# Patient Record
Sex: Female | Born: 1940
Health system: Southern US, Community
[De-identification: ages and names within clinical notes are randomized; demographics above are authoritative.]

## PROBLEM LIST (undated history)

## (undated) ENCOUNTER — Emergency Department (HOSPITAL_COMMUNITY): Admission: EM | Payer: Commercial Managed Care - HMO | Source: Home / Self Care

## (undated) DIAGNOSIS — J449 Chronic obstructive pulmonary disease, unspecified: Secondary | ICD-10-CM

## (undated) DIAGNOSIS — N183 Chronic kidney disease, stage 3 unspecified: Secondary | ICD-10-CM

## (undated) DIAGNOSIS — I482 Chronic atrial fibrillation, unspecified: Secondary | ICD-10-CM

## (undated) DIAGNOSIS — T7840XA Allergy, unspecified, initial encounter: Secondary | ICD-10-CM

## (undated) DIAGNOSIS — T4145XA Adverse effect of unspecified anesthetic, initial encounter: Secondary | ICD-10-CM

## (undated) DIAGNOSIS — J45909 Unspecified asthma, uncomplicated: Secondary | ICD-10-CM

## (undated) DIAGNOSIS — M1990 Unspecified osteoarthritis, unspecified site: Secondary | ICD-10-CM

## (undated) DIAGNOSIS — I10 Essential (primary) hypertension: Secondary | ICD-10-CM

## (undated) DIAGNOSIS — Z8719 Personal history of other diseases of the digestive system: Secondary | ICD-10-CM

## (undated) DIAGNOSIS — E785 Hyperlipidemia, unspecified: Secondary | ICD-10-CM

## (undated) DIAGNOSIS — R011 Cardiac murmur, unspecified: Secondary | ICD-10-CM

## (undated) DIAGNOSIS — K219 Gastro-esophageal reflux disease without esophagitis: Secondary | ICD-10-CM

## (undated) DIAGNOSIS — I700 Atherosclerosis of aorta: Secondary | ICD-10-CM

## (undated) DIAGNOSIS — T8859XA Other complications of anesthesia, initial encounter: Secondary | ICD-10-CM

## (undated) DIAGNOSIS — E162 Hypoglycemia, unspecified: Secondary | ICD-10-CM

## (undated) HISTORY — DX: Allergy, unspecified, initial encounter: T78.40XA

## (undated) HISTORY — DX: Unspecified asthma, uncomplicated: J45.909

## (undated) HISTORY — DX: Chronic obstructive pulmonary disease, unspecified: J44.9

## (undated) HISTORY — DX: Hyperlipidemia, unspecified: E78.5

## (undated) HISTORY — DX: Chronic atrial fibrillation, unspecified: I48.20

## (undated) HISTORY — DX: Atherosclerosis of aorta: I70.0

## (undated) HISTORY — DX: Chronic kidney disease, stage 3 (moderate): N18.3

## (undated) HISTORY — DX: Chronic kidney disease, stage 3 unspecified: N18.30

## (undated) HISTORY — DX: Hypoglycemia, unspecified: E16.2

## (undated) HISTORY — DX: Essential (primary) hypertension: I10

## (undated) HISTORY — PX: TUBAL LIGATION: SHX77

## (undated) HISTORY — PX: CHOLECYSTECTOMY OPEN: SUR202

## (undated) HISTORY — PX: TONSILLECTOMY: SUR1361

---

## 2004-12-26 ENCOUNTER — Encounter: Payer: Self-pay | Admitting: Family Medicine

## 2004-12-26 LAB — CONVERTED CEMR LAB: BUN: 16 mg/dL

## 2004-12-30 ENCOUNTER — Encounter: Payer: Self-pay | Admitting: Family Medicine

## 2007-07-05 ENCOUNTER — Ambulatory Visit: Payer: Self-pay | Admitting: Family Medicine

## 2007-07-05 ENCOUNTER — Encounter: Admission: RE | Admit: 2007-07-05 | Discharge: 2007-07-05 | Payer: Self-pay | Admitting: Family Medicine

## 2007-07-05 DIAGNOSIS — I10 Essential (primary) hypertension: Secondary | ICD-10-CM | POA: Insufficient documentation

## 2007-07-05 DIAGNOSIS — E785 Hyperlipidemia, unspecified: Secondary | ICD-10-CM | POA: Insufficient documentation

## 2007-07-05 DIAGNOSIS — E039 Hypothyroidism, unspecified: Secondary | ICD-10-CM | POA: Insufficient documentation

## 2007-07-06 ENCOUNTER — Telehealth (INDEPENDENT_AMBULATORY_CARE_PROVIDER_SITE_OTHER): Payer: Self-pay | Admitting: *Deleted

## 2007-07-06 ENCOUNTER — Encounter: Payer: Self-pay | Admitting: Family Medicine

## 2007-07-06 LAB — CONVERTED CEMR LAB
ALT: 11 units/L (ref 0–35)
AST: 15 units/L (ref 0–37)
Alkaline Phosphatase: 73 units/L (ref 39–117)
CO2: 21 meq/L (ref 19–32)
Cholesterol: 209 mg/dL — ABNORMAL HIGH (ref 0–200)
Creatinine, Ser: 0.84 mg/dL (ref 0.40–1.20)
HCT: 42.4 % (ref 36.0–46.0)
LDL Cholesterol: 130 mg/dL — ABNORMAL HIGH (ref 0–99)
MCHC: 32.8 g/dL (ref 30.0–36.0)
MCV: 85.8 fL (ref 78.0–100.0)
Platelets: 249 10*3/uL (ref 150–400)
RDW: 13.5 % (ref 11.5–14.0)
Sodium: 143 meq/L (ref 135–145)
Total Bilirubin: 0.5 mg/dL (ref 0.3–1.2)
Total CHOL/HDL Ratio: 4
Total Protein: 6.9 g/dL (ref 6.0–8.3)
VLDL: 27 mg/dL (ref 0–40)

## 2007-07-07 ENCOUNTER — Encounter: Payer: Self-pay | Admitting: Family Medicine

## 2007-07-10 ENCOUNTER — Encounter: Payer: Self-pay | Admitting: Family Medicine

## 2007-07-14 ENCOUNTER — Ambulatory Visit: Payer: Self-pay

## 2007-07-19 ENCOUNTER — Encounter: Payer: Self-pay | Admitting: Family Medicine

## 2007-08-03 ENCOUNTER — Encounter: Payer: Self-pay | Admitting: Family Medicine

## 2007-08-03 DIAGNOSIS — J45909 Unspecified asthma, uncomplicated: Secondary | ICD-10-CM

## 2007-08-03 DIAGNOSIS — K589 Irritable bowel syndrome without diarrhea: Secondary | ICD-10-CM

## 2008-02-15 ENCOUNTER — Encounter: Payer: Self-pay | Admitting: Family Medicine

## 2008-02-15 ENCOUNTER — Ambulatory Visit: Payer: Self-pay | Admitting: Family Medicine

## 2008-02-15 ENCOUNTER — Other Ambulatory Visit: Admission: RE | Admit: 2008-02-15 | Discharge: 2008-02-15 | Payer: Self-pay | Admitting: Family Medicine

## 2008-02-15 DIAGNOSIS — I6529 Occlusion and stenosis of unspecified carotid artery: Secondary | ICD-10-CM

## 2008-02-18 ENCOUNTER — Ambulatory Visit: Payer: Self-pay | Admitting: Family Medicine

## 2008-02-18 DIAGNOSIS — L919 Hypertrophic disorder of the skin, unspecified: Secondary | ICD-10-CM

## 2008-02-18 DIAGNOSIS — L909 Atrophic disorder of skin, unspecified: Secondary | ICD-10-CM | POA: Insufficient documentation

## 2008-02-18 DIAGNOSIS — L821 Other seborrheic keratosis: Secondary | ICD-10-CM

## 2008-02-29 ENCOUNTER — Encounter: Payer: Self-pay | Admitting: Family Medicine

## 2008-02-29 ENCOUNTER — Encounter: Admission: RE | Admit: 2008-02-29 | Discharge: 2008-02-29 | Payer: Self-pay | Admitting: Family Medicine

## 2008-03-02 ENCOUNTER — Encounter: Payer: Self-pay | Admitting: Family Medicine

## 2008-03-08 ENCOUNTER — Telehealth (INDEPENDENT_AMBULATORY_CARE_PROVIDER_SITE_OTHER): Payer: Self-pay | Admitting: *Deleted

## 2008-03-15 ENCOUNTER — Ambulatory Visit: Payer: Self-pay | Admitting: Cardiology

## 2008-03-16 ENCOUNTER — Encounter: Payer: Self-pay | Admitting: Family Medicine

## 2008-03-22 ENCOUNTER — Encounter: Payer: Self-pay | Admitting: Family Medicine

## 2008-04-10 ENCOUNTER — Encounter: Payer: Self-pay | Admitting: Family Medicine

## 2008-04-10 DIAGNOSIS — R0989 Other specified symptoms and signs involving the circulatory and respiratory systems: Secondary | ICD-10-CM

## 2008-04-10 DIAGNOSIS — R0609 Other forms of dyspnea: Secondary | ICD-10-CM

## 2009-03-07 ENCOUNTER — Ambulatory Visit: Payer: Self-pay | Admitting: Family Medicine

## 2009-03-28 ENCOUNTER — Telehealth (INDEPENDENT_AMBULATORY_CARE_PROVIDER_SITE_OTHER): Payer: Self-pay | Admitting: *Deleted

## 2009-03-28 ENCOUNTER — Ambulatory Visit: Payer: Self-pay | Admitting: Family Medicine

## 2009-03-29 LAB — CONVERTED CEMR LAB
Albumin: 4.4 g/dL (ref 3.5–5.2)
Alkaline Phosphatase: 72 units/L (ref 39–117)
BUN: 19 mg/dL (ref 6–23)
Calcium: 9.9 mg/dL (ref 8.4–10.5)
Creatinine, Ser: 0.86 mg/dL (ref 0.40–1.20)
Glucose, Bld: 100 mg/dL — ABNORMAL HIGH (ref 70–99)
HDL: 51 mg/dL (ref >39)
LDL Cholesterol: 101 mg/dL — ABNORMAL HIGH (ref 0–99)
Potassium: 4.4 meq/L (ref 3.5–5.3)
Total CHOL/HDL Ratio: 3.7
Triglycerides: 182 mg/dL — ABNORMAL HIGH (ref <150)

## 2009-06-04 ENCOUNTER — Telehealth (INDEPENDENT_AMBULATORY_CARE_PROVIDER_SITE_OTHER): Payer: Self-pay | Admitting: *Deleted

## 2009-06-05 ENCOUNTER — Telehealth: Payer: Self-pay | Admitting: Family Medicine

## 2009-10-31 ENCOUNTER — Telehealth (INDEPENDENT_AMBULATORY_CARE_PROVIDER_SITE_OTHER): Payer: Self-pay | Admitting: *Deleted

## 2010-04-03 ENCOUNTER — Other Ambulatory Visit: Admission: RE | Admit: 2010-04-03 | Discharge: 2010-04-03 | Payer: Self-pay | Admitting: Family Medicine

## 2010-04-03 ENCOUNTER — Ambulatory Visit: Payer: Self-pay | Admitting: Family Medicine

## 2010-04-03 DIAGNOSIS — M8580 Other specified disorders of bone density and structure, unspecified site: Secondary | ICD-10-CM | POA: Insufficient documentation

## 2010-04-03 DIAGNOSIS — M949 Disorder of cartilage, unspecified: Secondary | ICD-10-CM

## 2010-04-03 DIAGNOSIS — M549 Dorsalgia, unspecified: Secondary | ICD-10-CM

## 2010-04-03 DIAGNOSIS — M899 Disorder of bone, unspecified: Secondary | ICD-10-CM | POA: Insufficient documentation

## 2010-04-04 LAB — CONVERTED CEMR LAB
BUN: 19 mg/dL (ref 6–23)
CO2: 25 meq/L (ref 19–32)
Calcium: 9.4 mg/dL (ref 8.4–10.5)
Chloride: 106 meq/L (ref 96–112)
Cholesterol: 184 mg/dL (ref 0–200)
Creatinine, Ser: 0.98 mg/dL (ref 0.40–1.20)
HDL: 51 mg/dL (ref >39)
Total CHOL/HDL Ratio: 3.6

## 2010-05-15 ENCOUNTER — Ambulatory Visit: Payer: Self-pay

## 2010-05-15 ENCOUNTER — Ambulatory Visit: Payer: Self-pay | Admitting: Cardiovascular Disease

## 2010-05-15 ENCOUNTER — Ambulatory Visit (HOSPITAL_COMMUNITY): Admission: RE | Admit: 2010-05-15 | Discharge: 2010-05-15 | Payer: Self-pay | Admitting: Family Medicine

## 2011-01-22 NOTE — Assessment & Plan Note (Signed)
Summary: CPE with pap   Vital Signs:  Patient profile:   70 year old female Height:      63.25 inches Weight:      172 pounds BMI:     30.34 O2 Sat:      98 % on Room air Pulse rate:   72 / minute BP sitting:   134 / 72  (left arm) Cuff size:   regular  Vitals Entered By: Payton Spark CMA (April 03, 2010 9:33 AM)  O2 Flow:  Room air CC: CPE w/ pap   Primary Care Provider:  Seymour Bars D.O.  CC:  CPE w/ pap.  History of Present Illness: 70 yo WF presents for CPE with pap smear.    She is G5P3, postmenopausal since age 69 s/p 8 yrs of HRT.  She has hx of hypothyroidism, off meds and due for TSH testing.  Hx of HTN, high cholesterol, GERD and chronic back pain.  She has allergies also.  Due for fasting labs and Med RFs today.  Had a colonooscopy in 03, normal but she refuses repeat due to having a tortuous colon and a painful colonoscopy in [place].  She is divorced and working 3 jobs.  She has 'no time' to exercise.  She is due for mammogram and DEXA scan.  She adamantly refuses PNX.  Her Tdap is UTD.     Current Medications (verified): 1)  Amlodipine Besylate 5 Mg  Tabs (Amlodipine Besylate) .... Take 1 Tablet By Mouth Once A Day 2)  Lovastatin 20 Mg  Tabs (Lovastatin) .... Take 1 Tablet By Mouth Once A Day 3)  Omeprazole 20 Mg  Cpdr (Omeprazole) .... Take 1 Tablet By Mouth Once A Day 4)  Mobic 7.5 Mg  Tabs (Meloxicam) .Marland Kitchen.. 1-2 Tabs By Mouth Daily As Needed For Back Pain 5)  Enalapril Maleate 20 Mg Tabs (Enalapril Maleate) .Marland Kitchen.. 1 Tab By Mouth Daily  Allergies (verified): 1)  ! Sulfa 2)  Celebrex (Celecoxib) 3)  Zyrtec Allergy (Cetirizine Hcl)  Past History:  Past Medical History: Reviewed history from 03/22/2008 and no changes required. hypothyroidism  hypertension hyperlipidemia gerd chronic low back pain G5 P3; menopausal since 55; was on HRT for 8 years allergies to cats, dust, mold, grass, trees, flowers, wool, dogs, smoking, sulfa PFTs 2001: FEV1 108% predicted  and FVC 65%; minimal airway obstruction colonoscopy/EGD 8/03 Dr Ilda Mori  [facility]) bicornate uterus  Past Surgical History: Reviewed history from 08/03/2007 and no changes required. cholecystectomy 2001 tonsillectomy childhood  Family History: Reviewed history from 08/03/2007 and no changes required. mother died in her 23s; CAD CHF and valvular disease father died from renal failure after a hip fracture, colon cancer two brothers healthy  Social History: Reviewed history from 03/07/2009 and no changes required. retired Armed forces training and education officer, working at [employer].   divorced.  Not in a relationship.  Three grown sons one here in [place]. Two other sons in [place], [place].  Four grandkids.   never smoked .occasional alcohol  no exercise  Review of Systems       The patient complains of weight gain and dyspnea on exertion.  The patient denies anorexia, fever, weight loss, vision loss, decreased hearing, hoarseness, chest pain, syncope, peripheral edema, prolonged cough, headaches, hemoptysis, abdominal pain, melena, hematochezia, severe indigestion/heartburn, hematuria, incontinence, genital sores, muscle weakness, suspicious skin lesions, transient blindness, difficulty walking, depression, unusual weight change, abnormal bleeding, enlarged lymph nodes, angioedema, breast masses, and testicular masses.    Physical Exam  General:  alert, well-developed, well-nourished, well-hydrated, and overweight-appearing.   Head:  normocephalic and atraumatic.   Eyes:  pupils equal, pupils round, and pupils reactive to light.  wears glasses Ears:  EACs patent; TMs translucent and gray with good cone of light and bony landmarks.  Nose:  no nasal discharge.   Mouth:  good dentition and pharynx pink and moist.   Neck:  no masses.  no audible carotid bruits Breasts:  No mass, nodules, thickening, tenderness, bulging, retraction, inflamation, nipple discharge or skin changes noted.   Lungs:  Normal  respiratory effort, chest expands symmetrically. Lungs are clear to auscultation, no crackles or wheezes. Heart:  Normal rate and regular rhythm. S1 and S2 normal without gallop, murmur, click, rub or other extra sounds. Abdomen:  Bowel sounds positive,abdomen soft and non-tender without masses, organomegaly or hernias noted. Genitalia:  Pelvic Exam:        External: normal female genitalia without lesions or masses        Vagina: normal without lesions or masses        Cervix: normal without lesions or masses        Adnexa: normal bimanual exam without masses or fullness        Uterus: normal by palpation        Pap smear: performed Msk:  no joint tenderness, no joint swelling, no joint warmth, and no redness over joints.   Pulses:  2+ radial and pedal pulses Extremities:  no LE edema Skin:  color normal and no suspicious lesions.   Cervical Nodes:  No lymphadenopathy noted Psych:  good eye contact, not anxious appearing, and not depressed appearing.     Impression & Recommendations:  Problem # 1:  ROUTINE GYNECOLOGICAL EXAMINATION (ICD-V72.31) Thin prep pap done. BP at goal.  BMI >30, into class I obesity. Work on [employer], regular exercise.  Tdap UTD.  Declined pneumovax. Colonsocpy due 2013. Update labs, mammogram and DEXA.  Problem # 2:  DYSPNEA ON EXERTION (ICD-786.09) She c/o DOE when running up 1 flight of stairs but not at any other time.  this is likely to be due to deconditioning.  Her last stress test was in 04 and her risk factors include HTN, high chol and obesity and fam hx. Her updated medication list for this problem includes:    Enalapril Maleate 20 Mg Tabs (Enalapril maleate) .Marland Kitchen... 1 tab by mouth daily  Orders: T-Stress 2-D Echo (60454)  Problem # 3:  BACK PAIN (ICD-724.5) Mobic no longer working so will change her to Etodolac to try. Her back pain is worsened by prolonged standing, lack of exercise and no stretching.  Advised shoe inserts for comfort,  thermacare heat wraps during the day and yoga stretches at night. The following medications were removed from the medication list:    Mobic 7.5 Mg Tabs (Meloxicam) .Marland Kitchen... 1-2 tabs by mouth daily as needed for back pain Her updated medication list for this problem includes:    Etodolac 500 Mg Tabs (Etodolac) .Marland Kitchen... 1 tab by mouth two times a day with food  Complete Medication List: 1)  Amlodipine Besylate 5 Mg Tabs (Amlodipine besylate) .... Take 1 tablet by mouth once a day 2)  Lovastatin 20 Mg Tabs (Lovastatin) .... Take 1 tablet by mouth once a day 3)  Omeprazole 20 Mg Cpdr (Omeprazole) .... Take 1 tablet by mouth once a day 4)  Enalapril Maleate 20 Mg Tabs (Enalapril maleate) .Marland Kitchen.. 1 tab by mouth daily 5)  Loratadine 10 Mg Tabs (Loratadine) .Marland KitchenMarland KitchenMarland Kitchen  Take 1 tab by mouth once daily 6)  Etodolac 500 Mg Tabs (Etodolac) .Marland Kitchen.. 1 tab by mouth two times a day with food  Other Orders: T-Mammography Bilateral Screening (13244) T-Dual DXA Bone Density/ Axial (01027) T-DXA Bone Density/ Appendicular (25366) T-Comprehensive Metabolic Panel [phone]) T-Lipid Profile [phone] T-TSH [phone] T-Vitamin D (25-Hydroxy) (06301-60109)  Patient Instructions: 1)  Meds RFd. 2)  Update labs today. 3)  Will call you w/ results tomorrow. 4)  Will call you w/ pap results by Monday. 5)  Update mammogram and DEXA scan downstairs. 6)  Work on [employer], regular exercise. 7)  Change Ibuprofen and Meloxicam to Etodolac for back pain. 8)  Yoga stretches at night would also be very helpful. 9)  Return for f/u HTN in 6 mos. Prescriptions: ETODOLAC 500 MG TABS (ETODOLAC) 1 tab by mouth two times a day with food  #180 x 0   Entered and Authorized by:   Seymour Bars DO   Signed by:   Seymour Bars DO on 04/03/2010   Method used:   Printed then faxed to ...       [facility]* (retail)       [address] [place], [place]  [address]       Ph: [phone]       Fax: [phone]   RxID:    [id number] ENALAPRIL MALEATE 20 MG TABS (ENALAPRIL MALEATE) 1 tab by mouth daily  #90 x 2   Entered and Authorized by:   Seymour Bars DO   Signed by:   Seymour Bars DO on 04/03/2010   Method used:   Printed then faxed to ...       [facility]* (retail)       [address] [place], [place]  [address]       Ph: [phone]       Fax: [phone]   RxID:   [id number] OMEPRAZOLE 20 MG  CPDR (OMEPRAZOLE) Take 1 tablet by mouth once a day  #90 x 3   Entered and Authorized by:   Seymour Bars DO   Signed by:   Seymour Bars DO on 04/03/2010   Method used:   Printed then faxed to ...       [facility]* (retail)       [address] [place], [place]  [address]       Ph: [phone]       Fax: [phone]   RxID:   [id number] LOVASTATIN 20 MG  TABS (LOVASTATIN) Take 1 tablet by mouth once a day  #90 x 1   Entered and Authorized by:   Seymour Bars DO   Signed by:   Seymour Bars DO on 04/03/2010   Method used:   Printed then faxed to ...       [facility]* (retail)       [address] [place], [place]  [address]       Ph: [phone]       Fax: [phone]   RxID:   [id number] AMLODIPINE BESYLATE 5 MG  TABS (AMLODIPINE BESYLATE) Take 1 tablet by mouth once a day  #90 x 3   Entered and Authorized by:   Seymour Bars DO   Signed by:   Seymour Bars DO on 04/03/2010   Method used:   Printed then faxed to .Marland KitchenMarland Kitchen  [facility]* (retail)       [address] [place], [place]  [address]       Ph: [phone]       Fax: [phone]   RxID:   [id number]

## 2011-04-25 ENCOUNTER — Telehealth: Payer: Self-pay | Admitting: Family Medicine

## 2011-04-25 NOTE — Telephone Encounter (Signed)
LMOM informing Pt that she is due for OV

## 2011-04-25 NOTE — Telephone Encounter (Signed)
Pt called and stated [facility] had sent over a refill request on omeprazole but our office didn't respond therefore they cancelled the order.  Pt needs med.  Can we do today?  Looks like last OV 03-06-11 for her diabetes.  Last RF 08-28-2009 #60/4rfs Plan:  Routed to Dr. Cathey Endow for authorization Jarvis Newcomer, LPN Domingo Dimes

## 2011-04-30 ENCOUNTER — Encounter: Payer: Self-pay | Admitting: Family Medicine

## 2011-04-30 ENCOUNTER — Ambulatory Visit (INDEPENDENT_AMBULATORY_CARE_PROVIDER_SITE_OTHER): Payer: Self-pay | Admitting: Family Medicine

## 2011-04-30 DIAGNOSIS — I499 Cardiac arrhythmia, unspecified: Secondary | ICD-10-CM

## 2011-04-30 DIAGNOSIS — E039 Hypothyroidism, unspecified: Secondary | ICD-10-CM

## 2011-04-30 DIAGNOSIS — I10 Essential (primary) hypertension: Secondary | ICD-10-CM

## 2011-04-30 DIAGNOSIS — E785 Hyperlipidemia, unspecified: Secondary | ICD-10-CM

## 2011-04-30 DIAGNOSIS — Z13220 Encounter for screening for lipoid disorders: Secondary | ICD-10-CM

## 2011-04-30 LAB — LIPID PANEL
HDL: 50 mg/dL (ref >39)
Total CHOL/HDL Ratio: 3.9 Ratio
Triglycerides: 191 mg/dL — ABNORMAL HIGH (ref <150)
VLDL: 38 mg/dL (ref 0–40)

## 2011-04-30 LAB — CBC WITH DIFFERENTIAL/PLATELET
Basophils Absolute: 0.1 10*3/uL (ref 0.0–0.1)
Basophils Relative: 1 % (ref 0–1)
HCT: 40.9 % (ref 36.0–46.0)
MCHC: 33.7 g/dL (ref 30.0–36.0)
Monocytes Absolute: 0.5 10*3/uL (ref 0.1–1.0)
Neutro Abs: 4.1 10*3/uL (ref 1.7–7.7)
RDW: 14.4 % (ref 11.5–15.5)

## 2011-04-30 MED ORDER — OMEPRAZOLE 20 MG PO CPDR: 20.0000 mg | DELAYED_RELEASE_CAPSULE | Freq: Every day | ORAL | Status: DC

## 2011-04-30 MED ORDER — AMLODIPINE BESYLATE 5 MG PO TABS: 5.0000 mg | ORAL_TABLET | Freq: Every day | ORAL | Status: DC

## 2011-04-30 MED ORDER — ENALAPRIL MALEATE 20 MG PO TABS: 20.0000 mg | ORAL_TABLET | Freq: Every day | ORAL | Status: DC

## 2011-04-30 MED ORDER — MELOXICAM 15 MG PO TABS: 15.0000 mg | ORAL_TABLET | Freq: Every day | ORAL | Status: DC

## 2011-04-30 MED ORDER — LOVASTATIN 20 MG PO TABS: 20.0000 mg | ORAL_TABLET | Freq: Every day | ORAL | Status: DC

## 2011-04-30 MED ORDER — MELOXICAM 15 MG PO TABS: 15.0000 mg | ORAL_TABLET | Freq: Every day | ORAL | Status: AC

## 2011-04-30 NOTE — Assessment & Plan Note (Signed)
Check TSH with labs today and f/u results tomorrow.

## 2011-04-30 NOTE — Patient Instructions (Signed)
EKG shows a sinus arrythmia. Will get you set up to see cardiologist.  Update fasting labs today. Stay on current meds.  Will call you w/ lab results tomorrow.  Return for your BP in 4 months.

## 2011-04-30 NOTE — Progress Notes (Signed)
Addended by: Seymour Bars on: 04/30/2011 12:47 PM   Modules accepted: Orders

## 2011-04-30 NOTE — Progress Notes (Signed)
  Subjective:    Patient ID: Rebekah Harrell, female    DOB: 12/28/1940, 70 y.o.   MRN: [id number]  HPI  70 yo WF presents for f/u visit.  She is due for fasting labs today.  Denies chest pain or SOB.  She is working out in the yard and stays active.  On meloxicam for LBP.  She has been monitoring her BP at home and it has been <140/90.  Denies edema or heart palpitations.  Has a fair diet.    BP 156/76  Pulse 72  Ht 5' 3.25" (1.607 m)  Wt 172 lb (78.019 kg)  BMI 30.23 kg/m2  SpO2 96%  Patient Active Problem List  Diagnoses  . HYPOTHYROIDISM NOS  . HYPERLIPIDEMIA NEC/NOS  . HYPERTENSION, BENIGN ESSENTIAL  . CAROTID ARTERY STENOSIS, BILATERAL  . ASTHMA, INTERMITTENT, MILD  . IBS  . SKIN TAG  . SEBORRHEIC KERATOSIS  . BACK PAIN  . OSTEOPENIA  . DYSPNEA ON EXERTION        Review of Systems  Constitutional: Negative for fever, fatigue and unexpected weight change.  Respiratory: Negative for chest tightness and shortness of breath.   Cardiovascular: Negative for chest pain.  Gastrointestinal: Negative for nausea and diarrhea.  Neurological: Negative for dizziness, weakness, light-headedness and headaches.  Psychiatric/Behavioral: Negative for dysphoric mood. The patient is not nervous/anxious.        Objective:   Physical Exam  Eyes: Pupils are equal, round, and reactive to light.  Neck: Neck supple. No JVD present. No thyromegaly present.  Cardiovascular: Normal rate, S1 normal and S2 normal.  An irregular rhythm present.  No murmur heard. Musculoskeletal: She exhibits no edema.  Lymphadenopathy:    She has no cervical adenopathy.  Skin: Skin is warm and dry.  Psychiatric: She has a normal mood and affect.          Assessment & Plan:

## 2011-04-30 NOTE — Assessment & Plan Note (Signed)
BP elevated today on current meds but she reports it to be 'normal' at home.  No changes made today.  Will update fasting labs today, RF meds and have cards see her for arrythmia / BP.

## 2011-04-30 NOTE — Assessment & Plan Note (Signed)
New.  Asymptomatic and hemodynamically stable.  EKG today shows a HR of 69 bpm with premature atrial contractions.  Will get her in with cardiology for eval/ treatment and check electrolytes with labs today.

## 2011-05-01 ENCOUNTER — Telehealth: Payer: Self-pay | Admitting: Family Medicine

## 2011-05-01 LAB — COMPLETE METABOLIC PANEL WITH GFR
Albumin: 4.6 g/dL (ref 3.5–5.2)
CO2: 23 mEq/L (ref 19–32)
Chloride: 105 mEq/L (ref 96–112)
GFR, Est African American: >60 mL/min (ref >60)
GFR, Est Non African American: >60 mL/min (ref >60)
Glucose, Bld: 90 mg/dL (ref 70–99)
Potassium: 4.3 mEq/L (ref 3.5–5.3)
Sodium: 141 mEq/L (ref 135–145)
Total Protein: 7.3 g/dL (ref 6.0–8.3)

## 2011-05-01 NOTE — Telephone Encounter (Signed)
Pls let pt know that her fasting sugar, liver and kidney function came back normal.  Blood counts are normal.  Cholesterol at goal other than elevated TGs at 191 (<150 is goal).  Healthy diet and regular exercise will help get her to goal.  Thyroid function is perfect.  Continue current meds.  Repeat in 1 yr.

## 2011-05-01 NOTE — Telephone Encounter (Signed)
Pt aware of the above  

## 2011-05-06 NOTE — Assessment & Plan Note (Signed)
[facility]                            CARDIOLOGY OFFICE NOTE   MAYDA, SHIPPEE                       MRN:          [id number]  DATE:03/15/2008                            DOB:          [birth year]    Rebekah Harrell is a very pleasant 70 year old female who I am asked to  evaluate for dyspnea, risk factors, palpitations, and cerebrovascular  disease.  Note ,she has no prior cardiac history.  She was told she had  a murmur as a child, but this has not been mentioned in years.  She  typically has dyspnea with more extreme activities but not with routine  activities.  There is no orthopnea, PND, pedal edema, presyncope,  syncope, or exertional chest pain.  She also occasionally feels  palpitations, and this has been going on intermittently for 25 years.  They are sudden in onset and described as her heart racing.  They last  for 10-15 seconds and resolve spontaneously.  There are not associated  with activities, and there is no associated shortness of breath,  presyncope, or syncope and no chest pain.  She also was noted to have a  history of cerebrovascular disease.  Carotid Dopplers on July 14, 2007  showed 40-59% internal carotid artery stenosis on the right as well as  the left.  Follow-up was recommended in one year.   SHE HAD AN ALLERGY TO SULFA.   MEDICATIONS:  Include:  1. Omeprazole 20 mg p.o. daily.  2. Lovastatin 20 mg p.o. daily.  3. Amlodipine 5 mg p.o. daily.  4. Meloxicam 7.5 mg p.o. daily.  5. Calcium plus D.  6. Allergy capsules.  7. Multivitamin.  8. She also takes Flonase and albuterol as needed.   SOCIAL HISTORY:  She does not smoke and only rarely consumes alcohol.   FAMILY HISTORY:  Is negative for coronary disease at an early age in her  immediate family.   PAST MEDICAL HISTORY:  Significant for hypertension and hyperlipidemia.  There is no diabetes mellitus.  She does have seasonal allergies and  asthma.  She has a  history of a hiatal hernia and irritable bowel  syndrome.  She has a history of lower back arthritis.  She has had a  prior cholecystectomy and tonsillectomy.   REVIEW OF SYSTEMS:  She denies any headaches or fevers or chills.  There  is no productive cough or hemoptysis.  There is no dysphagia,  odynophagia, melena or hematochezia.  There is no dysuria, hematuria.  No rash or seizure activity.  There is no orthopnea, PND, or pedal  edema.  There is no claudication noted.  The remainder of systems are  negative.   PHYSICAL EXAMINATION:  Today shows a blood pressure of 148/79 and a  pulse 72.  She weighs 169 pounds.  She is well-developed and well-  nourished in no acute distress.  SKIN:  Is warm, dry.  She does not appear to be depressed, and there is no peripheral  clubbing.  Her HEENT is normal with normal eyelids.  Her neck is supple with no bruits.  There is  no thyromegaly.  No jugular  venous distention is noted.  CHEST:  Is clear to auscultation.  Normal expansion.  CARDIOVASCULAR EXAM:  Reveals a regular rate and rhythm.  Normal S1-S2.  There are no murmurs, rubs, or gallops noted.  ABDOMINAL EXAM:  Nontender.  Nondistended.  Positive bowel sounds.  No  hepatosplenomegaly.  No masses appreciated.  There is no abdominal  bruit.  She has 2+ femoral pulses bilaterally with no bruits.  EXTREMITIES:  Show no edema, and I can palpate no cords.  She has 2+  dorsalis pedis pulses bilaterally.  NEUROLOGIC EXAM:  Grossly intact.   Her electrocardiogram shows a sinus rhythm at a rate of 70.  There are  no significant ST changes.   DIAGNOSES:  1. Dyspnea on exertion - note, her electrocardiogram is normal.  She      does have risk factors of hypertension as well as hyperlipidemia.      She also has mild to moderate cerebrovascular disease bilaterally      on previous carotid Dopplers.  She is also contemplating starting      an exercise program.  I think it would be worthwhile to  proceed      with a stress echocardiogram, both for risk stratification and to      quantify LV function.  If it shows no significant abnormalities,      then she will continue risk factor modification.  2. Palpitations - she has had palpitations intermittently for 20-25      years.  These do not appear to be significantly limiting, and there      are occurring only every several months.  If they worsen in the      future, then we will consider a CardioNet monitor at that time.  3. Cerebrovascular disease - she will need follow-up carotid Dopplers      in July of this year, and I will leave this to Dr. Cathey Endow.  I have      asked her to take enteric-coated aspirin 81 mg p.o. daily given her      documented vascular disease.  She will continue on her statin.  The      goal LDL should be less than 70 given her history of vascular      disease.  4. Hypertension - her blood pressure is minimally elevated today.      This will need to be tracked, and her amlodipine can be increased      as needed.  5. History of hyperlipidemia - she will continue her lovastatin.      Again, her goal LDL should be less than 70, and I will leave these      issues to Dr. Cathey Endow.  6. We will see her back on as-needed basis if her stress      echocardiogram is normal.     Rebekah Harrell. Jens Som, MD, [facility]  Electronically Signed    BSC/MedQ  DD: 03/15/2008  DT: 03/16/2008  Job #: [id number]   cc:   Seymour Bars, D.O.

## 2011-05-21 ENCOUNTER — Encounter: Payer: Self-pay | Admitting: Family Medicine

## 2011-06-24 ENCOUNTER — Telehealth: Payer: Self-pay | Admitting: Family Medicine

## 2011-06-24 NOTE — Telephone Encounter (Signed)
Pls let pt know that her 2D echocardiogram of her heart came back normal.  Valves and pumping function look perfect.

## 2011-06-24 NOTE — Telephone Encounter (Signed)
Pt aware of the above  

## 2012-04-14 ENCOUNTER — Telehealth: Payer: Self-pay | Admitting: *Deleted

## 2012-04-14 NOTE — Telephone Encounter (Signed)
Pt called and states she applied a face cream to her face before bedtime and she woke up with a rash on her face and her eyes were swollen and left eye was swollen shut. Pt states she took several anti histamines throught out the day and states she called her job and they advised her to take Benedryl. Pt wanted to know if there was anything else we could recommend of if there was anything stronger than Benedryl. Advised pt that Benedryl would be first option but she needs to be seen in the office for further eval to make sure nothing else is needed. Denies tongue or throat swelling or difficulty breathing. Pt declines to come in  and states she will try the Benedryl.Advised to call back if rash doesn't go away and if difficulty breathing go to ED

## 2012-04-27 ENCOUNTER — Other Ambulatory Visit: Payer: Self-pay | Admitting: *Deleted

## 2012-04-27 MED ORDER — OMEPRAZOLE 20 MG PO CPDR: 20.0000 mg | DELAYED_RELEASE_CAPSULE | Freq: Every day | ORAL | Status: DC

## 2012-05-03 ENCOUNTER — Ambulatory Visit (INDEPENDENT_AMBULATORY_CARE_PROVIDER_SITE_OTHER): Payer: 59 | Admitting: Physician Assistant

## 2012-05-03 ENCOUNTER — Encounter: Payer: Self-pay | Admitting: Physician Assistant

## 2012-05-03 VITALS — BP 143/84 | HR 86 | Ht 63.25 in | Wt 175.0 lb

## 2012-05-03 DIAGNOSIS — E785 Hyperlipidemia, unspecified: Secondary | ICD-10-CM

## 2012-05-03 DIAGNOSIS — Z79899 Other long term (current) drug therapy: Secondary | ICD-10-CM

## 2012-05-03 DIAGNOSIS — I10 Essential (primary) hypertension: Secondary | ICD-10-CM

## 2012-05-03 DIAGNOSIS — T7840XA Allergy, unspecified, initial encounter: Secondary | ICD-10-CM

## 2012-05-03 DIAGNOSIS — K219 Gastro-esophageal reflux disease without esophagitis: Secondary | ICD-10-CM

## 2012-05-03 NOTE — Patient Instructions (Addendum)
Will get labs and call with results. Will refill medications today. Continue zyrtec daily and benadryl as needed. Consider Allergy testing.

## 2012-05-03 NOTE — Progress Notes (Signed)
  Subjective:    Patient ID: Rebekah Harrell, female    DOB: 28-Jan-1941, 71 y.o.   MRN: [id number]  HPI Patient presents to clinic for medication refill for HTN, Hyperlipidemia, and GERD. Patient has been doing great. She has had a recent allergic reaction to bath and body lotion. She used benadryl and it has improved. She feels like her allergies and allergic response has gotten more sensitive in the last year. Her eyes still remain puffy from contact with cream from bath and body.    Review of Systems     Objective:   Physical Exam  Constitutional: She is oriented to person, place, and time. She appears well-developed and well-nourished.  HENT:  Head: Normocephalic and atraumatic.  Right Ear: External ear normal.  Left Ear: External ear normal.  Nose: Nose normal.  Mouth/Throat: Oropharynx is clear and moist.  Neck: Normal range of motion. Neck supple. No thyromegaly present.  Cardiovascular: Normal rate, regular rhythm and normal heart sounds.   Pulmonary/Chest: Effort normal and breath sounds normal. She has no wheezes.  Lymphadenopathy:    She has no cervical adenopathy.  Neurological: She is alert and oriented to person, place, and time.  Skin: Skin is warm and dry.  Psychiatric: She has a normal mood and affect. Her behavior is normal.          Assessment & Plan:  Hyperlipidemia/HTN/GERD- Rechecked blood pressure and was 130/69. Sent for labs for management of medications will call with results. [facility] once get lab results. Will switch statin due to formulary change with ins.  Allergic reaction- Encouraged patient to continue to use bendaryl for all allergic reactions. May consider going on daily zyrtec or claritin. May need allergy testing to see if allergy shots would benefit her. Educated patient to call office or go to ED if allergic reactions causes SOB or problems swallowing. Offered allergic eye drops but patient did not want.

## 2012-05-04 LAB — CBC WITH DIFFERENTIAL/PLATELET
Basophils Relative: 1 % (ref 0–1)
Eosinophils Absolute: 0.4 10*3/uL (ref 0.0–0.7)
Eosinophils Relative: 5 % (ref 0–5)
HCT: 41.2 % (ref 36.0–46.0)
Hemoglobin: 13.6 g/dL (ref 12.0–15.0)
MCH: 28.1 pg (ref 26.0–34.0)
MCHC: 33 g/dL (ref 30.0–36.0)
MCV: 85.1 fL (ref 78.0–100.0)
Monocytes Absolute: 0.5 10*3/uL (ref 0.1–1.0)
Monocytes Relative: 6 % (ref 3–12)
Neutro Abs: 5 10*3/uL (ref 1.7–7.7)
RDW: 13.6 % (ref 11.5–15.5)

## 2012-05-04 LAB — COMPLETE METABOLIC PANEL WITH GFR
ALT: 20 U/L (ref 0–35)
AST: 28 U/L (ref 0–37)
Albumin: 4.3 g/dL (ref 3.5–5.2)
Alkaline Phosphatase: 75 U/L (ref 39–117)
Potassium: 4.1 mEq/L (ref 3.5–5.3)
Sodium: 142 mEq/L (ref 135–145)
Total Bilirubin: 0.4 mg/dL (ref 0.3–1.2)
Total Protein: 6.9 g/dL (ref 6.0–8.3)

## 2012-05-04 LAB — LIPID PANEL
HDL: 41 mg/dL (ref >39)
LDL Cholesterol: 106 mg/dL — ABNORMAL HIGH (ref 0–99)
Total CHOL/HDL Ratio: 5 Ratio

## 2012-05-04 LAB — TSH: TSH: 2.508 u[IU]/mL (ref 0.350–4.500)

## 2012-05-05 ENCOUNTER — Telehealth: Payer: Self-pay | Admitting: *Deleted

## 2012-05-05 MED ORDER — AMLODIPINE BESYLATE 5 MG PO TABS: 5.0000 mg | ORAL_TABLET | Freq: Every day | ORAL | Status: DC

## 2012-05-05 MED ORDER — SIMVASTATIN 20 MG PO TABS: 20.0000 mg | ORAL_TABLET | Freq: Every evening | ORAL | Status: DC

## 2012-05-05 MED ORDER — MELOXICAM 15 MG PO TABS: 15.0000 mg | ORAL_TABLET | Freq: Every day | ORAL | Status: DC

## 2012-05-05 MED ORDER — OMEPRAZOLE 20 MG PO CPDR: 20.0000 mg | DELAYED_RELEASE_CAPSULE | Freq: Every day | ORAL | Status: DC

## 2012-05-05 MED ORDER — SIMVASTATIN 40 MG PO TABS: 40.0000 mg | ORAL_TABLET | Freq: Every evening | ORAL | Status: DC

## 2012-05-05 MED ORDER — ENALAPRIL MALEATE 20 MG PO TABS: 20.0000 mg | ORAL_TABLET | Freq: Every day | ORAL | Status: DC

## 2012-05-05 NOTE — Progress Notes (Signed)
Addended by: Ellsworth Lennox on: 05/05/2012 10:49 AM   Modules accepted: Orders

## 2012-05-05 NOTE — Telephone Encounter (Signed)
Pt to take simvastatin 20

## 2012-05-21 ENCOUNTER — Telehealth: Payer: Self-pay | Admitting: *Deleted

## 2012-05-21 ENCOUNTER — Other Ambulatory Visit: Payer: Self-pay | Admitting: Physician Assistant

## 2012-05-21 MED ORDER — SIMVASTATIN 20 MG PO TABS: 40.0000 mg | ORAL_TABLET | Freq: Every evening | ORAL | Status: DC

## 2012-05-21 MED ORDER — SIMVASTATIN 40 MG PO TABS: 40.0000 mg | ORAL_TABLET | Freq: Every evening | ORAL | Status: DC

## 2012-05-21 NOTE — Telephone Encounter (Signed)
I sent new rx. I tried to find out exactly what went wrong. But I did want to increase to 40mg  daily. She can take 2 of what she has until finished. Follow up in 6 months.

## 2012-05-21 NOTE — Telephone Encounter (Signed)
Pt states that she was told to increase her cholesterol med to 40mg  daily. States when she received the new rx it states for her to take 20mg  daily. Please advise what pt should be doing.

## 2012-05-21 NOTE — Telephone Encounter (Signed)
Pt informed

## 2012-05-28 ENCOUNTER — Telehealth: Payer: Self-pay | Admitting: Physician Assistant

## 2012-05-28 MED ORDER — SIMVASTATIN 20 MG PO TABS: 20.0000 mg | ORAL_TABLET | Freq: Every evening | ORAL | Status: DC

## 2012-05-28 NOTE — Telephone Encounter (Signed)
Clarified with patient that she needs to be on Zocor 20mg  because in combination with Norvasc.

## 2012-07-20 ENCOUNTER — Ambulatory Visit (INDEPENDENT_AMBULATORY_CARE_PROVIDER_SITE_OTHER): Payer: 59 | Admitting: Family Medicine

## 2012-07-20 ENCOUNTER — Encounter: Payer: Self-pay | Admitting: Family Medicine

## 2012-07-20 VITALS — BP 142/83 | HR 99 | Wt 173.0 lb

## 2012-07-20 DIAGNOSIS — W57XXXA Bitten or stung by nonvenomous insect and other nonvenomous arthropods, initial encounter: Secondary | ICD-10-CM

## 2012-07-20 DIAGNOSIS — T148XXA Other injury of unspecified body region, initial encounter: Secondary | ICD-10-CM

## 2012-07-20 DIAGNOSIS — S1096XA Insect bite of unspecified part of neck, initial encounter: Secondary | ICD-10-CM

## 2012-07-20 MED ORDER — DOXYCYCLINE HYCLATE 100 MG PO TABS: 100.0000 mg | ORAL_TABLET | Freq: Two times a day (BID) | ORAL | Status: AC

## 2012-07-20 NOTE — Patient Instructions (Signed)
Deer Tick Bite  Deer ticks are brown arachnids (spider family) that vary in size from as small as the head of a pin to 1/4 inch (1/2 cm) diameter. They thrive in wooded areas. Deer are the preferred host of adult deer ticks. Small rodents are the host of young ticks (nymphs). When a person walks in a field or wooded area, young and adult ticks in the surrounding grass and vegetation can attach themselves to the skin. They can suck blood for hours to days if unnoticed. Ticks are found all over the [place]  Some ticks carry a specific bacteria (Borrelia burgdorferi) that causes an infection called Lyme disease. The bacteria is typically passed into a person during the blood sucking process. This happens after the tick has been attached for at least a number of hours. While ticks can be found all over the [place] those carrying the bacteria that causes Lyme disease are most common in [place] and the [place]. Only a small proportion of ticks in these areas carry the Lyme disease bacteria and cause human infections.  Ticks usually attach to warm spots on the body, such as the:   Head.   Back.   Neck.   Armpits.   Groin.  SYMPTOMS   Most of the time, a deer tick bite will not be felt. You may or may not see the attached tick. You may notice mild irritation or redness around the bite site. If the deer tick passes the Lyme disease bacteria to a person, a round, red rash may be noticed 2 to 3 days after the bite. The rash may be clear in the middle, like a bull's-eye or target.  If not treated, other symptoms may develop several days to weeks after the onset of the rash. These symptoms may include:   New rash lesions.   Fatigue and weakness.   General ill feeling and achiness.   Chills.   Headache and neck pain.   Swollen lymph glands.   Sore muscles and joints.  5 to 15% of untreated people with Lyme disease may develop more severe illnesses after several weeks to months. This may include inflammation of the  brain lining (meningitis), nerve palsies, an abnormal heartbeat, or severe muscle and joint pain and inflammation (myositis or arthritis).  DIAGNOSIS    Physical exam and medical history.   Viewing the tick if it was saved for confirmation.   Blood tests (to check or confirm the presence of Lyme disease).  TREATMENT   Most ticks do not carry disease. If found, an attached tick should be removed using tweezers. Tweezers should be placed under the body of the tick so it is removed by its attachment parts (pincers).  If there are signs or symptoms of being sick, or Lyme disease is confirmed, medicines (antibiotics) that kill germs are usually prescribed. In more severe cases, antibiotics may be given through an intravenous (IV) access.  HOME CARE INSTRUCTIONS    Always remove ticks with tweezers. Do not use petroleum jelly or other methods to kill or remove the tick. Slide the tweezers under the body and pull out as much as you can. If you are not sure what it is, save it in a jar and show your caregiver.   Once you remove the tick, the skin will heal on its own. Wash your hands and the affected area with water and soap. You may place a bandage on the affected area.   Take medicine as directed. You may   be advised to take a full course of antibiotics.   Follow up with your caregiver as recommended.  FINDING OUT THE RESULTS OF YOUR TEST  Not all test results are available during your visit. If your test results are not back during the visit, make an appointment with your caregiver to find out the results. Do not assume everything is normal if you have not heard from your caregiver or the medical facility. It is important for you to follow up on all of your test results.  PROGNOSIS   If Lyme disease is confirmed, early treatment with antibiotics is very effective. Following preventive guidelines is important since it is possible to get the disease more than once.  PREVENTION    Wear long sleeves and long pants in  wooded or grassy areas. Tuck your pants into your socks.   Use an insect repellent while hiking.   Check yourself, your children, and your pets regularly for ticks after playing outside.   Clear piles of leaves or brush from your yard. Ticks might live there.  SEEK MEDICAL CARE IF:    You or your child has an oral temperature above 102 F (38.9 C).   You develop a severe headache following the bite.   You feel generally ill.   You notice a rash.   You are having trouble removing the tick.   The bite area has red skin or yellow drainage.  SEEK IMMEDIATE MEDICAL CARE IF:    Your face is weak and droopy or you have other neurological symptoms.   You have severe joint pain or weakness.  MAKE SURE YOU:    Understand these instructions.   Will watch your condition.   Will get help right away if you are not doing well or get worse.  FOR MORE INFORMATION  Centers for Disease Control and Prevention: www.cdc.gov  American Academy of Family Physicians: www.aafp.org  Document Released: 03/04/2010 Document Revised: 11/27/2011 Document Reviewed: 03/04/2010  ExitCare Patient Information 2012 [employer].

## 2012-07-20 NOTE — Progress Notes (Signed)
  Subjective:    Patient ID: Rebekah Harrell, female    DOB: 05-16-1941, 71 y.o.   MRN: [id number]  HPI Was bite by a tick a couple of days ago and removed it from her right ear. Also a spot on her left shoulder but not sure also a tick bite.  "Says start some old clindamycin and took 3 yesterday. Says the swollen gland behind her right ear got better.  No fever. No rash.     Review of Systems     Objective:   Physical Exam  Constitutional: She appears well-developed and well-nourished.  HENT:  Head: Normocephalic and atraumatic.  Skin: Skin is warm and dry.       No skin changes on the right ear. Mildly swollen preauricular LN and swollen just below the ear along the neck.  On the left shoulder has a more crater like lesion that has a black center and about 0.5 cm surrounding erythema. Small scab in the center as well. No active drainage.   Psychiatric: She has a normal mood and affect. Her behavior is normal.          Assessment & Plan:  Tick Bite - no signs to indicate that this is a type of Lyme's disease. Acute reaction locally at the ear. I explained this is a normal healthy immune response. No further treatment needed for the actual tick bite.  2nd bite that has some surrounding cellulitis - this definitely does not look like a classic tick bite. This may have been some other type of insect bite. It does have some surrounding redness which could be just an acute reaction versus early cellulitis. She's very been taking clindamycin which confuses the picture slightly. I will go ahead and treat her with 10 days of doxycycline. Asked her to keep an eye on it. If not resolving or she feels the area is getting larger then please call the office back.

## 2012-07-21 ENCOUNTER — Ambulatory Visit: Payer: 59 | Admitting: Family Medicine

## 2012-09-23 ENCOUNTER — Telehealth: Payer: Self-pay | Admitting: Family Medicine

## 2012-09-23 NOTE — Telephone Encounter (Signed)
LMOM informing Pt  

## 2012-09-23 NOTE — Telephone Encounter (Signed)
Please call patient on her to get a yearly eye exam for glaucoma screening. If she can have the eye Dr. send [place] a copy of the report. It's recommended for all patients over 65. Also need to make sure that her chart is abstracted because it looks like it has not.

## 2012-11-03 ENCOUNTER — Other Ambulatory Visit: Payer: Self-pay | Admitting: Family Medicine

## 2013-06-22 ENCOUNTER — Telehealth: Payer: Self-pay | Admitting: Family Medicine

## 2013-06-22 NOTE — Telephone Encounter (Signed)
Call patient: She is overdue for her mammogram. Her last was in 2009 also overdue for her colonoscopy. Her last was in 2004. Please let me know if she is okay with scheduling either one of these. We can do the mammogram downstairs in our office and a colonoscopy can be done here locally and [facility] if she is okay with that. She does not want to have a colonoscopy then we can have her come and pick up stool cards as an alternative.

## 2013-06-29 NOTE — Telephone Encounter (Signed)
LMOM for pt to return call. 

## 2013-07-01 NOTE — Telephone Encounter (Signed)
Ok to mail letter.

## 2013-07-04 ENCOUNTER — Encounter: Payer: Self-pay | Admitting: *Deleted

## 2013-07-04 NOTE — Telephone Encounter (Signed)
Letters sent.Rebekah Harrell Rebekah Harrell mammo and colonoscopy

## 2013-07-07 ENCOUNTER — Encounter: Payer: Self-pay | Admitting: *Deleted

## 2013-07-07 ENCOUNTER — Telehealth: Payer: Self-pay | Admitting: *Deleted

## 2013-07-07 DIAGNOSIS — Z1211 Encounter for screening for malignant neoplasm of colon: Secondary | ICD-10-CM

## 2013-07-07 DIAGNOSIS — Z1231 Encounter for screening mammogram for malignant neoplasm of breast: Secondary | ICD-10-CM

## 2013-07-07 NOTE — Telephone Encounter (Signed)
Pt called back to re her over due mammo & colonoscopy.  She is ok with [place] ordering the mammo for downstairs but she states that her colonoscopy in 2004 was unsuccessful due to how her colon is & the GI dr there told her that all future colonoscopies would have to be done by her swallowing the camera pill but pt is ok with a referral for a GI here.

## 2013-07-08 NOTE — Telephone Encounter (Signed)
Referrals placed 

## 2013-07-18 ENCOUNTER — Encounter: Payer: Self-pay | Admitting: Physician Assistant

## 2013-07-18 ENCOUNTER — Ambulatory Visit (INDEPENDENT_AMBULATORY_CARE_PROVIDER_SITE_OTHER): Payer: 59 | Admitting: Physician Assistant

## 2013-07-18 ENCOUNTER — Other Ambulatory Visit (HOSPITAL_COMMUNITY)
Admission: RE | Admit: 2013-07-18 | Discharge: 2013-07-18 | Disposition: A | Discharge status: Home / Self Care | Payer: Medicare PPO | Source: Ambulatory Visit | Attending: Family Medicine | Admitting: Family Medicine

## 2013-07-18 VITALS — BP 135/77 | HR 71 | Wt 163.0 lb

## 2013-07-18 DIAGNOSIS — Z01419 Encounter for gynecological examination (general) (routine) without abnormal findings: Secondary | ICD-10-CM | POA: Insufficient documentation

## 2013-07-18 DIAGNOSIS — Z0000 Encounter for general adult medical examination without abnormal findings: Secondary | ICD-10-CM

## 2013-07-18 DIAGNOSIS — Z13820 Encounter for screening for osteoporosis: Secondary | ICD-10-CM

## 2013-07-18 DIAGNOSIS — Z1239 Encounter for other screening for malignant neoplasm of breast: Secondary | ICD-10-CM

## 2013-07-18 DIAGNOSIS — Z1151 Encounter for screening for human papillomavirus (HPV): Secondary | ICD-10-CM | POA: Insufficient documentation

## 2013-07-18 DIAGNOSIS — Z13220 Encounter for screening for lipoid disorders: Secondary | ICD-10-CM

## 2013-07-18 DIAGNOSIS — Z131 Encounter for screening for diabetes mellitus: Secondary | ICD-10-CM

## 2013-07-18 DIAGNOSIS — I10 Essential (primary) hypertension: Secondary | ICD-10-CM

## 2013-07-18 LAB — COMPLETE METABOLIC PANEL WITH GFR
BUN: 17 mg/dL (ref 6–23)
CO2: 28 mEq/L (ref 19–32)
Calcium: 10 mg/dL (ref 8.4–10.5)
Creat: 0.96 mg/dL (ref 0.50–1.10)
GFR, Est African American: 69 mL/min
GFR, Est Non African American: 60 mL/min
Glucose, Bld: 85 mg/dL (ref 70–99)
Total Bilirubin: 0.5 mg/dL (ref 0.3–1.2)

## 2013-07-18 LAB — LIPID PANEL
Cholesterol: 182 mg/dL (ref 0–200)
HDL: 53 mg/dL (ref >39)
Triglycerides: 182 mg/dL — ABNORMAL HIGH (ref <150)

## 2013-07-18 MED ORDER — MELOXICAM 15 MG PO TABS: 15.0000 mg | ORAL_TABLET | Freq: Every day | ORAL | Status: DC

## 2013-07-18 MED ORDER — AMLODIPINE BESYLATE 5 MG PO TABS: 5.0000 mg | ORAL_TABLET | Freq: Every day | ORAL | Status: DC

## 2013-07-18 MED ORDER — ENALAPRIL MALEATE 20 MG PO TABS: 20.0000 mg | ORAL_TABLET | Freq: Every day | ORAL | Status: DC

## 2013-07-18 NOTE — Patient Instructions (Addendum)
Will get mammogram and bone density. Will call with lab results.

## 2013-07-20 NOTE — Progress Notes (Signed)
  Subjective:     Rebekah Harrell is a 72 y.o. female and is here for a comprehensive physical exam. The patient reports no problems.  History   Social History  . Marital Status: Divorced    Spouse Name: N/A    Number of Children: N/A  . Years of Education: N/A   Occupational History  . Not on file.   Social History Main Topics  . Smoking status: Never Smoker   . Smokeless tobacco: Not on file  . Alcohol Use: Not on file  . Drug Use: Not on file  . Sexually Active: Not on file   Other Topics Concern  . Not on file   Social History Narrative  . No narrative on file   Health Maintenance  Topic Date Due  . Colonoscopy  12/22/2012  . Influenza Vaccine  08/22/2013  . Tetanus/tdap  05/03/2022  . Pneumococcal Polysaccharide Vaccine Age 59 And Over  Addressed  . Zostavax  Addressed    The following portions of the patient's history were reviewed and updated as appropriate: allergies, current medications, past family history, past medical history, past social history, past surgical history and problem list.  Review of Systems A comprehensive review of systems was negative.   Objective:    BP 135/77  Pulse 71  Wt 163 lb (73.936 kg)  BMI 28.63 kg/m2 General appearance: alert, cooperative and appears stated age Head: Normocephalic, without obvious abnormality, atraumatic Eyes: conjunctivae/corneas clear. PERRL, EOM's intact. Fundi benign. Ears: normal TM's and external ear canals both ears Nose: Nares normal. Septum midline. Mucosa normal. No drainage or sinus tenderness. Throat: lips, mucosa, and tongue normal; teeth and gums normal Neck: no adenopathy, no carotid bruit, no JVD, supple, symmetrical, trachea midline and thyroid not enlarged, symmetric, no tenderness/mass/nodules Back: symmetric, no curvature. ROM normal. No CVA tenderness. Lungs: clear to auscultation bilaterally Breasts: normal appearance, no masses or tenderness Heart: regular rate and rhythm, S1, S2  normal, no murmur, click, rub or gallop Abdomen: soft, non-tender; bowel sounds normal; no masses,  no organomegaly Pelvic: cervix normal in appearance, external genitalia normal, no adnexal masses or tenderness, no cervical motion tenderness, uterus normal size, shape, and consistency and vagina normal without discharge Extremities: extremities normal, atraumatic, no cyanosis or edema Pulses: 2+ and symmetric Skin: Skin color, texture, turgor normal. No rashes or lesions Lymph nodes: Cervical, supraclavicular, and axillary nodes normal. Neurologic: Grossly normal    Assessment:    Healthy female exam.      Plan:    CPE- Pap done. Discussed should be last pap done if normal. Vaccines up to date. Pt has been called for colonoscopy and will F/U with GI. Will order mammogram and bone density. Screening labs ordered. Discussed need for calcium and vitamin D. Reminded to stay active and balanced diet. Discussed starting a ASA daily. She takes Mobic daily and does not want to take both.   HTN- controlled today. Refilled medications. Reminded of low salt diet.   Depression screening negative. Fall risk- low. See After Visit Summary for Counseling Recommendations

## 2013-07-27 ENCOUNTER — Other Ambulatory Visit: Payer: Self-pay | Admitting: Physician Assistant

## 2013-07-27 MED ORDER — OMEPRAZOLE 20 MG PO CPDR: 20.0000 mg | DELAYED_RELEASE_CAPSULE | Freq: Every day | ORAL | Status: DC

## 2013-07-27 MED ORDER — SIMVASTATIN 20 MG PO TABS: 20.0000 mg | ORAL_TABLET | Freq: Every evening | ORAL | Status: DC

## 2013-07-27 NOTE — Telephone Encounter (Signed)
Ok I sent. Simvastatin for whatever reason was not listed on med list.

## 2013-07-27 NOTE — Telephone Encounter (Signed)
Is she off the cholesterol med or still on it

## 2013-07-27 NOTE — Telephone Encounter (Signed)
Patient called and left a message that she seen Lesly Rubenstein a week ago and she was suppose to call in 5 of her meds to [facility] and she only got THREE of them and needs you to call in the OTHER TWO to [facility] Omeprazole 20 MG capsule and Simvastatin 20 MG ... Call patient and let her know this has been done with [facility]. Her phone is [phone]. Thanks, [employer]

## 2013-08-09 ENCOUNTER — Ambulatory Visit (INDEPENDENT_AMBULATORY_CARE_PROVIDER_SITE_OTHER): Payer: 59

## 2013-08-09 ENCOUNTER — Ambulatory Visit: Payer: 59

## 2013-08-09 DIAGNOSIS — M899 Disorder of bone, unspecified: Secondary | ICD-10-CM

## 2013-08-09 DIAGNOSIS — Z13820 Encounter for screening for osteoporosis: Secondary | ICD-10-CM

## 2013-08-09 DIAGNOSIS — Z1239 Encounter for other screening for malignant neoplasm of breast: Secondary | ICD-10-CM

## 2013-08-09 DIAGNOSIS — Z1231 Encounter for screening mammogram for malignant neoplasm of breast: Secondary | ICD-10-CM

## 2013-09-08 ENCOUNTER — Telehealth: Payer: Self-pay | Admitting: *Deleted

## 2013-09-08 NOTE — Telephone Encounter (Signed)
Pt called and states she has received a bill from [facility] for her pap that was done in July of this year. She states she has been given the run around by [facility] and [employer]. She states she has talked to six different people within our billing dept and cannot get an answer. She finally talked to [facility] and they told her that she needs an authorization code for her pap. She specifically said not a dx code or a procedure code but authorization code. I didn't know if this was possibly something you had any idea about.Marland Kitchen

## 2013-10-21 ENCOUNTER — Telehealth: Payer: Self-pay | Admitting: *Deleted

## 2013-10-21 NOTE — Telephone Encounter (Signed)
Pt called and stated that she is having issues with the way that her pap was billed. She stated that it was coded as a preventative visit instead of a diagnostic. Pt stated that it was coded incorrectly and has gotten a run around and is asking that this gets taken care of ASAP. She has spoken w/her ins co and they informed her that Dr.Metheney would be responsible for resubmitting this. I told pt that we would work on this however it will take some Forwarded to pcp and matthew in billing for f/u.Loralee Pacas [place]

## 2013-10-28 NOTE — Telephone Encounter (Signed)
Rebekah Harrell,  We did not bill for a pap, Dr. Linford Arnold only charged the physical code (40981). The pap was sent down stairs so the patient has to follow up with [facility] since they were responsible for billing that charge. Patient should have received a statement from [facility].

## 2013-10-31 NOTE — Telephone Encounter (Signed)
Rebekah Harrell,  She has from my knowledge done this. The problem is that it should have been billed as a pap even though she was seen for a routine physical. .Heath Gold

## 2013-10-31 NOTE — Telephone Encounter (Signed)
Hi Tonya,  We did not bill a pap. [facility] billed for pap pathology which is appropriate to bill as screening since no documented problem. I cannot call to inquire about a charge [employer], so she would need to follow-up with them. [facility] has a billing department that can help her if she still needs further clarification.

## 2014-01-02 ENCOUNTER — Telehealth: Payer: Self-pay | Admitting: *Deleted

## 2014-01-02 NOTE — Telephone Encounter (Signed)
Just wanted to find out about the bill on this pt.  Last I heard (11/30/13) Toniann FailWendy had given you the info to send a corrected claim to [facility]. Thanks Molli HazardMatthew.

## 2014-02-27 ENCOUNTER — Telehealth: Payer: Self-pay | Admitting: *Deleted
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
## 2014-02-28 ENCOUNTER — Encounter: Payer: Medicare PPO | Admitting: Sports Medicine
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
## 2014-02-28 NOTE — Telephone Encounter (Signed)
Spoke with pt & she has an appt this afternoon with a "neck specialist".

## 2014-03-01 ENCOUNTER — Ambulatory Visit (INDEPENDENT_AMBULATORY_CARE_PROVIDER_SITE_OTHER): Payer: Medicare PPO

## 2014-03-01 ENCOUNTER — Encounter: Payer: Self-pay | Admitting: Sports Medicine

## 2014-03-01 ENCOUNTER — Ambulatory Visit (INDEPENDENT_AMBULATORY_CARE_PROVIDER_SITE_OTHER): Payer: Commercial Managed Care - HMO | Admitting: Sports Medicine

## 2014-03-01 DIAGNOSIS — M545 Low back pain, unspecified: Secondary | ICD-10-CM

## 2014-03-01 DIAGNOSIS — M542 Cervicalgia: Secondary | ICD-10-CM

## 2014-03-01 DIAGNOSIS — M4000 Postural kyphosis, site unspecified: Secondary | ICD-10-CM

## 2014-03-01 DIAGNOSIS — M549 Dorsalgia, unspecified: Secondary | ICD-10-CM

## 2014-03-01 DIAGNOSIS — M5030 Other cervical disc degeneration, unspecified cervical region: Secondary | ICD-10-CM

## 2014-03-01 DIAGNOSIS — M546 Pain in thoracic spine: Secondary | ICD-10-CM

## 2014-03-01 MED ORDER — PREDNISONE 50 MG PO TABS: ORAL_TABLET | ORAL | Status: DC

## 2014-03-01 MED ORDER — TRAMADOL HCL 50 MG PO TABS: ORAL_TABLET | ORAL | Status: DC

## 2014-03-01 MED ORDER — CYCLOBENZAPRINE HCL 10 MG PO TABS: ORAL_TABLET | ORAL | Status: DC
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
## 2014-03-02 ENCOUNTER — Other Ambulatory Visit: Payer: Self-pay

## 2014-03-02 MED ORDER — CYCLOBENZAPRINE HCL 10 MG PO TABS: ORAL_TABLET | ORAL | Status: DC

## 2014-03-02 MED ORDER — PREDNISONE 50 MG PO TABS: ORAL_TABLET | ORAL | Status: DC

## 2014-03-06 ENCOUNTER — Other Ambulatory Visit: Payer: Self-pay | Admitting: Sports Medicine

## 2014-03-06 MED ORDER — AMITRIPTYLINE HCL 50 MG PO TABS: ORAL_TABLET | ORAL | Status: DC

## 2014-03-06 NOTE — Telephone Encounter (Signed)
Insurance will not cover Flexeril, switching to amitriptyline.

## 2014-03-16 ENCOUNTER — Telehealth: Payer: Self-pay

## 2014-03-16 NOTE — Telephone Encounter (Signed)
Rebekah Harrell form [employer] called to do follow up for PA on Amitriptyline she stated that this is the 2nd request I asked her to fax over form and I will place it the box outside of Triage to be worked on. Please advise that I did look in the chart and nothing was documented. Quenten Nawaz,CMA

## 2014-03-30 ENCOUNTER — Ambulatory Visit (INDEPENDENT_AMBULATORY_CARE_PROVIDER_SITE_OTHER): Payer: Commercial Managed Care - HMO | Admitting: Sports Medicine

## 2014-03-30 ENCOUNTER — Encounter: Payer: Self-pay | Admitting: Sports Medicine

## 2014-03-30 DIAGNOSIS — M7740 Metatarsalgia, unspecified foot: Secondary | ICD-10-CM | POA: Insufficient documentation

## 2014-03-30 DIAGNOSIS — M7750 Other enthesopathy of unspecified foot: Secondary | ICD-10-CM

## 2014-03-30 MED ORDER — TRAMADOL HCL 50 MG PO TABS: ORAL_TABLET | ORAL | Status: DC

## 2014-03-30 NOTE — Assessment & Plan Note (Signed)
Symptoms are very well controlled with tramadol, up to twice a day. Refilling a three-month supply. Return as needed for this.

## 2014-03-30 NOTE — Assessment & Plan Note (Signed)
Return for custom orthotics, likely with metatarsal pads.
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
## 2014-04-17 ENCOUNTER — Ambulatory Visit (INDEPENDENT_AMBULATORY_CARE_PROVIDER_SITE_OTHER): Payer: Commercial Managed Care - HMO | Admitting: Sports Medicine

## 2014-04-17 ENCOUNTER — Encounter: Payer: Self-pay | Admitting: Sports Medicine

## 2014-04-17 VITALS — BP 130/71 | HR 78 | Wt 171.0 lb

## 2014-04-17 DIAGNOSIS — M7740 Metatarsalgia, unspecified foot: Secondary | ICD-10-CM

## 2014-04-17 DIAGNOSIS — M7750 Other enthesopathy of unspecified foot: Secondary | ICD-10-CM

## 2014-04-17 NOTE — Assessment & Plan Note (Signed)
Custom orthotics with metatarsal pads as above. Return in 2 - 4 weeks, we can consider an intra-articular metatarsal phalangeal joint injection if fails orthotics and NSAIDs.

## 2014-04-17 NOTE — Progress Notes (Signed)
    Patient was fitted for a : standard, cushioned, semi-rigid orthotic. The orthotic was heated and afterward the patient stood on the orthotic blank positioned on the orthotic stand. The patient was positioned in subtalar neutral position and 10 degrees of ankle dorsiflexion in a weight bearing stance. After completion of molding, a stable base was applied to the orthotic blank. The blank was ground to a stable position for weight bearing. Size: 8 Base: Blue EVA Additional Posting and Padding: Bilateral metatarsal pads. The patient ambulated these, and they were very comfortable.  I spent 40 minutes with this patient, greater than 50% was face-to-face time counseling regarding the below diagnosis.

## 2014-06-05 ENCOUNTER — Other Ambulatory Visit: Payer: Self-pay

## 2014-06-05 ENCOUNTER — Other Ambulatory Visit: Payer: Self-pay | Admitting: Sports Medicine

## 2014-06-05 MED ORDER — TRAMADOL HCL 50 MG PO TABS: ORAL_TABLET | ORAL | Status: DC

## 2014-06-07 ENCOUNTER — Encounter: Payer: Self-pay | Admitting: Physician Assistant

## 2014-06-07 ENCOUNTER — Other Ambulatory Visit: Payer: Self-pay | Admitting: Sports Medicine

## 2014-06-07 ENCOUNTER — Ambulatory Visit (INDEPENDENT_AMBULATORY_CARE_PROVIDER_SITE_OTHER): Payer: Commercial Managed Care - HMO | Admitting: Physician Assistant

## 2014-06-07 ENCOUNTER — Ambulatory Visit (INDEPENDENT_AMBULATORY_CARE_PROVIDER_SITE_OTHER): Payer: Commercial Managed Care - HMO

## 2014-06-07 VITALS — BP 119/72 | HR 70 | Ht 63.0 in | Wt 169.0 lb

## 2014-06-07 DIAGNOSIS — M654 Radial styloid tenosynovitis [de Quervain]: Secondary | ICD-10-CM

## 2014-06-07 DIAGNOSIS — M79644 Pain in right finger(s): Secondary | ICD-10-CM

## 2014-06-07 DIAGNOSIS — M1812 Unilateral primary osteoarthritis of first carpometacarpal joint, left hand: Secondary | ICD-10-CM | POA: Insufficient documentation

## 2014-06-07 DIAGNOSIS — M25549 Pain in joints of unspecified hand: Secondary | ICD-10-CM

## 2014-06-07 DIAGNOSIS — E162 Hypoglycemia, unspecified: Secondary | ICD-10-CM

## 2014-06-07 DIAGNOSIS — L03012 Cellulitis of left finger: Secondary | ICD-10-CM

## 2014-06-07 DIAGNOSIS — M79645 Pain in left finger(s): Secondary | ICD-10-CM

## 2014-06-07 DIAGNOSIS — M1811 Unilateral primary osteoarthritis of first carpometacarpal joint, right hand: Secondary | ICD-10-CM | POA: Insufficient documentation

## 2014-06-07 DIAGNOSIS — IMO0002 Reserved for concepts with insufficient information to code with codable children: Secondary | ICD-10-CM

## 2014-06-07 DIAGNOSIS — M79609 Pain in unspecified limb: Secondary | ICD-10-CM

## 2014-06-07 MED ORDER — MELOXICAM 15 MG PO TABS: 15.0000 mg | ORAL_TABLET | Freq: Every day | ORAL | Status: DC

## 2014-06-07 MED ORDER — DOXYCYCLINE HYCLATE 100 MG PO TABS: 100.0000 mg | ORAL_TABLET | Freq: Two times a day (BID) | ORAL | Status: DC

## 2014-06-07 MED ORDER — TRAMADOL HCL 50 MG PO TABS: ORAL_TABLET | ORAL | Status: DC

## 2014-06-07 NOTE — Patient Instructions (Addendum)
Hypoglycemia Hypoglycemia occurs when the glucose in your blood is too low. Glucose is a type of sugar that is your body's main energy source. Hormones, such as insulin and glucagon, control the level of glucose in the blood. Insulin lowers blood glucose and glucagon increases blood glucose. Having too much insulin in your blood stream, or not eating enough food containing sugar, can result in hypoglycemia. Hypoglycemia can happen to people with or without diabetes. It can develop quickly and can be a medical emergency.  CAUSES   Missing or delaying meals.  Not eating enough carbohydrates at meals.  Taking too much diabetes medicine.  Not timing your oral diabetes medicine or insulin doses with meals, snacks, and exercise.  Nausea and vomiting.  Certain medicines.  Severe illnesses, such as hepatitis, kidney disorders, and certain eating disorders.  Increased activity or exercise without eating something extra or adjusting medicines.  Drinking too much alcohol.  A nerve disorder that affects body functions like your heart rate, blood pressure, and digestion (autonomic neuropathy).  A condition where the stomach muscles do not function properly (gastroparesis). Therefore, medicines and food may not absorb properly.  Rarely, a tumor of the pancreas can produce too much insulin. SYMPTOMS   Hunger.  Sweating (diaphoresis).  Change in body temperature.  Shakiness.  Headache.  Anxiety.  Lightheadedness.  Irritability.  Difficulty concentrating.  Dry mouth.  Tingling or numbness in the hands or feet.  Restless sleep or sleep disturbances.  Altered speech and coordination.  Change in mental status.  Seizures or prolonged convulsions.  Combativeness.  Drowsiness (lethargic).  Weakness.  Increased heart rate or palpitations.  Confusion.  Pale, gray skin color.  Blurred or double vision.  Fainting. DIAGNOSIS  A physical exam and medical history will be  performed. Your caregiver may make a diagnosis based on your symptoms. Blood tests and other lab tests may be performed to confirm a diagnosis. Once the diagnosis is made, your caregiver will see if your signs and symptoms go away once your blood glucose is raised.  TREATMENT  Usually, you can easily treat your hypoglycemia when you notice symptoms.  Check your blood glucose. If it is less than 70 mg/dl, take one of the following:   3-4 glucose tablets.    cup juice.    cup regular soda.   1 cup skim milk.   -1 tube of glucose gel.   5-6 hard candies.   Avoid high-fat drinks or food that may delay a rise in blood glucose levels.  Do not take more than the recommended amount of sugary foods, drinks, gel, or tablets. Doing so will cause your blood glucose to go too high.   Wait 10-15 minutes and recheck your blood glucose. If it is still less than 70 mg/dl or below your target range, repeat treatment.   Eat a snack if it is more than 1 hour until your next meal.  There may be a time when your blood glucose may go so low that you are unable to treat yourself at home when you start to notice symptoms. You may need someone to help you. You may even faint or be unable to swallow. If you cannot treat yourself, someone will need to bring you to the hospital.  HOME CARE INSTRUCTIONS  If you have diabetes, follow your diabetes management plan by:  Taking your medicines as directed.  Following your exercise plan.  Following your meal plan. Do not skip meals. Eat on time.  Testing your blood   glucose regularly. Check your blood glucose before and after exercise. If you exercise longer or different than usual, be sure to check blood glucose more frequently.  Wearing your medical alert jewelry that says you have diabetes.  Identify the cause of your hypoglycemia. Then, develop ways to prevent the recurrence of hypoglycemia.  Do not take a hot bath or shower right after an  insulin shot.  Always carry treatment with you. Glucose tablets are the easiest to carry.  If you are going to drink alcohol, drink it only with meals.  Tell friends or family members ways to keep you safe during a seizure. This may include removing hard or sharp objects from the area or turning you on your side.  Maintain a healthy weight. SEEK MEDICAL CARE IF:   You are having problems keeping your blood glucose in your target range.  You are having frequent episodes of hypoglycemia.  You feel you might be having side effects from your medicines.  You are not sure why your blood glucose is dropping so low.  You notice a change in vision or a new problem with your vision. SEEK IMMEDIATE MEDICAL CARE IF:   Confusion develops.  A change in mental status occurs.  The inability to swallow develops.  Fainting occurs. Document Released: 12/08/2005 Document Revised: 12/13/2013 Document Reviewed: 04/05/2012 [facility] Patient Information 2015 [place], [place]. This information is not intended to replace advice given to you by your health care provider. Make sure you discuss any questions you have with your health care provider.   De Quervain's Tenosynovitis De Quervain's tenosynovitis involves inflammation of one or two tendon linings (sheaths) or strain of one or two tendons to the thumb: extensor pollicis brevis (EPB), or abductor pollicis longus (APL). This causes pain on the side of the wrist and base of the thumb. Tendon sheaths secrete a fluid that lubricates the tendon, allowing the tendon to move smoothly. When the sheath becomes inflamed, the tendon cannot move freely in the sheath. Both the EPB and APL tendons are important for proper use of the hand. The EPB tendon is important for straightening the thumb. The APL tendon is important for moving the thumb away from the index finger (abducting). The two tendons pass through a small tube (canal) in the wrist, near the base of the  thumb. When the tendons become inflamed, pain is usually felt in this area. SYMPTOMS   Pain, tenderness, swelling, warmth, or redness over the base of the thumb and thumb side of the wrist.  Pain that gets worse when straightening the thumb.  Pain that gets worse when moving the thumb away from the index finger, against resistance.  Pain with pinching or gripping.  Locking or catching of the thumb.  Limited motion of the thumb.  Crackling sound (crepitation) when the tendon or thumb is moved or touched.  Fluid-filled cyst in the area of the base of the thumb. CAUSES   Tenosynovitis is often linked with overuse of the wrist.  Tenosynovitis may be caused by repeated injury to the thumb muscle and tendon units, and with repeated motions of the hand and wrist, due to friction of the tendon within the lining (sheath).  Tenosynovitis may also be due to a sudden increase in activity or change in activity. RISK INCREASES WITH:  Sports that involve repeated hand and wrist motions (golf, bowling, tennis, squash, racquetball).  Heavy labor.  Poor physical wrist strength and flexibility.  Failure to warm up properly before practice or play.  Female gender.  New mothers who hold their baby's head for long periods or lift infants with thumbs in the infant's armpit (axilla). PREVENTION  Warm up and stretch properly before practice or competition.  Allow enough time for rest and recovery between practices and competition.  Maintain appropriate conditioning:  Cardiovascular fitness.  Forearm, wrist, and hand flexibility.  Muscle strength and endurance.  Use proper exercise technique. PROGNOSIS  This condition is usually curable within 6 weeks, if treated properly with non-surgical treatment and resting of the affected area.  RELATED COMPLICATIONS   Longer healing time if not properly treated or if not given enough time to heal.  Chronic inflammation, causing recurring  symptoms of tenosynovitis. Permanent pain or restriction of movement.  Risks of surgery: infection, bleeding, injury to nerves (numbness of the thumb), continued pain, incomplete release of the tendon sheath, recurring symptoms, cutting of the tendons, tendons sliding out of position, weakness of the thumb, thumb stiffness. TREATMENT  First, treatment involves the use of medicine and ice, to reduce pain and inflammation. Patients are encouraged to stop or modify activities that aggravate the injury. Stretching and strengthening exercises may be advised. Exercises may be completed at home or with a therapist. You may be fitted with a brace or splint, to limit motion and allow the injury to heal. Your caregiver may also choose to give you a corticosteroid injection, to reduce the pain and inflammation. If non-surgical treatment is not successful, surgery may be needed. Most tenosynovitis surgeries are done as outpatient procedures (you go home the same day). Surgery may involve local, regional (whole arm), or general anesthesia.  MEDICATION   If pain medicine is needed, nonsteroidal anti-inflammatory medicines (aspirin and ibuprofen), or other minor pain relievers (acetaminophen), are often advised.  Do not take pain medicine for 7 days before surgery.  Prescription pain relievers are often prescribed only after surgery. Use only as directed and only as much as you need.  Corticosteroid injections may be given if your caregiver thinks they are needed. There is a limited number of times these injections may be given. COLD THERAPY   Cold treatment (icing) should be applied for 10 to 15 minutes every 2 to 3 hours for inflammation and pain, and immediately after activity that aggravates your symptoms. Use ice packs or an ice massage. SEEK MEDICAL CARE IF:   Symptoms get worse or do not improve in 2 to 4 weeks, despite treatment.  You experience pain, numbness, or coldness in the hand.  Blue, gray,  or dark color appears in the fingernails.  Any of the following occur after surgery: increased pain, swelling, redness, drainage of fluids, bleeding in the affected area, or signs of infection.  New, unexplained symptoms develop. (Drugs used in treatment may produce side effects.) Document Released: 12/08/2005 Document Revised: 03/01/2012 Document Reviewed: 03/22/2009 [facility] Patient Information 2015 [place], [place]. This information is not intended to replace advice given to you by your health care provider. Make sure you discuss any questions you have with your health care provider. De Quervain's Disease Suzette Battiest disease is a condition often seen in racquet sports where there is a soreness (inflammation) in the cord like structures (tendons) which attach muscle to bone on the thumb side of the wrist. There may be a tightening of the tissuesaround the tendons. This condition is often helped by giving up or modifying the activity which caused it. When conservative treatment does not help, surgery may be required. Conservative treatment could include changes in the  activity which brought about the problem or made it worse. Anti-inflammatory medications and injections may be used to help decrease the inflammation and help with pain control. Your caregiver will help you determine which is best for you. DIAGNOSIS  Often the diagnosis (learning what is wrong) can be made by examination. Sometimes x-rays are required. HOME CARE INSTRUCTIONS   Apply ice to the sore area for 15-20 minutes, 03-04 times per day while awake. Put the ice in a plastic bag and place a towel between the bag of ice and your skin. This is especially helpful if it can be done after all activities involving the sore wrist.  Temporary splinting may help.  Only take over-the-counter or prescription medicines for pain, discomfort or fever as directed by your caregiver. SEEK MEDICAL CARE IF:   Pain relief is not obtained with  medications, or if you have increasing pain and seem to be getting worse rather than better. MAKE SURE YOU:   Understand these instructions.  Will watch your condition.  Will get help right away if you are not doing well or get worse. Document Released: 09/02/2001 Document Revised: 03/01/2012 Document Reviewed: 12/08/2005 [facility] Patient Information 2015 [place], [place]. This information is not intended to replace advice given to you by your health care provider. Make sure you discuss any questions you have with your health care provider.  Paronychia Paronychia is an inflammatory reaction involving the folds of the skin surrounding the fingernail. This is commonly caused by an infection in the skin around a nail. The most common cause of paronychia is frequent wetting of the hands (as seen with bartenders, food servers, nurses or others who wet their hands). This makes the skin around the fingernail susceptible to infection by bacteria (germs) or fungus. Other predisposing factors are:  Aggressive manicuring.  Nail biting.  Thumb sucking. The most common cause is a staphylococcal (a type of germ) infection, or a fungal (Candida) infection. When caused by a germ, it usually comes on suddenly with redness, swelling, pus and is often painful. It may get under the nail and form an abscess (collection of pus), or form an abscess around the nail. If the nail itself is infected with a fungus, the treatment is usually prolonged and may require oral medicine for up to one year. Your caregiver will determine the length of time treatment is required. The paronychia caused by bacteria (germs) may largely be avoided by not pulling on hangnails or picking at cuticles. When the infection occurs at the tips of the finger it is called felon. When the cause of paronychia is from the herpes simplex virus (HSV) it is called herpetic whitlow. TREATMENT  When an abscess is present treatment is often incision and  drainage. This means that the abscess must be cut open so the pus can get out. When this is done, the following home care instructions should be followed. HOME CARE INSTRUCTIONS   It is important to keep the affected fingers very dry. Rubber or plastic gloves over cotton gloves should be used whenever the hand must be placed in water.  Keep wound clean, dry and dressed as suggested by your caregiver between warm soaks or warm compresses.  Soak in warm water for fifteen to twenty minutes three to four times per day for bacterial infections. Fungal infections are very difficult to treat, so often require treatment for long periods of time.  For bacterial (germ) infections take antibiotics (medicine which kill germs) as directed and finish the prescription,  even if the problem appears to be solved before the medicine is gone.  Only take over-the-counter or prescription medicines for pain, discomfort, or fever as directed by your caregiver. SEEK IMMEDIATE MEDICAL CARE IF:  You have redness, swelling, or increasing pain in the wound.  You notice pus coming from the wound.  You have a fever.  You notice a bad smell coming from the wound or dressing. Document Released: 06/03/2001 Document Revised: 03/01/2012 Document Reviewed: 02/02/2009 [facility] Patient Information 2015 [place], [place]. This information is not intended to replace advice given to you by your health care provider. Make sure you discuss any questions you have with your health care provider.

## 2014-06-09 DIAGNOSIS — E162 Hypoglycemia, unspecified: Secondary | ICD-10-CM | POA: Insufficient documentation

## 2014-06-09 DIAGNOSIS — IMO0002 Reserved for concepts with insufficient information to code with codable children: Secondary | ICD-10-CM | POA: Insufficient documentation
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
## 2014-08-07 ENCOUNTER — Encounter: Payer: Self-pay | Admitting: Family Medicine

## 2014-08-07 ENCOUNTER — Ambulatory Visit (INDEPENDENT_AMBULATORY_CARE_PROVIDER_SITE_OTHER): Payer: Commercial Managed Care - HMO | Admitting: Family Medicine

## 2014-08-07 DIAGNOSIS — Z0000 Encounter for general adult medical examination without abnormal findings: Secondary | ICD-10-CM

## 2014-08-07 DIAGNOSIS — E162 Hypoglycemia, unspecified: Secondary | ICD-10-CM

## 2014-08-07 DIAGNOSIS — Z13220 Encounter for screening for lipoid disorders: Secondary | ICD-10-CM

## 2014-08-07 DIAGNOSIS — Z1231 Encounter for screening mammogram for malignant neoplasm of breast: Secondary | ICD-10-CM

## 2014-08-07 MED ORDER — OMEPRAZOLE 20 MG PO CPDR: 20.0000 mg | DELAYED_RELEASE_CAPSULE | Freq: Every day | ORAL | Status: DC

## 2014-08-07 MED ORDER — ENALAPRIL MALEATE 20 MG PO TABS: 20.0000 mg | ORAL_TABLET | Freq: Every day | ORAL | Status: DC

## 2014-08-07 MED ORDER — AMLODIPINE BESYLATE 5 MG PO TABS: 5.0000 mg | ORAL_TABLET | Freq: Every day | ORAL | Status: DC

## 2014-08-07 MED ORDER — SIMVASTATIN 20 MG PO TABS: 20.0000 mg | ORAL_TABLET | Freq: Every evening | ORAL | Status: DC
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
## 2014-08-07 NOTE — Patient Instructions (Signed)
Keep up a regular exercise program and make sure you are eating a healthy diet Try to eat 4 servings of dairy a day, or if you are lactose intolerant take a calcium with vitamin D daily.  Your vaccines are up to date.   

## 2014-08-15 LAB — LIPID PANEL
CHOLESTEROL: 164 mg/dL (ref 0–200)
HDL: 48 mg/dL (ref >39)
LDL Cholesterol: 80 mg/dL (ref 0–99)
Total CHOL/HDL Ratio: 3.4 Ratio
Triglycerides: 180 mg/dL — ABNORMAL HIGH (ref <150)
VLDL: 36 mg/dL (ref 0–40)

## 2014-08-15 LAB — COMPLETE METABOLIC PANEL WITH GFR
ALT: 18 U/L (ref 0–35)
AST: 22 U/L (ref 0–37)
Albumin: 4.6 g/dL (ref 3.5–5.2)
Alkaline Phosphatase: 61 U/L (ref 39–117)
BILIRUBIN TOTAL: 0.5 mg/dL (ref 0.2–1.2)
BUN: 16 mg/dL (ref 6–23)
CO2: 26 mEq/L (ref 19–32)
Calcium: 9.8 mg/dL (ref 8.4–10.5)
Chloride: 105 mEq/L (ref 96–112)
Creat: 0.99 mg/dL (ref 0.50–1.10)
GFR, EST AFRICAN AMERICAN: 66 mL/min
GFR, EST NON AFRICAN AMERICAN: 57 mL/min — AB
GLUCOSE: 101 mg/dL — AB (ref 70–99)
Potassium: 4.5 mEq/L (ref 3.5–5.3)
Sodium: 140 mEq/L (ref 135–145)
Total Protein: 6.9 g/dL (ref 6.0–8.3)

## 2014-08-15 LAB — HEMOGLOBIN A1C
Hgb A1c MFr Bld: 5.8 % — ABNORMAL HIGH (ref <5.7)
Mean Plasma Glucose: 120 mg/dL — ABNORMAL HIGH (ref <117)

## 2014-08-15 LAB — TSH: TSH: 2.782 u[IU]/mL (ref 0.350–4.500)

## 2014-08-16 ENCOUNTER — Telehealth: Payer: Self-pay

## 2014-08-16 NOTE — Telephone Encounter (Signed)
Opened in error

## 2014-08-17 ENCOUNTER — Other Ambulatory Visit: Payer: Self-pay | Admitting: *Deleted

## 2014-08-17 MED ORDER — MELOXICAM 15 MG PO TABS: 15.0000 mg | ORAL_TABLET | Freq: Every day | ORAL | Status: DC

## 2015-01-02 ENCOUNTER — Other Ambulatory Visit: Payer: Self-pay | Admitting: *Deleted

## 2015-01-02 MED ORDER — TRAMADOL HCL 50 MG PO TABS: ORAL_TABLET | ORAL | Status: DC

## 2015-02-01 ENCOUNTER — Encounter: Payer: Self-pay | Admitting: Internal Medicine

## 2015-02-14 ENCOUNTER — Other Ambulatory Visit: Payer: Self-pay | Admitting: Family Medicine

## 2015-03-27 ENCOUNTER — Other Ambulatory Visit: Payer: Self-pay | Admitting: Family Medicine

## 2015-08-07 ENCOUNTER — Other Ambulatory Visit: Payer: Self-pay | Admitting: *Deleted

## 2015-08-07 MED ORDER — MELOXICAM 15 MG PO TABS: ORAL_TABLET | ORAL | Status: DC

## 2015-08-07 MED ORDER — AMLODIPINE BESYLATE 5 MG PO TABS: 5.0000 mg | ORAL_TABLET | Freq: Every day | ORAL | Status: DC

## 2015-08-07 MED ORDER — SIMVASTATIN 20 MG PO TABS
20.0000 mg | ORAL_TABLET | Freq: Every evening | ORAL | Status: DC
Start: 2015-08-07 — End: 2016-08-19

## 2015-08-07 MED ORDER — OMEPRAZOLE 20 MG PO CPDR: 20.0000 mg | DELAYED_RELEASE_CAPSULE | Freq: Every day | ORAL | Status: DC

## 2015-08-07 MED ORDER — ENALAPRIL MALEATE 20 MG PO TABS: 20.0000 mg | ORAL_TABLET | Freq: Every day | ORAL | Status: DC

## 2015-08-07 MED ORDER — TRAMADOL HCL 50 MG PO TABS
ORAL_TABLET | ORAL | Status: DC
Start: 2015-08-07 — End: 2015-10-29

## 2015-08-07 NOTE — Telephone Encounter (Signed)
Pt called and stated that she had to move her CPE to 09/07/15 and her medications will run out before then and is asking that refills be sent to her mail order. Refills sent expressed to pt the importance of keeping her appt. She voiced understanding and agreed.Loralee Pacas Barnum

## 2015-09-07 ENCOUNTER — Encounter: Payer: Self-pay | Admitting: Family Medicine

## 2015-09-07 ENCOUNTER — Ambulatory Visit (INDEPENDENT_AMBULATORY_CARE_PROVIDER_SITE_OTHER): Payer: Commercial Managed Care - HMO | Admitting: Family Medicine

## 2015-09-07 VITALS — BP 120/56 | HR 64 | Ht 64.0 in | Wt 170.0 lb

## 2015-09-07 DIAGNOSIS — Z0000 Encounter for general adult medical examination without abnormal findings: Secondary | ICD-10-CM | POA: Diagnosis not present

## 2015-09-07 DIAGNOSIS — I10 Essential (primary) hypertension: Secondary | ICD-10-CM

## 2015-09-07 DIAGNOSIS — E785 Hyperlipidemia, unspecified: Secondary | ICD-10-CM

## 2015-09-07 DIAGNOSIS — J4520 Mild intermittent asthma, uncomplicated: Secondary | ICD-10-CM | POA: Diagnosis not present

## 2015-09-07 LAB — COMPLETE METABOLIC PANEL WITH GFR
ALT: 16 U/L (ref 6–29)
AST: 21 U/L (ref 10–35)
Albumin: 4.4 g/dL (ref 3.6–5.1)
Alkaline Phosphatase: 60 U/L (ref 33–130)
BUN: 19 mg/dL (ref 7–25)
CALCIUM: 9.4 mg/dL (ref 8.6–10.4)
CHLORIDE: 106 mmol/L (ref 98–110)
CO2: 26 mmol/L (ref 20–31)
CREATININE: 1.06 mg/dL — AB (ref 0.60–0.93)
GFR, Est African American: 60 mL/min (ref >=60)
GFR, Est Non African American: 52 mL/min — ABNORMAL LOW (ref >=60)
Glucose, Bld: 84 mg/dL (ref 65–99)
Potassium: 5 mmol/L (ref 3.5–5.3)
Sodium: 143 mmol/L (ref 135–146)
Total Bilirubin: 0.5 mg/dL (ref 0.2–1.2)
Total Protein: 6.7 g/dL (ref 6.1–8.1)

## 2015-09-07 LAB — LIPID PANEL
CHOLESTEROL: 145 mg/dL (ref 125–200)
HDL: 46 mg/dL (ref >=46)
LDL CALC: 72 mg/dL (ref <130)
Total CHOL/HDL Ratio: 3.2 Ratio (ref <=5.0)
Triglycerides: 136 mg/dL (ref <150)
VLDL: 27 mg/dL (ref <30)

## 2015-09-07 MED ORDER — ALBUTEROL SULFATE HFA 108 (90 BASE) MCG/ACT IN AERS: 2.0000 | INHALATION_SPRAY | Freq: Four times a day (QID) | RESPIRATORY_TRACT | Status: DC | PRN

## 2015-09-07 NOTE — Progress Notes (Signed)
Subjective:    Rebekah Harrell is a 74 y.o. female who presents for Medicare Annual/Subsequent preventive examination.  Preventive Screening-Counseling & Management  Tobacco History  Smoking status  . Never Smoker   Smokeless tobacco  . Not on file     Problems Prior to Visit 1. Asthma. She has no some increased short of breath going up steps but otherwise it does not limit her activities. She says her current inhaler is probably about 74 years old and her last spirometry was about 8 years ago. She declines a flu shot or pneumonia vaccine.  2. Hypertension- Pt denies chest pain, SOB, dizziness, or heart palpitations.  Taking meds as directed w/o problems.  Denies medication side effects.    3. Hyperlipidemia-tolerate statin well. Due to recheck lipid panel.  Current Problems (verified) Patient Active Problem List   Diagnosis Date Noted  . Hypoglycemia 06/09/2014  . Osteoarthritis of left thumb 06/07/2014  . Osteoarthritis of right thumb 06/07/2014  . Arrhythmia 04/30/2011  . BACK PAIN 04/03/2010  . OSTEOPENIA 04/03/2010  . SEBORRHEIC KERATOSIS 02/18/2008  . CAROTID ARTERY STENOSIS, BILATERAL 02/15/2008  . Asthma 08/03/2007  . IBS 08/03/2007  . Hyperlipidemia 07/05/2007  . HYPERTENSION, BENIGN ESSENTIAL 07/05/2007    Medications Prior to Visit Current Outpatient Prescriptions on File Prior to Visit  Medication Sig Dispense Refill  . amLODipine (NORVASC) 5 MG tablet Take 1 tablet (5 mg total) by mouth daily. 90 tablet 0  . enalapril (VASOTEC) 20 MG tablet Take 1 tablet (20 mg total) by mouth daily. 90 tablet 0  . meloxicam (MOBIC) 15 MG tablet TAKE 1 TABLET EVERY DAY 90 tablet 0  . omeprazole (PRILOSEC) 20 MG capsule Take 1 capsule (20 mg total) by mouth daily. 90 capsule 0  . simvastatin (ZOCOR) 20 MG tablet Take 1 tablet (20 mg total) by mouth every evening. 90 tablet 4  . traMADol (ULTRAM) 50 MG tablet One tab PO BID prn pain. 180 tablet 0   No current  facility-administered medications on file prior to visit.    Current Medications (verified) Current Outpatient Prescriptions  Medication Sig Dispense Refill  . amLODipine (NORVASC) 5 MG tablet Take 1 tablet (5 mg total) by mouth daily. 90 tablet 0  . enalapril (VASOTEC) 20 MG tablet Take 1 tablet (20 mg total) by mouth daily. 90 tablet 0  . meloxicam (MOBIC) 15 MG tablet TAKE 1 TABLET EVERY DAY 90 tablet 0  . omeprazole (PRILOSEC) 20 MG capsule Take 1 capsule (20 mg total) by mouth daily. 90 capsule 0  . simvastatin (ZOCOR) 20 MG tablet Take 1 tablet (20 mg total) by mouth every evening. 90 tablet 4  . traMADol (ULTRAM) 50 MG tablet One tab PO BID prn pain. 180 tablet 0  . albuterol (PROVENTIL HFA;VENTOLIN HFA) 108 (90 BASE) MCG/ACT inhaler Inhale 2 puffs into the lungs every 6 (six) hours as needed for wheezing or shortness of breath. 1 Inhaler 1   No current facility-administered medications for this visit.     Allergies (verified) Celecoxib; Cetirizine hcl; Penicillins; and Sulfonamide derivatives   PAST HISTORY  Family History No family history on file.  Social History Social History  Substance Use Topics  . Smoking status: Never Smoker   . Smokeless tobacco: Not on file  . Alcohol Use: Not on file     Are there smokers in your home (other than you)? No  Risk Factors Current exercise habits: The patient does not participate in regular exercise at present.  Dietary  issues discussed: None   Cardiac risk factors: advanced age (older than 87 for men, 71 for women), dyslipidemia and hypertension.  Depression Screen (Note: if answer to either of the following is "Yes", a more complete depression screening is indicated)   Over the past two weeks, have you felt down, depressed or hopeless? Yes  Over the past two weeks, have you felt little interest or pleasure in doing things? Yes  Have you lost interest or pleasure in daily life? No   Activities of Daily Living In your  present state of health, do you have any difficulty performing the following activities?:  Driving? No Managing money?  No Feeding yourself? No Getting from bed to chair? No   Climbing a flight of stairs? Yes Preparing food and eating?: No Bathing or showering? No Getting dressed: No Getting to the toilet? No Using the toilet:No Moving around from place to place: No In the past year have you fallen or had a near fall?:No    Hearing Difficulties: No Do you often ask people to speak up or repeat themselves? No Do you experience ringing or noises in your ears? No Do you have difficulty understanding soft or whispered voices? No   Do you feel that you have a problem with memory? No  Do you often misplace items? No  Do you feel safe at home?  Yes  Cognitive Testing  Alert? Yes  Normal Appearance?Yes  Oriented to person? Yes  Place? Yes   Time? Yes  Recall of three objects?  Yes  Can perform simple calculations? Yes  Displays appropriate judgment?Yes  Can read the correct time from a watch face?Yes   Advanced Directives have been discussed with the patient? Yes  List the Names of Other Physician/Practitioners you currently use: 1.     Indicate any recent Medical Services you may have received from other than [facility] providers in the past year (date may be approximate).  Immunization History  Administered Date(s) Administered  . Td 05/24/2001    Screening Tests Health Maintenance  Topic Date Due  . INFLUENZA VACCINE  09/06/2016 (Originally 07/23/2015)  . MAMMOGRAM  09/06/2016 (Originally 08/09/2014)  . COLONOSCOPY  09/06/2016 (Originally 12/22/2012)  . PNA vac Low Risk Adult (1 of 2 - PCV13) 09/06/2016 (Originally 10/20/2006)  . TETANUS/TDAP  05/03/2022  . DEXA SCAN  Completed  . ZOSTAVAX  Addressed    All answers were reviewed with the patient and necessary referrals were made:  METHENEY,[name], MD   09/07/2015   History reviewed: allergies, current medications, past  family history, past medical history, past social history, past surgical history and problem list  Review of Systems A comprehensive review of systems was negative.    Objective:     Vision by Snellen chart: See vision tab.  Body mass index is 29.17 kg/(m^2). BP 120/56 mmHg  Pulse 64  Ht  (1.626 m)  Wt 170 lb (77.111 kg)  BMI 29.17 kg/m2  BP 120/56 mmHg  Pulse 64  Ht  (1.626 m)  Wt 170 lb (77.111 kg)  BMI 29.17 kg/m2 General appearance: alert, cooperative and appears stated age Head: Normocephalic, without obvious abnormality, atraumatic Eyes: conj clare, EOMi, PEERLA Ears: normal TM's and external ear canals both ears Nose: Nares normal. Septum midline. Mucosa normal. No drainage or sinus tenderness. Throat: lips, mucosa, and tongue normal; teeth and gums normal Neck: no adenopathy, no carotid bruit, no JVD, supple, symmetrical, trachea midline and thyroid not enlarged, symmetric, no tenderness/mass/nodules Back: symmetric,  no curvature. ROM normal. No CVA tenderness. Lungs: clear to auscultation bilaterally Breasts: normal appearance, no masses or tenderness Heart: regular rate and rhythm, S1, S2 normal, no murmur, click, rub or gallop Abdomen: soft, non-tender; bowel sounds normal; no masses,  no organomegaly Extremities: extremities normal, atraumatic, no cyanosis or edema Pulses: 2+ and symmetric Skin: Skin color, texture, turgor normal. No rashes or lesions Lymph nodes: Cervical, supraclavicular, and axillary nodes normal. Neurologic: Alert and oriented X 3, normal strength and tone. Normal symmetric reflexes. Normal coordination and gait     Assessment:     Annual wellness Exam      Plan:     During the course of the visit the patient was educated and counseled about appropriate screening and preventive services including:    Influenza vaccine - declined.    Pneumonia vaccine - declined.   Asthma -  Recommended she follow-up this fall with  spirometry. Stable of Proair given for her to use as needed.her last spirometry was about 8 years ago. She has noticed some shorts of breath with going up steps but doesn't really bother her at other times and denies any wheezing.  Given packe for advanced directives.   Mammogram discussed. Declined.    Encouraged more regular exercise.    HTN - well controlled. Continue current regimen. Follow-up in 6 months.  Hyperlipidemia-due to recheck lipid panel.  Diet review for nutrition referral? Yes ____  Not Indicated __X__   Patient Instructions (the written plan) was given to the patient.  Medicare Attestation I have personally reviewed: The patient's medical and social history Their use of alcohol, tobacco or illicit drugs Their current medications and supplements The patient's functional ability including ADLs,fall risks, home safety risks, cognitive, and hearing and visual impairment Diet and physical activities Evidence for depression or mood disorders  The patient's weight, height, BMI, and visual acuity have been recorded in the chart.  I have made referrals, counseling, and provided education to the patient based on review of the above and I have provided the patient with a written personalized care plan for preventive services.     METHENEY,[name], MD   09/07/2015

## 2015-09-07 NOTE — Patient Instructions (Signed)
Keep up a regular exercise program and make sure you are eating a healthy diet Try to eat 4 servings of dairy a day, or if you are lactose intolerant take a calcium with vitamin D daily.  Your vaccines are up to date.   

## 2015-09-08 LAB — TSH: TSH: 2.432 u[IU]/mL (ref 0.350–4.500)

## 2015-09-10 ENCOUNTER — Other Ambulatory Visit: Payer: Self-pay | Admitting: *Deleted

## 2015-09-10 DIAGNOSIS — R7989 Other specified abnormal findings of blood chemistry: Secondary | ICD-10-CM

## 2015-10-29 ENCOUNTER — Encounter: Payer: Self-pay | Admitting: Family Medicine

## 2015-10-29 ENCOUNTER — Ambulatory Visit (INDEPENDENT_AMBULATORY_CARE_PROVIDER_SITE_OTHER): Payer: Commercial Managed Care - HMO | Admitting: Family Medicine

## 2015-10-29 VITALS — BP 130/70 | HR 79 | Wt 171.5 lb

## 2015-10-29 DIAGNOSIS — I10 Essential (primary) hypertension: Secondary | ICD-10-CM | POA: Diagnosis not present

## 2015-10-29 DIAGNOSIS — R7989 Other specified abnormal findings of blood chemistry: Secondary | ICD-10-CM | POA: Diagnosis not present

## 2015-10-29 DIAGNOSIS — N183 Chronic kidney disease, stage 3 unspecified: Secondary | ICD-10-CM

## 2015-10-29 DIAGNOSIS — R748 Abnormal levels of other serum enzymes: Secondary | ICD-10-CM | POA: Diagnosis not present

## 2015-10-29 DIAGNOSIS — R7301 Impaired fasting glucose: Secondary | ICD-10-CM | POA: Diagnosis not present

## 2015-10-29 MED ORDER — AMLODIPINE BESYLATE 5 MG PO TABS: 5.0000 mg | ORAL_TABLET | Freq: Every day | ORAL | Status: DC

## 2015-10-29 MED ORDER — ENALAPRIL MALEATE 20 MG PO TABS: 20.0000 mg | ORAL_TABLET | Freq: Every day | ORAL | Status: DC

## 2015-10-29 MED ORDER — OMEPRAZOLE 20 MG PO CPDR: 20.0000 mg | DELAYED_RELEASE_CAPSULE | Freq: Every day | ORAL | Status: DC

## 2015-10-29 MED ORDER — TRAMADOL HCL 50 MG PO TABS: ORAL_TABLET | ORAL | Status: DC

## 2015-10-29 NOTE — Progress Notes (Signed)
   Subjective:    Patient ID: Rebekah MylarMarcia L Harrell, female    DOB: 10-24-1941, 74 y.o.   MRN: [id number]  HPI Here to review her labwork.  Her kidney function was elevated.  She had some questions about it.    IFG - Occ feels thirsty but says she really doesn't drink enough fluid.  She doesn't really like water.  She still has an occasional hypoglycemic episode but tries really hard to eat regularly. If she eats and doesn't skip meals and she usually does fine.  Hypertension- Pt denies chest pain, SOB, dizziness, or heart palpitations.  Taking meds as directed w/o problems.  Denies medication side effects.      Review of Systems     Objective:   Physical Exam  Constitutional: She is oriented to person, place, and time. She appears well-developed and well-nourished.  HENT:  Head: Normocephalic and atraumatic.  Cardiovascular: Normal rate, regular rhythm and normal heart sounds.   Pulmonary/Chest: Effort normal and breath sounds normal.  Neurological: She is alert and oriented to person, place, and time.  Skin: Skin is warm and dry.  Psychiatric: She has a normal mood and affect. Her behavior is normal.          Assessment & Plan:  Elevated kidney function/CKD 3 - we did discuss avoiding excess use of NSAIDs such as Aleve and ibuprofen. She does have perception for meloxicam and that is an NSAID as well. Encouraged her to use that more sparingly.  IFG - no inc thirst or urination. She prefers to have her A1c checked through blood work instead of through a fingerstick today. Lab slip provided.  HTN - well controlled. Continue current regimen. Refills sent to mail order pharmacy. Follow-up in 6 months.

## 2015-10-30 LAB — BASIC METABOLIC PANEL WITH GFR
BUN: 15 mg/dL (ref 7–25)
CHLORIDE: 104 mmol/L (ref 98–110)
CO2: 27 mmol/L (ref 20–31)
Calcium: 8.7 mg/dL (ref 8.6–10.4)
Creat: 0.85 mg/dL (ref 0.60–0.93)
GFR, EST AFRICAN AMERICAN: 78 mL/min (ref >=60)
GFR, EST NON AFRICAN AMERICAN: 68 mL/min (ref >=60)
Glucose, Bld: 106 mg/dL — ABNORMAL HIGH (ref 65–99)
POTASSIUM: 4 mmol/L (ref 3.5–5.3)
Sodium: 141 mmol/L (ref 135–146)

## 2015-10-30 LAB — HEMOGLOBIN A1C
HEMOGLOBIN A1C: 6 % — AB (ref <5.7)
Mean Plasma Glucose: 126 mg/dL — ABNORMAL HIGH (ref <117)

## 2015-10-30 NOTE — Progress Notes (Signed)
Quick Note:  All labs are normal. ______ 

## 2015-11-12 ENCOUNTER — Other Ambulatory Visit: Payer: Self-pay | Admitting: Family Medicine

## 2015-12-03 ENCOUNTER — Telehealth: Payer: Self-pay | Admitting: Family Medicine

## 2015-12-03 DIAGNOSIS — R21 Rash and other nonspecific skin eruption: Secondary | ICD-10-CM

## 2015-12-03 NOTE — Telephone Encounter (Signed)
Okay to place referral for dermatology.

## 2015-12-03 NOTE — Telephone Encounter (Signed)
Pt called office, states she was seen for a rash at her last appointment. States the rash is getting worse and beginning to spread. She would like a referral placed to Derm. Will route to PCP.

## 2015-12-03 NOTE — Telephone Encounter (Signed)
Referral placed, called and spoke with Pt to inquire if she had a dermatologist in mind. She does not, so referral placed internally.

## 2016-01-03 DIAGNOSIS — L239 Allergic contact dermatitis, unspecified cause: Secondary | ICD-10-CM | POA: Diagnosis not present

## 2016-01-14 ENCOUNTER — Other Ambulatory Visit: Payer: Self-pay | Admitting: Physician Assistant

## 2016-01-14 DIAGNOSIS — Z1231 Encounter for screening mammogram for malignant neoplasm of breast: Secondary | ICD-10-CM

## 2016-01-24 ENCOUNTER — Ambulatory Visit (INDEPENDENT_AMBULATORY_CARE_PROVIDER_SITE_OTHER): Payer: Commercial Managed Care - HMO

## 2016-01-24 DIAGNOSIS — Z1231 Encounter for screening mammogram for malignant neoplasm of breast: Secondary | ICD-10-CM | POA: Diagnosis not present

## 2016-03-17 ENCOUNTER — Other Ambulatory Visit: Payer: Self-pay | Admitting: Family Medicine

## 2016-05-09 ENCOUNTER — Other Ambulatory Visit: Payer: Self-pay | Admitting: Family Medicine

## 2016-05-23 ENCOUNTER — Other Ambulatory Visit: Payer: Self-pay | Admitting: *Deleted

## 2016-05-23 MED ORDER — OMEPRAZOLE 20 MG PO CPDR: 20.0000 mg | DELAYED_RELEASE_CAPSULE | Freq: Every day | ORAL | Status: DC

## 2016-06-10 ENCOUNTER — Other Ambulatory Visit: Payer: Self-pay | Admitting: Family Medicine

## 2016-06-11 ENCOUNTER — Other Ambulatory Visit: Payer: Self-pay | Admitting: Family Medicine

## 2016-06-19 ENCOUNTER — Ambulatory Visit (INDEPENDENT_AMBULATORY_CARE_PROVIDER_SITE_OTHER): Payer: Commercial Managed Care - HMO

## 2016-06-19 ENCOUNTER — Ambulatory Visit (INDEPENDENT_AMBULATORY_CARE_PROVIDER_SITE_OTHER): Payer: Commercial Managed Care - HMO | Admitting: Family Medicine

## 2016-06-19 ENCOUNTER — Encounter: Payer: Self-pay | Admitting: Family Medicine

## 2016-06-19 ENCOUNTER — Telehealth: Payer: Self-pay | Admitting: Family Medicine

## 2016-06-19 VITALS — BP 138/85 | HR 105 | Wt 176.0 lb

## 2016-06-19 DIAGNOSIS — N183 Chronic kidney disease, stage 3 unspecified: Secondary | ICD-10-CM

## 2016-06-19 DIAGNOSIS — R0602 Shortness of breath: Secondary | ICD-10-CM

## 2016-06-19 DIAGNOSIS — M25473 Effusion, unspecified ankle: Secondary | ICD-10-CM

## 2016-06-19 DIAGNOSIS — I482 Chronic atrial fibrillation, unspecified: Secondary | ICD-10-CM

## 2016-06-19 DIAGNOSIS — Z79899 Other long term (current) drug therapy: Secondary | ICD-10-CM | POA: Diagnosis not present

## 2016-06-19 DIAGNOSIS — I700 Atherosclerosis of aorta: Secondary | ICD-10-CM

## 2016-06-19 DIAGNOSIS — I509 Heart failure, unspecified: Secondary | ICD-10-CM

## 2016-06-19 DIAGNOSIS — R000 Tachycardia, unspecified: Secondary | ICD-10-CM

## 2016-06-19 DIAGNOSIS — E162 Hypoglycemia, unspecified: Secondary | ICD-10-CM

## 2016-06-19 LAB — CBC WITH DIFFERENTIAL/PLATELET
BASOS ABS: 80 {cells}/uL (ref 0–200)
Basophils Relative: 1 %
EOS ABS: 240 {cells}/uL (ref 15–500)
Eosinophils Relative: 3 %
HCT: 39.6 % (ref 35.0–45.0)
HEMOGLOBIN: 13.2 g/dL (ref 11.7–15.5)
LYMPHS ABS: 3360 {cells}/uL (ref 850–3900)
Lymphocytes Relative: 42 %
MCH: 27.6 pg (ref 27.0–33.0)
MCHC: 33.3 g/dL (ref 32.0–36.0)
MCV: 82.7 fL (ref 80.0–100.0)
MONOS PCT: 8 %
MPV: 9.6 fL (ref 7.5–12.5)
Monocytes Absolute: 640 cells/uL (ref 200–950)
NEUTROS ABS: 3680 {cells}/uL (ref 1500–7800)
Neutrophils Relative %: 46 %
Platelets: 246 10*3/uL (ref 140–400)
RBC: 4.79 MIL/uL (ref 3.80–5.10)
RDW: 14.8 % (ref 11.0–15.0)
WBC: 8 10*3/uL (ref 3.8–10.8)

## 2016-06-19 LAB — COMPLETE METABOLIC PANEL WITH GFR
ALBUMIN: 4.7 g/dL (ref 3.6–5.1)
ALK PHOS: 74 U/L (ref 33–130)
ALT: 12 U/L (ref 6–29)
AST: 16 U/L (ref 10–35)
BILIRUBIN TOTAL: 0.7 mg/dL (ref 0.2–1.2)
BUN: 12 mg/dL (ref 7–25)
CO2: 24 mmol/L (ref 20–31)
Calcium: 9.6 mg/dL (ref 8.6–10.4)
Chloride: 107 mmol/L (ref 98–110)
Creat: 0.99 mg/dL — ABNORMAL HIGH (ref 0.60–0.93)
GFR, Est African American: 65 mL/min (ref >=60)
GFR, Est Non African American: 56 mL/min — ABNORMAL LOW (ref >=60)
GLUCOSE: 103 mg/dL — AB (ref 65–99)
Potassium: 4.8 mmol/L (ref 3.5–5.3)
SODIUM: 142 mmol/L (ref 135–146)
TOTAL PROTEIN: 7.4 g/dL (ref 6.1–8.1)

## 2016-06-19 LAB — URINALYSIS
Bilirubin Urine: NEGATIVE
Glucose, UA: NEGATIVE
Hgb urine dipstick: NEGATIVE
Ketones, ur: NEGATIVE
NITRITE: NEGATIVE
Protein, ur: NEGATIVE
SPECIFIC GRAVITY, URINE: 1.015 (ref 1.001–1.035)
pH: 5.5 (ref 5.0–8.0)

## 2016-06-19 LAB — LIPID PANEL
CHOL/HDL RATIO: 2.6 ratio (ref <=5.0)
Cholesterol: 136 mg/dL (ref 125–200)
HDL: 52 mg/dL (ref >=46)
LDL Cholesterol: 58 mg/dL (ref <130)
Triglycerides: 131 mg/dL (ref <150)
VLDL: 26 mg/dL (ref <30)

## 2016-06-19 LAB — TSH: TSH: 2.49 mIU/L

## 2016-06-19 LAB — D-DIMER, QUANTITATIVE: D-Dimer, Quant: 0.57 mcg/mL FEU — ABNORMAL HIGH (ref <0.50)

## 2016-06-19 LAB — BRAIN NATRIURETIC PEPTIDE: Brain Natriuretic Peptide: 263 pg/mL — ABNORMAL HIGH (ref <100)

## 2016-06-19 MED ORDER — APIXABAN 5 MG PO TABS: 5.0000 mg | ORAL_TABLET | Freq: Two times a day (BID) | ORAL | Status: DC

## 2016-06-19 MED ORDER — METOPROLOL SUCCINATE ER 25 MG PO TB24: 25.0000 mg | ORAL_TABLET | Freq: Every day | ORAL | Status: DC

## 2016-06-19 MED ORDER — FUROSEMIDE 20 MG PO TABS: 20.0000 mg | ORAL_TABLET | Freq: Every day | ORAL | Status: DC

## 2016-06-19 NOTE — Telephone Encounter (Signed)
Called and lvm informing pt of results and recommendations. Asked that she return call regarding these results and to schedule an appt with Dr. Linford ArnoldMetheney next week to further discuss these results and recommendations.Loralee PacasBarkley, Nalleli Largent SparkmanLynetta

## 2016-06-19 NOTE — Telephone Encounter (Signed)
Please call patient and let her know that I reviewed her EKG. She has an arrhythmia, atrial fibrillation. This does put her at risk of developing something a blood clot. I'm going to start her on a blood thinner called Eliquis as well as a beta blocker which is a type of blood pressure to slow the heart rate down since it is going quite fast. She will need to cut the amlodipine in half to compensate for this. So just take a half a tab daily. I'm going to work on getting her in with a cardiologist as well. Hopefully we can get her in in the next couple of weeks.

## 2016-06-19 NOTE — Progress Notes (Signed)
Subjective:    CC: SOB  HPI:  SOB with activity since May. Started to get worse. 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 She is noticing now that when she gets to the top of the stairs she is out of breath..  She didn't use the inhaler.  Feels like her SOB really started about 20 years ago.  Started getting some ankle swelling more recentlyAfter she took a vacation where she did a lot of walking..She says the swelling is actually a little bit better today but still prominent on her right foot.  Occ palpitations. She's become more short of breath walking short distances to the point that she cannot talk while walking and even has to stop and rest for minute. She says occasionally she'll get some chest discomfort but it's usually just seconds. She denies any pressure type sensation over the chest. She denies any change as far as cough or sputum production. No fevers chills or sweats.  She most wondered if the shortness of breath was possibly related to low blood sugar. She noticed that she sometimes skips lunch or a very late she would start to feel very weak and tired and would have to lay down for a few minutes and then as soon as she would eat something she would feel better.  She says her last breathing test was in 2004.  Past medical history, Surgical history, Family history not pertinant except as noted below, Social history, Allergies, and medications have been entered into the medical record, reviewed, and corrections made.   Review of Systems: No fevers, chills, night sweats, weight loss.   Objective:    General: Well Developed, well nourished, and in no acute distress.  Neuro: Alert and oriented x3, extra-ocular muscles intact, sensation grossly intact.  HEENT: Normocephalic, atraumatic  Skin: Warm and dry, no rashes. Cardiac: Rhythm sounds regular but goes from being very tachycardic and then will slow down for a few seconds and then become  tachycardic again., no murmurs rubs or gallops,  Ext: she does have some trace pitting edema on the top of the right foot. The left foot looks good. No ankle edema. Respiratory: Clear to auscultation bilaterally. Not using accessory muscles, speaking in full sentences.   Impression and Recommendations:   SOB - Unclear etiology. Certainly this sounds more chronic in nature but symptoms have increased more recently. Will evaluate for blood clot with a d-dimer, check CBC for anemia. Will check for thyroid disorder. Will check for electrolyte disturbance. We'll also get a chest x-ray for further evaluation.  Ankle swelling-we'll check urine for proteinuria.   Tachycardia - will get EKG today.  EKG today shows rate of 131 bpm with irregularly irregular rhythm. It looks most consistent with atrial fibrillation. Nonspecific T-wave abnormality. Normal axis.  Atrial fibrillation-new diagnosis. Will need to start her on a low-dose beta blocker as well as an anticoagulant and refer her to cardiology for further evaluation. Start metoprolol 25mXL daily. Start Eliquis 5mg  po BID. Cut amlodipine in half.

## 2016-06-20 ENCOUNTER — Telehealth: Payer: Self-pay

## 2016-06-20 LAB — HEMOGLOBIN A1C
Hgb A1c MFr Bld: 5.8 % — ABNORMAL HIGH
Mean Plasma Glucose: 120 mg/dL

## 2016-06-20 NOTE — Telephone Encounter (Signed)
She can hold off until she gets back as long as her swelling does get worse.

## 2016-06-23 NOTE — Telephone Encounter (Signed)
Spoke with Rebekah Harrell and reviewed her results.  She has picked up the medication and has started it. She did call cardiology but was not able to get an appointment until [facility]. So we will see if we can get [facility] for her first appointment and then can schedule all follow[facility].

## 2016-06-25 ENCOUNTER — Telehealth: Payer: Self-pay

## 2016-06-25 DIAGNOSIS — I4819 Other persistent atrial fibrillation: Secondary | ICD-10-CM

## 2016-06-25 LAB — ALPHA-1-ANTITRYPSIN: A-1 Antitrypsin, Ser: 167 mg/dL (ref 83–199)

## 2016-06-25 NOTE — Telephone Encounter (Signed)
Dr Linford ArnoldMetheney wanted patient to have a sooner appointment. Patient has been scheduled with Dr. Penni BombardKenneth B. Rhinehart, MD [place], [facility] Address: [address], [place], [place] [address] Phone: [phone] Fax: [phone]. Patient has an appointment on August 3 rd at 1:45 pm. Please fax notes and most recent EKG.

## 2016-07-01 NOTE — Telephone Encounter (Signed)
That's perfectly fine. Referral placed for Dr. Jens Somrenshaw. We will take his first available appointment whether it at [facility] [place] or [place] and then all follow-ups after that can be in [place]. She will need to call Dr. Alessandra Grouteinhardt's office to cancel her appointment.

## 2016-07-01 NOTE — Telephone Encounter (Signed)
Left a message for a return call.

## 2016-07-01 NOTE — Telephone Encounter (Signed)
Pt would prefer Dr. Jens Somrenshaw. Pt states she looked up Dr. Boneta Luckshinehart online and he had "terrible reviews." Pt states one of the reviews stating "they would not recommend Dr. Boneta Luckshinehart to take care of animals in [place]." Will route to our referral coordinator to see if appt can be changed back to Dr. Jens Somrenshaw.

## 2016-07-02 ENCOUNTER — Other Ambulatory Visit: Payer: Self-pay | Admitting: Family Medicine

## 2016-07-02 NOTE — Telephone Encounter (Signed)
Referral coordinator is working on this.

## 2016-07-07 ENCOUNTER — Ambulatory Visit: Payer: Commercial Managed Care - HMO | Admitting: Family Medicine

## 2016-07-07 ENCOUNTER — Ambulatory Visit (INDEPENDENT_AMBULATORY_CARE_PROVIDER_SITE_OTHER): Payer: Commercial Managed Care - HMO | Admitting: Family Medicine

## 2016-07-07 VITALS — BP 138/86 | HR 113 | Ht 63.0 in | Wt 178.5 lb

## 2016-07-07 DIAGNOSIS — R05 Cough: Secondary | ICD-10-CM | POA: Diagnosis not present

## 2016-07-07 DIAGNOSIS — J4540 Moderate persistent asthma, uncomplicated: Secondary | ICD-10-CM | POA: Diagnosis not present

## 2016-07-07 DIAGNOSIS — M25473 Effusion, unspecified ankle: Secondary | ICD-10-CM | POA: Diagnosis not present

## 2016-07-07 DIAGNOSIS — I700 Atherosclerosis of aorta: Secondary | ICD-10-CM | POA: Diagnosis not present

## 2016-07-07 DIAGNOSIS — I482 Chronic atrial fibrillation, unspecified: Secondary | ICD-10-CM

## 2016-07-07 DIAGNOSIS — R059 Cough, unspecified: Secondary | ICD-10-CM

## 2016-07-07 MED ORDER — METOPROLOL SUCCINATE ER 50 MG PO TB24: 50.0000 mg | ORAL_TABLET | Freq: Every day | ORAL | Status: DC

## 2016-07-07 MED ORDER — ALBUTEROL SULFATE (2.5 MG/3ML) 0.083% IN NEBU
2.5000 mg | INHALATION_SOLUTION | Freq: Once | RESPIRATORY_TRACT | Status: AC
  Administered 2016-07-07: 2.5 mg via RESPIRATORY_TRACT

## 2016-07-07 NOTE — Progress Notes (Signed)
Subjective:    CC: spiromerty   HPI:  Patient comes in today to follow-up on 2 concerns. When I last saw her she complained of increased shortness of breath and easy fatigability. EKG showed atrial fibrillation.  I started her on Eliquis and metoprolol to start controlling her rate and had her cut her amlodipine in half. She had also complained of some significant swelling of the lower studies for couple weeks I had started her on Lasix. She doesn't really feel like the swelling has responded much to the Lasix but she does feel like her overall shortness of breath and breathing has gotten a little bit better over the last week. She did drive [place] and back..  Past medical history, Surgical history, Family history not pertinant except as noted below, Social history, Allergies, and medications have been entered into the medical record, reviewed, and corrections made.   Review of Systems: No fevers, chills, night sweats, weight loss, chest pain, or shortness of breath.   Objective:    General: Well Developed, well nourished, and in no acute distress.  Neuro: Alert and oriented x3, extra-ocular muscles intact, sensation grossly intact.  HEENT: Normocephalic, atraumatic  Skin: Warm and dry, no rashes. Cardiac: Irregularly irregular rhythm, no murmurs rubs or gallops, no lower extremity edema.  Respiratory: Clear to auscultation bilaterally. Not using accessory muscles, speaking in full sentences.   Impression and Recommendations:   Asthma-spirometry shows FVC of 74%, FEV1 of 61% and ratio exceeds 2%. Postbronchodilator she had improvement in FEV1 of 17% and FEF 25-75% of 48%. This is most consistent with asthma, I recommend that we put her on an inhaled steroid such as Flovent or Asmanex. She was check with her insurance on coverage first. She does have albuterol inhaler which she can use as needed. But she also wants to check to see which of-year-old may be the cheapest on her  insurance plan. Next  Atrial fibrillation-she does have an appointment at the end of August for Dr. Olga MillersBrian Crenshaw. She is on a cancellation list to hopefully will be able to get in sooner. Pulse is running in the 90s some increase her metoprolol to 50 mg for better rate control that she is symptomatically Artie feeling a little bit better. She is still in atrial fibrillation today. Depending on her BMP results we might be able to increase her Lasix to 40 mg as well. Consider going ahead and getting echocardiogram for further evaluation as well.  Aortic atherosclerosis-this was also noted on the chest x-ray that she had. She did have some questions about this today. She is currently on simvastatin. Discussed at some point we may try switching her to atorvastatin. She Concerta use up the prescription that she has.

## 2016-07-07 NOTE — Progress Notes (Signed)
Patient came into clinic today for spirometry. Pt states she is doing this breathing test because she suffers from COPD and chronic cough. Pt was able to preform the test well, no immediate complications. Pt was moved into a room where she will go over the results with PCP.

## 2016-07-07 NOTE — Patient Instructions (Addendum)
See if your insurance will cover Flovent or Asthmanex.   Increase you metoprolol to 50 mg.   Keep your amlodipine at half a tab.

## 2016-07-08 LAB — BASIC METABOLIC PANEL WITH GFR
BUN: 17 mg/dL (ref 7–25)
CALCIUM: 9 mg/dL (ref 8.6–10.4)
CO2: 23 mmol/L (ref 20–31)
CREATININE: 1.04 mg/dL — AB (ref 0.60–0.93)
Chloride: 105 mmol/L (ref 98–110)
GFR, EST AFRICAN AMERICAN: 61 mL/min (ref >=60)
GFR, EST NON AFRICAN AMERICAN: 53 mL/min — AB (ref >=60)
Glucose, Bld: 90 mg/dL (ref 65–99)
Potassium: 3.7 mmol/L (ref 3.5–5.3)
SODIUM: 143 mmol/L (ref 135–146)

## 2016-07-08 MED ORDER — POTASSIUM CHLORIDE ER 10 MEQ PO TBCR: 10.0000 meq | EXTENDED_RELEASE_TABLET | Freq: Every day | ORAL | Status: DC

## 2016-07-08 NOTE — Progress Notes (Signed)
error 

## 2016-08-04 ENCOUNTER — Ambulatory Visit (INDEPENDENT_AMBULATORY_CARE_PROVIDER_SITE_OTHER): Payer: Commercial Managed Care - HMO | Admitting: Family Medicine

## 2016-08-04 VITALS — BP 126/60 | HR 102 | Ht 63.0 in | Wt 174.0 lb

## 2016-08-04 DIAGNOSIS — J4540 Moderate persistent asthma, uncomplicated: Secondary | ICD-10-CM

## 2016-08-04 DIAGNOSIS — I10 Essential (primary) hypertension: Secondary | ICD-10-CM

## 2016-08-04 DIAGNOSIS — I482 Chronic atrial fibrillation, unspecified: Secondary | ICD-10-CM

## 2016-08-04 DIAGNOSIS — N183 Chronic kidney disease, stage 3 unspecified: Secondary | ICD-10-CM

## 2016-08-04 DIAGNOSIS — M150 Primary generalized (osteo)arthritis: Secondary | ICD-10-CM

## 2016-08-04 DIAGNOSIS — M159 Polyosteoarthritis, unspecified: Secondary | ICD-10-CM

## 2016-08-04 MED ORDER — ALBUTEROL SULFATE HFA 108 (90 BASE) MCG/ACT IN AERS: INHALATION_SPRAY | RESPIRATORY_TRACT | 4 refills | Status: DC

## 2016-08-04 MED ORDER — POTASSIUM CHLORIDE ER 10 MEQ PO TBCR: 10.0000 meq | EXTENDED_RELEASE_TABLET | Freq: Every day | ORAL | 2 refills | Status: DC

## 2016-08-04 NOTE — Progress Notes (Signed)
Subjective:    CC:   HPI:  Afib - she is on Eliquis.  Cardiology is on August 25. She does feel like her pulse has been under better control since going up on her metoprolol. She's also noticed her lower extremity swelling has gotten a lot better. In fact she wants to know if she can start decreasing her Lasix.  Hypertension- Pt denies chest pain, SOB, dizziness, or heart palpitations.  Taking meds as directed w/o problems.  Denies medication side effects.    She wants to know if she can take meloxicam or NSAID for pain relief. She says the tramadol is causing night sweats and bad dreams and really wants to get off of it.  Asthma-she feels like the prior is not really working. She still has a sample that I gave her but feels like it's not actually dispensing medication. And she really doesn't note any notice any symptomatic relief when she uses it either.  Past medical history, Surgical history, Family history not pertinant except as noted below, Social history, Allergies, and medications have been entered into the medical record, reviewed, and corrections made.   Review of Systems: No fevers, chills, night sweats, weight loss, chest pain, or shortness of breath.   Objective:    General: Well Developed, well nourished, and in no acute distress.  Neuro: Alert and oriented x3, extra-ocular muscles intact, sensation grossly intact.  HEENT: Normocephalic, atraumatic  Skin: Warm and dry, no rashes. Cardiac: Regular rate and rhythm, no murmurs rubs or gallops, no lower extremity edema.  Respiratory: Clear to auscultation bilaterally. Not using accessory muscles, speaking in full sentences.   Impression and Recommendations:    Atrial fibrillation-overall she is doing better and feeling much less symptomatic. She is doing well on the increased dose of the metoprolol. Pulse is still around 200. That she says some days it's in the 90s at home. She also feels like her looks any swelling is much  improved and is ready to start using the Lasix a little bit more as needed. She will only need to take her potassium when she takes her Lasix. Next  Hypertension-well controlled. Continue current regimen.  Arthritis-unfortunately she will not be able to use an NSAID well on Eliquis. Explained about increased bleeding risk and really at this point she should focus on using Tylenol. We will add tramadol to her intolerance list. We'll try to avoid narcotics. And at some point if she is able to come off of blood thinners and she may be a good back to using NSAIDs occasionally.  Asthma-we'll try switching to Ventolin.

## 2016-08-07 ENCOUNTER — Encounter: Payer: Self-pay | Admitting: Cardiology

## 2016-08-07 ENCOUNTER — Telehealth: Payer: Self-pay | Admitting: Cardiology

## 2016-08-07 NOTE — Telephone Encounter (Signed)
Called pt and left message asking pt to call back to update Fm and Medical Hx. °

## 2016-08-08 ENCOUNTER — Other Ambulatory Visit: Payer: Self-pay | Admitting: *Deleted

## 2016-08-08 MED ORDER — ALBUTEROL SULFATE HFA 108 (90 BASE) MCG/ACT IN AERS: 2.0000 | INHALATION_SPRAY | Freq: Four times a day (QID) | RESPIRATORY_TRACT | 4 refills | Status: DC | PRN

## 2016-08-19 ENCOUNTER — Other Ambulatory Visit: Payer: Self-pay | Admitting: Family Medicine

## 2016-08-21 ENCOUNTER — Ambulatory Visit (INDEPENDENT_AMBULATORY_CARE_PROVIDER_SITE_OTHER): Payer: Commercial Managed Care - HMO | Admitting: Cardiology

## 2016-08-21 ENCOUNTER — Encounter: Payer: Self-pay | Admitting: Cardiology

## 2016-08-21 VITALS — BP 136/76 | HR 59

## 2016-08-21 DIAGNOSIS — I10 Essential (primary) hypertension: Secondary | ICD-10-CM | POA: Diagnosis not present

## 2016-08-21 DIAGNOSIS — E785 Hyperlipidemia, unspecified: Secondary | ICD-10-CM

## 2016-08-21 DIAGNOSIS — I4891 Unspecified atrial fibrillation: Secondary | ICD-10-CM | POA: Diagnosis not present

## 2016-08-21 NOTE — Progress Notes (Signed)
HPI: 75 year old female for evaluation of atrial fibrillation. Patient with new diagnosis of atrial fibrillation in June 2017. Placed on apixaban and metoprolol. Stress echocardiogram 2011 showed poor exercise tolerance but no ischemia. Echocardiogram June 2012 showed normal LV function and mild left atrial enlargement. Laboratories June 2017 showed creatinine 0.99 and TSH 2.49. Hemoglobin 13.2. Patient has chronic dyspnea on exertion but no orthopnea, PND, chest pain, palpitations or bleeding. She has minimal pedal edema over the past 2 months. This has improved with diuretics.  PAD Screen 08/21/2016 08/21/2016  Previous PAD dx? No No  Previous surgical procedure? No No  Pain with walking? No Yes  Subsides with rest? No Yes  Feet/toe relief with dangling? No No  Painful, non-healing ulcers? No No  Extremities discolored? No No      Current Outpatient Prescriptions  Medication Sig Dispense Refill  . albuterol (VENTOLIN HFA) 108 (90 Base) MCG/ACT inhaler Inhale 2 puffs into the lungs every 6 (six) hours as needed for wheezing or shortness of breath. 2-4 puffs every four to six hours as needed for wheezing 3 each 4  . amLODipine (NORVASC) 5 MG tablet TAKE 1 TABLET EVERY DAY 90 tablet 1  . apixaban (ELIQUIS) 5 MG TABS tablet Take 1 tablet (5 mg total) by mouth 2 (two) times daily. 60 tablet 0  . enalapril (VASOTEC) 20 MG tablet TAKE 1 TABLET EVERY DAY 90 tablet 1  . furosemide (LASIX) 20 MG tablet Take 1 tablet (20 mg total) by mouth daily. 30 tablet 1  . metoprolol succinate (TOPROL-XL) 25 MG 24 hr tablet     . omeprazole (PRILOSEC) 20 MG capsule Take 1 capsule (20 mg total) by mouth daily. 90 capsule 3  . potassium chloride (K-DUR) 10 MEQ tablet Take 1 tablet (10 mEq total) by mouth daily. 90 tablet 2  . simvastatin (ZOCOR) 20 MG tablet TAKE 1 TABLET (20 MG TOTAL) BY MOUTH EVERY EVENING. 90 tablet 4   No current facility-administered medications for this visit.     Allergies    Allergen Reactions  . Tramadol Other (See Comments)    Night sweats, bad dreams  . Celecoxib     REACTION: hair loss  . Cetirizine Hcl     REACTION: fatigue  . Penicillins   . Sulfonamide Derivatives     REACTION: RASH     Past Medical History:  Diagnosis Date  . Aortic atherosclerosis (HCC)   . Asthma   . Chronic atrial fibrillation (HCC)   . CKD (chronic kidney disease), stage III   . Hyperlipidemia   . Hypertension   . Hypoglycemia     Past Surgical History:  Procedure Laterality Date  . CHOLECYSTECTOMY    . TONSILLECTOMY      Social History   Social History  . Marital status: Divorced    Spouse name: N/A  . Number of children: 3  . Years of education: N/A   Occupational History  . Not on file.   Social History Main Topics  . Smoking status: Never Smoker  . Smokeless tobacco: Never Used  . Alcohol use Yes     Comment: Rare  . Drug use: No  . Sexual activity: No   Other Topics Concern  . Not on file   Social History Narrative  . No narrative on file    Family History  Problem Relation Age of Onset  . CAD Mother   . Colon cancer Father     ROS: no fevers or chills,  productive cough, hemoptysis, dysphasia, odynophagia, melena, hematochezia, dysuria, hematuria, rash, seizure activity, orthopnea, PND, pedal edema, claudication. Remaining systems are negative.  Physical Exam:   Blood pressure 136/76, pulse (!) 59.  General:  Well developed/well nourished in NAD Skin warm/dry Patient not depressed No peripheral clubbing Back-normal HEENT-normal/normal eyelids Neck supple/normal carotid upstroke bilaterally; no bruits; no JVD; no thyromegaly chest - CTA/ normal expansion CV - RRR/normal S1 and S2; no murmurs, rubs or gallops;  PMI nondisplaced Abdomen -NT/ND, no HSM, no mass, + bowel sounds, no bruit 2+ femoral pulses, no bruits Ext-no edema, chords, 2+ DP Neuro-grossly nonfocal  ECG 06/19/2016-atrial fibrillation with PVCs or aberrantly  conducted beats and nonspecific ST changes.  A/P  1 Atrial fibrillation-patient has newly diagnosed atrial fibrillation. However the duration is unknown. It does not appear she is significantly symptomatic with this. She does have dyspnea on exertion but she has had this for years by her report. She has had minimal pedal edema in the past 2 months. We will continue beta blocker for rate control. CHADSvasc 3. We'll continue apixaban. I discussed rate control versus rhythm control. She would like to consider this before proceeding. If she elects cardioversion attempt we will arrange. However if atrial fibrillation recurred I would favor rate control as she is essentially asymptomatic. LV function is normal. TSH normal. We will arrange a 24-hour Holter monitor to make sure that rate is adequately controlled.  2 hypertension-blood pressure controlled. Continue present medications.  3 hyperlipidemia-continue statin.  Olga MillersBrian Addalynn Kumari, MD

## 2016-08-21 NOTE — Patient Instructions (Signed)
Medication Instructions:   NO CHANGES.  Testing/Procedures: Your physician has recommended that you wear a 24 holter monitor. Holter monitors are medical devices that record the heart's electrical activity. Doctors most often use these monitors to diagnose arrhythmias. Arrhythmias are problems with the speed or rhythm of the heartbeat. The monitor is a small, portable device. You can wear one while you do your normal daily activities. This is usually used to diagnose what is causing palpitations/syncope (passing out).    Follow-Up: Your physician recommends that you schedule a follow-up appointment in: 3 MONTHS  WITH DR Jens SomRENSHAW IN [place].   If you need a refill on your cardiac medications before your next appointment, please call your pharmacy.

## 2016-08-27 ENCOUNTER — Ambulatory Visit: Payer: Commercial Managed Care - HMO | Admitting: Cardiology

## 2016-08-28 ENCOUNTER — Other Ambulatory Visit: Payer: Self-pay | Admitting: Family Medicine

## 2016-08-28 DIAGNOSIS — I482 Chronic atrial fibrillation, unspecified: Secondary | ICD-10-CM

## 2016-09-01 ENCOUNTER — Ambulatory Visit (INDEPENDENT_AMBULATORY_CARE_PROVIDER_SITE_OTHER): Payer: Commercial Managed Care - HMO

## 2016-09-01 DIAGNOSIS — I4891 Unspecified atrial fibrillation: Secondary | ICD-10-CM

## 2016-09-08 ENCOUNTER — Telehealth: Payer: Self-pay | Admitting: *Deleted

## 2016-09-08 NOTE — Telephone Encounter (Signed)
Left message for pt to call.

## 2016-09-08 NOTE — Telephone Encounter (Signed)
-----   Message from Lewayne BuntingBrian S Crenshaw, MD sent at 09/08/2016  1:03 PM EDT ----- Afib with elevated HR; DC amlodipine; change toprol to 75 mg daily Olga MillersBrian Crenshaw

## 2016-09-10 NOTE — Telephone Encounter (Signed)
Follow up  Pt voiced she is returning nurses call about results.  Please f/u with pt

## 2016-09-11 MED ORDER — METOPROLOL SUCCINATE ER 25 MG PO TB24: 75.0000 mg | ORAL_TABLET | Freq: Every day | ORAL | 3 refills | Status: DC

## 2016-09-11 NOTE — Telephone Encounter (Signed)
Returned call to patient-aware of results and medication changes.   Notes Recorded by Lewayne BuntingBrian S Crenshaw, MD on 09/08/2016 at 1:03 PM EDT Afib with elevated HR; DC amlodipine; change toprol to 75 mg daily Olga MillersBrian Crenshaw   Advised to monitor HR and BP.  Med list updated.  New rx sent to pharmacy.  Pt verbalized understanding.  Advised to call with further questions/concerns.

## 2016-09-16 ENCOUNTER — Telehealth: Payer: Self-pay | Admitting: *Deleted

## 2016-09-16 NOTE — Telephone Encounter (Signed)
PA for metoprolol succ approved

## 2016-10-10 ENCOUNTER — Other Ambulatory Visit: Payer: Self-pay

## 2016-10-10 MED ORDER — METOPROLOL SUCCINATE ER 25 MG PO TB24: 75.0000 mg | ORAL_TABLET | Freq: Every day | ORAL | 3 refills | Status: DC

## 2016-10-14 ENCOUNTER — Telehealth: Payer: Self-pay

## 2016-10-14 NOTE — Telephone Encounter (Signed)
Called patient to clarify how she take Metoprolol Succ ER 25 mg for refill. Not able to leave message.

## 2016-10-16 ENCOUNTER — Other Ambulatory Visit: Payer: Self-pay

## 2016-10-16 MED ORDER — METOPROLOL SUCCINATE ER 25 MG PO TB24: 75.0000 mg | ORAL_TABLET | Freq: Every day | ORAL | 3 refills | Status: DC

## 2016-11-05 ENCOUNTER — Other Ambulatory Visit: Payer: Self-pay | Admitting: Family Medicine

## 2016-11-05 DIAGNOSIS — I482 Chronic atrial fibrillation, unspecified: Secondary | ICD-10-CM

## 2016-11-21 NOTE — Progress Notes (Signed)
HPI: FU atrial fibrillation.  Patient with new diagnosis of atrial fibrillation in June 2017. Placed on apixaban and metoprolol. Stress echocardiogram 2011 showed poor exercise tolerance but no ischemia. Echocardiogram June 2012 showed normal LV function and mild left atrial enlargement. Laboratories June 2017 showed creatinine 0.99 and TSH 2.49. Hemoglobin 13.2. Holter 9/17 showed afib with elevated rate; toprol increased. Since last seen, patient has had worsening dyspnea on exertion. She now gets dyspneic walking in the house. She has orthopnea and pedal edema. No chest pain, palpitations, syncope or bleeding.  Current Outpatient Prescriptions  Medication Sig Dispense Refill  . ELIQUIS 5 MG TABS tablet TAKE 1 TABLET(5 MG) BY MOUTH TWICE DAILY 60 tablet 1  . enalapril (VASOTEC) 20 MG tablet TAKE 1 TABLET EVERY DAY 90 tablet 1  . furosemide (LASIX) 20 MG tablet Take 1 tablet (20 mg total) by mouth daily. (Patient taking differently: Take 20 mg by mouth as directed. ) 30 tablet 1  . metoprolol succinate (TOPROL-XL) 25 MG 24 hr tablet Take 3 tablets (75 mg total) by mouth daily. Take with or immediately following a meal. 270 tablet 3  . omeprazole (PRILOSEC) 20 MG capsule Take 1 capsule (20 mg total) by mouth daily. 90 capsule 3  . potassium chloride (K-DUR) 10 MEQ tablet Take 1 tablet (10 mEq total) by mouth daily. (Patient taking differently: Take 10 mEq by mouth as directed. ) 90 tablet 2  . simvastatin (ZOCOR) 20 MG tablet TAKE 1 TABLET (20 MG TOTAL) BY MOUTH EVERY EVENING. 90 tablet 4   No current facility-administered medications for this visit.      Past Medical History:  Diagnosis Date  . Aortic atherosclerosis (HCC)   . Asthma   . Chronic atrial fibrillation (HCC)   . CKD (chronic kidney disease), stage III   . Hyperlipidemia   . Hypertension   . Hypoglycemia     Past Surgical History:  Procedure Laterality Date  . CHOLECYSTECTOMY    . TONSILLECTOMY      Social  History   Social History  . Marital status: Divorced    Spouse name: N/A  . Number of children: 3  . Years of education: N/A   Occupational History  . Not on file.   Social History Main Topics  . Smoking status: Never Smoker  . Smokeless tobacco: Never Used  . Alcohol use Yes     Comment: Rare  . Drug use: No  . Sexual activity: No   Other Topics Concern  . Not on file   Social History Narrative  . No narrative on file    Family History  Problem Relation Age of Onset  . CAD Mother     HEART ATTACK  . Colon cancer Father     KIDNEY FAILURE/COLON CANCER  . Atrial fibrillation Brother   . Heart failure Maternal Grandmother   . Kidney failure Maternal Grandfather   . Heart attack Paternal Grandmother   . Healthy Brother     ROS: no fevers or chills, productive cough, hemoptysis, dysphasia, odynophagia, melena, hematochezia, dysuria, hematuria, rash, seizure activity, claudication. Remaining systems are negative.  Physical Exam: Well-developed well-nourished in no acute distress.  Skin is warm and dry.  HEENT is normal. Normal eyelids. Neck is supple. No thyromegaly. Normal carotid upstroke with no bruits. Chest is clear to auscultation with normal expansion.  Cardiovascular exam is tachycardic and irregular, no murmurs rubs or gallops.  Abdominal exam nontender or distended. No masses palpated.No hepatosplenomegaly.  2+ femoral pulses.   Extremities show 1+ edema. neuro grossly intact  ECG-Atrial fibrillation at a rate of 131. Right axis deviation. Nonspecific ST changes.  A/P  1 persistent atrial fibrillation- continue apixiban. Patient remains in persistent atrial fibrillation. Her rate is elevated and she is extremely symptomatic with increasing dyspnea on exertion, orthopnea and pedal edema. She is having difficulty sleeping at night. I think she needs to be admitted and rate controlled. We will continue Toprol. Add Cardizem. Hopefully controlling heart rate  will improve symptoms. If not we could consider trying cardioversion although she has been in atrial fibrillation for a prolonged period of time. She has not missed any doses of apixaban. Repeat echocardiogram. Check TSH.   2 acute diastolic congestive heart failure-patient is volume overloaded on examination. Add Lasix 40 mg IV daily and follow renal function. This is likely related to atrial fibrillation with uncontrolled heart rate. However she may have also developed a tachycardia mediated cardiomyopathy. Check echocardiogram for LV function. If LV function reduced she may need ischemia evaluation.  3 hypertension-blood pressure is elevated. We will add Cardizem to help both with blood pressure and rate control. Diuresis should also help.   4 hyperlipidemia-continue statiin.   Olga MillersBrian Crenshaw, MD

## 2016-11-26 ENCOUNTER — Ambulatory Visit (INDEPENDENT_AMBULATORY_CARE_PROVIDER_SITE_OTHER): Payer: Commercial Managed Care - HMO | Admitting: Cardiology

## 2016-11-26 ENCOUNTER — Inpatient Hospital Stay (HOSPITAL_COMMUNITY)
Admission: AD | Admit: 2016-11-26 | Discharge: 2016-12-01 | DRG: 308 | Disposition: A | Discharge status: Home / Self Care | Payer: Commercial Managed Care - HMO | Source: Ambulatory Visit | Attending: Cardiology | Admitting: Cardiology

## 2016-11-26 ENCOUNTER — Encounter: Payer: Self-pay | Admitting: Cardiology

## 2016-11-26 ENCOUNTER — Encounter (HOSPITAL_COMMUNITY): Payer: Self-pay | Admitting: General Practice

## 2016-11-26 VITALS — BP 178/116 | HR 131 | Ht 63.0 in | Wt 178.4 lb

## 2016-11-26 DIAGNOSIS — Z79899 Other long term (current) drug therapy: Secondary | ICD-10-CM

## 2016-11-26 DIAGNOSIS — R0600 Dyspnea, unspecified: Secondary | ICD-10-CM | POA: Diagnosis present

## 2016-11-26 DIAGNOSIS — I482 Chronic atrial fibrillation: Secondary | ICD-10-CM | POA: Diagnosis not present

## 2016-11-26 DIAGNOSIS — I429 Cardiomyopathy, unspecified: Secondary | ICD-10-CM | POA: Diagnosis not present

## 2016-11-26 DIAGNOSIS — E876 Hypokalemia: Secondary | ICD-10-CM | POA: Diagnosis present

## 2016-11-26 DIAGNOSIS — I4581 Long QT syndrome: Secondary | ICD-10-CM | POA: Diagnosis present

## 2016-11-26 DIAGNOSIS — Z7901 Long term (current) use of anticoagulants: Secondary | ICD-10-CM | POA: Diagnosis not present

## 2016-11-26 DIAGNOSIS — Z8249 Family history of ischemic heart disease and other diseases of the circulatory system: Secondary | ICD-10-CM | POA: Diagnosis not present

## 2016-11-26 DIAGNOSIS — I5043 Acute on chronic combined systolic (congestive) and diastolic (congestive) heart failure: Secondary | ICD-10-CM | POA: Diagnosis not present

## 2016-11-26 DIAGNOSIS — I130 Hypertensive heart and chronic kidney disease with heart failure and stage 1 through stage 4 chronic kidney disease, or unspecified chronic kidney disease: Secondary | ICD-10-CM | POA: Diagnosis present

## 2016-11-26 DIAGNOSIS — E785 Hyperlipidemia, unspecified: Secondary | ICD-10-CM | POA: Diagnosis not present

## 2016-11-26 DIAGNOSIS — R000 Tachycardia, unspecified: Secondary | ICD-10-CM | POA: Diagnosis present

## 2016-11-26 DIAGNOSIS — I4891 Unspecified atrial fibrillation: Secondary | ICD-10-CM | POA: Diagnosis present

## 2016-11-26 DIAGNOSIS — R001 Bradycardia, unspecified: Secondary | ICD-10-CM | POA: Diagnosis present

## 2016-11-26 DIAGNOSIS — I5041 Acute combined systolic (congestive) and diastolic (congestive) heart failure: Secondary | ICD-10-CM | POA: Diagnosis not present

## 2016-11-26 DIAGNOSIS — I5023 Acute on chronic systolic (congestive) heart failure: Secondary | ICD-10-CM | POA: Diagnosis not present

## 2016-11-26 DIAGNOSIS — I481 Persistent atrial fibrillation: Secondary | ICD-10-CM | POA: Diagnosis not present

## 2016-11-26 DIAGNOSIS — R9431 Abnormal electrocardiogram [ECG] [EKG]: Secondary | ICD-10-CM | POA: Diagnosis not present

## 2016-11-26 DIAGNOSIS — E7800 Pure hypercholesterolemia, unspecified: Secondary | ICD-10-CM

## 2016-11-26 DIAGNOSIS — R0609 Other forms of dyspnea: Secondary | ICD-10-CM | POA: Diagnosis present

## 2016-11-26 DIAGNOSIS — N183 Chronic kidney disease, stage 3 (moderate): Secondary | ICD-10-CM | POA: Diagnosis not present

## 2016-11-26 DIAGNOSIS — J45909 Unspecified asthma, uncomplicated: Secondary | ICD-10-CM | POA: Diagnosis present

## 2016-11-26 DIAGNOSIS — I5031 Acute diastolic (congestive) heart failure: Secondary | ICD-10-CM | POA: Diagnosis not present

## 2016-11-26 DIAGNOSIS — K219 Gastro-esophageal reflux disease without esophagitis: Secondary | ICD-10-CM | POA: Diagnosis present

## 2016-11-26 DIAGNOSIS — I10 Essential (primary) hypertension: Secondary | ICD-10-CM

## 2016-11-26 HISTORY — DX: Cardiac murmur, unspecified: R01.1

## 2016-11-26 HISTORY — DX: Adverse effect of unspecified anesthetic, initial encounter: T41.45XA

## 2016-11-26 HISTORY — DX: Gastro-esophageal reflux disease without esophagitis: K21.9

## 2016-11-26 HISTORY — DX: Personal history of other diseases of the digestive system: Z87.19

## 2016-11-26 HISTORY — DX: Other complications of anesthesia, initial encounter: T88.59XA

## 2016-11-26 HISTORY — DX: Unspecified osteoarthritis, unspecified site: M19.90

## 2016-11-26 LAB — TSH: TSH: 4.817 u[IU]/mL — ABNORMAL HIGH (ref 0.350–4.500)

## 2016-11-26 MED ORDER — DILTIAZEM HCL 100 MG IV SOLR
5.0000 mg/h | INTRAVENOUS | Status: DC
  Administered 2016-11-26 – 2016-11-28 (×4): 5 mg/h via INTRAVENOUS
  Filled 2016-11-26 (×4): qty 100

## 2016-11-26 MED ORDER — SIMVASTATIN 20 MG PO TABS
20.0000 mg | ORAL_TABLET | Freq: Every day | ORAL | Status: DC
  Administered 2016-11-26 – 2016-11-30 (×5): 20 mg via ORAL
  Filled 2016-11-26 (×5): qty 1

## 2016-11-26 MED ORDER — ENALAPRIL MALEATE 10 MG PO TABS
20.0000 mg | ORAL_TABLET | Freq: Every day | ORAL | Status: DC
  Administered 2016-11-27 – 2016-12-01 (×5): 20 mg via ORAL
  Filled 2016-11-26 (×5): qty 2

## 2016-11-26 MED ORDER — PANTOPRAZOLE SODIUM 40 MG PO TBEC
40.0000 mg | DELAYED_RELEASE_TABLET | Freq: Every day | ORAL | Status: DC
  Administered 2016-11-26 – 2016-12-01 (×6): 40 mg via ORAL
  Filled 2016-11-26 (×6): qty 1

## 2016-11-26 MED ORDER — DILTIAZEM LOAD VIA INFUSION
10.0000 mg | Freq: Once | INTRAVENOUS | Status: AC
  Administered 2016-11-26: 10 mg via INTRAVENOUS
  Filled 2016-11-26: qty 10

## 2016-11-26 MED ORDER — METOPROLOL SUCCINATE ER 50 MG PO TB24
75.0000 mg | ORAL_TABLET | Freq: Every day | ORAL | Status: DC
  Administered 2016-11-27 – 2016-11-28 (×2): 75 mg via ORAL
  Filled 2016-11-26 (×3): qty 1

## 2016-11-26 MED ORDER — DILTIAZEM HCL 30 MG PO TABS
30.0000 mg | ORAL_TABLET | Freq: Four times a day (QID) | ORAL | Status: DC
  Filled 2016-11-26: qty 1

## 2016-11-26 MED ORDER — POTASSIUM CHLORIDE ER 10 MEQ PO TBCR
10.0000 meq | EXTENDED_RELEASE_TABLET | Freq: Every day | ORAL | Status: DC
  Administered 2016-11-27: 10 meq via ORAL
  Filled 2016-11-26 (×2): qty 1

## 2016-11-26 MED ORDER — FUROSEMIDE 10 MG/ML IJ SOLN
40.0000 mg | Freq: Every day | INTRAMUSCULAR | Status: DC
  Administered 2016-11-26 – 2016-11-28 (×3): 40 mg via INTRAVENOUS
  Filled 2016-11-26 (×4): qty 4

## 2016-11-26 MED ORDER — APIXABAN 5 MG PO TABS
5.0000 mg | ORAL_TABLET | Freq: Two times a day (BID) | ORAL | Status: DC
  Administered 2016-11-26 – 2016-12-01 (×10): 5 mg via ORAL
  Filled 2016-11-26 (×10): qty 1

## 2016-11-27 ENCOUNTER — Other Ambulatory Visit: Payer: Self-pay

## 2016-11-27 ENCOUNTER — Inpatient Hospital Stay (HOSPITAL_COMMUNITY): Payer: Commercial Managed Care - HMO

## 2016-11-27 DIAGNOSIS — I10 Essential (primary) hypertension: Secondary | ICD-10-CM

## 2016-11-27 DIAGNOSIS — I5043 Acute on chronic combined systolic (congestive) and diastolic (congestive) heart failure: Secondary | ICD-10-CM

## 2016-11-27 DIAGNOSIS — I4891 Unspecified atrial fibrillation: Secondary | ICD-10-CM

## 2016-11-27 DIAGNOSIS — I481 Persistent atrial fibrillation: Principal | ICD-10-CM

## 2016-11-27 LAB — CBC
HCT: 40.5 % (ref 36.0–46.0)
Hemoglobin: 13.3 g/dL (ref 12.0–15.0)
MCH: 27 pg (ref 26.0–34.0)
MCHC: 32.8 g/dL (ref 30.0–36.0)
MCV: 82.3 fL (ref 78.0–100.0)
PLATELETS: 212 10*3/uL (ref 150–400)
RBC: 4.92 MIL/uL (ref 3.87–5.11)
RDW: 15.6 % — AB (ref 11.5–15.5)
WBC: 9.5 10*3/uL (ref 4.0–10.5)

## 2016-11-27 LAB — BASIC METABOLIC PANEL
Anion gap: 12 (ref 5–15)
BUN: 19 mg/dL (ref 6–20)
CALCIUM: 9 mg/dL (ref 8.9–10.3)
CHLORIDE: 104 mmol/L (ref 101–111)
CO2: 26 mmol/L (ref 22–32)
CREATININE: 1.1 mg/dL — AB (ref 0.44–1.00)
GFR calc Af Amer: 55 mL/min — ABNORMAL LOW (ref >60)
GFR calc non Af Amer: 48 mL/min — ABNORMAL LOW (ref >60)
Glucose, Bld: 98 mg/dL (ref 65–99)
Potassium: 3.3 mmol/L — ABNORMAL LOW (ref 3.5–5.1)
SODIUM: 142 mmol/L (ref 135–145)

## 2016-11-27 LAB — ECHOCARDIOGRAM COMPLETE
HEIGHTINCHES: 63 in
WEIGHTICAEL: 2787.2 [oz_av]

## 2016-11-27 LAB — MAGNESIUM: MAGNESIUM: 1.2 mg/dL — AB (ref 1.7–2.4)

## 2016-11-27 MED ORDER — ACETAMINOPHEN 325 MG PO TABS
650.0000 mg | ORAL_TABLET | Freq: Four times a day (QID) | ORAL | Status: DC | PRN
  Administered 2016-11-27: 650 mg via ORAL
  Filled 2016-11-27: qty 2

## 2016-11-27 MED ORDER — POTASSIUM CHLORIDE CRYS ER 20 MEQ PO TBCR
40.0000 meq | EXTENDED_RELEASE_TABLET | Freq: Once | ORAL | Status: AC
Start: 2016-11-28 — End: 2016-11-28
  Administered 2016-11-28: 40 meq via ORAL
  Filled 2016-11-27: qty 2

## 2016-11-27 MED ORDER — POTASSIUM CHLORIDE CRYS ER 20 MEQ PO TBCR
40.0000 meq | EXTENDED_RELEASE_TABLET | Freq: Once | ORAL | Status: AC
  Administered 2016-11-27: 40 meq via ORAL
  Filled 2016-11-27: qty 2

## 2016-11-27 NOTE — Progress Notes (Signed)
  Echocardiogram 2D Echocardiogram has been performed.  Janalyn HarderWest, Sheritta Deeg R 11/27/2016, 1:34 PM

## 2016-11-27 NOTE — H&P (Signed)
Lewayne BuntingBrian S Crenshaw, MD Physician Signed Cardiology Progress Notes Encounter Date: 11/26/2016 2:00 PM  Related encounter: [facility]       [] Hide copied text      HPI: FU atrial fibrillation.  Patient with new diagnosis of atrial fibrillation in June 2017. Placed on apixaban and metoprolol. Stress echocardiogram 2011 showed poor exercise tolerance but no ischemia. Echocardiogram June 2012 showed normal LV function and mild left atrial enlargement. Laboratories June 2017 showed creatinine 0.99 and TSH 2.49. Hemoglobin 13.2. Holter 9/17 showed afib with elevated rate; toprol increased. Since last seen, patient has had worsening dyspnea on exertion. She now gets dyspneic walking in the house. She has orthopnea and pedal edema. No chest pain, palpitations, syncope or bleeding.        Current Outpatient Prescriptions  Medication Sig Dispense Refill  . ELIQUIS 5 MG TABS tablet TAKE 1 TABLET(5 MG) BY MOUTH TWICE DAILY 60 tablet 1  . enalapril (VASOTEC) 20 MG tablet TAKE 1 TABLET EVERY DAY 90 tablet 1  . furosemide (LASIX) 20 MG tablet Take 1 tablet (20 mg total) by mouth daily. (Patient taking differently: Take 20 mg by mouth as directed. ) 30 tablet 1  . metoprolol succinate (TOPROL-XL) 25 MG 24 hr tablet Take 3 tablets (75 mg total) by mouth daily. Take with or immediately following a meal. 270 tablet 3  . omeprazole (PRILOSEC) 20 MG capsule Take 1 capsule (20 mg total) by mouth daily. 90 capsule 3  . potassium chloride (K-DUR) 10 MEQ tablet Take 1 tablet (10 mEq total) by mouth daily. (Patient taking differently: Take 10 mEq by mouth as directed. ) 90 tablet 2  . simvastatin (ZOCOR) 20 MG tablet TAKE 1 TABLET (20 MG TOTAL) BY MOUTH EVERY EVENING. 90 tablet 4   No current facility-administered medications for this visit.          Past Medical History:  Diagnosis Date  . Aortic atherosclerosis (HCC)   . Asthma   . Chronic atrial  fibrillation (HCC)   . CKD (chronic kidney disease), stage III   . Hyperlipidemia   . Hypertension   . Hypoglycemia          Past Surgical History:  Procedure Laterality Date  . CHOLECYSTECTOMY    . TONSILLECTOMY      Social History        Social History  . Marital status: Divorced    Spouse name: N/A  . Number of children: 3  . Years of education: N/A      Occupational History  . Not on file.         Social History Main Topics  . Smoking status: Never Smoker  . Smokeless tobacco: Never Used  . Alcohol use Yes     Comment: Rare  . Drug use: No  . Sexual activity: No       Other Topics Concern  . Not on file      Social History Narrative  . No narrative on file          Family History  Problem Relation Age of Onset  . CAD Mother     HEART ATTACK  . Colon cancer Father     KIDNEY FAILURE/COLON CANCER  . Atrial fibrillation Brother   . Heart failure Maternal Grandmother   . Kidney failure Maternal Grandfather   . Heart attack Paternal Grandmother   . Healthy Brother     ROS: no fevers or chills, productive cough,  hemoptysis, dysphasia, odynophagia, melena, hematochezia, dysuria, hematuria, rash, seizure activity, claudication. Remaining systems are negative.  Physical Exam: Well-developed well-nourished in no acute distress.  Skin is warm and dry.  HEENT is normal. Normal eyelids. Neck is supple. No thyromegaly. Normal carotid upstroke with no bruits. Chest is clear to auscultation with normal expansion.  Cardiovascular exam is tachycardic and irregular, no murmurs rubs or gallops.  Abdominal exam nontender or distended. No masses palpated.No hepatosplenomegaly. 2+ femoral pulses.   Extremities show 1+ edema. neuro grossly intact  ECG-Atrial fibrillation at a rate of 131. Right axis deviation. Nonspecific ST changes.  A/P  1 persistent atrial fibrillation- continue apixiban. Patient remains in  persistent atrial fibrillation. Her rate is elevated and she is extremely symptomatic with increasing dyspnea on exertion, orthopnea and pedal edema. She is having difficulty sleeping at night. I think she needs to be admitted and rate controlled. We will continue Toprol. Add Cardizem. Hopefully controlling heart rate will improve symptoms. If not we could consider trying cardioversion although she has been in atrial fibrillation for a prolonged period of time. She has not missed any doses of apixaban. Repeat echocardiogram. Check TSH.   2 acute diastolic congestive heart failure-patient is volume overloaded on examination. Add Lasix 40 mg IV daily and follow renal function. This is likely related to atrial fibrillation with uncontrolled heart rate. However she may have also developed a tachycardia mediated cardiomyopathy. Check echocardiogram for LV function. If LV function reduced she may need ischemia evaluation.  3 hypertension-blood pressure is elevated. We will add Cardizem to help both with blood pressure and rate control. Diuresis should also help.   4 hyperlipidemia-continue statiin.   Olga MillersBrian Crenshaw, MD      Pt admitted from office12/6/17. H and P for that date Olga MillersBrian Crenshaw, MD

## 2016-11-27 NOTE — Progress Notes (Addendum)
    Patient Name: Rebekah Harrell Date of Encounter: 11/27/2016  Active Problems:   Atrial fibrillation (HCC)   Length of Stay: 1  SUBJECTIVE  The patient states that she feels better but still SOB.   CURRENT MEDS . apixaban  5 mg Oral BID  . enalapril  20 mg Oral Daily  . furosemide  40 mg Intravenous Daily  . metoprolol succinate  75 mg Oral Daily  . pantoprazole  40 mg Oral Daily  . potassium chloride  10 mEq Oral Daily  . simvastatin  20 mg Oral q1800   OBJECTIVE  Vitals:   11/26/16 2300 11/27/16 0200 11/27/16 0300 11/27/16 1205  BP: 136/87 (!) 145/76 137/84 100/80  Pulse: 100 86 64 88  Resp:  18 20 18   Temp:   98.1 F (36.7 C) 97.1 F (36.2 C)  TempSrc:   Oral Oral  SpO2: 96% 94% 93% 95%  Weight:      Height:        Intake/Output Summary (Last 24 hours) at 11/27/16 1538 Last data filed at 11/27/16 1300  Gross per 24 hour  Intake            603.5 ml  Output             3575 ml  Net          -2971.5 ml   Filed Weights   11/26/16 1946  Weight: 174 lb 3.2 oz (79 kg)    PHYSICAL EXAM  General: Pleasant, NAD. Neuro: Alert and oriented X 3. Moves all extremities spontaneously. Psych: Normal affect. HEENT:  Normal  Neck: Supple without bruits or JVD. Lungs:  Resp regular and unlabored, crackles B/L  Heart: iRRR no s3, s4, or murmurs. Abdomen: Soft, non-tender, non-distended, BS + x 4.  Extremities: No clubbing, cyanosis or edema. DP/PT/Radials 2+ and equal bilaterally.  Accessory Clinical Findings  CBC  Recent Labs  11/27/16 0226  WBC 9.5  HGB 13.3  HCT 40.5  MCV 82.3  PLT 212   Basic Metabolic Panel  Recent Labs  11/27/16 0226  NA 142  K 3.3*  CL 104  CO2 26  GLUCOSE 98  BUN 19  CREATININE 1.10*  CALCIUM 9.0    Recent Labs  11/26/16 2022  TSH 4.817*    Radiology/Studies  No results found.  TELE: a-fib  140--> 84 BPM    ASSESSMENT AND PLAN  1. A-fib with RVR persistent - not responding to metoprolol, now rate  controlled on cardizem, her LVEF 35-40% with diffuse hypokinesis, previous LVEF 55-60% in 2012.  This is most probably tachycardia induced, we will focus on rhythm control. Discussed with Dr Johney FrameAllred, Flecainide not a good option given low EF, we will try Tikosyn load. She is hypokalemic, we will replace with goal K > 4, and check Mg and replace if Mg < 2.  EP service will take over in the morning for Tikosyn load.  CHADS-VASc  5, she has been on Eliquis since May without interruption.   2. Acute on chronic systolic CHF - on metoprolol, enalapril, continue iv lasix 40 mg daily, great diuresis overnight, she was diuretics naive.  Cardiomyopathy most probably tachycardia induced.   Signed, Tobias AlexanderKatarina Nayelie Gionfriddo MD, [facility] 11/27/2016   CHA2DS2/VAS Stroke Risk Points      5 >= 2 Points: High Risk

## 2016-11-28 ENCOUNTER — Other Ambulatory Visit: Payer: Self-pay

## 2016-11-28 DIAGNOSIS — I5041 Acute combined systolic (congestive) and diastolic (congestive) heart failure: Secondary | ICD-10-CM

## 2016-11-28 LAB — MAGNESIUM: MAGNESIUM: 2.8 mg/dL — AB (ref 1.7–2.4)

## 2016-11-28 LAB — BASIC METABOLIC PANEL
ANION GAP: 11 (ref 5–15)
BUN: 15 mg/dL (ref 6–20)
CHLORIDE: 104 mmol/L (ref 101–111)
CO2: 26 mmol/L (ref 22–32)
Calcium: 8.2 mg/dL — ABNORMAL LOW (ref 8.9–10.3)
Creatinine, Ser: 1.14 mg/dL — ABNORMAL HIGH (ref 0.44–1.00)
GFR calc non Af Amer: 46 mL/min — ABNORMAL LOW (ref >60)
GFR, EST AFRICAN AMERICAN: 53 mL/min — AB (ref >60)
Glucose, Bld: 84 mg/dL (ref 65–99)
POTASSIUM: 3.7 mmol/L (ref 3.5–5.1)
SODIUM: 141 mmol/L (ref 135–145)

## 2016-11-28 LAB — POTASSIUM: POTASSIUM: 3.9 mmol/L (ref 3.5–5.1)

## 2016-11-28 MED ORDER — SODIUM CHLORIDE 0.9 % IV SOLN: 250.0000 mL | INTRAVENOUS | Status: DC

## 2016-11-28 MED ORDER — SODIUM CHLORIDE 0.9% FLUSH
3.0000 mL | Freq: Two times a day (BID) | INTRAVENOUS | Status: DC
  Administered 2016-11-28 – 2016-11-30 (×5): 3 mL via INTRAVENOUS

## 2016-11-28 MED ORDER — SOTALOL HCL 80 MG PO TABS
120.0000 mg | ORAL_TABLET | Freq: Two times a day (BID) | ORAL | Status: DC
  Administered 2016-11-28 – 2016-11-29 (×3): 120 mg via ORAL
  Filled 2016-11-28 (×3): qty 2

## 2016-11-28 MED ORDER — MAGNESIUM SULFATE 4 GM/100ML IV SOLN
4.0000 g | Freq: Once | INTRAVENOUS | Status: AC
  Administered 2016-11-28: 4 g via INTRAVENOUS
  Filled 2016-11-28: qty 100

## 2016-11-28 MED ORDER — POTASSIUM CHLORIDE CRYS ER 10 MEQ PO TBCR
30.0000 meq | EXTENDED_RELEASE_TABLET | Freq: Once | ORAL | Status: AC
  Administered 2016-11-28: 30 meq via ORAL
  Filled 2016-11-28: qty 1

## 2016-11-28 MED ORDER — SODIUM CHLORIDE 0.9% FLUSH: 3.0000 mL | INTRAVENOUS | Status: DC | PRN

## 2016-11-28 NOTE — Progress Notes (Signed)
Insurance check completed for Sotalol S/W Rebekah Harrell @ [employer] [facility] # [phone]   SOTALOL 80 MG TABLET BID   COVER- YES  CO-PAY- $ 8.80  TIER- 2 DRUG  PRIOR APPROVAL- NO  PHARMACY : [facility]   MAIL-ORDER FOR 90 DAY SUPPLY : ZERO DOLLARS

## 2016-11-28 NOTE — Consult Note (Signed)
ELECTROPHYSIOLOGY CONSULT NOTE    Patient ID: Rebekah Harrell MRN: [id number], DOB/AGE: 1941-09-27 75 y.o.  Admit date: 11/26/2016 Date of Consult: 11/28/2016   Primary Physician: Nani GasserMETHENEY,[name], MD Primary Cardiologist: Dr. Jens Somrenshaw Requesting MD: Dr. Delton SeeNelson  Reason for Consultation: AFib, rhythm control options/management  HPI: Rebekah Harrell is a 75 y.o. female with PMHx of PAFib, asthma, CRI (stage III), HTN, HLD, no known hx of CAD referred to the hospital from the office by Dr. Jens Somrenshaw given RVR with her AF and increasing symptoms.  The patient was started on dilt gtt for rate control and IV lasix for fluid OL.  She is feeling well this morning.  She had noted increased swelling to her LE and reports increasing exertional intolerances and symptoms of orthopnea and PND as well.  No CP, no near syncope or syncope.  Initial presentatino with her AF was with CHF, she reports in the last 6 months since she has been in AF finding that she does not have as much energy and her exertional capacity is significantly lower then her baseline.  LABS: K+ 3.3 >> 3.7 BUN/Creat 15/1.14 Mag 1.2 H/H 13/40 WBC 9.5 Plts 212 TSH 4.817   AF Hx: Diagnosed June 2017, rate control and a/c strategy No hx of AAD   Past Medical History:  Diagnosis Date  . Aortic atherosclerosis (HCC)   . Arthritis    "lower back; knees" (11/26/2016)  . Asthma   . Chronic atrial fibrillation (HCC)   . CKD (chronic kidney disease), stage III    patient unaware of this on 11/26/2016  . Complication of anesthesia    "had trouble waking me up in the late 1990's after gallbladder OR"  . GERD (gastroesophageal reflux disease)   . Heart murmur    "dx'd when I was a little kid"  . History of hiatal hernia   . Hyperlipidemia   . Hypertension   . Hypoglycemia      Surgical History:  Past Surgical History:  Procedure Laterality Date  . CHOLECYSTECTOMY OPEN  1990s  . TONSILLECTOMY    . TUBAL LIGATION        Prescriptions Prior to Admission  Medication Sig Dispense Refill Last Dose  . albuterol (PROVENTIL HFA;VENTOLIN HFA) 108 (90 Base) MCG/ACT inhaler Inhale 2 puffs into the lungs every 6 (six) hours as needed for wheezing or shortness of breath.   Past Week at Unknown time  . CALCIUM PO Take 1 tablet by mouth at bedtime.   Past Week at Unknown time  . ELIQUIS 5 MG TABS tablet TAKE 1 TABLET(5 MG) BY MOUTH TWICE DAILY 60 tablet 1 11/26/2016 at 1300  . enalapril (VASOTEC) 20 MG tablet TAKE 1 TABLET EVERY DAY (Patient taking differently: TAKE 20 MG BY MOUTH AT NIGHT) 90 tablet 1 Past Week at Unknown time  . furosemide (LASIX) 20 MG tablet Take 1 tablet (20 mg total) by mouth daily. (Patient taking differently: Take 20 mg by mouth daily as needed for fluid. ) 30 tablet 21 June 2016 at Unknown  . metoprolol succinate (TOPROL-XL) 25 MG 24 hr tablet Take 3 tablets (75 mg total) by mouth daily. Take with or immediately following a meal. 270 tablet 3 11/26/2016 at 1300  . Multiple Vitamins-Minerals (MULTIVITAMIN PO) Take 1 tablet by mouth at bedtime.   Past Week at Unknown time  . omeprazole (PRILOSEC) 20 MG capsule Take 1 capsule (20 mg total) by mouth daily. (Patient taking differently: Take 20 mg by mouth at bedtime. )  90 capsule 3 Past Week at Unknown time  . potassium chloride (K-DUR) 10 MEQ tablet Take 1 tablet (10 mEq total) by mouth daily. (Patient taking differently: Take 10 mEq by mouth daily as needed (with lasix). ) 90 tablet 22 June 2016 at Unknown  . POTASSIUM GLUCONATE PO Take 1 tablet by mouth at bedtime.   Past Week at Unknown time  . simvastatin (ZOCOR) 20 MG tablet TAKE 1 TABLET (20 MG TOTAL) BY MOUTH EVERY EVENING. 90 tablet 4 Past Week at Unknown time    Inpatient Medications:  . apixaban  5 mg Oral BID  . enalapril  20 mg Oral Daily  . furosemide  40 mg Intravenous Daily  . magnesium sulfate 1 - 4 g bolus IVPB  4 g Intravenous Once  . metoprolol succinate  75 mg Oral Daily  .  pantoprazole  40 mg Oral Daily  . simvastatin  20 mg Oral q1800    Allergies:  Allergies  Allergen Reactions  . Tramadol Other (See Comments)    Night sweats, bad dreams  . Celecoxib Other (See Comments)    REACTION: hair loss  . Cetirizine Hcl Other (See Comments)    REACTION: fatigue  . Penicillins Other (See Comments)    Unknown  . Sulfonamide Derivatives Rash    Social History   Social History  . Marital status: Divorced    Spouse name: N/A  . Number of children: 3  . Years of education: N/A   Occupational History  . Not on file.   Social History Main Topics  . Smoking status: Never Smoker  . Smokeless tobacco: Never Used  . Alcohol use Yes     Comment: 11/26/2016 "might have a drink on the holidays"  . Drug use: No  . Sexual activity: No   Other Topics Concern  . Not on file   Social History Narrative  . No narrative on file     Family History  Problem Relation Age of Onset  . CAD Mother     HEART ATTACK  . Colon cancer Father     KIDNEY FAILURE/COLON CANCER  . Atrial fibrillation Brother   . Heart failure Maternal Grandmother   . Kidney failure Maternal Grandfather   . Heart attack Paternal Grandmother   . Healthy Brother      Review of Systems: All other systems reviewed and are otherwise negative except as noted above.  Physical Exam: Vitals:   11/27/16 0300 11/27/16 1205 11/27/16 2000 11/28/16 0508  BP: 137/84 100/80 122/72 118/82  Pulse: 64 88 80 70  Resp: 20 18 14 16   Temp: 98.1 F (36.7 C) 97.1 F (36.2 C) 98.1 F (36.7 C) 97.8 F (36.6 C)  TempSrc: Oral Oral Oral Oral  SpO2: 93% 95% 94% 93%  Weight:      Height:        GEN- The patient is well appearing, alert and oriented x 3 today.   HEENT: normocephalic, atraumatic; sclera clear, conjunctiva pink; hearing intact; oropharynx clear; neck supple, no JVP Lymph- no cervical lymphadenopathy Lungs-  Clear to ausculation bilaterally, normal work of breathing.  No wheezes, rales,  rhonchi Heart- IRRR, no murmurs, rubs or gallops, PMI not laterally displaced GI- soft, non-tender, non-distended Extremities- no clubbing, cyanosis, trace edema MS- no significant deformity or atrophy Skin- warm and dry, no rash or lesion Psych- euthymic mood, full affect Neuro- no gross deficits observed  Labs:   Lab Results  Component Value Date   WBC 9.5 11/27/2016  HGB 13.3 11/27/2016   HCT 40.5 11/27/2016   MCV 82.3 11/27/2016   PLT 212 11/27/2016    Recent Labs Lab 11/28/16 0310  NA 141  K 3.7  CL 104  CO2 26  BUN 15  CREATININE 1.14*  CALCIUM 8.2*  GLUCOSE 84      Radiology/Studies: No results found.    Cardiac data:  EKG: AFib, 89bpm, ST/T changes nonspecific, QTc difficult given AF 2012 last EKG in SR noted QTc TELEMETRY: AFib, CVR at this time  11/27/16: TTE Study Conclusions - Left ventricle: The cavity size was normal. There was moderate   concentric hypertrophy. Systolic function was normal. The   estimated ejection fraction was in the range of 50% to 55%. Wall   motion was normal; there were no regional wall motion   abnormalities. The study was not technically sufficient to allow   evaluation of LV diastolic dysfunction due to atrial   fibrillation. Doppler parameters are consistent with high   ventricular filling pressure. - Mitral valve: Calcified annulus. There was mild regurgitation. - Left atrium: The atrium was mildly dilated. 44mm - Right ventricle: Systolic function was mildly reduced. - Right atrium: The atrium was mildly dilated. - Atrial septum: There was increased thickness of the septum,   consistent with lipomatous hypertrophy. - Pulmonic valve: There was trivial regurgitation.  No significant VHD described  Dr. Jens Som notes: Stress echocardiogram 2011 showed poor exercise tolerance but no ischemia.   Assessment and Plan:   1. Persistent AFib     CHA2DS2Vasc is at least 4, on Eliquis Echo images are reviewed by  dr. Delton See, feels EF is more around/at best 45%, given this will not use Flecainide     Dr. Johney Frame discussed AAD options as well as AF ablation with the patient.       We will correct her electrolytes, plan for Sotalol to start tonight and DCCV on Monday if not in SR by then.     The patient is agreeable to stay and comfortable with the POC.  2. CHF     Felt secondary to AF     Fluid neg -     Follow lytes, renal function  3. HTN     stable  4. Hypomagnisemia     Replacement ordered  Signed, Francis Dowse, PA-C 11/28/2016 9:42 AM   I have seen, examined the patient, and reviewed the above assessment and plan.  On exam, iRRR.  Changes to above are made where necessary.  AADs discussed with the patient.  She would prefer sotalol.  Will replete electrolyte and initiate sotalol.  Follow over the weekend in the hospital and plan cardioversion if not in sinus rhythm on Monday.  Co Sign: Hillis Range, MD 11/28/2016

## 2016-11-28 NOTE — Consult Note (Signed)
[facility] CM Primary Care Navigator  11/28/2016  Rebekah Harrell [birth year] [id number]  Met with patient at the bedside to identify possible discharge needs. Patient reports had worsening dyspnea on exertion, now getting short of breath walking in the house and "felt like drowning" that led to this admission.  Patient endorses Dr. Beatrice Lecher with [facility] as her primary care provider.    Patient shared using [facility] and [facility]) to obtain medications. Patient mentioned having affordability issues with Eliquis and Albuterol. Inpatient CM made aware of patient's voiced concern regarding medications. Dr. Rayann Heman also assisting in finding alternative medication to Tikosyn that patient can afford.  Patient states managing her medications at home using "pill box" system.   Patient lives alone, able to drive and independent with self care prior to admission. She shared that her son [name] can provide transportation to doctors' appointments when needed.  Son and daughter-in law [name] will be able to assist with care needs if needed per patient.   Patient voiced understanding to call primary care provider's office when returns home, for a post discharge follow-up appointment within a week or sooner if needs arise. Patient letter provided as a reminder.  Patient expressed lack of knowledge on HF and AFib. She verbally agreed for a referral to [facility] care management for transition of care (telephonic), disease management/ education regarding HF and Afib as well as medication assistance and affordability. She opts to be contacted telephonically rather than a visit.  Will refer patient to [place] care management and [facility] pharmacy to follow-up needs after discharge.  For additional questions please contact:  Edwena Felty A. Kamika Goodloe, BSN, RN-BC [facility] PRIMARY CARE Navigator Cell: [phone]

## 2016-11-28 NOTE — Care Management Note (Signed)
Case Management Note Donn PieriniKristi Kieren Ricci RN, BSN Unit 2W-Case Manager [phone]  Patient Details  Name: Rebekah MylarMarcia L Harrell MRN: [id number] Date of Birth: Sep 09, 1941  Subjective/Objective:  Pt admitted with afib                  Action/Plan: PTA pt lived at home- plan for sotalol- drug is on [employer] $[facility] list- per insurance check- S/W SEBASTIAN @ [employer] RX # [phone]   SOTALOL 80 MG TABLET BID   COVER- YES  CO-PAY- $ 8.80  TIER- 2 DRUG  PRIOR APPROVAL- NO  PHARMACY : [facility]   MAIL-ORDER FOR 90 DAY SUPPLY : ZERO DOLLARS   Anticipate return home.   Expected Discharge Date:                  Expected Discharge Plan:  Home/Self Care  In-House Referral:     Discharge planning Services  CM Consult, Medication Assistance  Post Acute Care Choice:    Choice offered to:     DME Arranged:    DME Agency:     HH Arranged:    HH Agency:     Status of Service:  In process, will continue to follow  If discussed at Long Length of Stay Meetings, dates discussed:    Additional Comments:  Darrold SpanWebster, Cydni Reddoch Hall, RN 11/28/2016, 3:53 PM

## 2016-11-29 DIAGNOSIS — Z7901 Long term (current) use of anticoagulants: Secondary | ICD-10-CM

## 2016-11-29 DIAGNOSIS — R9431 Abnormal electrocardiogram [ECG] [EKG]: Secondary | ICD-10-CM

## 2016-11-29 DIAGNOSIS — I5031 Acute diastolic (congestive) heart failure: Secondary | ICD-10-CM

## 2016-11-29 LAB — BASIC METABOLIC PANEL
ANION GAP: 9 (ref 5–15)
BUN: 16 mg/dL (ref 6–20)
CALCIUM: 8.6 mg/dL — AB (ref 8.9–10.3)
CO2: 28 mmol/L (ref 22–32)
CREATININE: 1.11 mg/dL — AB (ref 0.44–1.00)
Chloride: 104 mmol/L (ref 101–111)
GFR calc Af Amer: 55 mL/min — ABNORMAL LOW (ref >60)
GFR, EST NON AFRICAN AMERICAN: 47 mL/min — AB (ref >60)
GLUCOSE: 103 mg/dL — AB (ref 65–99)
Potassium: 4.5 mmol/L (ref 3.5–5.1)
Sodium: 141 mmol/L (ref 135–145)

## 2016-11-29 LAB — MAGNESIUM: Magnesium: 2.2 mg/dL (ref 1.7–2.4)

## 2016-11-29 MED ORDER — METOPROLOL SUCCINATE ER 25 MG PO TB24
25.0000 mg | ORAL_TABLET | Freq: Every day | ORAL | Status: DC
  Administered 2016-11-29: 25 mg via ORAL

## 2016-11-29 MED ORDER — FUROSEMIDE 20 MG PO TABS
20.0000 mg | ORAL_TABLET | Freq: Every day | ORAL | Status: DC
  Administered 2016-11-29: 20 mg via ORAL
  Filled 2016-11-29: qty 1

## 2016-11-29 NOTE — Progress Notes (Signed)
She has converted to sinus rhythm. QTc appears stable at 480-490 msec.   Agree with reduced metoprolol.  Would not hold sotalol for bradycardias.  Please call me before holding sotalol.  I am optimistic that her EF will improve with sinus  Keep K >3.9 amd Mg >1.9 As long as stable, I will see again on Monday.  Please call with questions  Hillis RangeJames Dillian Feig MD, [facility] 11/29/2016 10:34 AM

## 2016-11-29 NOTE — Progress Notes (Signed)
RN called because pt HR 50. Has not had am rx.  Spoke w/ Dr Anne FuSkains, give Sotalol, decrease metoprolol XL to 25 mg qd. Orders written.  Theodore DemarkBarrett, Cristoval Teall, PA-C 11/29/2016 10:10 AM Beeper [phone]

## 2016-11-29 NOTE — Progress Notes (Signed)
Patient Name: Rebekah Harrell Calvo Date of Encounter: 11/29/2016  Primary Cardiologist: Dr. Jens Somrenshaw EP: [facility]  Hospital Problem List     Active Problems:   Atrial fibrillation [facility](HCC)     Subjective   Overnight, bradycardia, diltiazem IV stopped. Sotalol started. No chest pain, no shortness of breath. When she came in, she was very short of breath.  Inpatient Medications    Scheduled Meds: . apixaban  5 mg Oral BID  . enalapril  20 mg Oral Daily  . furosemide  40 mg Intravenous Daily  . metoprolol succinate  75 mg Oral Daily  . pantoprazole  40 mg Oral Daily  . simvastatin  20 mg Oral q1800  . sodium chloride flush  3 mL Intravenous Q12H  . sotalol  120 mg Oral Q12H   Continuous Infusions: . sodium chloride     PRN Meds: acetaminophen, sodium chloride flush   Vital Signs    Vitals:   11/28/16 1339 11/28/16 1900 11/29/16 0602 11/29/16 0853  BP: 106/73 117/77 129/63 (!) 121/57  Pulse: 78 76 (!) 59 (!) 58  Resp: 16 18 18 18   Temp: 97.8 F (36.6 C) 98.7 F (37.1 C) 98.4 F (36.9 C) 97.8 F (36.6 C)  TempSrc: Oral Oral Oral Oral  SpO2: 95% 95% 92% 94%  Weight:      Height:        Intake/Output Summary (Last 24 hours) at 11/29/16 0916 Last data filed at 11/29/16 0603  Gross per 24 hour  Intake              120 ml  Output             2401 ml  Net            -2281 ml   Filed Weights   11/26/16 1946  Weight: 174 lb 3.2 oz (79 kg)    Physical Exam    GEN: Well nourished, well developed, in no acute distress.  HEENT: Grossly normal.  Neck: Supple, no JVD, carotid bruits, or masses. Cardiac: RRR, no murmurs, rubs, or gallops. No clubbing, cyanosis, edema.  Radials/DP/PT 2+ and equal bilaterally. Occasional ectopy Respiratory:  Respirations regular and unlabored, clear to auscultation bilaterally. GI: Soft, nontender, nondistended, BS + x 4. MS: no deformity or atrophy. Skin: warm and dry, no rash. Neuro:  Strength and sensation are intact. Psych:  AAOx3.  Normal affect.  Labs    CBC  Recent Labs  11/27/16 0226  WBC 9.5  HGB 13.3  HCT 40.5  MCV 82.3  PLT 212   Basic Metabolic Panel  Recent Labs  11/28/16 0310 11/28/16 1410 11/29/16 0312  NA 141  --  141  K 3.7 3.9 4.5  CL 104  --  104  CO2 26  --  28  GLUCOSE 84  --  103*  BUN 15  --  16  CREATININE 1.14*  --  1.11*  CALCIUM 8.2*  --  8.6*  MG  --  2.8* 2.2     Recent Labs  11/26/16 2022  TSH 4.817*    Telemetry    Normal sinus rhythm-heart rate 60, converted from atrial fibrillation previously seen last evening. Heart rate dropped into 38 at 1 AM, diltiazem stropped. Occasional blocked PAC noted. No significant pauses. No ventricular tachycardia. - Personally Reviewed  ECG    EKG: AFib, 89bpm, ST/T changes nonspecific, QTc difficult given AF 2012 last EKG in SR noted QTc 422ms. EKG from 11/29/16 at 1:39 AM  shows sinus bradycardia, QTC 495 ms, prolonged QT- Personally Reviewed  Radiology    No results found.  Cardiac Studies   11/27/16: TTE Study Conclusions - Left ventricle: The cavity size was normal. There was moderate concentric hypertrophy. Systolic function was normal. The estimated ejection fraction was in the range of 50% to 55%. Wall motion was normal; there were no regional wall motion abnormalities. The study was not technically sufficient to allow evaluation of LV diastolic dysfunction due to atrial fibrillation. Doppler parameters are consistent with high ventricular filling pressure. - Mitral valve: Calcified annulus. There was mild regurgitation. - Left atrium: The atrium was mildly dilated. 44mm - Right ventricle: Systolic function was mildly reduced. - Right atrium: The atrium was mildly dilated. - Atrial septum: There was increased thickness of the septum, consistent with lipomatous hypertrophy. - Pulmonic valve: There was trivial regurgitation.  No significant VHD described  Dr. Jens Somrenshaw notes: Stress  echocardiogram 2011 showed poor exercise tolerance but no ischemia  Patient Profile     Rebekah Harrell Sawyers is a 75 y.o. female with PMHx of PAFib, asthma, CRI (stage III), HTN, HLD, no known hx of CAD referred to the hospital from the office by Dr. Jens Somrenshaw given RVR with her AF and increasing symptoms.  Assessment & Plan    Persistent atrial fibrillation  - Successful conversion with sotalol  - QTC 495, lengthened from previous 422. Watch for evidence of further QT prolongation as we continue medication. Creatinine normal.  - Occasional blocked PAC  - Check EKG 's, orders are written.  - Eliquis  - Stopped diltiazem IV  - continue with sotalol and metoprolol. If bradycardia occurs, consider decreasing metoprolol.  Essential hypertension  - Well controlled. Medications reviewed.  Hyperlipidemia  - Simvastatin 20 mg.  Presumed acute diastolic heart failure in the setting of atrial fibrillation  - Shortness of breath noted prior to admission.  - Diuresis with IV Lasix 40 mg once a day. -3 Harrell.  - Appears quite comfortable currently. I will change to Lasix 20 mg by mouth daily.  - This should improve as she has had restoration of sinus rhythm.  If she maintains sinus rhythm without any evidence of worsening QT, she may be able to be discharged tomorrow. Watch for signs of adverse arrhythmias.  Signed, Donato SchultzMark Skains, MD  11/29/2016, 9:16 AM

## 2016-11-29 NOTE — Progress Notes (Signed)
Telemetry called and stated pt's heart rate was in the 40s. Writer stopped Cardizem drip at 15ml/hr. Pt received Solatol 120 mg per order earlier this shift to see if heart rhythm would convert to NSR. Rhythm did not convert. HR dropped. Pt is asymptomatic and Dr. Tiburcio PeaHarris in cardiology made aware and stated it was fine to stop the cardizem drip. Pt made aware and she is resting comfortably.

## 2016-11-29 NOTE — Progress Notes (Signed)
Patient ambulated [id number] on room air in the hallway well tolerated will continue to monitor

## 2016-11-30 ENCOUNTER — Other Ambulatory Visit: Payer: Self-pay

## 2016-11-30 DIAGNOSIS — R001 Bradycardia, unspecified: Secondary | ICD-10-CM

## 2016-11-30 DIAGNOSIS — I5023 Acute on chronic systolic (congestive) heart failure: Secondary | ICD-10-CM

## 2016-11-30 LAB — BASIC METABOLIC PANEL WITH GFR
Anion gap: 6 (ref 5–15)
BUN: 20 mg/dL (ref 6–20)
CO2: 30 mmol/L (ref 22–32)
Calcium: 9 mg/dL (ref 8.9–10.3)
Chloride: 103 mmol/L (ref 101–111)
Creatinine, Ser: 1.22 mg/dL — ABNORMAL HIGH (ref 0.44–1.00)
GFR calc Af Amer: 49 mL/min — ABNORMAL LOW
GFR calc non Af Amer: 42 mL/min — ABNORMAL LOW
Glucose, Bld: 90 mg/dL (ref 65–99)
Potassium: 4.5 mmol/L (ref 3.5–5.1)
Sodium: 139 mmol/L (ref 135–145)

## 2016-11-30 LAB — MAGNESIUM: Magnesium: 2.1 mg/dL (ref 1.7–2.4)

## 2016-11-30 MED ORDER — FUROSEMIDE 40 MG PO TABS
40.0000 mg | ORAL_TABLET | Freq: Every day | ORAL | Status: DC
Start: 2016-11-30 — End: 2016-12-01
  Administered 2016-11-30 – 2016-12-01 (×2): 40 mg via ORAL
  Filled 2016-11-30 (×2): qty 1

## 2016-11-30 MED ORDER — SOTALOL HCL 80 MG PO TABS
80.0000 mg | ORAL_TABLET | Freq: Two times a day (BID) | ORAL | Status: DC
  Administered 2016-11-30 – 2016-12-01 (×3): 80 mg via ORAL
  Filled 2016-11-30 (×3): qty 1

## 2016-11-30 NOTE — Discharge Instructions (Signed)

## 2016-11-30 NOTE — Progress Notes (Signed)
   SUBJECTIVE: The patient is doing well today. Remains in sinus.  + SOB.   At this time, she denies chest pain, or any new concerns.  Rebekah Harrell. apixaban  5 mg Oral BID  . enalapril  20 mg Oral Daily  . furosemide  20 mg Oral Daily  . pantoprazole  40 mg Oral Daily  . simvastatin  20 mg Oral q1800  . sodium chloride flush  3 mL Intravenous Q12H  . sotalol  120 mg Oral Q12H   . sodium chloride      OBJECTIVE: Physical Exam: Vitals:   11/29/16 1024 11/29/16 1344 11/29/16 2023 11/30/16 0540  BP: 127/72 (!) 130/55 (!) 153/61 (!) 137/58  Pulse: 61 61 64 (!) 51  Resp:  16 18   Temp:  97.3 F (36.3 C) 97.9 F (36.6 C) 98.4 F (36.9 C)  TempSrc:  Oral Oral Other (Comment)  SpO2:  97% 98% 94%  Weight:      Height:        Intake/Output Summary (Last 24 hours) at 11/30/16 0842 Last data filed at 11/29/16 1700  Gross per 24 hour  Intake              480 ml  Output              518 ml  Net              -38 ml    Telemetry reveals sinus bradycardia, Qtc 540 msec, no ventricular ectopy  GEN- The patient is well appearing, alert and oriented x 3 today.   Head- normocephalic, atraumatic Eyes-  Sclera clear, conjunctiva pink Ears- hearing intact Oropharynx- clear Neck- supple, + JVD Lungs-  normal work of breathing Heart- bradycardic regular rhythm GI- soft, NT  Extremities- no clubbing, cyanosis, or edema Skin- no rash or lesion Psych- euthymic mood, full affect Neuro- strength and sensation are intact  LABS: Basic Metabolic Panel:  Recent Labs  16/09/9611/09/17 0312 11/30/16 0235  NA 141 139  K 4.5 4.5  CL 104 103  CO2 28 30  GLUCOSE 103* 90  BUN 16 20  CREATININE 1.11* 1.22*  CALCIUM 8.6* 9.0  MG 2.2 2.1   ASSESSMENT AND PLAN:  1. Persistent AFib     CHA2DS2Vasc is at least 4, on Eliquis Now maintaining sinus rhythm with sotalol.  QT is a little long.  Will reduce sotalol to 80mg  BID today.    2. Acute on chronic systolic dysfunction     likely tachy mediated.  Will  increase lasix to 40mg  daily today  3. HTN     stable  4. Hypomagnisemia     repleted  5. Sinus bradycardia     Asymptomatic     Will stop toprol for now  Anticipate possible discharge to home tomorrow if QT is stable Will need close follow-up in AF clinic  Hillis RangeJames Maeva Dant, MD 11/30/2016 8:42 AM

## 2016-12-01 ENCOUNTER — Ambulatory Visit: Payer: Commercial Managed Care - HMO | Admitting: Cardiology

## 2016-12-01 ENCOUNTER — Other Ambulatory Visit: Payer: Self-pay | Admitting: Nurse Practitioner

## 2016-12-01 ENCOUNTER — Encounter (HOSPITAL_COMMUNITY): Payer: Self-pay | Admitting: Certified Registered"

## 2016-12-01 ENCOUNTER — Encounter (HOSPITAL_COMMUNITY)
Admission: AD | Disposition: A | Discharge status: Home / Self Care | Payer: Self-pay | Source: Ambulatory Visit | Attending: Cardiology

## 2016-12-01 ENCOUNTER — Other Ambulatory Visit: Payer: Self-pay

## 2016-12-01 DIAGNOSIS — G4730 Sleep apnea, unspecified: Secondary | ICD-10-CM

## 2016-12-01 LAB — BASIC METABOLIC PANEL
ANION GAP: 11 (ref 5–15)
BUN: 20 mg/dL (ref 6–20)
CHLORIDE: 103 mmol/L (ref 101–111)
CO2: 26 mmol/L (ref 22–32)
CREATININE: 1.18 mg/dL — AB (ref 0.44–1.00)
Calcium: 9 mg/dL (ref 8.9–10.3)
GFR calc Af Amer: 51 mL/min — ABNORMAL LOW (ref >60)
GFR calc non Af Amer: 44 mL/min — ABNORMAL LOW (ref >60)
Glucose, Bld: 91 mg/dL (ref 65–99)
Potassium: 3.7 mmol/L (ref 3.5–5.1)
SODIUM: 140 mmol/L (ref 135–145)

## 2016-12-01 LAB — MAGNESIUM: MAGNESIUM: 1.9 mg/dL (ref 1.7–2.4)

## 2016-12-01 SURGERY — CARDIOVERSION
Anesthesia: Monitor Anesthesia Care

## 2016-12-01 MED ORDER — FUROSEMIDE 40 MG PO TABS: 40.0000 mg | ORAL_TABLET | Freq: Every day | ORAL | 2 refills | Status: DC

## 2016-12-01 MED ORDER — POTASSIUM CHLORIDE ER 20 MEQ PO TBCR: 20.0000 meq | EXTENDED_RELEASE_TABLET | Freq: Every day | ORAL | 1 refills | Status: DC

## 2016-12-01 MED ORDER — FUROSEMIDE 40 MG PO TABS: 40.0000 mg | ORAL_TABLET | Freq: Every day | ORAL | 1 refills | Status: DC

## 2016-12-01 MED ORDER — POTASSIUM CHLORIDE ER 20 MEQ PO TBCR: 20.0000 meq | EXTENDED_RELEASE_TABLET | Freq: Every day | ORAL | 2 refills | Status: DC

## 2016-12-01 MED ORDER — SOTALOL HCL 80 MG PO TABS: 80.0000 mg | ORAL_TABLET | Freq: Two times a day (BID) | ORAL | 2 refills | Status: DC

## 2016-12-01 MED ORDER — SOTALOL HCL 80 MG PO TABS: 80.0000 mg | ORAL_TABLET | Freq: Two times a day (BID) | ORAL | 1 refills | Status: DC

## 2016-12-01 NOTE — Care Management Note (Signed)
Case Management Note Donn PieriniKristi Muna Demers RN, BSN Unit 2W-Case Manager [phone]  Patient Details  Name: Rebekah MylarMarcia L Basurto MRN: [id number] Date of Birth: 11-14-1941  Subjective/Objective:  Pt admitted with afib                  Action/Plan: PTA pt lived at home- plan for sotalol- drug is on [employer] $4 list- per insurance check- S/W SEBAST[facility] @ [employer] [facility] # [phone]   SOTALOL 80 MG TABLET BID   COVER- YES  CO-PAY- $ 8.80  TIER- 2 DRUG  PRIOR APPROVAL- NO  PHARMACY : [facility]   MAIL-ORDER FOR 90 DAY SUPPLY : ZERO DOLLARS   Anticipate return home.   Expected Discharge Date:    12/01/16              Expected Discharge Plan:  Home/Self Care  In-House Referral:     Discharge planning Services  CM Consult, Medication Assistance  Post Acute Care Choice:    Choice offered to:     DME Arranged:    DME Agency:     HH Arranged:    HH Agency:     Status of Service:  Completed, signed off  If discussed at [employer] Length of Stay Meetings, dates discussed:    Discharge Disposition: home/self care   Additional Comments:  12/01/16- 1014- Rahmel Nedved RN, CM-  Pt for d/c today- no further CM needs noted-   Zenda AlpersWebster, Lenn SinkKristi Hall, RN 12/01/2016, 10:13 AM

## 2016-12-01 NOTE — Progress Notes (Signed)
Called MudloggerAmber Sieler, NP concerning patient activity at discharge.  Patient works two jobs.  Patient is aware that she will be following up with Afib clinic on Friday 12/05/16 and can address issues with home business.  Patient routinely lifts 30-40 lbs and carries up a couple of flight of stairs. Patient will take it easy until follow up appointment. Pt resting with call bell within reach.  Will continue to monitor. Thomas HoffBurton, Smantha Boakye McClintock, RN

## 2016-12-01 NOTE — Care Management Important Message (Signed)
Important Message  Patient Details  Name: Rebekah Harrell MRN: [id number] Date of Birth: 06/29/1941   Medicare Important Message Given:  Yes    Kyla BalzarineShealy, Lucero Ide Abena 12/01/2016, 9:56 AM

## 2016-12-01 NOTE — Progress Notes (Signed)
Pt/family given discharge instructions, medication lists, follow up appointments, and when to call the doctor.  Pt/family verbalizes understanding. Patient aware to take it easy until follow up appointment. Patient son came to follow patient home. Patient drove her own car to hospital and will take it home. Thomas HoffBurton, Forney Kleinpeter McClintock, RN

## 2016-12-01 NOTE — Discharge Summary (Signed)
ELECTROPHYSIOLOGY PROCEDURE DISCHARGE SUMMARY    Patient ID: Rebekah Harrell,  MRN: [id number], DOB/AGE: May 19, 1941 75 y.o.  Admit date: 11/26/2016 Discharge date: 12/01/2016  Primary Care Physician: Nani GasserMETHENEY,[name], MD Primary Cardiologist: Jens Somrenshaw  Primary Discharge Diagnosis:  Active Problems:   Atrial fibrillation (HCC)   Allergies  Allergen Reactions  . Tramadol Other (See Comments)    Night sweats, bad dreams  . Celecoxib Other (See Comments)    REACTION: hair loss  . Cetirizine Hcl Other (See Comments)    REACTION: fatigue  . Penicillins Other (See Comments)    Unknown  . Sulfonamide Derivatives Rash    Brief HPI/Hospital Course:  Rebekah Harrell is a 75 y.o. female with a past medical history significant for persistent atrial fibrillation, hypertension, CKD stage III, hyperlipidemia who was admitted from the office for recurrent symptomatic atrial fibrillation.  She was seen by Dr Johney FrameAllred and treatment options were reviewed. Sotalol was started with conversion to SR. Dose was decreased to 80mg  twice daily for prolonged QT. Because of bradycardia, Metoprolol was stopped.  She was monitored on telemetry which demonstrated sinus bradycardia with no arrhythmias. Creatinine was stable. She will be discharged on Lasix 40mg  daily until seen in AF cilnic on Friday - at that time, Lasix dose could potentially be decreased depending on fluid status and renal function.  She will also need outpatient sleep study. I have asked for this to be scheduled, but will need to be followed up on at post hospital appointment.   Physical Exam: Vitals:   11/30/16 0950 11/30/16 1332 11/30/16 2054 12/01/16 0409  BP: 133/63 (!) 145/65 (!) 154/66 (!) 151/71  Pulse:  (!) 51 (!) 57 (!) 51  Resp:  18 18 18   Temp:  97.5 F (36.4 C) 97.8 F (36.6 C) 98.3 F (36.8 C)  TempSrc:  Oral Oral Oral  SpO2:  98% 97% 94%  Weight:      Height:        GEN- The patient is well appearing, alert  and oriented x 3 today.   HEENT: normocephalic, atraumatic; sclera clear, conjunctiva pink; hearing intact; oropharynx clear; neck supple  Lungs- Clear to ausculation bilaterally, normal work of breathing.  No wheezes, rales, rhonchi Heart- Regular rate and rhythm  GI- soft, non-tender, non-distended, bowel sounds present  Extremities- no clubbing, cyanosis, or edema  MS- no significant deformity or atrophy Skin- warm and dry, no rash or lesion Psych- euthymic mood, full affect Neuro- strength and sensation are intact    Labs:   Lab Results  Component Value Date   WBC 9.5 11/27/2016   HGB 13.3 11/27/2016   HCT 40.5 11/27/2016   MCV 82.3 11/27/2016   PLT 212 11/27/2016     Recent Labs Lab 12/01/16 0344  NA 140  K 3.7  CL 103  CO2 26  BUN 20  CREATININE 1.18*  CALCIUM 9.0  GLUCOSE 91     Discharge Medications:  Current Discharge Medication List    START taking these medications   Details  sotalol (BETAPACE) 80 MG tablet Take 1 tablet (80 mg total) by mouth every 12 (twelve) hours. Qty: 60 tablet, Refills: 1      CONTINUE these medications which have CHANGED   Details  furosemide (LASIX) 40 MG tablet Take 1 tablet (40 mg total) by mouth daily. Qty: 30 tablet, Refills: 1    potassium chloride 20 MEQ TBCR Take 20 mEq by mouth daily. Qty: 30 tablet, Refills: 1  CONTINUE these medications which have NOT CHANGED   Details  albuterol (PROVENTIL HFA;VENTOLIN HFA) 108 (90 Base) MCG/ACT inhaler Inhale 2 puffs into the lungs every 6 (six) hours as needed for wheezing or shortness of breath.    CALCIUM PO Take 1 tablet by mouth at bedtime.    ELIQUIS 5 MG TABS tablet TAKE 1 TABLET(5 MG) BY MOUTH TWICE DAILY Qty: 60 tablet, Refills: 1   Associated Diagnoses: Chronic atrial fibrillation (HCC)    enalapril (VASOTEC) 20 MG tablet TAKE 1 TABLET EVERY DAY Qty: 90 tablet, Refills: 1    Multiple Vitamins-Minerals (MULTIVITAMIN PO) Take 1 tablet by mouth at bedtime.      omeprazole (PRILOSEC) 20 MG capsule Take 1 capsule (20 mg total) by mouth daily. Qty: 90 capsule, Refills: 3    POTASSIUM GLUCONATE PO Take 1 tablet by mouth at bedtime.    simvastatin (ZOCOR) 20 MG tablet TAKE 1 TABLET (20 MG TOTAL) BY MOUTH EVERY EVENING. Qty: 90 tablet, Refills: 4      STOP taking these medications     metoprolol succinate (TOPROL-XL) 25 MG 24 hr tablet         Disposition: Pt is being discharged home today in good condition.  Follow-up Information     [facility] Follow up on 12/05/2016.   Specialty:  Cardiology Why:  at 8:30AM Contact information: [address] [id number] [place] [phone]       Olga MillersBrian Crenshaw, MD Follow up on 01/01/2017.   Specialty:  Cardiology Why:  at [facility]  Contact information: [address] [place] [id number] [phone]           Duration of Discharge Encounter: Greater than 30 minutes including physician time.  Signed, Gypsy BalsamAmber Seiler, NP 12/01/2016 8:53 AM   I have seen, examined the patient, and reviewed the above assessment and plan.  On exam RRR.  Changes to above are made where necessary.  Will need close follow-up with AF clinic and Dr Jens Somrenshaw.  I will se when needed going forward.  Co Sign: Hillis RangeJames Searra Carnathan, MD 12/01/2016 12:44 PM

## 2016-12-02 ENCOUNTER — Other Ambulatory Visit: Payer: Self-pay | Admitting: Family Medicine

## 2016-12-02 DIAGNOSIS — I480 Paroxysmal atrial fibrillation: Secondary | ICD-10-CM

## 2016-12-04 ENCOUNTER — Other Ambulatory Visit: Payer: Self-pay | Admitting: Family Medicine

## 2016-12-05 ENCOUNTER — Other Ambulatory Visit (HOSPITAL_COMMUNITY): Payer: Self-pay | Admitting: *Deleted

## 2016-12-05 ENCOUNTER — Ambulatory Visit (HOSPITAL_COMMUNITY)
Admission: RE | Admit: 2016-12-05 | Discharge: 2016-12-05 | Disposition: A | Discharge status: Home / Self Care | Payer: Commercial Managed Care - HMO | Source: Ambulatory Visit | Attending: Nurse Practitioner | Admitting: Nurse Practitioner

## 2016-12-05 ENCOUNTER — Other Ambulatory Visit: Payer: Self-pay | Admitting: *Deleted

## 2016-12-05 ENCOUNTER — Telehealth (HOSPITAL_COMMUNITY): Payer: Self-pay | Admitting: *Deleted

## 2016-12-05 ENCOUNTER — Encounter (HOSPITAL_COMMUNITY): Payer: Self-pay | Admitting: Nurse Practitioner

## 2016-12-05 VITALS — BP 136/84 | HR 53 | Ht 63.0 in | Wt 165.8 lb

## 2016-12-05 DIAGNOSIS — N183 Chronic kidney disease, stage 3 (moderate): Secondary | ICD-10-CM | POA: Diagnosis not present

## 2016-12-05 DIAGNOSIS — Z880 Allergy status to penicillin: Secondary | ICD-10-CM | POA: Insufficient documentation

## 2016-12-05 DIAGNOSIS — Z7901 Long term (current) use of anticoagulants: Secondary | ICD-10-CM | POA: Insufficient documentation

## 2016-12-05 DIAGNOSIS — Z79899 Other long term (current) drug therapy: Secondary | ICD-10-CM | POA: Insufficient documentation

## 2016-12-05 DIAGNOSIS — G4733 Obstructive sleep apnea (adult) (pediatric): Secondary | ICD-10-CM

## 2016-12-05 DIAGNOSIS — E785 Hyperlipidemia, unspecified: Secondary | ICD-10-CM | POA: Diagnosis not present

## 2016-12-05 DIAGNOSIS — K219 Gastro-esophageal reflux disease without esophagitis: Secondary | ICD-10-CM | POA: Diagnosis not present

## 2016-12-05 DIAGNOSIS — I129 Hypertensive chronic kidney disease with stage 1 through stage 4 chronic kidney disease, or unspecified chronic kidney disease: Secondary | ICD-10-CM | POA: Diagnosis not present

## 2016-12-05 DIAGNOSIS — I481 Persistent atrial fibrillation: Secondary | ICD-10-CM

## 2016-12-05 DIAGNOSIS — I4819 Other persistent atrial fibrillation: Secondary | ICD-10-CM

## 2016-12-05 DIAGNOSIS — Z882 Allergy status to sulfonamides status: Secondary | ICD-10-CM | POA: Diagnosis not present

## 2016-12-05 LAB — BASIC METABOLIC PANEL
Anion gap: 9 (ref 5–15)
BUN: 16 mg/dL (ref 6–20)
CO2: 29 mmol/L (ref 22–32)
Calcium: 10 mg/dL (ref 8.9–10.3)
Chloride: 101 mmol/L (ref 101–111)
Creatinine, Ser: 1.08 mg/dL — ABNORMAL HIGH (ref 0.44–1.00)
GFR calc Af Amer: 57 mL/min — ABNORMAL LOW (ref >60)
GFR calc non Af Amer: 49 mL/min — ABNORMAL LOW (ref >60)
Glucose, Bld: 128 mg/dL — ABNORMAL HIGH (ref 65–99)
Potassium: 3.9 mmol/L (ref 3.5–5.1)
Sodium: 139 mmol/L (ref 135–145)

## 2016-12-05 LAB — MAGNESIUM: Magnesium: 1.7 mg/dL (ref 1.7–2.4)

## 2016-12-05 MED ORDER — FUROSEMIDE 40 MG PO TABS: 20.0000 mg | ORAL_TABLET | Freq: Every day | ORAL | 2 refills | Status: DC

## 2016-12-05 NOTE — Patient Instructions (Signed)
Your physician has recommended you make the following change in your medication:  1)Decrease lasix to 20mg  daily -- weigh yourself daily if you notice increase in symptoms or weight can increase back to 40mg  daily  Sleep study scheduler will be in touch with you schedule.

## 2016-12-05 NOTE — Progress Notes (Signed)
Primary Care Physician: Nani GasserMETHENEY,[name], MD [facility] Cardiologist: Dr. Vanna ScotlandAllred   Rebekah Harrell is a 75 y.o. female with a h/o  persistent atrial fibrillation, hypertension, CKD stage III, hyperlipidemia who was admitted from Dr. Ludwig Clarksrenshaw's office for recurrent symptomatic atrial fibrillation.  She was seen by Dr Johney FrameAllred and treatment options were reviewed. Sotalol was started with conversion to SR. Dose was decreased to 80mg  twice daily for prolonged QT. Because of bradycardia, Metoprolol was stopped.  She was monitored on telemetry which demonstrated sinus bradycardia with no arrhythmias. Creatinine was stable. Discharge instructions were for  Lasix 40mg  daily until seen today, at that time, Lasix dose could potentially be decreased depending on fluid status and renal function.  She will also need outpatient sleep study.  In the afib clinic,she is feeling much better. She has diuresed 13 lbs since staring lasix and feels that she is back to baseline weight. She has not noticed any further afib.Her exertional dyspnea is much improved. Qtc is stable on sotalol. Lives alone and does not know if truly snores but gets up to go to bathroom 3-5 times a night and is tired upon awakening. No alcohol, moderate caffeine, no tobacco.   Today, she denies symptoms of palpitations, chest pain, shortness of breath, orthopnea, PND, lower extremity edema, dizziness, presyncope, syncope, or neurologic sequela. The patient is tolerating medications without difficulties and is otherwise without complaint today.   Past Medical History:  Diagnosis Date  . Aortic atherosclerosis (HCC)   . Arthritis    "lower back; knees" (11/26/2016)  . Asthma   . Chronic atrial fibrillation (HCC)   . CKD (chronic kidney disease), stage III    patient unaware of this on 11/26/2016  . Complication of anesthesia    "had trouble waking me up in the late 1990's after gallbladder OR"  . GERD (gastroesophageal  reflux disease)   . Heart murmur    "dx'd when I was a little kid"  . History of hiatal hernia   . Hyperlipidemia   . Hypertension   . Hypoglycemia    Past Surgical History:  Procedure Laterality Date  . CHOLECYSTECTOMY OPEN  1990s  . TONSILLECTOMY    . TUBAL LIGATION      Current Outpatient Prescriptions  Medication Sig Dispense Refill  . albuterol (PROVENTIL HFA;VENTOLIN HFA) 108 (90 Base) MCG/ACT inhaler Inhale 2 puffs into the lungs every 6 (six) hours as needed for wheezing or shortness of breath.    Marland Kitchen. CALCIUM PO Take 1 tablet by mouth at bedtime.    Marland Kitchen. ELIQUIS 5 MG TABS tablet TAKE 1 TABLET(5 MG) BY MOUTH TWICE DAILY 60 tablet 1  . enalapril (VASOTEC) 20 MG tablet TAKE 1 TABLET EVERY DAY (Patient taking differently: TAKE 20 MG BY MOUTH AT NIGHT) 90 tablet 1  . furosemide (LASIX) 40 MG tablet Take 1 tablet (40 mg total) by mouth daily. 30 tablet 2  . Multiple Vitamins-Minerals (MULTIVITAMIN PO) Take 1 tablet by mouth at bedtime.    Marland Kitchen. omeprazole (PRILOSEC) 20 MG capsule Take 1 capsule (20 mg total) by mouth daily. (Patient taking differently: Take 20 mg by mouth at bedtime. ) 90 capsule 3  . Potassium Chloride ER 20 MEQ TBCR Take 20 mEq by mouth daily. 30 tablet 2  . POTASSIUM GLUCONATE PO Take 1 tablet by mouth at bedtime.    . simvastatin (ZOCOR) 20 MG tablet TAKE 1 TABLET (20 MG TOTAL) BY MOUTH EVERY EVENING. 90 tablet 4  . sotalol (BETAPACE)  80 MG tablet Take 1 tablet (80 mg total) by mouth every 12 (twelve) hours. 60 tablet 2   No current facility-administered medications for this encounter.     Allergies  Allergen Reactions  . Tramadol Other (See Comments)    Night sweats, bad dreams  . Celecoxib Other (See Comments)    REACTION: hair loss  . Cetirizine Hcl Other (See Comments)    REACTION: fatigue  . Penicillins Other (See Comments)    Unknown  . Sulfonamide Derivatives Rash    Social History   Social History  . Marital status: Divorced    Spouse name: N/A    . Number of children: 3  . Years of education: N/A   Occupational History  . Not on file.   Social History Main Topics  . Smoking status: Never Smoker  . Smokeless tobacco: Never Used  . Alcohol use Yes     Comment: 11/26/2016 "might have a drink on the holidays"  . Drug use: No  . Sexual activity: No   Other Topics Concern  . Not on file   Social History Narrative  . No narrative on file    Family History  Problem Relation Age of Onset  . CAD Mother     HEART ATTACK  . Colon cancer Father     KIDNEY FAILURE/COLON CANCER  . Atrial fibrillation Brother   . Heart failure Maternal Grandmother   . Kidney failure Maternal Grandfather   . Heart attack Paternal Grandmother   . Healthy Brother     ROS- All systems are reviewed and negative except as per the HPI above  Physical Exam: There were no vitals filed for this visit. Wt Readings from Last 3 Encounters:  11/26/16 174 lb 3.2 oz (79 kg)  11/26/16 178 lb 6.4 oz (80.9 kg)  08/04/16 174 lb (78.9 kg)    Labs: Lab Results  Component Value Date   NA 140 12/01/2016   K 3.7 12/01/2016   CL 103 12/01/2016   CO2 26 12/01/2016   GLUCOSE 91 12/01/2016   BUN 20 12/01/2016   CREATININE 1.18 (H) 12/01/2016   CALCIUM 9.0 12/01/2016   MG 1.9 12/01/2016   No results found for: INR Lab Results  Component Value Date   CHOL 136 06/19/2016   HDL 52 06/19/2016   LDLCALC 58 06/19/2016   TRIG 131 06/19/2016     GEN- The patient is well appearing, alert and oriented x 3 today.   Head- normocephalic, atraumatic Eyes-  Sclera clear, conjunctiva pink Ears- hearing intact Oropharynx- clear Neck- supple, no JVP Lymph- no cervical lymphadenopathy Lungs- Clear to ausculation bilaterally, normal work of breathing Heart- Regular rate and rhythm, no murmurs, rubs or gallops, PMI not laterally displaced GI- soft, NT, ND, + BS Extremities- no clubbing, cyanosis, or edema MS- no significant deformity or atrophy Skin- no rash or  lesion Psych- euthymic mood, full affect Neuro- strength and sensation are intact  EKG- Sinus brady at 53 bpm, pr int 134 ms, qrs int 78 ms, qtc 456ms Epic records reviewed    Assessment and Plan: 1. Persisitent afib- Doing well staying in SR on sotalol 80 mg a day Continue eliquis for CHA2DS2VAS at least 3 bmet /mag today Reduce lasix to 20 mg a day but weigh daily and if gains more that 3-5 lbs in one day resume 40 mg a day Sleep study ordered Reduce caffeine intake  F/u with Dr. Jens Somrenshaw 1/11  Elvina Sidleonna C. Matthew Folksarroll, ANP-C [facility]  [facility] [address] [place], [place] [address] [phone]

## 2016-12-05 NOTE — Telephone Encounter (Signed)
Confirmed with [facility] that patient should be taking Kdur 20meq daily so prescriptions could be processed. Pt notified.

## 2016-12-08 ENCOUNTER — Inpatient Hospital Stay (HOSPITAL_COMMUNITY)
Admission: RE | Admit: 2016-12-08 | Payer: Commercial Managed Care - HMO | Source: Ambulatory Visit | Admitting: Nurse Practitioner

## 2016-12-09 ENCOUNTER — Other Ambulatory Visit: Payer: Self-pay

## 2016-12-09 NOTE — Addendum Note (Signed)
Addended by: Alinda MoneyHUTCHINSON, Gwenda Heiner K on: 12/09/2016 06:08 PM   Modules accepted: Orders

## 2016-12-09 NOTE — Patient Outreach (Addendum)
[facility](THN) Care Management  12/09/2016  Rebekah MylarMarcia L Harrell 1941/11/24 [id number]   Telephonic Transition of Care  Referral Date:  11/28/16 [facility]  [facility] Wyatt HasteLorraine Ajel, RN Issue:  Transition of care services Admission:  11/26/2016 - 12/01/2016  Atrial fibrillation   Outreach call #1 to patient.   [address]  [place] [id number] Patient reached and states she is fine.    Providers: Patient confirmed she has not completed any MD follow-up appt's since 12/01/16 discharge date.  States she only sees her PCP once a year or when problems.  Patient has no pending appointments scheduled with PCP.  Patient has Cardiology appt scheduled 01/02/2016.   States she has completed lab appt with the Vascular clinic on 12/05/16 and scheduled again on 12/10/16.  Psycho/Social: Patient lives in her home alone.  States she is leaving Thursday 12/11/16 [place]   Advance Directives:  none DME:  Scales, eyeglasses    Co-morbidities:  Atrial fibrillation (05/2016), HF Patient confirms needing education on both conditions.   BP 136/84 12/05/2016 Weight 166 lb (75 kg) 12/05/2016 Height 63 in (160 cm) 12/05/2016 BMI 29.40 (Overweight, Pre-obe 12/05/2016  Lipid Panel completed 06/19/2016 HDL 52.000 06/19/2016 LDL 58.000 06/19/2016 Cholesterol, total 136.000 06/19/2016 Triglycerides 131.000 06/19/2016 A1C 5.800 06/19/2016  Prevnar (PCV13) N/D Pneumovax (PP 05/03/2012 Flu Vaccine N/D tDAP Vaccine 05/24/2001  Medications:  Patient confirms she has obtained all her medications and has no questions.  [facility]   Medication cost issues:  Eliquis $47.00 and Albuterol $131.00.   Encounter Medications:  Outpatient Encounter Prescriptions as of 12/09/2016  Medication Sig  . albuterol (PROVENTIL HFA;VENTOLIN HFA) 108 (90 Base) MCG/ACT inhaler Inhale 2 puffs into the lungs every 6 (six) hours as needed for wheezing or shortness of  breath.  Marland Kitchen. CALCIUM PO Take 1 tablet by mouth at bedtime.  Marland Kitchen. ELIQUIS 5 MG TABS tablet TAKE 1 TABLET(5 MG) BY MOUTH TWICE DAILY  . enalapril (VASOTEC) 20 MG tablet Take 1 tablet (20 mg total) by mouth daily.  . furosemide (LASIX) 40 MG tablet Take 0.5 tablets (20 mg total) by mouth daily.  . Multiple Vitamins-Minerals (MULTIVITAMIN PO) Take 1 tablet by mouth at bedtime.  Marland Kitchen. omeprazole (PRILOSEC) 20 MG capsule Take 1 capsule (20 mg total) by mouth daily. (Patient taking differently: Take 20 mg by mouth at bedtime. )  . Potassium Chloride ER 20 MEQ TBCR Take 20 mEq by mouth daily.  . simvastatin (ZOCOR) 20 MG tablet TAKE 1 TABLET (20 MG TOTAL) BY MOUTH EVERY EVENING.  . sotalol (BETAPACE) 80 MG tablet Take 1 tablet (80 mg total) by mouth every 12 (twelve) hours.   No facility-administered encounter medications on file as of 12/09/2016.     Functional Status:  In your present state of health, do you have any difficulty performing the following activities: 12/09/2016 11/26/2016  Hearing? N -  Vision? N -  Difficulty concentrating or making decisions? N -  Walking or climbing stairs? N -  Dressing or bathing? N -  Doing errands, shopping? N N  Preparing Food and eating ? N -  Using the Toilet? N -  In the past six months, have you accidently leaked urine? N -  Do you have problems with loss of bowel control? N -  Managing your Medications? N -  Managing your Finances? N -  Housekeeping or managing your Housekeeping? N -  Some recent data might be hidden  Fall/Depression Screening: PHQ 2/9 Scores 12/09/2016 09/07/2015 08/07/2014  PHQ - 2 Score 0 2 0  PHQ- 9 Score - 9 -    Fall Risk  12/09/2016 09/07/2015 08/07/2014  Falls in the past year? No No No   Assessment / Plan Referral date:  11/28/16 Transition of Care, Screening, Initial Assessment 12/09/16 Telephonic RN CM services 12/09/16 Program:  [facility]  RN CM advised patient in process for transition of care.  Advised expected that  patient would complete MD appointment within 7-14 days of discharge.   RN CM encouraged adherence to scheduling appointments.  Patient agreed to Telephonic Transition of care call following her return home to [facility]   [facility] Pharmacy Referral Medication cost issues:  Eliquis $47.00 and Albuterol $131.00.   RN CM advised in next [facility] scheduled contact call within next one- two weeks following return home to [facility].  RN CM advised to please notify MD of any changes in condition prior to scheduled appt's.   RN CM provided contact name and # [phone] or main office # [phone] and 24-hour nurse line # [phone].  RN CM confirmed patient is aware of 911 services for urgent emergency needs.  RN CM sent successful outreach letter and  [facility] Introductory package. RN CM sent Physician Enrollment/Barriers Letter and Initial Assessment to Primary MD RN CM notified [facility] Care Management Assistant: agreed to services/case opened.  [facility] CM Care Plan Problem One   Flowsheet Row Most Recent Value  Care Plan Problem One  Admission over the past 30 days.   Role Documenting the Problem One  Care Management Telephonic Coordinator  Care Plan for Problem One  Active  THN Long Term Goal (31-90 days)  Patient will have no readmissions over the next 31-90 days.   THN Long Term Goal Start Date  12/09/16  Interventions for Problem One Long Term Goal  RN CM will provide Transition of care services and education on avoiding readmissions over the next 31-90 days.   THN CM Short Term Goal #1 (0-30 days)  Patient will adhere to scheduling PCP appt over the next 14 days.   THN CM Short Term Goal #1 Start Date  12/09/16  Interventions for Short Term Goal #1  RN CM will provide education on expectations for transition of care follow-up over the next 14 days.   THN CM Short Term Goal #2 (0-30 days)  Patient will engage in education:  A-Fibrillation over the next 30 days.   THN CM Short Term Goal #2 Start Date  12/09/16   Interventions for Short Term Goal #2  RN CM will provide ongoing education on A-fib over the next 30 days.     [facility] CM Care Plan Problem Two   Flowsheet Row Most Recent Value  Care Plan Problem Two  Medication Assistance:  cost of medications.   Role Documenting the Problem Two  Care Management Telephonic Coordinator  Care Plan for Problem Two  Active  THN CM Short Term Goal #1 (0-30 days)  Patient will engage with [facility] Pharmacist regarding medication assistance needs over the next 30 days.   THN CM Short Term Goal #1 Start Date  12/09/16  Interventions for Short Term Goal #2   RN CM will send Pharmacy Referral over the next 30 days.     [facility] CM Care Plan Problem Three   Flowsheet Row Most Recent Value  Care Plan Problem Three  Advance Directives - none   Role Documenting the Problem Three  Care Management Telephonic Coordinator  Care Plan for Problem Three  Active  THN CM Short Term Goal #1 (0-30 days)  Patient will engage in discussion regarding advance directives over the next 30 days.   THN CM Short Term Goal #1 Start Date  12/09/16  Interventions for Short Term Goal #1  RN CM will provide education on Advance Directives over the next 30 days.      [name] Zaina Jenkin, MSHL, BSN, RN, CCM  [facility] Care Management Care Management Coordinator [phone] Direct [phone] Cell [phone] Office [phone] Fax [email or web address]

## 2016-12-10 ENCOUNTER — Other Ambulatory Visit (HOSPITAL_COMMUNITY): Payer: Self-pay | Admitting: *Deleted

## 2016-12-10 ENCOUNTER — Ambulatory Visit (HOSPITAL_COMMUNITY)
Admission: RE | Admit: 2016-12-10 | Discharge: 2016-12-10 | Disposition: A | Discharge status: Home / Self Care | Payer: Commercial Managed Care - HMO | Source: Ambulatory Visit | Attending: Nurse Practitioner | Admitting: Nurse Practitioner

## 2016-12-10 DIAGNOSIS — I129 Hypertensive chronic kidney disease with stage 1 through stage 4 chronic kidney disease, or unspecified chronic kidney disease: Secondary | ICD-10-CM | POA: Insufficient documentation

## 2016-12-10 DIAGNOSIS — E785 Hyperlipidemia, unspecified: Secondary | ICD-10-CM | POA: Insufficient documentation

## 2016-12-10 DIAGNOSIS — I480 Paroxysmal atrial fibrillation: Secondary | ICD-10-CM

## 2016-12-10 DIAGNOSIS — N183 Chronic kidney disease, stage 3 (moderate): Secondary | ICD-10-CM | POA: Insufficient documentation

## 2016-12-10 DIAGNOSIS — I481 Persistent atrial fibrillation: Secondary | ICD-10-CM | POA: Diagnosis not present

## 2016-12-10 LAB — BASIC METABOLIC PANEL
ANION GAP: 8 (ref 5–15)
BUN: 22 mg/dL — ABNORMAL HIGH (ref 6–20)
CALCIUM: 9.6 mg/dL (ref 8.9–10.3)
CO2: 28 mmol/L (ref 22–32)
Chloride: 105 mmol/L (ref 101–111)
Creatinine, Ser: 1.16 mg/dL — ABNORMAL HIGH (ref 0.44–1.00)
GFR calc Af Amer: 52 mL/min — ABNORMAL LOW (ref >60)
GFR, EST NON AFRICAN AMERICAN: 45 mL/min — AB (ref >60)
Glucose, Bld: 88 mg/dL (ref 65–99)
POTASSIUM: 4.2 mmol/L (ref 3.5–5.1)
SODIUM: 141 mmol/L (ref 135–145)

## 2016-12-10 LAB — MAGNESIUM: MAGNESIUM: 1.7 mg/dL (ref 1.7–2.4)

## 2016-12-10 MED ORDER — MAGNESIUM 200 MG PO TABS: 200.0000 mg | ORAL_TABLET | Freq: Every day | ORAL | Status: DC

## 2016-12-12 ENCOUNTER — Encounter (HOSPITAL_COMMUNITY): Payer: Commercial Managed Care - HMO | Admitting: Nurse Practitioner

## 2016-12-17 ENCOUNTER — Other Ambulatory Visit: Payer: Self-pay

## 2016-12-17 NOTE — Patient Outreach (Signed)
[facility]N) Care Management  12/17/2016  Wilburn MylarMarcia L Naron [birth year] [id number]   Transition of Care Services  Referral Date:  11/28/16 [facility]  [facility] Wyatt HasteLorraine Ajel, RN Issue:  Transition of care services Admission:  11/26/2016 - 12/01/2016  Atrial fibrillation  Transition of Care, Screening, Initial Assessment date: 12/09/16 Telephonic RN CM services 12/09/16 Program:  TOC (Short Term 12/09/16)  Outreach call #1.  Patient not reached.  RN CM left HIPAA compliant voice message with name and number.  RN CM scheduled for next outreach call within one week.   [name] Dashia Caldeira, MSHL, BSN, RN, CCM  [facility] Care Management Care Management Coordinator [phone] Direct [phone] Cell [phone] Office [phone] Fax [email or web address]

## 2016-12-18 ENCOUNTER — Other Ambulatory Visit: Payer: Self-pay

## 2016-12-18 NOTE — Patient Outreach (Signed)
[facility](THN) Care Management  12/18/2016  Rebekah MylarMarcia L Harrell 1941-10-29 [id number]   Transition of Care Services  Referral Date:  11/28/16 [facility]  [facility] Wyatt HasteLorraine Ajel, RN Issue:  Transition of care services Admission:  11/26/2016 - 12/01/2016  Atrial fibrillation  Transition of Care, Screening, Initial Assessment date: 12/09/16 Telephonic RN CM services 12/09/16 Program:  TOC (Short Term 12/09/16)  Outreach call #1.  Patient not reached.  RN CM left HIPAA compliant voice message with name and number.  RN CM scheduled for next outreach call within one week.   Rebekah Daviesrystal Reinhard Harrell, MSHL, BSN, RN, CCM  [facility] Care Management Care Management Coordinator [phone] Direct [phone] Cell [phone] Office [phone] Fax [email or web address] [facility]N) Care Management  12/18/2016  Rebekah MylarMarcia L Rebekah Harrell 1941-10-29 [id number]   Telephonic Transition of Care  Referral date:  11/28/16 Transition of Care, Screening, Initial Assessment 12/09/16 Telephonic RN CM services 12/09/16 Program:  Rebekah SimsC (Short Term) 12/09/16 Admission:  11/26/2016 - 12/01/2016  Atrial fibrillation   Outreach call #2 to patient.  Patient states she is at work and unable to talk right now.  Request call back next week.   [address]  [place] [phone]  Plan RN CM will follow-up on outcomes of 12/09/16 RN CM interventions: -advised patient in process for transition of care.  Advised expected that patient would complete MD appointment within 7-14 days of discharge.   -RN CM encouraged adherence to scheduling appointments.  -Patient agreed to Telephonic Transition of care call [place]   [facility] (sent 12/09/16) Medication cost issues:  Eliquis $47.00 and Albuterol $131.00.    Rebekah Daviesrystal Rebekah Harrell, MSHL, BSN, RN, CCM  [facility] Care Management Care Management  Coordinator [phone] Direct [phone] Cell [phone] Office [phone] Fax [email or web address]

## 2016-12-24 ENCOUNTER — Other Ambulatory Visit: Payer: Self-pay

## 2016-12-24 NOTE — Patient Outreach (Signed)
  [facility]N) Care Management  12/24/2016  [name] May 31, 1941 [id number]   Telephonic Transition of Care  Referral date:  11/28/16 Transition of Care, Screening, Initial Assessment 12/09/16 Telephonic RN CM services 12/09/16 Program:  [name] (Short Term) 12/09/16 Admission:  11/26/2016 - 12/01/2016  Atrial fibrillation  Insurance:  [employer]  Outreach call #3 to patient.  Patient not reached.  [address]  [place] [phone] RN CM left HIPAA compliant voice message with name and number.  RN CM mailed unsuccessful outreach letter and will close case within two weeks if no response received back from patient.   [name] Dailyn Kempner, MSHL, BSN, RN, CCM  [facility] Care Management Care Management Coordinator [phone] Direct [phone] Cell [phone] Office [phone] Fax [name]

## 2016-12-26 ENCOUNTER — Other Ambulatory Visit: Payer: Self-pay | Admitting: Pharmacist

## 2016-12-26 NOTE — Patient Outreach (Signed)
[facility]) Care Management  12/26/2016  Rebekah MylarMarcia L Harrell July 23, 1941 [id number]   [facility] for medication assistance and education from [facility] Crystal Hutchinson.    Unsuccessful outreach phone call attempt to patient on 12/26/2016.    Plan: Will re-try calling patient early next week.    Tommye StandardKevin Koraima Albertsen, PharmD, [facility] Clinical Pharmacist [facility] [phone]

## 2016-12-29 ENCOUNTER — Other Ambulatory Visit: Payer: Self-pay | Admitting: Pharmacist

## 2016-12-29 NOTE — Patient Outreach (Signed)
[facility]N) Care Management  12/29/2016  [name] 04/02/1941 [id number]  Unsuccessful second telephonic outreach attempt made to patient for medication assistance by [facility].     Plan: Will try calling patient again later this week.    Tommye StandardKevin Ceasia Elwell, PharmD, [facility] Clinical Pharmacist [facility] [phone]

## 2016-12-29 NOTE — Progress Notes (Signed)
HPI: FU atrial fibrillation.  Patient with new diagnosis of atrial fibrillation in June 2017. Stress echocardiogram 2011 showed poor exercise tolerance but no ischemia. Laboratories June 2017 showed TSH 2.49. Admitted December 2017 with atrial fibrillation with rapid ventricular response and acute diastolic congestive heart failure. Echo December 2017 showed normal LV systolic function, elevated left ventricular filling pressures, mild mitral regurgitation and mild biatrial enlargement. Patient was placed on sotalol and converted to sinus rhythm. Sotalol dose was decreased to 80 mg twice a day because of prolonged QT interval. Metoprolol was discontinued because of bradycardia. Since last seen, her dyspnea is markedly improved. She has dyspnea with more vigorous activities. No orthopnea or PND. No chest pain, palpitations or syncope.  Current Outpatient Prescriptions  Medication Sig Dispense Refill  . albuterol (PROVENTIL HFA;VENTOLIN HFA) 108 (90 Base) MCG/ACT inhaler Inhale 2 puffs into the lungs every 6 (six) hours as needed for wheezing or shortness of breath.    Marland Kitchen CALCIUM PO Take 1 tablet by mouth at bedtime.    Marland Kitchen ELIQUIS 5 MG TABS tablet TAKE 1 TABLET(5 MG) BY MOUTH TWICE DAILY 60 tablet 1  . enalapril (VASOTEC) 20 MG tablet Take 1 tablet (20 mg total) by mouth daily. 30 tablet 0  . furosemide (LASIX) 40 MG tablet Take 0.5 tablets (20 mg total) by mouth daily. 30 tablet 2  . Magnesium 200 MG TABS Take 1 tablet (200 mg total) by mouth daily. 60 each   . Multiple Vitamins-Minerals (MULTIVITAMIN PO) Take 1 tablet by mouth at bedtime.    Marland Kitchen omeprazole (PRILOSEC) 20 MG capsule Take 1 capsule (20 mg total) by mouth daily. (Patient taking differently: Take 20 mg by mouth at bedtime. ) 90 capsule 3  . Potassium Chloride ER 20 MEQ TBCR Take 20 mEq by mouth daily. 30 tablet 2  . simvastatin (ZOCOR) 20 MG tablet TAKE 1 TABLET (20 MG TOTAL) BY MOUTH EVERY EVENING. 90 tablet 4  . sotalol (BETAPACE)  80 MG tablet Take 1 tablet (80 mg total) by mouth every 12 (twelve) hours. 60 tablet 2   No current facility-administered medications for this visit.      Past Medical History:  Diagnosis Date  . Aortic atherosclerosis (HCC)   . Arthritis    "lower back; knees" (11/26/2016)  . Asthma   . Chronic atrial fibrillation (HCC)   . CKD (chronic kidney disease), stage III    patient unaware of this on 11/26/2016  . Complication of anesthesia    "had trouble waking me up in the late 1990's after gallbladder OR"  . GERD (gastroesophageal reflux disease)   . Heart murmur    "dx'd when I was a little kid"  . History of hiatal hernia   . Hyperlipidemia   . Hypertension   . Hypoglycemia     Past Surgical History:  Procedure Laterality Date  . CHOLECYSTECTOMY OPEN  1990s  . TONSILLECTOMY    . TUBAL LIGATION      Social History   Social History  . Marital status: Divorced    Spouse name: N/A  . Number of children: 3  . Years of education: N/A   Occupational History  . Not on file.   Social History Main Topics  . Smoking status: Never Smoker  . Smokeless tobacco: Never Used  . Alcohol use Yes     Comment: 11/26/2016 "might have a drink on the holidays"  . Drug use: No  . Sexual activity: No   Other  Topics Concern  . Not on file   Social History Narrative  . No narrative on file    Family History  Problem Relation Age of Onset  . CAD Mother     HEART ATTACK  . Colon cancer Father     KIDNEY FAILURE/COLON CANCER  . Atrial fibrillation Brother   . Heart failure Maternal Grandmother   . Kidney failure Maternal Grandfather   . Heart attack Paternal Grandmother   . Healthy Brother     ROS: no fevers or chills, productive cough, hemoptysis, dysphasia, odynophagia, melena, hematochezia, dysuria, hematuria, rash, seizure activity, orthopnea, PND, pedal edema, claudication. Remaining systems are negative.  Physical Exam: Well-developed well-nourished in no acute distress.   Skin is warm and dry.  HEENT is normal.  Neck is supple. No bruits Chest is clear to auscultation with normal expansion.  Cardiovascular exam is regular rate and rhythm.  Abdominal exam nontender or distended. No masses palpated. Extremities show no edema. neuro grossly intact  A/P  1 paroxysmal atrial fibrillation-patient remains in sinus rhythm on examination. Continue low-dose sotalol. Note her dose was reduced in the hospital because of QT prolongation. Continue apixaban.   2 Chronic diastolic CHF-continue present dose of Lasix. Check potassium, renal function and magnesium.  3 hypertension-blood pressure elevated. Increase enalapril to 20 mg twice a day. Check potassium and renal function in 1 week.   4 hyperlipidemia-continue statin.  Olga MillersBrian Houa Ackert, MD

## 2017-01-01 ENCOUNTER — Encounter: Payer: Self-pay | Admitting: Cardiology

## 2017-01-01 ENCOUNTER — Ambulatory Visit (INDEPENDENT_AMBULATORY_CARE_PROVIDER_SITE_OTHER): Payer: Medicare HMO | Admitting: Cardiology

## 2017-01-01 VITALS — BP 152/74 | HR 62 | Ht 63.0 in | Wt 167.0 lb

## 2017-01-01 DIAGNOSIS — I10 Essential (primary) hypertension: Secondary | ICD-10-CM

## 2017-01-01 DIAGNOSIS — E7800 Pure hypercholesterolemia, unspecified: Secondary | ICD-10-CM | POA: Diagnosis not present

## 2017-01-01 DIAGNOSIS — I5032 Chronic diastolic (congestive) heart failure: Secondary | ICD-10-CM

## 2017-01-01 DIAGNOSIS — I480 Paroxysmal atrial fibrillation: Secondary | ICD-10-CM | POA: Diagnosis not present

## 2017-01-01 MED ORDER — ENALAPRIL MALEATE 20 MG PO TABS: 20.0000 mg | ORAL_TABLET | Freq: Two times a day (BID) | ORAL | 3 refills | Status: DC

## 2017-01-01 NOTE — Patient Instructions (Signed)
Medication Instructions:   INCREASE ENALAPRIL TO 20 MG TWICE DAILY  Labwork:  Your physician recommends that you return for lab work in: ONE WEEK  Follow-Up:  Your physician wants you to follow-up in: 3 MONTHS WITH DR Jens SomRENSHAW IN [place] You will receive a reminder letter in the mail two months in advance. If you don't receive a letter, please call our office to schedule the follow-up appointment.

## 2017-01-02 ENCOUNTER — Other Ambulatory Visit: Payer: Self-pay | Admitting: Pharmacist

## 2017-01-02 ENCOUNTER — Encounter: Payer: Self-pay | Admitting: Pharmacist

## 2017-01-02 NOTE — Patient Outreach (Addendum)
[facility]N) Care Management  01/02/2017  Wilburn MylarMarcia L Douthitt 03-10-1941 [id number]   Third unsuccessful telephone attempt by [facility] to reach patient regarding medication assistance.  Left HIPAA compliant voice message for patient to return phone call.   Plan: Will send outreach letter to patient. If no response from patient in 10 business days, will close case.    Tommye StandardKevin Yousif Edelson, PharmD, [facility] Clinical Pharmacist [facility] [phone]  ADDENDUM:  Received telephone callback from patient.  HIPAA identifiers verified.  Patient requesting information on medication assistance with Eliquis (apixaban). States she does not have trouble affording albuterol inhaler at this time as she received sample from MD office and rarely uses it.    Discussed various patient assistance options with patient including [employer] patient assistance program which patient thinks she qualifies for with her current annual income.  However it also requires Part D beneficiaries spend at least 3% of annual income on medications which patient feels will take several months. Pt does not currently qualify for Extra Help.  Also reviewed other anticoagulations for atrial fibrillation per patient request but referred patient to cardiologist for further information and decision making.   Plan:  Will close pharmacy case at this time as all questions reviewed and addressed with patient.  Patient encouraged to contact [facility] pharmacy in the future after she meets Eliquis patient assistance program requirements and I will be happy to help with paperwork or other issues / concerns.    Will not mail patient outreach letter as she returned call.    Tommye StandardKevin Huck Ashworth, PharmD, [facility] Clinical Pharmacist [facility] [phone]  Phone outreach was completed by [facility] Pharm[facility], Jill Sideolleen, whom is currently.  Patient was discussed between myself and Colleen.

## 2017-01-06 ENCOUNTER — Telehealth: Payer: Self-pay | Admitting: Cardiology

## 2017-01-06 MED ORDER — AMLODIPINE BESYLATE 5 MG PO TABS: 5.0000 mg | ORAL_TABLET | Freq: Every day | ORAL | 3 refills | Status: DC

## 2017-01-06 NOTE — Telephone Encounter (Signed)
Add amlodipine 5 mg daily  Brian Crenshaw   

## 2017-01-06 NOTE — Telephone Encounter (Signed)
New message       Pt c/o BP issue: STAT if pt c/o blurred vision, one-sided weakness or slurred speech  1. What are your last 5 BP readings?  182/87 HR 62, 181/86 HR 61, 170/78 HR 62, 184/81 HR 61 home monitor; 166[facility]  2. Are you having any other symptoms (ex. Dizziness, headache, blurred vision, passed out)? Headache 2 nights ago 3. What is your BP issue?  bp is elevated. Please advise

## 2017-01-06 NOTE — Telephone Encounter (Signed)
Advised patient, verbalized understanding  

## 2017-01-06 NOTE — Telephone Encounter (Signed)
Spoke with patient and she saw Dr Jens Somrenshaw last week and Enalapril increased to twice a day. She started the increase 01/03/17 and blood pressure has been going up She does not know the times of blood pressure readings in regards to medications being taken. Blood pressure this morning was 171/87 before medications, at work and unable to check blood pressure at this time. Patient concerned since going up and wanted Dr Jens Somrenshaw to be aware Will forward for review

## 2017-01-07 ENCOUNTER — Other Ambulatory Visit: Payer: Self-pay

## 2017-01-07 NOTE — Patient Outreach (Addendum)
  [facility](THN) Care Management  01/07/2017  Wilburn MylarMarcia L Hofmeister October 30, 1941 [id number]   Telephonic Transition of Care  Referral date:  11/28/16 Transition of Care, Screening, Initial Assessment 12/09/16 Telephonic RN CM services 12/09/16 Program:  [name] (Short Term) 12/09/16 Admission:  11/26/2016 - 12/01/2016  Atrial fibrillation  Insurance:  [employer]  Case closed:  Unable to reach patient with several phone attempts and no response received back from unsuccessful outreach letter.  Case closure letter sent to MD and patient.  THN notified:  patient not responding to follow-up call attempts.   [name] Bernita Beckstrom, MSHL, BSN, RN, CCM  [facility] Care Management Care Management Coordinator [phone] Direct [phone] Cell [phone] Office [phone] Fax [email or web address]

## 2017-01-15 DIAGNOSIS — I480 Paroxysmal atrial fibrillation: Secondary | ICD-10-CM | POA: Diagnosis not present

## 2017-01-16 LAB — BASIC METABOLIC PANEL
BUN: 16 mg/dL (ref 7–25)
CALCIUM: 9.5 mg/dL (ref 8.6–10.4)
CO2: 28 mmol/L (ref 20–31)
CREATININE: 0.94 mg/dL — AB (ref 0.60–0.93)
Chloride: 105 mmol/L (ref 98–110)
GLUCOSE: 140 mg/dL — AB (ref 65–99)
Potassium: 4.1 mmol/L (ref 3.5–5.3)
Sodium: 140 mmol/L (ref 135–146)

## 2017-01-16 LAB — MAGNESIUM: Magnesium: 1.8 mg/dL (ref 1.5–2.5)

## 2017-01-17 ENCOUNTER — Other Ambulatory Visit: Payer: Self-pay | Admitting: Internal Medicine

## 2017-01-19 ENCOUNTER — Other Ambulatory Visit: Payer: Self-pay | Admitting: Family Medicine

## 2017-01-19 DIAGNOSIS — I482 Chronic atrial fibrillation, unspecified: Secondary | ICD-10-CM

## 2017-01-23 ENCOUNTER — Encounter (HOSPITAL_BASED_OUTPATIENT_CLINIC_OR_DEPARTMENT_OTHER): Payer: Commercial Managed Care - HMO

## 2017-01-28 ENCOUNTER — Telehealth: Payer: Self-pay | Admitting: Family Medicine

## 2017-01-28 NOTE — Telephone Encounter (Signed)
Patient declined annual flu shot.  °

## 2017-02-17 ENCOUNTER — Other Ambulatory Visit: Payer: Self-pay | Admitting: Family Medicine

## 2017-02-17 DIAGNOSIS — I482 Chronic atrial fibrillation, unspecified: Secondary | ICD-10-CM

## 2017-06-12 ENCOUNTER — Other Ambulatory Visit (HOSPITAL_COMMUNITY): Payer: Self-pay | Admitting: Nurse Practitioner

## 2017-06-13 ENCOUNTER — Encounter: Payer: Self-pay | Admitting: Emergency Medicine

## 2017-06-13 ENCOUNTER — Emergency Department
Admission: EM | Admit: 2017-06-13 | Discharge: 2017-06-13 | Disposition: A | Discharge status: Home / Self Care | Payer: Medicare HMO | Source: Home / Self Care

## 2017-06-13 DIAGNOSIS — R21 Rash and other nonspecific skin eruption: Secondary | ICD-10-CM | POA: Diagnosis not present

## 2017-06-13 DIAGNOSIS — W57XXXA Bitten or stung by nonvenomous insect and other nonvenomous arthropods, initial encounter: Secondary | ICD-10-CM

## 2017-06-13 NOTE — Discharge Instructions (Signed)
Benadryl 25mg  every 4 hour.  Soak hand 20 minutes 4 times a day.  Return here tomorrow for recheck if worse or not improving

## 2017-06-13 NOTE — ED Triage Notes (Signed)
Pt states that she was outside working on windows, left hand is swelling, redness, itchy, not sure of something bit her, inside of palm is tender to touch.

## 2017-06-13 NOTE — ED Provider Notes (Signed)
CSN: [id number]     Arrival date & time 06/13/17  1151 History   None    Chief Complaint  Patient presents with  . Insect Bite   (Consider location/radiation/quality/duration/timing/severity/associated sxs/prior Treatment) The history is provided by the patient. No language interpreter was used.  Hand Pain  This is a new problem. The current episode started yesterday. The problem occurs constantly. The problem has been gradually worsening. Pertinent negatives include no chest pain. Nothing aggravates the symptoms. Nothing relieves the symptoms. She has tried nothing for the symptoms. The treatment provided no relief.  Pt thinks she may have been bitten by something.  Pt concerned about a spider bite.  Pt was working on windows yesterday.  She has a sore area on her palm.    Past Medical History:  Diagnosis Date  . Aortic atherosclerosis (HCC)   . Arthritis    "lower back; knees" (11/26/2016)  . Asthma   . Chronic atrial fibrillation (HCC)   . CKD (chronic kidney disease), stage III    patient unaware of this on 11/26/2016  . Complication of anesthesia    "had trouble waking me up in the late 1990's after gallbladder OR"  . GERD (gastroesophageal reflux disease)   . Heart murmur    "dx'd when I was a little kid"  . History of hiatal hernia   . Hyperlipidemia   . Hypertension   . Hypoglycemia    Past Surgical History:  Procedure Laterality Date  . CHOLECYSTECTOMY OPEN  1990s  . TONSILLECTOMY    . TUBAL LIGATION     Family History  Problem Relation Age of Onset  . CAD Mother        HEART ATTACK  . Colon cancer Father        KIDNEY FAILURE/COLON CANCER  . Atrial fibrillation Brother   . Heart failure Maternal Grandmother   . Kidney failure Maternal Grandfather   . Heart attack Paternal Grandmother   . Healthy Brother    Social History  Substance Use Topics  . Smoking status: Never Smoker  . Smokeless tobacco: Never Used  . Alcohol use Yes     Comment: 11/26/2016 "might  have a drink on the holidays"   OB History    No data available     Review of Systems  Cardiovascular: Negative for chest pain.    Allergies  Tramadol; Celecoxib; Cetirizine hcl; Penicillins; and Sulfonamide derivatives  Home Medications   Prior to Admission medications   Medication Sig Start Date End Date Taking? Authorizing Provider  albuterol (PROVENTIL HFA;VENTOLIN HFA) 108 (90 Base) MCG/ACT inhaler Inhale 2 puffs into the lungs every 6 (six) hours as needed for wheezing or shortness of breath.    [provider]  amLODipine (NORVASC) 5 MG tablet Take 1 tablet (5 mg total) by mouth daily. 01/06/17 04/06/17  Lewayne Bunting, MD  CALCIUM PO Take 1 tablet by mouth at bedtime.    [provider]  ELIQUIS 5 MG TABS tablet TAKE 1 TABLET(5 MG) BY MOUTH TWICE DAILY 02/17/17   Agapito Games, MD  enalapril (VASOTEC) 20 MG tablet Take 1 tablet (20 mg total) by mouth 2 (two) times daily. 01/01/17   Lewayne Bunting, MD  furosemide (LASIX) 40 MG tablet TAKE 1 TABLET EVERY DAY 01/20/17   Newman Nip, NP  Magnesium 200 MG TABS Take 1 tablet (200 mg total) by mouth daily. 12/10/16   Newman Nip, NP  Multiple Vitamins-Minerals (MULTIVITAMIN PO) Take 1 tablet  by mouth at bedtime.    [provider]  omeprazole (PRILOSEC) 20 MG capsule Take 1 capsule (20 mg total) by mouth daily. Patient taking differently: Take 20 mg by mouth at bedtime.  05/23/16   Agapito GamesMetheney, [name] D, MD  Potassium Chloride ER 20 MEQ TBCR Take 20 mEq by mouth daily. 12/01/16   Marily LenteSeiler, Amber K, NP  potassium chloride SA (K-DUR,KLOR-CON) 20 MEQ tablet TAKE 1 TABLET EVERY DAY 01/20/17   Newman Niparroll, Donna C, NP  simvastatin (ZOCOR) 20 MG tablet TAKE 1 TABLET (20 MG TOTAL) BY MOUTH EVERY EVENING. 08/19/16   Agapito GamesMetheney, [name] D, MD  sotalol (BETAPACE) 80 MG tablet Take 1 tablet (80 mg total) by mouth every 12 (twelve) hours. 01/20/17   AllredFayrene Fearing, James, MD   Meds Ordered and Administered this Visit   Medications - No data to display  BP (!) 179/80 (BP Location: Left Arm)   Pulse 61   Temp 98.6 F (37 C) (Oral)   Ht 5\' 4"  (1.626 m)   Wt 167 lb 4 oz (75.9 kg)   SpO2 98%   BMI 28.71 kg/m  No data found.   Physical Exam  Constitutional: She appears well-developed and well-nourished.  Musculoskeletal: She exhibits tenderness.  Small pinpoint area palm, tender,  Hand swollen, no heat,  No erythema.  Neurological: She is alert.  Skin: Skin is warm.  Psychiatric: She has a normal mood and affect.  Vitals reviewed.   Urgent Care Course     Procedures (including critical care time)  Labs Review Labs Reviewed - No data to display  Imaging Review No results found.   Visual Acuity Review  Right Eye Distance:   Left Eye Distance:   Bilateral Distance:    Right Eye Near:   Left Eye Near:    Bilateral Near:         MDM I suspect insect bite, doubt spider,   I advised soak area, benadryl,  Elevate.  Pt is to recheck here tomorrow if not improving   1. Insect bite, initial encounter    An After Visit Summary was printed and given to the patient.     Elson AreasSofia, Hilaria Titsworth K, [place] JerseyPA-C 06/13/17 1304

## 2017-06-15 ENCOUNTER — Telehealth: Payer: Self-pay

## 2017-06-15 ENCOUNTER — Other Ambulatory Visit: Payer: Self-pay | Admitting: Family Medicine

## 2017-06-15 NOTE — Telephone Encounter (Signed)
Left message on VM to call if questions or problems.

## 2017-07-19 IMAGING — DX DG CHEST 2V
2 series · 2 of 2 positions shown · non-contrast
Comparison: Thoracic spine series of March 01, 2014

CLINICAL DATA: Two months of dyspnea on exertion with bilateral
lower extremity edema ; similar symptoms in the past; history of
chronic renal insufficiency, hypertension.

EXAM:
CHEST  2 VIEW

[chest pa]
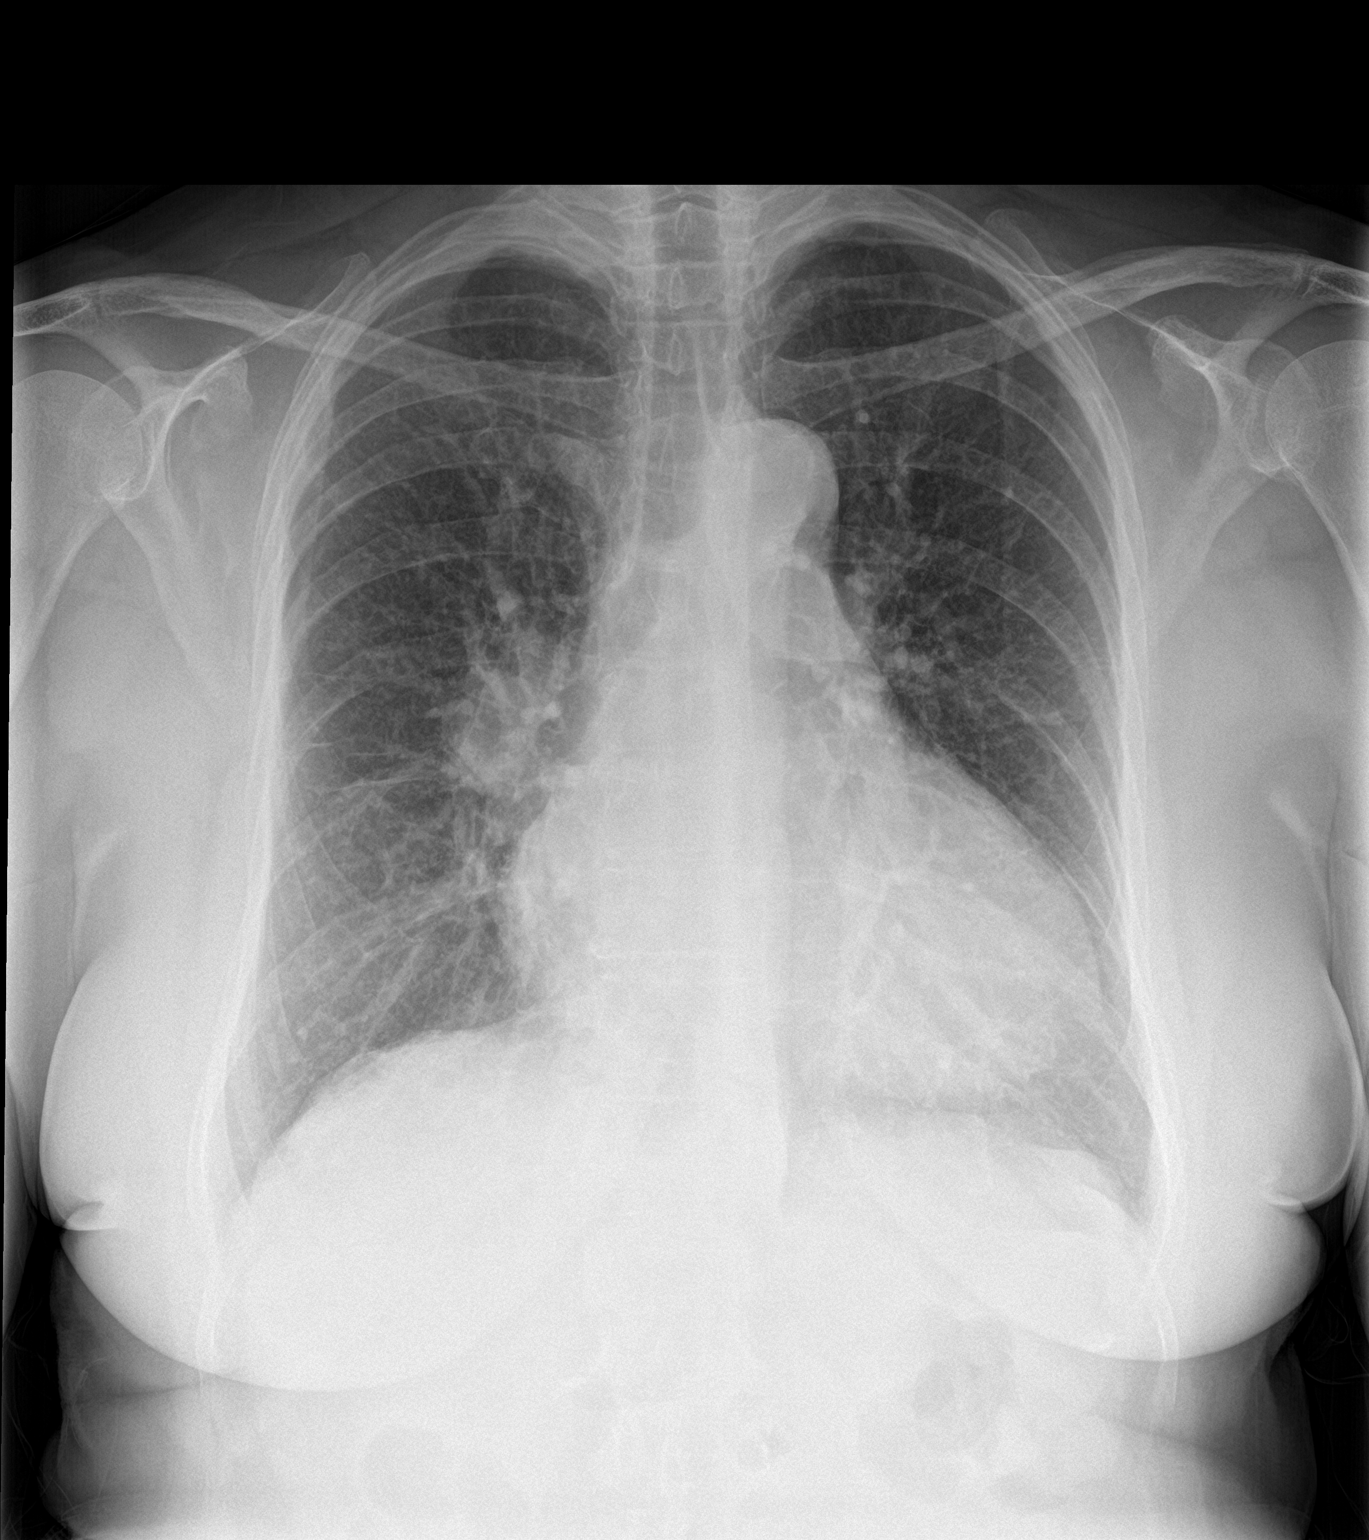

[chest lat]
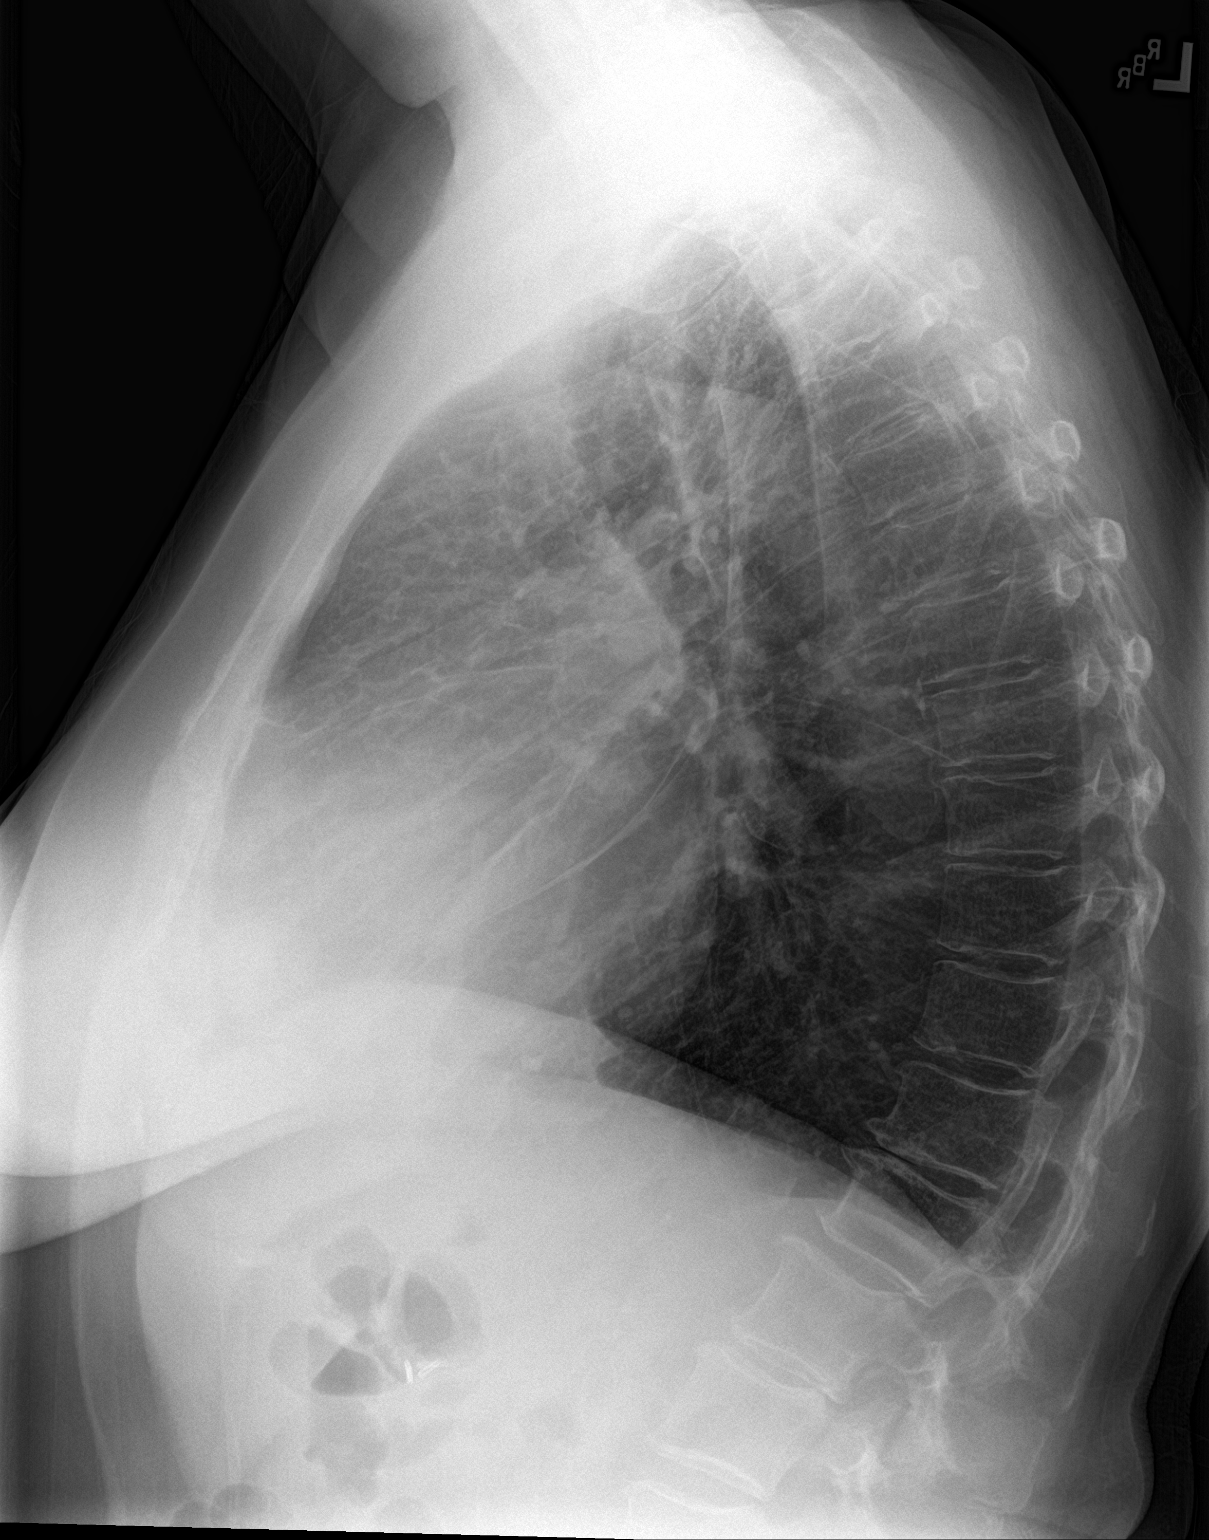

[2 of 2 positions shown; findings below may reference images not displayed]

FINDINGS: The lungs are well-expanded. The interstitial markings are coarse.
The cardiac silhouette is enlarged. There is prominence of the right
hilar structures which is not new. The central pulmonary vascularity
appears mildly engorged. There is no pleural effusion or
pneumothorax. There is no alveolar pneumonia. There is calcification
in the wall of the aortic arch and descending thoracic aorta. The
bony thorax exhibits no acute abnormality.
IMPRESSION: 1. Mild hyperinflation consistent with COPD or reactive airway
disease. CHF with mild interstitial edema. Chronic right hilar
prominence is observed.
2. Aortic atherosclerosis.

## 2017-07-31 ENCOUNTER — Encounter: Payer: Self-pay | Admitting: Physician Assistant

## 2017-07-31 ENCOUNTER — Other Ambulatory Visit: Payer: Self-pay | Admitting: Physician Assistant

## 2017-07-31 ENCOUNTER — Ambulatory Visit (INDEPENDENT_AMBULATORY_CARE_PROVIDER_SITE_OTHER): Payer: Medicare HMO | Admitting: Physician Assistant

## 2017-07-31 VITALS — BP 154/79 | HR 52 | Temp 97.6°F | Wt 168.0 lb

## 2017-07-31 DIAGNOSIS — T783XXA Angioneurotic edema, initial encounter: Secondary | ICD-10-CM

## 2017-07-31 DIAGNOSIS — I10 Essential (primary) hypertension: Secondary | ICD-10-CM | POA: Diagnosis not present

## 2017-07-31 DIAGNOSIS — T464X1A Poisoning by angiotensin-converting-enzyme inhibitors, accidental (unintentional), initial encounter: Secondary | ICD-10-CM

## 2017-07-31 DIAGNOSIS — Z9189 Other specified personal risk factors, not elsewhere classified: Secondary | ICD-10-CM

## 2017-07-31 DIAGNOSIS — L508 Other urticaria: Secondary | ICD-10-CM | POA: Diagnosis not present

## 2017-07-31 DIAGNOSIS — T464X5A Adverse effect of angiotensin-converting-enzyme inhibitors, initial encounter: Secondary | ICD-10-CM

## 2017-07-31 MED ORDER — EPINEPHRINE 0.3 MG/0.3ML IJ SOAJ: 0.3000 mg | Freq: Once | INTRAMUSCULAR | 1 refills | Status: DC | PRN

## 2017-07-31 MED ORDER — FEXOFENADINE HCL 180 MG PO TABS: 180.0000 mg | ORAL_TABLET | Freq: Every day | ORAL | 3 refills | Status: DC

## 2017-07-31 MED ORDER — HYDRALAZINE HCL 25 MG PO TABS: 25.0000 mg | ORAL_TABLET | Freq: Three times a day (TID) | ORAL | 0 refills | Status: DC

## 2017-07-31 NOTE — Patient Instructions (Addendum)
- stop your Enalapril (ACE inhibitor)  - start Hydralazine three times daily for blood pressure - continue your other blood pressure medications - limit sodium to 1500mg /day and follow DASH eating plan - Keep EpiPen in your home and in your purse - continue daily antihistamine  - follow-up with PCP in 1 week - you will be contacted in 3-5 days about Allergist referral    Angioedema Angioedema is the sudden swelling of tissue in the body. Angioedema can affect any part of the body, but it most often affects the deeper parts of the skin, causing red, itchy patches (hives) to appear over the affected area. It often begins during the night and is found in the morning. Depending on the cause, angioedema may happen:  Only once.  Several times. It may come back in unpredictable patterns.  Repeatedly for several years. Over time, it may gradually stop coming back.  Angioedema can be life-threatening if it affects the air passages that you breathe through. What are the causes? This condition may be caused by:  Foods, such as milk, eggs, shellfish, wheat, or nuts.  Certain medicines, such as ACE inhibitors, antibiotics, nonsteroidal anti-inflammatory drugs, birth control pills, or dyes used in X-rays.  Insect stings.  Infections.  Angioedema can be inherited, and episodes can be triggered by:  Mild injury.  Dental work.  Surgery.  Stress.  Sudden changes in temperature.  Exercise.  In some cases, the cause of this condition is not known. What are the signs or symptoms? Symptoms of this condition depend on where the swelling happens. Symptoms may include:  Swollen skin.  Red, itchy patches of skin (hives).  Redness in the affected area.  Pain in the affected area.  Swollen lips or tongue.  Wheezing.  Breathing problems.  If your internal organs are involved, symptoms may also include:  Nausea.  Abdominal pain.  Vomiting.  Difficulty  swallowing.  Difficulty passing urine.  How is this diagnosed? This condition may be diagnosed based on:  An exam of the affected area.  Your medical history.  Whether anyone in your family has had this condition before.  A review of any medicines you have been taking.  Tests, including: ? Allergy skin tests to see if the condition was caused by an allergic reaction. ? Blood tests to see if the condition was caused by a gene. ? Tests to check for underlying diseases that could cause the condition.  How is this treated? Treatment for this condition depends on the cause. It may involve any of the following:  If something triggered the condition, making changes to keep it from triggering the condition again.  If the condition affects your breathing, having tubes placed in your airway to keep it open.  Taking medicines to treat symptoms or prevent future episodes. These may include: ? Antihistamines. ? Epinephrine injections. ? Steroids.  If your condition is severe, you may need to be treated at the hospital. Angioedema usually gets better in 24-48 hours. Follow these instructions at home:  Take over-the-counter and prescription medicines only as told by your health care provider.  If you were given medicines for emergency allergy treatment, always carry them with you.  Wear a medical bracelet as told by your health care provider.  If something triggers your condition, avoid the trigger, if possible.  If your condition is inherited and you are thinking about having children, talk to your health care provider. It is important to discuss the risks of passing on the  condition to your children. Contact a health care provider if:  You have repeated episodes of angioedema.  Episodes of angioedema start to happen more often than they used to, even after you take steps to prevent them.  You have episodes of angioedema that are more severe than they have been before, even after  you take steps to prevent them.  You are thinking about having children. Get help right away if:  You have severe swelling of your mouth, tongue, or lips.  You have trouble breathing.  You have trouble swallowing.  You faint. This information is not intended to replace advice given to you by your health care provider. Make sure you discuss any questions you have with your health care provider. Document Released: 02/16/2002 Document Revised: 07/05/2016 Document Reviewed: 06/17/2016 Elsevier Interactive Patient Education  [employer]2018 Elsevier Inc.

## 2017-07-31 NOTE — Progress Notes (Signed)
HPI:                                                                Rebekah Harrell is a 76 y.o. female who presents to [facility] today for "allergic reaction"  Patient reports intermittent episodes of hand swelling and redness, lip swelling and tingling, pruritic hives on her abdomen and one episode of throat swelling. Symptoms respond to antihistamine. She is on an ACE inhibitor. She has a history of asthma and allergy to sulfa drugs.  Patient also reports history of a tick bite 1 year ago. States she was treated with antibiotic. She is concerned her symptoms may be due to Lyme disease.  Past Medical History:  Diagnosis Date  . Aortic atherosclerosis (HCC)   . Arthritis    "lower back; knees" (11/26/2016)  . Asthma   . Chronic atrial fibrillation (HCC)   . CKD (chronic kidney disease), stage III    patient unaware of this on 11/26/2016  . Complication of anesthesia    "had trouble waking me up in the late 1990's after gallbladder OR"  . GERD (gastroesophageal reflux disease)   . Heart murmur    "dx'd when I was a little kid"  . History of hiatal hernia   . Hyperlipidemia   . Hypertension   . Hypoglycemia    Past Surgical History:  Procedure Laterality Date  . CHOLECYSTECTOMY OPEN  1990s  . TONSILLECTOMY    . TUBAL LIGATION     Social History  Substance Use Topics  . Smoking status: Never Smoker  . Smokeless tobacco: Never Used  . Alcohol use Yes     Comment: 11/26/2016 "might have a drink on the holidays"   family history includes Atrial fibrillation in her brother; CAD in her mother; Colon cancer in her father; Healthy in her brother; Heart attack in her paternal grandmother; Heart failure in her maternal grandmother; Kidney failure in her maternal grandfather.  ROS: negative except as noted in the HPI  Medications: Current Outpatient Prescriptions  Medication Sig Dispense Refill  . AMBULATORY NON FORMULARY  MEDICATION Medication Name: allergy medication    . amLODipine (NORVASC) 5 MG tablet Take 5 mg by mouth daily.    Marland Kitchen CALCIUM PO Take 1 tablet by mouth at bedtime.    . furosemide (LASIX) 40 MG tablet TAKE 1 TABLET EVERY DAY 90 tablet 2  . Magnesium 200 MG TABS Take 1 tablet (200 mg total) by mouth daily. 60 each   . Multiple Vitamins-Minerals (MULTIVITAMIN PO) Take 1 tablet by mouth at bedtime.    Marland Kitchen omeprazole (PRILOSEC) 20 MG capsule TAKE 1 CAPSULE (20 MG TOTAL) BY MOUTH DAILY. 90 capsule 3  . Potassium Chloride ER 20 MEQ TBCR Take 20 mEq by mouth daily. 30 tablet 2  . simvastatin (ZOCOR) 20 MG tablet TAKE 1 TABLET (20 MG TOTAL) BY MOUTH EVERY EVENING. 90 tablet 4  . sotalol (BETAPACE) 80 MG tablet Take 1 tablet (80 mg total) by mouth every 12 (twelve) hours. 180 tablet 3   No current facility-administered medications for this visit.    Allergies  Allergen Reactions  . Tramadol Other (See Comments)    Night sweats, bad dreams  . Celecoxib Other (See Comments)  REACTION: hair loss  . Cetirizine Hcl Other (See Comments)    REACTION: fatigue  . Penicillins Other (See Comments)    Unknown  . Sulfonamide Derivatives Rash       Objective:  BP (!) 162/71   Pulse (!) 52   Temp 97.6 F (36.4 C)   Wt 168 lb (76.2 kg)   SpO2 99%   BMI 28.84 kg/m  Gen: well-groomed, cooperative, not ill-appearing, no distress HEENT: head normocephalic without obvious deformity, normal conjunctiva and cornea clear, wearing glasses, normal lips and tongue,  oropharynx clear, trachea midline Pulm: Normal work of breathing, normal phonation, clear to auscultation bilaterally, no wheezes, rales or rhonchi CV: Bradycardic rate, regular rhythm, s1 and s2 distinct, no murmurs, clicks or rubs  Neuro: alert and oriented x 3,  no tremor MSK: extremities atraumatic, normal gait and station Skin: warm, dry, intact; no rashes on exposed skin, no cyanosis Psych: good eye contact, euthymic mood, affect  mood-congruent, normal speech and thought content   No results found for this or any previous visit (from the past 72 hour(s)). No results found.    Assessment and Plan: 76 y.o. female with   1. Uncontrolled stage 2 hypertension BP Readings from Last 3 Encounters:  07/31/17 (!) 154/79  06/13/17 (!) 179/80  01/01/17 (!) 152/74  - d/c ACE and avoid ARBs due to suspected angioedema - BP not controlled with Amlodipine, Lasix and Enalapril. Starting Hyrdalazine. - hydrALAZINE (APRESOLINE) 25 MG tablet; Take 1 tablet (25 mg total) by mouth 3 (three) times daily.  Dispense: 90 tablet; Refill: 0  2. Angioedema due to angiotensin converting enzyme inhibitor (ACE-I) - symptoms highly concerning for angioedema. Patient asymptomatic on exam today. She did bring in photos which show significant peripheral edema of her hands bilaterally - discontinuing Enalapril and adding ACEi to allergy list - EPINEPHrine (EPIPEN 2-PAK) 0.3 mg/0.3 mL IJ SOAJ injection; Inject 0.3 mLs (0.3 mg total) into the muscle once as needed.  Dispense: 2 Device; Refill: 1 - fexofenadine (ALLEGRA) 180 MG tablet; Take 1 tablet (180 mg total) by mouth daily.  Dispense: 90 tablet; Refill: 3  3. Urticaria, chronic - Ambulatory referral to Allergy - fexofenadine (ALLEGRA) 180 MG tablet; Take 1 tablet (180 mg total) by mouth daily.  Dispense: 90 tablet; Refill: 3  4. Risk of exposure to Lyme disease - low suspicion for Lyme, especially given empiric treatment with doxycycline. Patient is requesting to be tested - B. burgdorfi antibodies  Patient education and anticipatory guidance given Patient agrees with treatment plan Follow-up as needed if symptoms worsen or fail to improve  Levonne Hubertharley E. Hermila Millis PA-C

## 2017-08-03 LAB — LYME AB/WESTERN BLOT REFLEX

## 2017-08-03 NOTE — Progress Notes (Signed)
Hi Zeynab,  Good news, your lyme antibodies are negative. You do not have lyme disease.  Best, Vinetta Bergamoharley

## 2017-08-12 ENCOUNTER — Ambulatory Visit (INDEPENDENT_AMBULATORY_CARE_PROVIDER_SITE_OTHER): Payer: Medicare HMO | Admitting: Family Medicine

## 2017-08-12 ENCOUNTER — Encounter: Payer: Self-pay | Admitting: Family Medicine

## 2017-08-12 VITALS — BP 136/68 | HR 56 | Wt 170.0 lb

## 2017-08-12 DIAGNOSIS — M7989 Other specified soft tissue disorders: Secondary | ICD-10-CM

## 2017-08-12 DIAGNOSIS — T783XXA Angioneurotic edema, initial encounter: Secondary | ICD-10-CM | POA: Diagnosis not present

## 2017-08-12 DIAGNOSIS — I481 Persistent atrial fibrillation: Secondary | ICD-10-CM | POA: Diagnosis not present

## 2017-08-12 DIAGNOSIS — I10 Essential (primary) hypertension: Secondary | ICD-10-CM | POA: Diagnosis not present

## 2017-08-12 DIAGNOSIS — T464X1A Poisoning by angiotensin-converting-enzyme inhibitors, accidental (unintentional), initial encounter: Secondary | ICD-10-CM

## 2017-08-12 DIAGNOSIS — I4819 Other persistent atrial fibrillation: Secondary | ICD-10-CM

## 2017-08-12 DIAGNOSIS — T464X5A Adverse effect of angiotensin-converting-enzyme inhibitors, initial encounter: Secondary | ICD-10-CM

## 2017-08-12 NOTE — Progress Notes (Signed)
Subjective:    Patient ID: Rebekah Harrell, female    DOB: [birth year], 76 y.o.   MRN: [id number]  HPI  She was recently seen by one of my partners for extremity swelling. One of her hands would swell and then a couple weeks later the other hand would swell and the foot. And eventually she started getting edema around the face and mouth. At that point she came in for evaluation and it was felt to be due to ACE inhibitor. It was discontinued and added to intolerance list. She has not had any of the  swelling in her lips/mouth since then. That she has still had some swelling of the extremities as well as large welts on the extremities. She says typically if she takes an antihistamine it resolves within about 24 hours. She does have an appointment with the allergist coming up in a couple of weeks and they have asked her to discontinue any antihistamines 5 days prior to her visit.  Hypertension- Pt denies chest pain, SOB, dizziness, or heart palpitations.  Taking meds as directed w/o problems.  Denies medication side effects.    Chronic urticaria-she was referred to allergist at last ofvisit. She has been schedule for early September.    Atrial fibrillation-she came off of her Eliquis about a month ago because she was experiencing bruising and occasional cuts on her arms that she could not get to stop bleeding. She stages healthy for a living. She did decide to switch to taking an aspirin daily. She did not consult [place] or her cardiologist. In fact looking back at her notes from when she saw Dr. Jens Som on January she was actually supposed to follow up in April. She also let me know since coming off of her lisinopril she has noticed a few more heart palpitations/flutters. She's had about 3 very brief episodes over the last week.    Review of Systems BP 136/68   Pulse (!) 56   Wt 170 lb (77.1 kg)   SpO2 100%   BMI 29.18 kg/m     Allergies  Allergen Reactions  . Ace Inhibitors Swelling  .  Tramadol Other (See Comments)    Night sweats, bad dreams  . Celecoxib Other (See Comments)    REACTION: hair loss  . Cetirizine Hcl Other (See Comments)    REACTION: fatigue  . Penicillins Other (See Comments)    Unknown  . Sulfonamide Derivatives Rash    Past Medical History:  Diagnosis Date  . Aortic atherosclerosis (HCC)   . Arthritis    "lower back; knees" (11/26/2016)  . Asthma   . Chronic atrial fibrillation (HCC)   . CKD (chronic kidney disease), stage III    patient unaware of this on 11/26/2016  . Complication of anesthesia    "had trouble waking me up in the late 1990's after gallbladder OR"  . GERD (gastroesophageal reflux disease)   . Heart murmur    "dx'd when I was a little kid"  . History of hiatal hernia   . Hyperlipidemia   . Hypertension   . Hypoglycemia     Past Surgical History:  Procedure Laterality Date  . CHOLECYSTECTOMY OPEN  1990s  . TONSILLECTOMY    . TUBAL LIGATION      Social History   Social History  . Marital status: Divorced    Spouse name: N/A  . Number of children: 3  . Years of education: N/A   Occupational History  . Not on file.  Social History Main Topics  . Smoking status: Never Smoker  . Smokeless tobacco: Never Used  . Alcohol use Yes     Comment: 11/26/2016 "might have a drink on the holidays"  . Drug use: No  . Sexual activity: No   Other Topics Concern  . Not on file   Social History Narrative  . No narrative on file    Family History  Problem Relation Age of Onset  . CAD Mother        HEART ATTACK  . Colon cancer Father        KIDNEY FAILURE/COLON CANCER  . Atrial fibrillation Brother   . Heart failure Maternal Grandmother   . Kidney failure Maternal Grandfather   . Heart attack Paternal Grandmother   . Healthy Brother     Outpatient Encounter Prescriptions as of 08/12/2017  Medication Sig  . AMBULATORY NON FORMULARY MEDICATION Medication Name: allergy medication  . amLODipine (NORVASC) 5 MG  tablet Take 5 mg by mouth daily.  Marland Kitchen CALCIUM PO Take 1 tablet by mouth at bedtime.  Marland Kitchen EPINEPHrine (EPIPEN 2-PAK) 0.3 mg/0.3 mL IJ SOAJ injection Inject 0.3 mLs (0.3 mg total) into the muscle once as needed.  . fexofenadine (ALLEGRA) 180 MG tablet Take 1 tablet (180 mg total) by mouth daily.  . furosemide (LASIX) 40 MG tablet TAKE 1 TABLET EVERY DAY  . hydrALAZINE (APRESOLINE) 25 MG tablet Take 1 tablet (25 mg total) by mouth 3 (three) times daily.  . Magnesium 200 MG TABS Take 1 tablet (200 mg total) by mouth daily.  . Multiple Vitamins-Minerals (MULTIVITAMIN PO) Take 1 tablet by mouth at bedtime.  Marland Kitchen omeprazole (PRILOSEC) 20 MG capsule TAKE 1 CAPSULE (20 MG TOTAL) BY MOUTH DAILY.  Marland Kitchen Potassium Chloride ER 20 MEQ TBCR Take 20 mEq by mouth daily.  . simvastatin (ZOCOR) 20 MG tablet TAKE 1 TABLET (20 MG TOTAL) BY MOUTH EVERY EVENING.  . sotalol (BETAPACE) 80 MG tablet Take 1 tablet (80 mg total) by mouth every 12 (twelve) hours.   No facility-administered encounter medications on file as of 08/12/2017.          Objective:   Physical Exam  Constitutional: She is oriented to person, place, and time. She appears well-developed and well-nourished.  HENT:  Head: Normocephalic and atraumatic.  Cardiovascular: Normal rate, regular rhythm and normal heart sounds.   Pulmonary/Chest: Effort normal and breath sounds normal.  Neurological: She is alert and oriented to person, place, and time.  Skin: Skin is warm and dry.  Psychiatric: She has a normal mood and affect. Her behavior is normal.          Assessment & Plan:  HTN - Well controlledWith the addition of hydralazine and removal of lisinopril. Continue current regimen. Follow up in  6 months.   parox Atrial fibrillation-her heart actually sounds like it's in sinus rhythm today and is rate controlled. Am very concerned though about her coming off of her Eliquis without consulting a provider. Though it seems like she has been in sinus rhythm  for most of the year in which case she may not need the addition of the Eliquis at this point in time. I would really like for her discuss this with cardiology.  Angioedema to ACE inhibitor-we'll also need to discuss with cardiology if they want to consider and are or to stay way from this completely. For now she is on hydralazine and has been doing well.

## 2017-08-18 DIAGNOSIS — Z8679 Personal history of other diseases of the circulatory system: Secondary | ICD-10-CM | POA: Diagnosis not present

## 2017-08-18 DIAGNOSIS — L508 Other urticaria: Secondary | ICD-10-CM | POA: Diagnosis not present

## 2017-08-18 DIAGNOSIS — T783XXA Angioneurotic edema, initial encounter: Secondary | ICD-10-CM | POA: Diagnosis not present

## 2017-08-27 ENCOUNTER — Other Ambulatory Visit: Payer: Self-pay | Admitting: Physician Assistant

## 2017-08-27 ENCOUNTER — Encounter: Payer: Self-pay | Admitting: Physician Assistant

## 2017-08-27 ENCOUNTER — Ambulatory Visit (INDEPENDENT_AMBULATORY_CARE_PROVIDER_SITE_OTHER): Payer: Medicare HMO | Admitting: Physician Assistant

## 2017-08-27 VITALS — BP 180/80 | HR 59 | Ht 64.0 in | Wt 171.6 lb

## 2017-08-27 DIAGNOSIS — E785 Hyperlipidemia, unspecified: Secondary | ICD-10-CM | POA: Diagnosis not present

## 2017-08-27 DIAGNOSIS — N183 Chronic kidney disease, stage 3 unspecified: Secondary | ICD-10-CM

## 2017-08-27 DIAGNOSIS — I5032 Chronic diastolic (congestive) heart failure: Secondary | ICD-10-CM | POA: Diagnosis not present

## 2017-08-27 DIAGNOSIS — I10 Essential (primary) hypertension: Secondary | ICD-10-CM

## 2017-08-27 DIAGNOSIS — I480 Paroxysmal atrial fibrillation: Secondary | ICD-10-CM | POA: Diagnosis not present

## 2017-08-27 MED ORDER — APIXABAN 5 MG PO TABS: 5.0000 mg | ORAL_TABLET | Freq: Two times a day (BID) | ORAL | 0 refills | Status: DC

## 2017-08-27 MED ORDER — HYDRALAZINE HCL 50 MG PO TABS: 50.0000 mg | ORAL_TABLET | Freq: Three times a day (TID) | ORAL | 0 refills | Status: DC

## 2017-08-27 MED ORDER — APIXABAN 5 MG PO TABS: 5.0000 mg | ORAL_TABLET | Freq: Two times a day (BID) | ORAL | 3 refills | Status: DC

## 2017-08-27 MED ORDER — HYDRALAZINE HCL 50 MG PO TABS: 50.0000 mg | ORAL_TABLET | Freq: Three times a day (TID) | ORAL | 5 refills | Status: DC

## 2017-08-27 MED ORDER — HYDRALAZINE HCL 50 MG PO TABS: 50.0000 mg | ORAL_TABLET | Freq: Three times a day (TID) | ORAL | 3 refills | Status: DC

## 2017-08-27 MED ORDER — APIXABAN 5 MG PO TABS: 5.0000 mg | ORAL_TABLET | Freq: Two times a day (BID) | ORAL | 5 refills | Status: DC

## 2017-08-27 NOTE — Patient Instructions (Addendum)
Medication Instructions:   INCREASE Hydralazine to 50mg  THREE TIMES DAILY  INCREASE Eliquis to 5mg  TWICE DAILY  No other changes to current medication regimen.  Prescriptions have been called to your pharmacy.  Labwork:   none  Testing/Procedures:  none  Follow-Up:  With Dr. Jens Somrenshaw in 3 months   If you need a refill on your cardiac medications before your next appointment, please call your pharmacy.

## 2017-08-27 NOTE — Progress Notes (Signed)
Cardiology Office Note    Date:  08/29/2017   ID:  Rebekah Harrell, DOB September 07, 1941, MRN [id number]  PCP:  Agapito Games, MD  Cardiologist:  Dr. Jens Som   Chief Complaint  Patient presents with  . Follow-up    seen for Dr. Jens Som, ACEI recently discontinued due to possible angioedema    History of Present Illness:  Rebekah Harrell is a 76 y.o. female with PMH of PAF, CKD stage III, GERD, HLD. She was diagnosed with new atrial fibrillation in June 2017. Stress echocardiogram in 2011 showed poor exercise tolerance but no ischemia. She was admitted in December 2017 was atrial fibrillation with RVR and acute diastolic heart failure. Echocardiogram in December 2017 showed normal LV function, elevated LV filling pressure, mild MR, mild biatrial enlargement. She was placed on sotalol and converted to sinus rhythm. His sotalol dose was decreased to 80 mg twice a day because of prolonged QT interval. Metoprolol was discontinued because of bradycardia. She was last seen by Dr. Jens Som on 01/01/2017. Her ACE inhibitor was recently discontinued due to swelling around the face and mouth.  She presents today for cardiology office visit. She has shown me pictures of recent facial swelling, it was quite impressive. Given the likely angioedema, I agree she should not be taking any ACE inhibitor or ARB. Her primary care provider has placed her on hydralazine 25 mg 3 times a day, her blood pressure is still elevated, I will increase it to 50 mg 3 times a day. She is also on amlodipine 5 mg daily. She is maintaining sinus rhythm on sotalol, I am unable to place her on any beta blocker or diltiazem due to history of significant bradycardia. On physical exam, she does not have any sign of volume overload, she does not have any lower extremity edema, orthopnea or PND.   Past Medical History:  Diagnosis Date  . Aortic atherosclerosis (HCC)   . Arthritis    "lower back; knees" (11/26/2016)  . Asthma     . Chronic atrial fibrillation (HCC)   . CKD (chronic kidney disease), stage III    patient unaware of this on 11/26/2016  . Complication of anesthesia    "had trouble waking me up in the late 1990's after gallbladder OR"  . GERD (gastroesophageal reflux disease)   . Heart murmur    "dx'd when I was a little kid"  . History of hiatal hernia   . Hyperlipidemia   . Hypertension   . Hypoglycemia     Past Surgical History:  Procedure Laterality Date  . CHOLECYSTECTOMY OPEN  1990s  . TONSILLECTOMY    . TUBAL LIGATION      Current Medications: Outpatient Medications Prior to Visit  Medication Sig Dispense Refill  . AMBULATORY NON FORMULARY MEDICATION Medication Name: allergy medication    . amLODipine (NORVASC) 5 MG tablet Take 5 mg by mouth daily.    Marland Kitchen CALCIUM PO Take 1 tablet by mouth at bedtime.    Marland Kitchen EPINEPHrine (EPIPEN 2-PAK) 0.3 mg/0.3 mL IJ SOAJ injection Inject 0.3 mLs (0.3 mg total) into the muscle once as needed. 2 Device 1  . fexofenadine (ALLEGRA) 180 MG tablet Take 1 tablet (180 mg total) by mouth daily. 90 tablet 3  . furosemide (LASIX) 40 MG tablet TAKE 1 TABLET EVERY DAY 90 tablet 2  . Magnesium 200 MG TABS Take 1 tablet (200 mg total) by mouth daily. 60 each   . Multiple Vitamins-Minerals (MULTIVITAMIN PO) Take 1 tablet  by mouth at bedtime.    Marland Kitchen. omeprazole (PRILOSEC) 20 MG capsule TAKE 1 CAPSULE (20 MG TOTAL) BY MOUTH DAILY. 90 capsule 3  . Potassium Chloride ER 20 MEQ TBCR Take 20 mEq by mouth daily. 30 tablet 2  . simvastatin (ZOCOR) 20 MG tablet TAKE 1 TABLET (20 MG TOTAL) BY MOUTH EVERY EVENING. 90 tablet 4  . sotalol (BETAPACE) 80 MG tablet Take 1 tablet (80 mg total) by mouth every 12 (twelve) hours. 180 tablet 3  . hydrALAZINE (APRESOLINE) 25 MG tablet Take 1 tablet (25 mg total) by mouth 3 (three) times daily. 90 tablet 0   No facility-administered medications prior to visit.      Allergies:   Ace inhibitors; Tramadol; Celecoxib; Cetirizine hcl; Penicillins;  and Sulfonamide derivatives   Social History   Social History  . Marital status: Divorced    Spouse name: N/A  . Number of children: 3  . Years of education: N/A   Social History Main Topics  . Smoking status: Never Smoker  . Smokeless tobacco: Never Used  . Alcohol use Yes     Comment: 11/26/2016 "might have a drink on the holidays"  . Drug use: No  . Sexual activity: No   Other Topics Concern  . None   Social History Narrative  . None     Family History:  The patient's family history includes Atrial fibrillation in her brother; CAD in her mother; Colon cancer in her father; Healthy in her brother; Heart attack in her paternal grandmother; Heart failure in her maternal grandmother; Kidney failure in her maternal grandfather.   ROS:   Please see the history of present illness.    ROS All other systems reviewed and are negative.   PHYSICAL EXAM:   VS:  BP (!) 180/80   Pulse (!) 59   Ht 5\' 4"  (1.626 m)   Wt 171 lb 9.6 oz (77.8 kg)   BMI 29.46 kg/m    GEN: Well nourished, well developed, in no acute distress  HEENT: normal  Neck: no JVD, carotid bruits, or masses Cardiac: RRR; no murmurs, rubs, or gallops,no edema  Respiratory:  clear to auscultation bilaterally, normal work of breathing GI: soft, nontender, nondistended, + BS MS: no deformity or atrophy  Skin: warm and dry, no rash Neuro:  Alert and Oriented x 3, Strength and sensation are intact Psych: euthymic mood, full affect  Wt Readings from Last 3 Encounters:  08/27/17 171 lb 9.6 oz (77.8 kg)  08/12/17 170 lb (77.1 kg)  07/31/17 168 lb (76.2 kg)      Studies/Labs Reviewed:   EKG:  EKG is ordered today.  The ekg ordered today demonstrates Normal sinus rhythm, no significant ST T wave changes  Recent Labs: 11/26/2016: TSH 4.817 11/27/2016: Hemoglobin 13.3; Platelets 212 01/15/2017: BUN 16; Creat 0.94; Magnesium 1.8; Potassium 4.1; Sodium 140   Lipid Panel    Component Value Date/Time   CHOL 136  06/19/2016 1136   TRIG 131 06/19/2016 1136   HDL 52 06/19/2016 1136   CHOLHDL 2.6 06/19/2016 1136   VLDL 26 06/19/2016 1136   LDLCALC 58 06/19/2016 1136    Additional studies/ records that were reviewed today include:    Echo 11/27/2016 LV EF: 50% -   55%  ------------------------------------------------------------------- Indications:      Atrial fibrillation - 427.31.  ------------------------------------------------------------------- History:   PMH:  CKD. Aortic atherosclerosis.  Risk factors: Hypertension. Dyslipidemia.  ------------------------------------------------------------------- Study Conclusions  - Left ventricle: The cavity size was normal.  There was moderate   concentric hypertrophy. Systolic function was normal. The   estimated ejection fraction was in the range of 50% to 55%. Wall   motion was normal; there were no regional wall motion   abnormalities. The study was not technically sufficient to allow   evaluation of LV diastolic dysfunction due to atrial   fibrillation. Doppler parameters are consistent with high   ventricular filling pressure. - Mitral valve: Calcified annulus. There was mild regurgitation. - Left atrium: The atrium was mildly dilated. - Right ventricle: Systolic function was mildly reduced. - Right atrium: The atrium was mildly dilated. - Atrial septum: There was increased thickness of the septum,   consistent with lipomatous hypertrophy. - Pulmonic valve: There was trivial regurgitation.   ASSESSMENT:    1. Paroxysmal atrial fibrillation (HCC)   2. Uncontrolled stage 2 hypertension   3. CKD (chronic kidney disease), stage III   4. Hyperlipidemia, unspecified hyperlipidemia type   5. Chronic diastolic heart failure (HCC)      PLAN:  In order of problems listed above:  1. Paroxysmal atrial fibrillation: well controlled on sotalol and eliquis. CHA2DS2-Vasc score 4 (age, HTN, HF). She self discontinued her eliquis a month  ago due to bruising, however she is also taking a lot of NSAIDs, I recommended her to restart her eliquis but hold off on NSAIDs.   2. Hypertension: Recently had significant facial and jaw swelling concerning for angioedema, ACE inhibitor has been discontinued. She should not be taking ARB either. Her primary care provider has added hydralazine 25 mg 3 times a day, I will increase the dose to 50 mg 3 times a day.  3. Hyperlipidemia: on Zocor 20 mg daily, cholesterol well controlled based on lab work in June 2017, she is due for another fasting lipid panel. Will defer to PCP.  4. Chronic diastolic heart failure: Euvolemic on physical exam.    Medication Adjustments/Labs and Tests Ordered: Current medicines are reviewed at length with the patient today.  Concerns regarding medicines are outlined above.  Medication changes, Labs and Tests ordered today are listed in the Patient Instructions below. Patient Instructions  Medication Instructions:   INCREASE Hydralazine to  THREE TIMES DAILY  INCREASE Eliquis to  TWICE DAILY  No other changes to current medication regimen.  Prescriptions have been called to your pharmacy.  Labwork:   none  Testing/Procedures:  none  Follow-Up:  With Dr. Jens Som in 3 months   If you need a refill on your cardiac medications before your next appointment, please call your pharmacy.      Ramond Dial, [place]  08/29/2017 2:17 PM    [facility] [address] [place], [place], [place]  [address] Phone: [phone]; Fax: [phone]

## 2017-08-28 NOTE — Telephone Encounter (Signed)
Please review for refill, Thanks !  

## 2017-08-29 ENCOUNTER — Encounter: Payer: Self-pay | Admitting: Physician Assistant

## 2017-08-31 ENCOUNTER — Telehealth: Payer: Self-pay | Admitting: Family Medicine

## 2017-08-31 DIAGNOSIS — I10 Essential (primary) hypertension: Secondary | ICD-10-CM

## 2017-08-31 NOTE — Telephone Encounter (Signed)
Please call patient: Received cardiology note. She is due for CMP and lipid panel. He wanted her to have this done through our office so lab order has been placed and she can go down to our lab at her convenience.

## 2017-09-01 NOTE — Telephone Encounter (Signed)
Left VM advising Pt, callback information provided.

## 2017-10-14 ENCOUNTER — Other Ambulatory Visit: Payer: Self-pay | Admitting: Family Medicine

## 2017-10-28 ENCOUNTER — Other Ambulatory Visit: Payer: Self-pay | Admitting: Cardiology

## 2017-10-28 NOTE — Telephone Encounter (Signed)
REFILL 

## 2017-11-06 ENCOUNTER — Encounter: Payer: Self-pay | Admitting: Cardiology

## 2017-11-09 NOTE — Progress Notes (Signed)
HPI: FU atrial fibrillation. Patient with new diagnosis of atrial fibrillation in June 2017. Stress echocardiogram 2011 showed poor exercise tolerance but no ischemia. Laboratories June 2017 showed TSH 2.49. Admitted December 2017 with atrial fibrillation with rapid ventricular response and acute diastolic congestive heart failure. Echo December 2017 showed normal LV systolic function, elevated left ventricular filling pressures, mild mitral regurgitation and mild biatrial enlargement. Patient was placed on sotalol and converted to sinus rhythm. Sotalol dose was decreased to 80 mg twice a day because of prolonged QT interval. Metoprolol was discontinued because of bradycardia. Developed angioedema with lisinopril. Since last seen, she has noted some dyspnea since having a viral illness but denies orthopnea, PND pedal edema, chest pain or syncope.  Current Outpatient Medications  Medication Sig Dispense Refill  . AMBULATORY NON FORMULARY MEDICATION Medication Name: allergy medication    . amLODipine (NORVASC) 5 MG tablet TAKE 1 TABLET (5 MG TOTAL) BY MOUTH DAILY. 90 tablet 1  . apixaban (ELIQUIS) 5 MG TABS tablet Take 1 tablet (5 mg total) by mouth 2 (two) times daily. 180 tablet 3  . CALCIUM PO Take 1 tablet by mouth at bedtime.    . cetirizine (ZYRTEC) 10 MG tablet     . Cholecalciferol (D-3-5) 5000 units capsule Take 5,000 Units by mouth daily.    . furosemide (LASIX) 40 MG tablet TAKE 1 TABLET EVERY DAY 90 tablet 2  . hydrALAZINE (APRESOLINE) 50 MG tablet Take 1 tablet (50 mg total) by mouth 3 (three) times daily. 270 tablet 3  . Magnesium 200 MG TABS Take 1 tablet (200 mg total) by mouth daily. 60 each   . Multiple Vitamins-Minerals (MULTIVITAMIN PO) Take 1 tablet by mouth at bedtime.    Marland Kitchen. omeprazole (PRILOSEC) 20 MG capsule TAKE 1 CAPSULE (20 MG TOTAL) BY MOUTH DAILY. 90 capsule 3  . Potassium Chloride ER 20 MEQ TBCR Take 20 mEq by mouth daily. 30 tablet 2  . simvastatin (ZOCOR) 20 MG  tablet TAKE 1 TABLET  EVERY EVENING. 90 tablet 3  . sotalol (BETAPACE) 80 MG tablet TAKE 1 TABLET EVERY 12 HOURS 180 tablet 1   No current facility-administered medications for this visit.      Past Medical History:  Diagnosis Date  . Aortic atherosclerosis (HCC)   . Arthritis    "lower back; knees" (11/26/2016)  . Asthma   . Chronic atrial fibrillation (HCC)   . CKD (chronic kidney disease), stage III [facility](HCC)    patient unaware of this on 11/26/2016  . Complication of anesthesia    "had trouble waking me up in the late 1990's after gallbladder OR"  . GERD (gastroesophageal reflux disease)   . Heart murmur    "dx'd when I was a little kid"  . History of hiatal hernia   . Hyperlipidemia   . Hypertension   . Hypoglycemia     Past Surgical History:  Procedure Laterality Date  . CHOLECYSTECTOMY OPEN  1990s  . TONSILLECTOMY    . TUBAL LIGATION      Social History   Socioeconomic History  . Marital status: Divorced    Spouse name: Not on file  . Number of children: 3  . Years of education: Not on file  . Highest education level: Not on file  Social Needs  . Financial resource strain: Not on file  . Food insecurity - worry: Not on file  . Food insecurity - inability: Not on file  . Transportation needs - medical: Not  on file  . Transportation needs - non-medical: Not on file  Occupational History  . Not on file  Tobacco Use  . Smoking status: Never Smoker  . Smokeless tobacco: Never Used  Substance and Sexual Activity  . Alcohol use: Yes    Comment: 11/26/2016 "might have a drink on the holidays"  . Drug use: No  . Sexual activity: No  Other Topics Concern  . Not on file  Social History Narrative  . Not on file    Family History  Problem Relation Age of Onset  . CAD Mother        HEART ATTACK  . Colon cancer Father        KIDNEY FAILURE/COLON CANCER  . Atrial fibrillation Brother   . Heart failure Maternal Grandmother   . Kidney failure Maternal Grandfather    . Heart attack Paternal Grandmother   . Healthy Brother     ROS:  Recent diarrhea/nausea and vomiting from viral illness but no productive cough, hemoptysis, dysphasia, odynophagia, melena, hematochezia, dysuria, hematuria, rash, seizure activity, orthopnea, PND, pedal edema, claudication. Remaining systems are negative.  Physical Exam: Well-developed well-nourished in no acute distress.  Skin is warm and dry.  HEENT is normal.  Neck is supple.  Chest is clear to auscultation with normal expansion.  Cardiovascular exam is regular rate and rhythm.  Abdominal exam nontender or distended. No masses palpated. Extremities show no edema. neuro grossly intact  ECG- sinus bradycardia at a rate of 57.personally reviewed  A/P  1 paroxysmal atrial fibrillation-patient remains in sinus rhythm today. We will continue with sotalol for rhythm control. Dose was previously reduced because of QT prolongation. Continue apixaban. Check renal function and hgb.  2 hypertension-blood pressure is elevated. However she states typically controlled. Continue present medications and follow.  3 hyperlipidemia-continue statin. Check liver functions and lipids.  4 chronic diastolic congestive heart failure-patient appears to be euvolemic on examination today. Continue present dose of diuretic. Check potassium and renal function.  Olga MillersBrian Sira Adsit, MD

## 2017-11-13 ENCOUNTER — Other Ambulatory Visit: Payer: Self-pay | Admitting: Internal Medicine

## 2017-11-16 NOTE — Telephone Encounter (Signed)
REFILL 

## 2017-11-16 NOTE — Telephone Encounter (Signed)
This is Dr. Crenshaw's pt. Please address °

## 2017-11-18 ENCOUNTER — Encounter: Payer: Self-pay | Admitting: Cardiology

## 2017-11-18 ENCOUNTER — Ambulatory Visit (INDEPENDENT_AMBULATORY_CARE_PROVIDER_SITE_OTHER): Payer: Medicare HMO | Admitting: Cardiology

## 2017-11-18 VITALS — BP 160/80 | HR 57 | Ht 64.0 in | Wt 173.1 lb

## 2017-11-18 DIAGNOSIS — I480 Paroxysmal atrial fibrillation: Secondary | ICD-10-CM | POA: Diagnosis not present

## 2017-11-18 DIAGNOSIS — E7800 Pure hypercholesterolemia, unspecified: Secondary | ICD-10-CM | POA: Diagnosis not present

## 2017-11-18 DIAGNOSIS — I5032 Chronic diastolic (congestive) heart failure: Secondary | ICD-10-CM | POA: Diagnosis not present

## 2017-11-18 DIAGNOSIS — I10 Essential (primary) hypertension: Secondary | ICD-10-CM | POA: Diagnosis not present

## 2017-11-18 NOTE — Patient Instructions (Signed)
Medication Instructions:   NO CHANGE  Labwork:  Your physician recommends that you HAVE LAB WORK TODAY  Follow-Up:  Your physician wants you to follow-up in: ONE YEAR WITH DR CRENSHAW You will receive a reminder letter in the mail two months in advance. If you don't receive a letter, please call our office to schedule the follow-up appointment.   If you need a refill on your cardiac medications before your next appointment, please call your pharmacy.    

## 2017-11-19 LAB — BASIC METABOLIC PANEL
BUN/Creatinine Ratio: 15 (ref 12–28)
BUN: 14 mg/dL (ref 8–27)
CO2: 23 mmol/L (ref 20–29)
CREATININE: 0.95 mg/dL (ref 0.57–1.00)
Calcium: 9.2 mg/dL (ref 8.7–10.3)
Chloride: 107 mmol/L — ABNORMAL HIGH (ref 96–106)
GFR calc Af Amer: 67 mL/min/{1.73_m2} (ref >59)
GFR, EST NON AFRICAN AMERICAN: 58 mL/min/{1.73_m2} — AB (ref >59)
GLUCOSE: 90 mg/dL (ref 65–99)
Potassium: 5 mmol/L (ref 3.5–5.2)
Sodium: 143 mmol/L (ref 134–144)

## 2017-11-19 LAB — CBC
HEMATOCRIT: 38.2 % (ref 34.0–46.6)
Hemoglobin: 12.9 g/dL (ref 11.1–15.9)
MCH: 28.5 pg (ref 26.6–33.0)
MCHC: 33.8 g/dL (ref 31.5–35.7)
MCV: 84 fL (ref 79–97)
PLATELETS: 306 10*3/uL (ref 150–379)
RBC: 4.53 x10E6/uL (ref 3.77–5.28)
RDW: 14.2 % (ref 12.3–15.4)
WBC: 7.6 10*3/uL (ref 3.4–10.8)

## 2017-11-19 LAB — HEPATIC FUNCTION PANEL
ALT: 13 IU/L (ref 0–32)
AST: 20 IU/L (ref 0–40)
Albumin: 4.3 g/dL (ref 3.5–4.8)
Alkaline Phosphatase: 64 IU/L (ref 39–117)
Bilirubin Total: 0.3 mg/dL (ref 0.0–1.2)
Bilirubin, Direct: 0.1 mg/dL (ref 0.00–0.40)
Total Protein: 7 g/dL (ref 6.0–8.5)

## 2017-11-19 LAB — LIPID PANEL
CHOL/HDL RATIO: 3.9 ratio (ref 0.0–4.4)
CHOLESTEROL TOTAL: 148 mg/dL (ref 100–199)
HDL: 38 mg/dL — ABNORMAL LOW (ref >39)
LDL CALC: 65 mg/dL (ref 0–99)
TRIGLYCERIDES: 226 mg/dL — AB (ref 0–149)
VLDL CHOLESTEROL CAL: 45 mg/dL — AB (ref 5–40)

## 2017-12-23 DIAGNOSIS — L508 Other urticaria: Secondary | ICD-10-CM | POA: Diagnosis not present

## 2017-12-23 DIAGNOSIS — Z8679 Personal history of other diseases of the circulatory system: Secondary | ICD-10-CM | POA: Diagnosis not present

## 2017-12-23 DIAGNOSIS — T783XXA Angioneurotic edema, initial encounter: Secondary | ICD-10-CM | POA: Diagnosis not present

## 2017-12-23 DIAGNOSIS — L299 Pruritus, unspecified: Secondary | ICD-10-CM | POA: Diagnosis not present

## 2017-12-23 DIAGNOSIS — L659 Nonscarring hair loss, unspecified: Secondary | ICD-10-CM | POA: Diagnosis not present

## 2018-02-26 ENCOUNTER — Other Ambulatory Visit: Payer: Self-pay | Admitting: Family Medicine

## 2018-02-26 ENCOUNTER — Ambulatory Visit (INDEPENDENT_AMBULATORY_CARE_PROVIDER_SITE_OTHER): Payer: Medicare HMO

## 2018-02-26 ENCOUNTER — Encounter: Payer: Self-pay | Admitting: Family Medicine

## 2018-02-26 ENCOUNTER — Ambulatory Visit (INDEPENDENT_AMBULATORY_CARE_PROVIDER_SITE_OTHER): Payer: Medicare HMO | Admitting: Family Medicine

## 2018-02-26 VITALS — BP 137/53 | HR 55 | Ht 64.0 in | Wt 175.0 lb

## 2018-02-26 DIAGNOSIS — L659 Nonscarring hair loss, unspecified: Secondary | ICD-10-CM | POA: Diagnosis not present

## 2018-02-26 DIAGNOSIS — L821 Other seborrheic keratosis: Secondary | ICD-10-CM

## 2018-02-26 DIAGNOSIS — Z1231 Encounter for screening mammogram for malignant neoplasm of breast: Secondary | ICD-10-CM | POA: Diagnosis not present

## 2018-02-26 NOTE — Progress Notes (Signed)
Subjective:    Patient ID: Rebekah Harrell, female    DOB: Dec 05, 1941, 77 y.o.   MRN: [id number]  HPI 56 your female comes in today to discuss diffuse hair loss that she started noticing around 6-8 months ago.  She does have a family history in her maternal great grandmother who had significant hair thinning.  At first she thought it could be 1 of the new medications she had started which were hydralazine and cetirizine.  She actually called Dr. Lacretia Nicks her allergist and asked but she was told that it was not likely to be either of these medications and to come in for evaluation with her PCP.  She denies any circular bald spots.  She denies any significant irritation or inflammation of the scalp.  She does report very dry skin and very dry hair.  She denies any abnormal fevers or rashes on her skin.  She has been using CBD oil for a couple of months and says it has been helpful for her mood as well as her low back pain.  She feels like her hair is thinning a little bit all over but mostly on the crown of her head.  She is noted she does not have to use many rollers to fix her hair.  She is not currently using any over-the-counter topicals treatments or supplements for this.  She denies any new color or chemicals on her hair.  She uses Ericka Pontiff shampoo.  She has not tried any vitamins or regrowth serums.  She says when her dog jumped up on her that she will have to hold her hair because otherwise it will literally pull her hair out.   He also has a few skin lesions on her face that she would like me to look at today.  She says they are not bothersome and she typically tries to just cover them up with her makeup.  Review of Systems  BP (!) 137/53   Pulse (!) 55   Ht 5\' 4"  (1.626 m)   Wt 175 lb (79.4 kg)   SpO2 99%   BMI 30.04 kg/m     Allergies  Allergen Reactions  . Ace Inhibitors Swelling  . Tramadol Other (See Comments)    Night sweats, bad dreams  . Celecoxib Other (See  Comments)    REACTION: hair loss  . Cetirizine Hcl Other (See Comments)    REACTION: fatigue  . Penicillins Other (See Comments)    Unknown  . Sulfonamide Derivatives Rash    Past Medical History:  Diagnosis Date  . Aortic atherosclerosis (HCC)   . Arthritis    "lower back; knees" (11/26/2016)  . Asthma   . Chronic atrial fibrillation (HCC)   . CKD (chronic kidney disease), stage III [facility])    patient unaware of this on 11/26/2016  . Complication of anesthesia    "had trouble waking me up in the late 1990's after gallbladder OR"  . GERD (gastroesophageal reflux disease)   . Heart murmur    "dx'd when I was a little kid"  . History of hiatal hernia   . Hyperlipidemia   . Hypertension   . Hypoglycemia     Past Surgical History:  Procedure Laterality Date  . CHOLECYSTECTOMY OPEN  1990s  . TONSILLECTOMY    . TUBAL LIGATION      Social History   Socioeconomic History  . Marital status: Divorced    Spouse name: Not on file  . Number of children: 3  .  Years of education: Not on file  . Highest education level: Not on file  Social Needs  . Financial resource strain: Not on file  . Food insecurity - worry: Not on file  . Food insecurity - inability: Not on file  . Transportation needs - medical: Not on file  . Transportation needs - non-medical: Not on file  Occupational History  . Not on file  Tobacco Use  . Smoking status: Never Smoker  . Smokeless tobacco: Never Used  Substance and Sexual Activity  . Alcohol use: Yes    Comment: 11/26/2016 "might have a drink on the holidays"  . Drug use: No  . Sexual activity: No  Other Topics Concern  . Not on file  Social History Narrative  . Not on file    Family History  Problem Relation Age of Onset  . CAD Mother        HEART ATTACK  . Colon cancer Father        KIDNEY FAILURE/COLON CANCER  . Atrial fibrillation Brother   . Heart failure Maternal Grandmother   . Kidney failure Maternal Grandfather   . Heart  attack Paternal Grandmother   . Healthy Brother     Outpatient Encounter Medications as of 02/26/2018  Medication Sig  . AMBULATORY NON FORMULARY MEDICATION Medication Name: allergy medication  . amLODipine (NORVASC) 5 MG tablet TAKE 1 TABLET (5 MG TOTAL) BY MOUTH DAILY.  Marland Kitchen. CALCIUM PO Take 1 tablet by mouth at bedtime.  . cetirizine (ZYRTEC) 10 MG tablet   . Cholecalciferol (D-3-5) 5000 units capsule Take 5,000 Units by mouth daily.  . furosemide (LASIX) 40 MG tablet TAKE 1 TABLET EVERY DAY  . hydrALAZINE (APRESOLINE) 50 MG tablet Take 1 tablet (50 mg total) by mouth 3 (three) times daily.  . Magnesium 200 MG TABS Take 1 tablet (200 mg total) by mouth daily.  . Multiple Vitamins-Minerals (MULTIVITAMIN PO) Take 1 tablet by mouth at bedtime.  . Potassium Chloride ER 20 MEQ TBCR Take 20 mEq by mouth daily.  . ranitidine (ZANTAC) 150 MG tablet Take 150 mg by mouth daily.  . simvastatin (ZOCOR) 20 MG tablet TAKE 1 TABLET  EVERY EVENING.  . sotalol (BETAPACE) 80 MG tablet TAKE 1 TABLET EVERY 12 HOURS  . [DISCONTINUED] apixaban (ELIQUIS) 5 MG TABS tablet Take 1 tablet (5 mg total) by mouth 2 (two) times daily.  . [DISCONTINUED] omeprazole (PRILOSEC) 20 MG capsule TAKE 1 CAPSULE (20 MG TOTAL) BY MOUTH DAILY.   No facility-administered encounter medications on file as of 02/26/2018.          Objective:   Physical Exam  Constitutional: She is oriented to person, place, and time. She appears well-developed and well-nourished.  HENT:  Head: Normocephalic and atraumatic.  Eyes: Conjunctivae and EOM are normal.  Cardiovascular: Normal rate.  Pulmonary/Chest: Effort normal.  Neurological: She is alert and oriented to person, place, and time.  Skin: Skin is dry. No pallor.  Her scalp looks great.  No erythema scaling excoriations or lesions.  It looks healthy.  Her hair in general appears to be thin.  I do not see any scarring and loss of follicles but it is definitely more thin on the top of the  head than at the base.  No small exclamation point hairs.  She does have a little  vellum at the temples.  Also has a large seborrheic keratosis on the left facial cheek and a smaller one just under her right eye.  And  she has a couple particularly over the right forehead near her temple.  Psychiatric: She has a normal mood and affect. Her behavior is normal.  Vitals reviewed.       Assessment & Plan:  Hair loss-alopecia-will evaluate for deficiency and thyroid disorder.  Will call with results once available.  Also check RPR.  If all normal then consider a trial of topical minoxidil for women but explained that it only works while using it.  We could also consider referral to a dermatologist who could also discuss further therapeutic options.  Seborrheic keratoses -just diagnosed and discussed the benign nature of these lesions.  They are hereditary.  They have been getting a little larger and would like to have them removed.  She can schedule an appointment when she can come in without her makeup on and I will be happy to freeze them.

## 2018-03-01 LAB — CBC
HCT: 41.3 % (ref 35.0–45.0)
Hemoglobin: 14 g/dL (ref 11.7–15.5)
MCH: 28.7 pg (ref 27.0–33.0)
MCHC: 33.9 g/dL (ref 32.0–36.0)
MCV: 84.6 fL (ref 80.0–100.0)
MPV: 10.5 fL (ref 7.5–12.5)
PLATELETS: 240 10*3/uL (ref 140–400)
RBC: 4.88 10*6/uL (ref 3.80–5.10)
RDW: 13.6 % (ref 11.0–15.0)
WBC: 7.6 10*3/uL (ref 3.8–10.8)

## 2018-03-01 LAB — IRON,TIBC AND FERRITIN PANEL
%SAT: 23 % (calc) (ref 11–50)
Ferritin: 109 ng/mL (ref 20–288)
IRON: 72 ug/dL (ref 45–160)
TIBC: 307 mcg/dL (calc) (ref 250–450)

## 2018-03-01 LAB — RPR: RPR: NONREACTIVE

## 2018-03-01 LAB — TSH: TSH: 3.01 m[IU]/L (ref 0.40–4.50)

## 2018-03-15 ENCOUNTER — Other Ambulatory Visit: Payer: Self-pay | Admitting: Cardiology

## 2018-04-22 ENCOUNTER — Other Ambulatory Visit: Payer: Self-pay | Admitting: Cardiology

## 2018-04-22 NOTE — Telephone Encounter (Signed)
REFILL 

## 2018-04-23 ENCOUNTER — Encounter: Payer: Self-pay | Admitting: Family Medicine

## 2018-04-23 ENCOUNTER — Ambulatory Visit (INDEPENDENT_AMBULATORY_CARE_PROVIDER_SITE_OTHER): Payer: Medicare HMO | Admitting: Family Medicine

## 2018-04-23 VITALS — BP 139/68 | HR 59 | Ht 64.0 in | Wt 177.0 lb

## 2018-04-23 DIAGNOSIS — L821 Other seborrheic keratosis: Secondary | ICD-10-CM | POA: Diagnosis not present

## 2018-04-23 NOTE — Progress Notes (Signed)
   Subjective:    Patient ID: Rebekah Harrell, female    DOB: 04/05/1941, 77 y.o.   MRN: [id number]  HPI 77 yo here for treatment for seborrheic keratoses with cyrotherapy. She has one under her left axilla, two on the left facial cheek and one on the right facial check and 2 on right side of forehead, and 3 on left side. She says the 2 lesion on her side have been frozen before but returned.     Review of Systems     Objective:   Physical Exam  Constitutional: She is oriented to person, place, and time. She appears well-developed and well-nourished.  HENT:  Head: Normocephalic and atraumatic.  Eyes: Conjunctivae and EOM are normal.  Cardiovascular: Normal rate.  Pulmonary/Chest: Effort normal.  Neurological: She is alert and oriented to person, place, and time.  Skin: Skin is dry. No pallor.  She has one under her left axilla, two on the left facial cheek and one on the right facial check and 2 on right side of forehead, and 3 on left side. Marland Kitchen     Psychiatric: She has a normal mood and affect. Her behavior is normal.  Vitals reviewed.         Assessment & Plan:  Seborrheic keratoses - cryotherapy performed. She tolerated well.   Cryotherapy Procedure Note  Pre-operative Diagnosis: seb keratoses   Post-operative Diagnosis: same   Locations: one under her left axilla, two on the left facial cheek and one on the right facial check and 2 on right side of forehead, and 3 on left side. .    Indications: irritation, closes rub on the ones on her side and axilla  Anesthesia: not required   Procedure Details  Patient informed of risks (permanent scarring, infection, light or dark discoloration, bleeding, infection, weakness, numbness and recurrence of the lesion) and benefits of the procedure and verbal informed consent obtained.  The areas are treated with liquid nitrogen therapy, frozen until ice ball extended 2 mm beyond lesion, allowed to thaw, and treated again. The  patient tolerated procedure well.  The patient was instructed on post-op care, warned that there may be blister formation, redness and pain. Recommend OTC analgesia as needed for pain.  Condition: Stable  Complications: none.  Plan: 1. Instructed to keep the area dry and covered for 24-48h and clean thereafter. 2. Warning signs of infection were reviewed.   3. Recommended that the patient use OTC acetaminophen as needed for pain.  4. Return PRN.

## 2018-04-23 NOTE — Patient Instructions (Signed)
Try generic brand of Allegra  once a day.

## 2018-05-21 ENCOUNTER — Other Ambulatory Visit: Payer: Self-pay | Admitting: Family Medicine

## 2018-07-26 ENCOUNTER — Other Ambulatory Visit: Payer: Self-pay | Admitting: Cardiology

## 2018-08-02 ENCOUNTER — Other Ambulatory Visit: Payer: Self-pay | Admitting: Family Medicine

## 2018-12-20 ENCOUNTER — Encounter: Payer: Self-pay | Admitting: Family Medicine

## 2018-12-20 ENCOUNTER — Other Ambulatory Visit: Payer: Self-pay | Admitting: Cardiology

## 2018-12-20 ENCOUNTER — Ambulatory Visit (INDEPENDENT_AMBULATORY_CARE_PROVIDER_SITE_OTHER): Payer: Medicare HMO | Admitting: Family Medicine

## 2018-12-20 ENCOUNTER — Other Ambulatory Visit: Payer: Self-pay | Admitting: Physician Assistant

## 2018-12-20 VITALS — BP 129/54 | HR 58 | Wt 178.0 lb

## 2018-12-20 DIAGNOSIS — S39012A Strain of muscle, fascia and tendon of lower back, initial encounter: Secondary | ICD-10-CM

## 2018-12-20 DIAGNOSIS — I10 Essential (primary) hypertension: Secondary | ICD-10-CM

## 2018-12-20 MED ORDER — TRAMADOL HCL 50 MG PO TABS: 50.0000 mg | ORAL_TABLET | Freq: Three times a day (TID) | ORAL | 0 refills | Status: DC | PRN

## 2018-12-20 NOTE — Progress Notes (Signed)
Rebekah MylarMarcia L Harrell is a 77 y.o. female who [facility] today for low back pain.  Rebekah JunglingMarcia developed left low back pain starting about 10 days ago.  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  She denies any pain radiating down her legs weakness or numbness.  Pain is worse with activity and better with rest.  She is tried Tylenol and heating pad which helped a bit.  She has a history of back pain in the past with x-rays in 2015 showing degenerative changes in her thoracic and lumbar spine.  No bowel or bladder dysfunction fevers or chills nausea vomiting or diarrhea.    ROS:  As above  Exam:  BP (!) 129/54   Pulse (!) 58   Wt 178 lb (80.7 kg)   BMI 30.55 kg/m  General: Well Developed, well nourished, and in no acute distress.  Neuro/Psych: Alert and oriented x3, extra-ocular muscles intact, able to move all 4 extremities, sensation grossly intact. Skin: Warm and dry, no rashes noted.  Respiratory: Not using accessory muscles, speaking in full sentences, trachea midline.  Cardiovascular: Pulses palpable, no extremity edema. Abdomen: Does not appear distended. MSK: L-spine: Nontender to spinal midline.  Tender palpation lumbar paraspinal musculature on the left. Lumbar motion reduced in extension right rotation and lateral flexion due to pain. Normal otherwise. Lower extremity strength reflexes and sensation are equal normal throughout. Mild antalgic gait. T-spine nontender slight thoracic kyphosis present.    Lab and Radiology Results  EXAM LUMBAR SPINE - COMPLETE 4+ VIEW  COMPARISON None.  FINDINGS The lumbar vertebrae are in normal alignment. For age the intervertebral disc spaces appear normal. No compression deformity is seen. The SI joints are unremarkable. There is degenerative change involving the facet joints of L4-5 and  L5-S1.  IMPRESSION Normal alignment with no acute abnormality. Degenerative change involves the facet joints of the lower lumbar spine.  SIGNATURE  Electronically Signed   By: Dwyane DeePaul  Barry M.D.   On: 03/01/2014 14:09  I personally (independently) visualized and performed the interpretation of the images attached in this note.    Assessment and Plan: 77 y.o. female with  Left lumbosacral strain.  Possible irritation of quadratus lumborum muscle.  Plan for physical therapy, heating pad, home exercise program.  Limited tramadol for pain control.  Recheck in about 4 weeks or sooner if needed.  PDMP reviewed during this encounter.    Orders Placed This Encounter  Procedures  . Ambulatory referral to Physical Therapy    Referral Priority:   Routine    Referral Type:   Physical Medicine    Referral Reason:   Specialty Services Required    Requested Specialty:   Physical Therapy   Meds ordered this encounter  Medications  . traMADol (ULTRAM) 50 MG tablet    Sig: Take 1 tablet (50 mg total) by mouth every 8 (eight) hours as needed.    Dispense:  15 tablet    Refill:  0    Historical information moved to improve visibility of documentation.  Past Medical History:  Diagnosis Date  . Aortic atherosclerosis (HCC)   . Arthritis    "lower back; knees" (11/26/2016)  . Asthma   . Chronic atrial fibrillation   . CKD (chronic kidney disease), stage III [facility](HCC)    patient unaware of this on 11/26/2016  . Complication of anesthesia    "had trouble waking  me up in the late 1990's after gallbladder OR"  . GERD (gastroesophageal reflux disease)   . Heart murmur    "dx'd when I was a little kid"  . History of hiatal hernia   . Hyperlipidemia   . Hypertension   . Hypoglycemia    Past Surgical History:  Procedure Laterality Date  . CHOLECYSTECTOMY OPEN  1990s  . TONSILLECTOMY    . TUBAL LIGATION     Social History   Tobacco Use  . Smoking status: Never Smoker  . Smokeless  tobacco: Never Used  Substance Use Topics  . Alcohol use: Yes    Comment: 11/26/2016 "might have a drink on the holidays"   family history includes Atrial fibrillation in her brother; CAD in her mother; Colon cancer in her father; Healthy in her brother; Heart attack in her paternal grandmother; Heart failure in her maternal grandmother; Kidney failure in her maternal grandfather.  Medications: Current Outpatient Medications  Medication Sig Dispense Refill  . AMBULATORY NON FORMULARY MEDICATION Medication Name: allergy medication    . amLODipine (NORVASC) 5 MG tablet TAKE 1 TABLET EVERY DAY 90 tablet 2  . CALCIUM PO Take 1 tablet by mouth at bedtime.    . Cholecalciferol (D-3-5) 5000 units capsule Take 5,000 Units by mouth daily.    . furosemide (LASIX) 40 MG tablet TAKE 1 TABLET EVERY DAY 90 tablet 2  . hydrALAZINE (APRESOLINE) 50 MG tablet Take 1 tablet (50 mg total) by mouth 3 (three) times daily. 270 tablet 3  . Magnesium 200 MG TABS Take 1 tablet (200 mg total) by mouth daily. 60 each   . Multiple Vitamins-Minerals (MULTIVITAMIN PO) Take 1 tablet by mouth at bedtime.    Marland Kitchen. omeprazole (PRILOSEC) 20 MG capsule TAKE 1 CAPSULE EVERY DAY 90 capsule 3  . Potassium Chloride ER 20 MEQ TBCR Take 20 mEq by mouth daily. 30 tablet 2  . potassium chloride SA (K-DUR,KLOR-CON) 20 MEQ tablet TAKE 1 TABLET EVERY DAY 90 tablet 1  . ranitidine (ZANTAC) 150 MG tablet Take 150 mg by mouth daily.    . simvastatin (ZOCOR) 20 MG tablet Take 1 tablet (20 mg total) by mouth every evening. Must schedule yearly exam and labs for refills 90 tablet 0  . sotalol (BETAPACE) 80 MG tablet TAKE 1 TABLET EVERY 12 HOURS 180 tablet 0  . traMADol (ULTRAM) 50 MG tablet Take 1 tablet (50 mg total) by mouth every 8 (eight) hours as needed. 15 tablet 0   No current facility-administered medications for this visit.    Allergies  Allergen Reactions  . Ace Inhibitors Swelling  . Tramadol Other (See Comments)    Night sweats,  bad dreams  . Celecoxib Other (See Comments)    REACTION: hair loss  . Cetirizine Hcl Other (See Comments)    REACTION: fatigue  . Penicillins Other (See Comments)    Unknown  . Sulfonamide Derivatives Rash      Discussed warning signs or symptoms. Please see discharge instructions. Patient expresses understanding.

## 2018-12-20 NOTE — Patient Instructions (Addendum)
Thank you for coming in today. Continue tylenol for pain.  Use heating pad.  Use a TENS unit.  Attend physical therapy.  Icy Hot is ok.   Stay active.    For fatigue look at allergy pill. Diphenhydramine is benadryl and make you sleepy.   Recheck in 4 weeks or sooner if needed.  If better ok to not see me again.    TENS UNIT: This is helpful for muscle pain and spasm.   Search and Purchase a TENS 7000 2nd edition at  www.tenspros.com or www.Amazon.com It should be less than $30.     TENS unit instructions: Do not shower or bathe with the unit on Turn the unit off before removing electrodes or batteries If the electrodes lose stickiness add a drop of water to the electrodes after they are disconnected from the unit and place on plastic sheet. If you continued to have difficulty, call the TENS unit company to purchase more electrodes. Do not apply lotion on the skin area prior to use. Make sure the skin is clean and dry as this will help prolong the life of the electrodes. After use, always check skin for unusual red areas, rash or other skin difficulties. If there are any skin problems, does not apply electrodes to the same area. Never remove the electrodes from the unit by pulling the wires. Do not use the TENS unit or electrodes other than as directed. Do not change electrode placement without consultating your therapist or physician. Keep 2 fingers with between each electrode. Wear time ratio is 2:1, on to off times.    For example on for 30 minutes off for 15 minutes and then on for 30 minutes off for 15 minutes

## 2018-12-27 ENCOUNTER — Other Ambulatory Visit: Payer: Self-pay

## 2018-12-27 ENCOUNTER — Ambulatory Visit (INDEPENDENT_AMBULATORY_CARE_PROVIDER_SITE_OTHER): Payer: Medicare HMO | Admitting: Rehabilitative and Restorative Service Providers"

## 2018-12-27 ENCOUNTER — Encounter: Payer: Self-pay | Admitting: Rehabilitative and Restorative Service Providers"

## 2018-12-27 DIAGNOSIS — R29898 Other symptoms and signs involving the musculoskeletal system: Secondary | ICD-10-CM | POA: Diagnosis not present

## 2018-12-27 DIAGNOSIS — M6281 Muscle weakness (generalized): Secondary | ICD-10-CM

## 2018-12-27 DIAGNOSIS — M545 Low back pain, unspecified: Secondary | ICD-10-CM

## 2018-12-27 NOTE — Therapy (Addendum)
[facility] [place] [address] [place], [place], [address] Phone: [phone]   Fax:  [phone]  Physical Therapy Evaluation  Patient Details  Name: Rebekah Harrell MRN: [id number] Date of Birth: 11/22/1941 Referring Provider (PT): Dr Clementeen Graham    Encounter Date: 12/27/2018  PT End of Session - 12/27/18 1729    Visit Number  1    Number of Visits  12    Date for PT Re-Evaluation  02/07/19    PT Start Time  1518    PT Stop Time  1615    PT Time Calculation (min)  57 min    Activity Tolerance  Patient tolerated treatment well       Past Medical History:  Diagnosis Date  . Aortic atherosclerosis (HCC)   . Arthritis    "lower back; knees" (11/26/2016)  . Asthma   . Chronic atrial fibrillation   . CKD (chronic kidney disease), stage III [facility])    patient unaware of this on 11/26/2016  . Complication of anesthesia    "had trouble waking me up in the late 1990's after gallbladder OR"  . GERD (gastroesophageal reflux disease)   . Heart murmur    "dx'd when I was a little kid"  . History of hiatal hernia   . Hyperlipidemia   . Hypertension   . Hypoglycemia     Past Surgical History:  Procedure Laterality Date  . CHOLECYSTECTOMY OPEN  1990s  . TONSILLECTOMY    . TUBAL LIGATION      There were no vitals filed for this visit.   Subjective Assessment - 12/27/18 1530    Subjective  Patient reports that she has had increased LBP since right before Christmas. 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 She noticed increasing pain through the day with symptoms continuing. she now has constant pain and limited activity level.     Pertinent History  Rt sciatica since ~ age 4 - some intermittent pain since that time; gall bladder surgery; arthritis; osteopenia; a-fib     Diagnostic tests  xrays - arthritic changes - facet arthritis L4/5; L5/S1     Patient Stated Goals  get rid of the back pain - learn how to prevent  recurrent pain     Currently in Pain?  Yes    Pain Score  6     Pain Location  Back    Pain Orientation  Lower;Left    Pain Descriptors / Indicators  Dull;Aching;Throbbing    Pain Type  Acute pain    Pain Onset  1 to 4 weeks ago    Pain Frequency  Constant    Aggravating Factors   prolonged sitting; transitioning from sit to stand; reaching; lifting    Pain Relieving Factors  tylenol and heated blanket          [facility] PT Assessment - 12/27/18 0001      Assessment   Medical Diagnosis  Lumbosacral strain    Referring Provider (PT)  Dr Clementeen Graham     Onset Date/Surgical Date  12/10/18    Hand Dominance  Right    Next MD Visit  PRN     Prior Therapy  none       Precautions   Precautions  None      Balance Screen   Has the patient fallen in the past 6 months  No    Has the patient had a decrease in activity level because of a fear  of falling?   No    Is the patient reluctant to leave their home because of a fear of falling?   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     Leisure  household chores; pet care       Observation/Other Assessments   Focus on Therapeutic Outcomes (FOTO)   55% limitation       Sensation   Additional Comments  WFL's per pt report       Posture/Postural Control   Posture Comments  sitting with wt shifted to Lt; crosses legs in sitting       AROM   Lumbar Flexion  60% discomfort LB    Lumbar Extension  20%    Lumbar - Right Side Bend  55% pulling Lt LB    Lumbar - Left Side Bend  60%     Lumbar - Right Rotation   60%    Lumbar - Left Rotation  55% tight Lt LB       Strength   Overall Strength Comments  WFL's bilat LE's       Flexibility   Hamstrings  tight Lt > Rt     Quadriceps  tight Lt > Rt     ITB  WFL's     Piriformis  tight Lt > Rt       Palpation   Spinal mobility  tenderness and tightness with CPA mobs L4/5/S1     Palpation comment  muscular tightness Lt > Rt QL/lats/piriformis/gluts       Special Tests   Other special tests  SLR (-); Fabers(-)                 Objective measurements completed on examination: See above findings.     Treatment consisted of exercises as noted in flow sheet/pt insturctions  E-stim and moist heat bilat lumbar to Lt posterior hip x 20 min IFC to pt tolerance        PT Education - 12/27/18 1614    Education Details  HEP TENS - to begin walking 5-10 min each day level surfaces     Person(s) Educated  Patient    Methods  Explanation;Demonstration;Tactile cues;Verbal cues;Handout    Comprehension  Verbalized understanding;Returned demonstration;Verbal cues required;Tactile cues required          PT Long Term Goals - 12/27/18 1734      PT LONG TERM GOAL #1   Title  Decrease pain allowing patient to participate in exercise program and return to normal functional activities 02/07/2019    Time  6    Period  Weeks      PT LONG TERM GOAL #2   Title  Improve core strength and stability allowing patient to perform home stagging with minimal to no difficulty 02/07/2019    Time  6    Period  Weeks    Status  New  PT LONG TERM GOAL #3   Title  Independent in HEP 02/07/2019    Time  6    Period  Weeks    Status  New      PT LONG TERM GOAL #4   Title  Iprove FOTO to </= 41% limitation 02/07/2019    Time  6    Period  Weeks    Status  New             Plan - 12/27/18 1730    Clinical Impression Statement  patient presents with lumbosacral pain and dysfunction. She has a long standing history of LBP and Rt sided sciatica.  Patient has poor posture and alignment; limited trunk and LE mobility; poor core strength and stability; muscular tightness Lt > Rt lumbar and posterior hip musculature; tight psoas musculature; sedentary lifestyle with periods of incresaed work demand. Patient will benefit from PT to address problems identifited; educate patient re back care/body mechanics/exercise.     History and Personal Factors relevant to plan of care:  LBP Rt sided sciatia pain x 30+ yrs     Clinical Presentation  Stable    Clinical Presentation due to:  poor core strength; tight hip flexors/psoas musculature; tight Lt lumbar and hip musculature     Clinical Decision Making  Low    Rehab Potential  Good    PT Frequency  2x / week    PT Duration  6 weeks    PT Treatment/Interventions  Patient/family education;ADLs/Self Care Home Management;Cryotherapy;Electrical Stimulation;Iontophoresis 4mg /ml Dexamethasone;Moist Heat;Ultrasound;Dry needling;Neuromuscular re-education;Therapeutic activities;Therapeutic exercise;Functional mobility training    PT Next Visit Plan  review HEP; add hip flexor stretch supine; manual work vs DN to Lt lumbar and hip musculature; core stabilization; modalities as indicated     Consulted and Agree with Plan of Care  Patient       Patient will benefit from skilled therapeutic intervention in order to improve the following deficits and impairments:  Postural dysfunction, Improper body mechanics, Pain, Increased fascial restricitons, Increased muscle spasms, Decreased strength, Decreased mobility, Decreased range of motion, Decreased activity tolerance  Visit Diagnosis: Acute bilateral low back pain without sciatica - Plan: PT plan of care cert/re-cert  Other symptoms and signs involving the musculoskeletal system - Plan: PT plan of care cert/re-cert  Muscle weakness (generalized) - Plan: PT plan of care cert/re-cert     Problem List Patient Active Problem List   Diagnosis Date Noted  .  Uncontrolled stage 2 hypertension 07/31/2017  . Angioedema due to angiotensin converting enzyme inhibitor (ACE-I) 07/31/2017  . Urticaria, chronic 07/31/2017  . Atrial fibrillation (HCC) 11/26/2016  . Aortic atherosclerosis (HCC) 07/07/2016  . CKD (chronic kidney disease) stage 3, GFR 30-59 ml/min (HCC) 10/29/2015  . Hypoglycemia 06/09/2014  . Osteoarthritis of left thumb 06/07/2014  . Osteoarthritis of right thumb 06/07/2014  . Arrhythmia 04/30/2011  . BACK PAIN 04/03/2010  . OSTEOPENIA 04/03/2010  . SEBORRHEIC KERATOSIS 02/18/2008  . CAROTID ARTERY STENOSIS, BILATERAL 02/15/2008  . Asthma 08/03/2007  . IBS 08/03/2007  . Hyperlipidemia 07/05/2007  . HYPERTENSION, BENIGN ESSENTIAL 07/05/2007    Kelisha Dall Rober MinionP Shalea Tomczak PT, MPH  12/27/2018, 5:39 PM  [facility] [address] [place], [place], [address] Phone: [phone]   Fax:  [phone]  Name: Rebekah Harrell MRN: [id number] Date of Birth: 02-Dec-1941

## 2018-12-27 NOTE — Patient Instructions (Signed)
Trunk: Prone Extension (Press-Ups)    Lie on stomach on firm, flat surface. Relax bottom and legs. Raise chest in air with elbows straight. Keep hips flat on surface, sag stomach. Hold __1-2__ seconds. Repeat __5-10 __ times. Do _2__ sessions per day. CAUTION: Movement should be gentle and slow.   Abdominal Bracing With Pelvic Floor (Hook-Lying)    With neutral spine, tighten pelvic floor and abdominals sucking belly button to back bone; tighten muscles in the low back at waist; exhale slowly.  Hold 10 sec Repeat __10_ times. Do _several__ times a day. Progress to do this in sitting; standing; walking and with functional activities   Piriformis Stretch   Lying on back, pull right knee toward opposite shoulder. Hold 30 seconds. Repeat 3 times. Do 2-3 sessions per day.   Quads / HF, Prone KNEE: Quadriceps - Prone    Place strap around ankle. Bring ankle toward buttocks. Press hip into surface. Hold 30 seconds. Repeat 3 times per session. Do 2-3 sessions per day.      TENS UNIT: This is helpful for muscle pain and spasm.   Search and Purchase a TENS 7000 2nd edition at www.tenspros.com. It should be less than $30.     TENS unit instructions: Do not shower or bathe with the unit on Turn the unit off before removing electrodes or batteries If the electrodes lose stickiness add a drop of water to the electrodes after they are disconnected from the unit and place on plastic sheet. If you continued to have difficulty, call the TENS unit company to purchase more electrodes. Do not apply lotion on the skin area prior to use. Make sure the skin is clean and dry as this will help prolong the life of the electrodes. After use, always check skin for unusual red areas, rash or other skin difficulties. If there are any skin problems, does not apply electrodes to the same area. Never remove the electrodes from the unit by pulling the wires. Do not use the TENS unit or electrodes other  than as directed. Do not change electrode placement without consultating your therapist or physician. Keep 2 fingers with between each electrode.

## 2018-12-31 ENCOUNTER — Encounter: Payer: Self-pay | Admitting: Rehabilitative and Restorative Service Providers"

## 2018-12-31 ENCOUNTER — Ambulatory Visit (INDEPENDENT_AMBULATORY_CARE_PROVIDER_SITE_OTHER): Payer: Medicare HMO | Admitting: Rehabilitative and Restorative Service Providers"

## 2018-12-31 DIAGNOSIS — R29898 Other symptoms and signs involving the musculoskeletal system: Secondary | ICD-10-CM | POA: Diagnosis not present

## 2018-12-31 DIAGNOSIS — M545 Low back pain, unspecified: Secondary | ICD-10-CM

## 2018-12-31 DIAGNOSIS — M6281 Muscle weakness (generalized): Secondary | ICD-10-CM | POA: Diagnosis not present

## 2018-12-31 NOTE — Therapy (Signed)
[facility] [address] [place], [place], [address] Phone: [phone]   Fax:  [phone]  Physical Therapy Treatment  Patient Details  Name: Rebekah Harrell MRN: [id number] Date of Birth: 17-May-1941 Referring Provider (PT): Dr Clementeen GrahamEvan Corey    Encounter Date: 12/31/2018  PT End of Session - 12/31/18 1159    Visit Number  2    Number of Visits  12    Date for PT Re-Evaluation  02/07/19    PT Start Time  1150    PT Stop Time  1242    PT Time Calculation (min)  52 min    Activity Tolerance  Patient tolerated treatment well       Past Medical History:  Diagnosis Date  . Aortic atherosclerosis (HCC)   . Arthritis    "lower back; knees" (11/26/2016)  . Asthma   . Chronic atrial fibrillation   . CKD (chronic kidney disease), stage III [facility](HCC)    patient unaware of this on 11/26/2016  . Complication of anesthesia    "had trouble waking me up in the late 1990's after gallbladder OR"  . GERD (gastroesophageal reflux disease)   . Heart murmur    "dx'd when I was a little kid"  . History of hiatal hernia   . Hyperlipidemia   . Hypertension   . Hypoglycemia     Past Surgical History:  Procedure Laterality Date  . CHOLECYSTECTOMY OPEN  1990s  . TONSILLECTOMY    . TUBAL LIGATION      There were no vitals filed for this visit.  Subjective Assessment - 12/31/18 1200    Subjective  Patient reports that she stagged a house in the last 2 days and has some increased pain in the low back. She was able to do her exercises only once. Did have a headache when she left first treatment which she feels may be related to lying "flat"(was on doubled pillow in supine). Sleeps on 3 pillows - either side.     Currently in Pain?  Yes    Pain Score  4     Pain Location  Back    Pain Orientation  Lower;Left    Pain Descriptors / Indicators  Dull;Aching;Burning    Pain Type  Acute pain    Pain Onset  1 to 4 weeks ago    Pain Frequency   Intermittent                       [facility] Adult PT Treatment/Exercise - 12/31/18 0001      Lumbar Exercises: Stretches   Passive Hamstring Stretch  Right;Left;2 reps;30 seconds    Press Ups  10 reps   1-2 sec hold    Quad Stretch  Right;Left;30 seconds   prone with strap    Piriformis Stretch  Right;Left;3 reps;30 seconds   supine travell      Lumbar Exercises: Supine   Other Supine Lumbar Exercises  3 part core 10 sec x 10       Modalities   Modalities  --   propped with two pillows and ^head rest in supine      Moist Heat Therapy   Number Minutes Moist Heat  20 Minutes   sensitive to heat - used towel on LB    Moist Heat Location  Lumbar Spine      Electrical Stimulation   Electrical Stimulation Location  bilat lumbar to Lt posterior hip  Electrical Stimulation Action  IFC     Electrical Stimulation Parameters  to tolerance    Electrical Stimulation Goals  Pain;Tone      Manual Therapy   Manual therapy comments  pt prone     Soft tissue mobilization  working through Lt lumbar to Lt posterior hip muscuature - tight glut min/med into piriformis                   PT Long Term Goals - 12/27/18 1734      PT LONG TERM GOAL #1   Title  Decrease pain allowing patient to participate in exercise program and return to normal functional activities 02/07/2019    Time  6    Period  Weeks      PT LONG TERM GOAL #2   Title  Improve core strength and stability allowing patient to perform home stagging with minimal to no difficulty 02/07/2019    Time  6    Period  Weeks    Status  New      PT LONG TERM GOAL #3   Title  Independent in HEP 02/07/2019    Time  6    Period  Weeks    Status  New      PT LONG TERM GOAL #4   Title  Iprove FOTO to </= 41% limitation 02/07/2019    Time  6    Period  Weeks    Status  New            Plan - 12/31/18 1200    Clinical Impression Statement  Patient reports doing her exercises once since first  appointment. Ordered a TENS unit. Back is irritated today from stagging a house the past two days. Patient continues to have palpable tightness through the lt posterior hip and LB - responded well to manual work and modalities. Patient has difficulty lying supine with report of headage when she is flat. Adjusted the position for supine with heat.     Rehab Potential  Good    PT Frequency  2x / week    PT Duration  6 weeks    PT Treatment/Interventions  Patient/family education;ADLs/Self Care Home Management;Cryotherapy;Electrical Stimulation;Iontophoresis 4mg /ml Dexamethasone;Moist Heat;Ultrasound;Dry needling;Neuromuscular re-education;Therapeutic activities;Therapeutic exercise;Functional mobility training    PT Next Visit Plan  review HEP; add hip flexor stretch supine; assess response to manual work (? DN) to Lt lumbar and hip musculature; core stabilization; modalities as indicated     Consulted and Agree with Plan of Care  Patient       Patient will benefit from skilled therapeutic intervention in order to improve the following deficits and impairments:  Postural dysfunction, Improper body mechanics, Pain, Increased fascial restricitons, Increased muscle spasms, Decreased strength, Decreased mobility, Decreased range of motion, Decreased activity tolerance  Visit Diagnosis: Acute bilateral low back pain without sciatica  Other symptoms and signs involving the musculoskeletal system  Muscle weakness (generalized)     Problem List Patient Active Problem List   Diagnosis Date Noted  . Uncontrolled stage 2 hypertension 07/31/2017  . Angioedema due to angiotensin converting enzyme inhibitor (ACE-I) 07/31/2017  . Urticaria, chronic 07/31/2017  . Atrial fibrillation (HCC) 11/26/2016  . Aortic atherosclerosis (HCC) 07/07/2016  . CKD (chronic kidney disease) stage 3, GFR 30-59 ml/min (HCC) 10/29/2015  . Hypoglycemia 06/09/2014  . Osteoarthritis of left thumb 06/07/2014  . Osteoarthritis  of right thumb 06/07/2014  . Arrhythmia 04/30/2011  . BACK PAIN 04/03/2010  . OSTEOPENIA 04/03/2010  . SEBORRHEIC  KERATOSIS 02/18/2008  . CAROTID ARTERY STENOSIS, BILATERAL 02/15/2008  . Asthma 08/03/2007  . IBS 08/03/2007  . Hyperlipidemia 07/05/2007  . HYPERTENSION, BENIGN ESSENTIAL 07/05/2007    Rebekah Harrell PT, MPH  12/31/2018, 12:41 PM  [facility] [address] [place], [place], [address] Phone: [phone]   Fax:  [phone]  Name: Rebekah Harrell MRN: [id number] Date of Birth: 10-14-1941

## 2019-01-03 ENCOUNTER — Ambulatory Visit: Payer: Medicare HMO | Admitting: Rehabilitative and Restorative Service Providers"

## 2019-01-03 ENCOUNTER — Encounter: Payer: Self-pay | Admitting: Rehabilitative and Restorative Service Providers"

## 2019-01-03 DIAGNOSIS — M6281 Muscle weakness (generalized): Secondary | ICD-10-CM

## 2019-01-03 DIAGNOSIS — M545 Low back pain, unspecified: Secondary | ICD-10-CM

## 2019-01-03 DIAGNOSIS — R29898 Other symptoms and signs involving the musculoskeletal system: Secondary | ICD-10-CM | POA: Diagnosis not present

## 2019-01-03 NOTE — Patient Instructions (Addendum)
Chair Sitting    Sit at edge of seat, spine straight, one leg extended. Put a hand on each thigh and bend forward from the hip, keeping spine straight. Allow hand on extended leg to reach toward toes. Support upper body with other arm. Hold __30_ seconds. Repeat __3_ times per session. Do _2__ sessions per day.   Gastroc, Standing    Stand, right foot behind, heel on floor and turned slightly out, leg straight, forward leg bent. Keeping arms straight, push pelvis forward until stretch is felt in calf. Hold __30_ seconds. Repeat _3__ times per session. Do _2__ sessions per day.    Back Wall Slide    With feet __10-12__ inches from wall, lean as much of back against the wall as possible. Gently squat down __3-4_ inches, keeping back against wall. Hold __5-10__ seconds while counting out loud. Repeat __10__ times. Do _2___ sessions per day.   Resisted External Rotation: in Neutral - Bilateral palms up    PALMS UP Sit or stand, tubing in both hands, elbows at sides, bent to 90, forearms forward. Pinch shoulder blades together and rotate forearms out. Keep elbows at sides. Repeat __10__ times per set. Do _2-3___ sets per session. Do _2-3___ sessions per day.   Low Row: Standing   Face anchor, feet shoulder width apart. Palms up, pull arms back, squeezing shoulder blades together. Repeat 10__ times per set. Do 2-3__ sets per session. Do 2__ sessions per week. Anchor Height: Waist   Strengthening: Resisted Extension   Hold tubing in right hand, arm forward. Pull arm back, elbow straight. Repeat _10___ times per set. Do 2-3____ sets per session. Do 2___ sessions per day.  Self massage using ~ 4 inch plastic ball   .

## 2019-01-03 NOTE — Therapy (Signed)
[facility] [address] [place], [place], [address] Phone: [phone]   Fax:  [phone]  Physical Therapy Treatment  Patient Details  Name: Rebekah Harrell MRN: [id number] Date of Birth: 1941-04-02 Referring Provider (PT): Dr Clementeen Graham    Encounter Date: 01/03/2019  PT End of Session - 01/03/19 1148    Visit Number  3    Number of Visits  12    Date for PT Re-Evaluation  02/07/19    PT Start Time  1147    PT Stop Time  1243    PT Time Calculation (min)  56 min    Activity Tolerance  Patient tolerated treatment well       Past Medical History:  Diagnosis Date  . Aortic atherosclerosis (HCC)   . Arthritis    "lower back; knees" (11/26/2016)  . Asthma   . Chronic atrial fibrillation   . CKD (chronic kidney disease), stage III [facility])    patient unaware of this on 11/26/2016  . Complication of anesthesia    "had trouble waking me up in the late 1990's after gallbladder OR"  . GERD (gastroesophageal reflux disease)   . Heart murmur    "dx'd when I was a little kid"  . History of hiatal hernia   . Hyperlipidemia   . Hypertension   . Hypoglycemia     Past Surgical History:  Procedure Laterality Date  . CHOLECYSTECTOMY OPEN  1990s  . TONSILLECTOMY    . TUBAL LIGATION      There were no vitals filed for this visit.  Subjective Assessment - 01/03/19 1154    Subjective  Patient reports that her back is feeling some better. She had a quiet weekend. she has a headache form lying down to do the stretches. She has to have her head raised. Sleeps elevated. never flat. HA did not last a long last time as the first time she was here.     Currently in Pain?  Yes    Pain Score  1     Pain Location  Back    Pain Orientation  Right;Left    Pain Descriptors / Indicators  Aching;Dull;Nagging    Pain Onset  1 to 4 weeks ago    Pain Frequency  Constant                       [facility] Adult PT  Treatment/Exercise - 01/03/19 0001      Therapeutic Activites    Therapeutic Activities  --   instructed in self massage with ~4 in plastic ball      Lumbar Exercises: Stretches   Passive Hamstring Stretch  Right;Left;2 reps;30 seconds   sitting    Hip Flexor Stretch  Right;Left;2 reps;30 seconds   seated    Gastroc Stretch  Right;Left;2 reps;30 seconds      Lumbar Exercises: Standing   Wall Slides  10 reps;3 seconds   mini squats - to avoid knee pain - core engaged    Scapular Retraction  Strengthening;Both;10 reps;Theraband   core engaged   Theraband Level (Scapular Retraction)  Level 2 (Red)    Row  Strengthening;Both;10 reps;Theraband   core engaged    Theraband Level (Row)  Level 2 (Red)    Shoulder Extension  Strengthening;Both;10 reps;Theraband   core engaged    Theraband Level (Shoulder Extension)  Level 2 (Red)      Moist Heat Therapy   Number Minutes Moist Heat  20  Minutes    Moist Heat Location  Lumbar Spine      Electrical Stimulation   Electrical Stimulation Location  bilat lumbar     Electrical Stimulation Action  IFC    Electrical Stimulation Parameters  to tolerance    Electrical Stimulation Goals  Pain;Tone             PT Education - 01/03/19 1220    Education Details  HEP     Person(s) Educated  Patient    Methods  Explanation;Demonstration;Tactile cues;Verbal cues;Handout    Comprehension  Verbalized understanding;Returned demonstration;Verbal cues required;Tactile cues required          PT Long Term Goals - 12/27/18 1734      PT LONG TERM GOAL #1   Title  Decrease pain allowing patient to participate in exercise program and return to normal functional activities 02/07/2019    Time  6    Period  Weeks      PT LONG TERM GOAL #2   Title  Improve core strength and stability allowing patient to perform home stagging with minimal to no difficulty 02/07/2019    Time  6    Period  Weeks    Status  New      PT LONG TERM GOAL #3   Title   Independent in HEP 02/07/2019    Time  6    Period  Weeks    Status  New      PT LONG TERM GOAL #4   Title  Iprove FOTO to </= 41% limitation 02/07/2019    Time  6    Period  Weeks    Status  New            Plan - 01/03/19 1154    Clinical Impression Statement  Pain is decreased with decreased activity and rest. Patient reports that she is unable to lie down for exercise d/t headaches. She never lies flat - sleeps on 2-3 pillows at night. Modificied exercise program to provide stretches in sitting/standing and core stabilization in standing. Patient had no difficulty with additional exercises. E-stim administered in partially reclined position.     Rehab Potential  Good    PT Frequency  2x / week    PT Duration  6 weeks    PT Treatment/Interventions  Patient/family education;ADLs/Self Care Home Management;Cryotherapy;Electrical Stimulation;Iontophoresis 4mg /ml Dexamethasone;Moist Heat;Ultrasound;Dry needling;Neuromuscular re-education;Therapeutic activities;Therapeutic exercise;Functional mobility training    PT Next Visit Plan  review HEP; add hip flexor stretch supine; assess response to manual work (? DN) to Lt lumbar and hip musculature; core stabilization; modalities as indicated     Consulted and Agree with Plan of Care  Patient       Patient will benefit from skilled therapeutic intervention in order to improve the following deficits and impairments:  Postural dysfunction, Improper body mechanics, Pain, Increased fascial restricitons, Increased muscle spasms, Decreased strength, Decreased mobility, Decreased range of motion, Decreased activity tolerance  Visit Diagnosis: Acute bilateral low back pain without sciatica  Other symptoms and signs involving the musculoskeletal system  Muscle weakness (generalized)     Problem List Patient Active Problem List   Diagnosis Date Noted  . Uncontrolled stage 2 hypertension 07/31/2017  . Angioedema due to angiotensin converting  enzyme inhibitor (ACE-I) 07/31/2017  . Urticaria, chronic 07/31/2017  . Atrial fibrillation (HCC) 11/26/2016  . Aortic atherosclerosis (HCC) 07/07/2016  . CKD (chronic kidney disease) stage 3, GFR 30-59 ml/min (HCC) 10/29/2015  . Hypoglycemia 06/09/2014  . Osteoarthritis of left  thumb 06/07/2014  . Osteoarthritis of right thumb 06/07/2014  . Arrhythmia 04/30/2011  . BACK PAIN 04/03/2010  . OSTEOPENIA 04/03/2010  . SEBORRHEIC KERATOSIS 02/18/2008  . CAROTID ARTERY STENOSIS, BILATERAL 02/15/2008  . Asthma 08/03/2007  . IBS 08/03/2007  . Hyperlipidemia 07/05/2007  . HYPERTENSION, BENIGN ESSENTIAL 07/05/2007    Lelania Bia Rober MinionP Zamari Bonsall PT, MPH  01/03/2019, 12:44 PM  [facility] [address] [place], [place], [address] Phone: [phone]   Fax:  [phone]  Name: Rebekah Harrell MRN: [id number] Date of Birth: 09-14-1941

## 2019-01-06 ENCOUNTER — Ambulatory Visit (INDEPENDENT_AMBULATORY_CARE_PROVIDER_SITE_OTHER): Payer: Medicare HMO | Admitting: Physical Therapy

## 2019-01-06 ENCOUNTER — Encounter: Payer: Self-pay | Admitting: Physical Therapy

## 2019-01-06 DIAGNOSIS — R29898 Other symptoms and signs involving the musculoskeletal system: Secondary | ICD-10-CM

## 2019-01-06 DIAGNOSIS — M6281 Muscle weakness (generalized): Secondary | ICD-10-CM | POA: Diagnosis not present

## 2019-01-06 DIAGNOSIS — M545 Low back pain, unspecified: Secondary | ICD-10-CM

## 2019-01-06 NOTE — Therapy (Addendum)
[place], [place], [address] Phone: [phone]   Fax:  [phone]  Physical Therapy Treatment  Patient Details  Name: LILYAN PRETE MRN: [id number] Date of Birth: Jul 07, 1941 Referring Provider (PT): Dr Lynne Leader    Encounter Date: 01/06/2019  PT End of Session - 01/06/19 1157    Visit Number  4    Number of Visits  12    Date for PT Re-Evaluation  02/07/19    PT Start Time  1155   pt arrived late   PT Stop Time  1253    PT Time Calculation (min)  58 min    Activity Tolerance  Patient tolerated treatment well    Behavior During Therapy  [facility] for tasks assessed/performed       Past Medical History:  Diagnosis Date  . Aortic atherosclerosis ([place])   . Arthritis    "lower back; knees" (11/26/2016)  . Asthma   . Chronic atrial fibrillation   . CKD (chronic kidney disease), stage III [facility])    patient unaware of this on 11/26/2016  . Complication of anesthesia    "had trouble waking me up in the late 1990's after gallbladder OR"  . GERD (gastroesophageal reflux disease)   . Heart murmur    "dx'd when I was a little kid"  . History of hiatal hernia   . Hyperlipidemia   . Hypertension   . Hypoglycemia     Past Surgical History:  Procedure Laterality Date  . CHOLECYSTECTOMY OPEN  1990s  . TONSILLECTOMY    . TUBAL LIGATION      There were no vitals filed for this visit.  Subjective Assessment - 01/06/19 1157    Subjective  "I'm almost over this". Pt reports she hasn't done her exercises since she was here last (too busy) but notes that things are improving.    Patient Stated Goals  get rid of the back pain - learn how to prevent recurrent pain     Currently in Pain?  No/denies    Pain Score  0-No pain       [facility] Adult PT Treatment/Exercise - 01/06/19 0001      Self-Care   Self-Care  Other Self-Care Comments    Other Self-Care Comments   pt educated on TENS application (  parameters, safety, set up and operation)- pt returned demo with cues and verbalized understanding.       Exercises   Exercises  Lumbar      Lumbar Exercises: Stretches   Passive Hamstring Stretch  Right;Left;2 reps;30 seconds   sitting    Hip Flexor Stretch  Right;Left;2 reps;30 seconds   seated    Quad Stretch  Right;Left;2 reps;30 seconds   seated   Piriformis Stretch  Right;Left;2 reps;20 seconds   seated   Gastroc Stretch  --    Other Lumbar Stretch Exercise  midlevel doorway stretch with elbows bent x 15 (increased pain) and unilateral with elbow straight (improved tolerance x 20 sec each, 2 reps       Lumbar Exercises: Aerobic   Nustep  L4: arms(9) x 6 min    PTA present to monitor and discuss progress     Lumbar Exercises: Standing   Wall Slides  3 seconds;15 reps   mini squats - to avoid knee pain - core engaged    Scapular Retraction  Strengthening;Both;10 reps;Theraband   core engaged   Theraband Level (Scapular Retraction)  Level 2 (Red)  Row  Both;10 reps;Theraband    Theraband Level (Row)  Level 3 (Green)    Shoulder Extension  Both;10 reps;Theraband    Theraband Level (Shoulder Extension)  Level 2 (Red)      Lumbar Exercises: Seated   Other Seated Lumbar Exercises  ab set with lap press x 5 sec x 5 reps with cues for technique.       Moist Heat Therapy   Number Minutes Moist Heat  10 Minutes    Moist Heat Location  Lumbar Spine   towel used, pt sensitive to heat     Electrical Stimulation   Electrical Stimulation Location  bilat lumbar     Electrical Stimulation Action  TENS    Electrical Stimulation Parameters  to tolerance    Electrical Stimulation Goals  Pain             PT Education - 01/06/19 1245    Education Details  HEP, TENS operating / parameters instruction    Person(s) Educated  Patient    Methods  Explanation;Handout;Demonstration    Comprehension  Verbalized understanding;Returned demonstration          PT Long Term Goals  - 12/27/18 1734      PT LONG TERM GOAL #1   Title  Decrease pain allowing patient to participate in exercise program and return to normal functional activities 02/07/2019    Time  6    Period  Weeks      PT LONG TERM GOAL #2   Title  Improve core strength and stability allowing patient to perform home stagging with minimal to no difficulty 02/07/2019    Time  6    Period  Weeks    Status  New      PT LONG TERM GOAL #3   Title  Independent in HEP 02/07/2019    Time  6    Period  Weeks    Status  New      PT LONG TERM GOAL #4   Title  Iprove FOTO to </= 41% limitation 02/07/2019    Time  6    Period  Weeks    Status  New            Plan - 01/06/19 1311    Clinical Impression Statement  Pt tolerated all exercises with mild increase in discomfort in upper/lower back.  Pt instructed on use of TEN unit; able to returned demo with cues.  Pt's overall pain rating is decreasing.  Pt progressing towards therapy goals.     Rehab Potential  Good    PT Frequency  2x / week    PT Duration  6 weeks    PT Treatment/Interventions  Patient/family education;ADLs/Self Care Home Management;Cryotherapy;Electrical Stimulation;Iontophoresis 4mg/ml Dexamethasone;Moist Heat;Ultrasound;Dry needling;Neuromuscular re-education;Therapeutic activities;Therapeutic exercise;Functional mobility training    PT Next Visit Plan  continue spinal stabilization and stretches.  modalities and manual therapy as indicated.     Consulted and Agree with Plan of Care  Patient       Patient will benefit from skilled therapeutic intervention in order to improve the following deficits and impairments:  Postural dysfunction, Improper body mechanics, Pain, Increased fascial restricitons, Increased muscle spasms, Decreased strength, Decreased mobility, Decreased range of motion, Decreased activity tolerance  Visit Diagnosis: Acute bilateral low back pain without sciatica  Other symptoms and signs involving the  musculoskeletal system  Muscle weakness (generalized)     Problem List Patient Active Problem List   Diagnosis Date Noted  . Uncontrolled stage   2 hypertension 07/31/2017  . Angioedema due to angiotensin converting enzyme inhibitor (ACE-I) 07/31/2017  . Urticaria, chronic 07/31/2017  . Atrial fibrillation ([place]) 11/26/2016  . Aortic atherosclerosis ([place]) 07/07/2016  . CKD (chronic kidney disease) stage 3, GFR 30-59 ml/min (HCC) 10/29/2015  . Hypoglycemia 06/09/2014  . Osteoarthritis of left thumb 06/07/2014  . Osteoarthritis of right thumb 06/07/2014  . Arrhythmia 04/30/2011  . BACK PAIN 04/03/2010  . OSTEOPENIA 04/03/2010  . SEBORRHEIC KERATOSIS 02/18/2008  . CAROTID ARTERY STENOSIS, BILATERAL 02/15/2008  . Asthma 08/03/2007  . IBS 08/03/2007  . Hyperlipidemia 07/05/2007  . HYPERTENSION, BENIGN ESSENTIAL 07/05/2007   Kerin Perna, PTA 01/06/19 1:17 PM  [facility] [place], [place], [address] Phone: [phone]   Fax:  [phone]  Name: DAURICE OVANDO MRN: [id number] Date of Birth: [birth year]  PHYSICAL THERAPY DISCHARGE SUMMARY  Visits from Start of Care: 4  Current functional level related to goals / functional outcomes: Progressed well with rehab. See last progress note for discharge status    Remaining deficits: Unknown    Education / Equipment: HEP  Plan: Patient agrees to discharge.  Patient goals were partially met. Patient is being discharged due to being pleased with the current functional level.  ?????    Celyn P. Helene Kelp PT, MPH 03/02/19 2:18 PM

## 2019-01-06 NOTE — Patient Instructions (Signed)
   Access Code: [email or web address]  URL: [email or web address]  Date: 01/06/2019  Prepared by: Mayer Camel   Exercises  Seated Hamstring Stretch - 3 reps - 1 sets - 20 seconds hold - 1-2x daily - 7x weekly  Seated Piriformis Stretch - 3 reps - 1 sets - 20 seconds hold - 1-2x daily - 7x weekly  Single Arm Doorway Pec Stretch at 120 Degrees Abduction - 3 reps - 1 sets - 20 seconds hold - 1-2x daily - 7x weekly

## 2019-01-10 ENCOUNTER — Encounter: Payer: Medicare HMO | Admitting: Rehabilitative and Restorative Service Providers"

## 2019-01-12 ENCOUNTER — Encounter: Payer: Medicare HMO | Admitting: Rehabilitative and Restorative Service Providers"

## 2019-01-14 ENCOUNTER — Encounter: Payer: Medicare HMO | Admitting: Physical Therapy

## 2019-01-17 ENCOUNTER — Other Ambulatory Visit: Payer: Self-pay | Admitting: Cardiology

## 2019-01-17 ENCOUNTER — Encounter: Payer: Medicare HMO | Admitting: Physical Therapy

## 2019-01-20 ENCOUNTER — Encounter: Payer: Medicare HMO | Admitting: Rehabilitative and Restorative Service Providers"

## 2019-03-02 ENCOUNTER — Other Ambulatory Visit: Payer: Self-pay | Admitting: Cardiology

## 2019-03-02 DIAGNOSIS — I10 Essential (primary) hypertension: Secondary | ICD-10-CM

## 2019-03-02 NOTE — Progress Notes (Signed)
Subjective:   Rebekah Harrell is a 78 y.o. female who presents for Medicare Annual (Subsequent) preventive examination.  Review of Systems:  No ROS.  Medicare Wellness Visit. Additional risk factors are reflected in the social history.  Cardiac Risk Factors include: advanced age (>11men, >79 women);hypertension Sleep patterns:  Getting6-7 hours of sleep a night. Wakes up 2 times to void. Wakes up and feels rested. Home Safety/Smoke Alarms: Feels safe in home. Smoke alarms in place.  Living environment; Lives with dog and cat in a 3 story home and hand rails are in place on the stairs. Shower is a step over tub and no grab bars in place Seat Belt Safety/Bike Helmet: Wears seat belt.   Female:   Pap-  Aged out     Mammo- will schedule ordered      Dexa scan- ordered while in for visit       CCS- aged out     Objective:     Vitals: BP (!) 130/51 (BP Location: Left Arm, Patient Position: Sitting, Cuff Size: Normal)   Pulse 69   Ht  (1.626 m)   Wt 176 lb (79.8 kg)   SpO2 98%   BMI 30.21 kg/m   Body mass index is 30.21 kg/m.  Advanced Directives 03/07/2019 12/27/2018 12/09/2016 11/26/2016 09/07/2015 06/07/2014  Does Patient Have a Medical Advance Directive? No No No No No Patient does not have advance directive;Patient would like information  Would patient like information on creating a medical advance directive? No - Patient declined No - Patient declined No - Patient declined No - Patient declined Yes - Transport planner given Advance directive brochure given (Outpatient ONLY)    Tobacco Social History   Tobacco Use  Smoking Status Never Smoker  Smokeless Tobacco Never Used     Counseling given: No   Clinical Intake:                       Past Medical History:  Diagnosis Date  . Aortic atherosclerosis (HCC)   . Arthritis    "lower back; knees" (11/26/2016)  . Asthma   . Chronic atrial fibrillation   . CKD (chronic kidney disease), stage III  [facility])    patient unaware of this on 11/26/2016  . Complication of anesthesia    "had trouble waking me up in the late 1990's after gallbladder OR"  . GERD (gastroesophageal reflux disease)   . Heart murmur    "dx'd when I was a little kid"  . History of hiatal hernia   . Hyperlipidemia   . Hypertension   . Hypoglycemia    Past Surgical History:  Procedure Laterality Date  . CHOLECYSTECTOMY OPEN  1990s  . TONSILLECTOMY    . TUBAL LIGATION     Family History  Problem Relation Age of Onset  . CAD Mother        HEART ATTACK  . Colon cancer Father        KIDNEY FAILURE/COLON CANCER  . Atrial fibrillation Brother   . Heart failure Maternal Grandmother   . Kidney failure Maternal Grandfather   . Heart attack Paternal Grandmother   . Healthy Brother    Social History   Socioeconomic History  . Marital status: Divorced    Spouse name: Not on file  . Number of children: 3  . Years of education: 27  . Highest education level: Associate degree: academic program  Occupational History  . Occupation: stages houses for [employer]  Comment: self employed  Social Needs  . Financial resource strain: Not very hard  . Food insecurity:    Worry: Never true    Inability: Never true  . Transportation needs:    Medical: No    Non-medical: No  Tobacco Use  . Smoking status: Never Smoker  . Smokeless tobacco: Never Used  Substance and Sexual Activity  . Alcohol use: Not Currently  . Drug use: No  . Sexual activity: Not Currently  Lifestyle  . Physical activity:    Days per week: 0 days    Minutes per session: 0 min  . Stress: Not at all  Relationships  . Social connections:    Talks on phone: More than three times a week    Gets together: Once a week    Attends religious service: Never    Active member of club or organization: No    Attends meetings of clubs or organizations: Never    Relationship status: Divorced  Other Topics Concern  . 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 Drinks tea during the day.    Outpatient Encounter Medications as of 03/07/2019  Medication Sig  . AMBULATORY NON FORMULARY MEDICATION Medication Name: allergy medication  . amLODipine (NORVASC) 5 MG tablet Take 1 tablet (5 mg total) by mouth daily. NEEDS APPOINTMENT FOR FUTURE REFILLS  . CALCIUM PO Take 1 tablet by mouth at bedtime.  . Cholecalciferol (D-3-5) 5000 units capsule Take 5,000 Units by mouth daily.  . hydrALAZINE (APRESOLINE) 50 MG tablet Take 1 tablet (50 mg total) by mouth 3 (three) times daily. NEEDS APPOINTMENT FOR FUTURE REFILLS  . Magnesium 200 MG TABS Take 1 tablet (200 mg total) by mouth daily.  . Multiple Vitamins-Minerals (MULTIVITAMIN PO) Take 1 tablet by mouth at bedtime.  Marland Kitchen omeprazole (PRILOSEC) 20 MG capsule TAKE 1 CAPSULE EVERY DAY  . Potassium Chloride ER 20 MEQ TBCR Take 20 mEq by mouth daily.  . ranitidine (ZANTAC) 150 MG tablet Take 150 mg by mouth daily.  . simvastatin (ZOCOR) 20 MG tablet Take 1 tablet (20 mg total) by mouth every evening. Must schedule yearly exam and labs for refills  . sotalol (BETAPACE) 80 MG tablet Take 1 tablet (80 mg total) by mouth 2 (two) times daily. NEEDS APPOINTMENT FOR FUTURE REFILLS  . traMADol (ULTRAM) 50 MG tablet Take 1 tablet (50 mg total) by mouth every 8 (eight) hours as needed.  . furosemide (LASIX) 40 MG tablet TAKE 1 TABLET EVERY DAY (Patient not taking: Reported on 03/07/2019)  . potassium chloride SA (K-DUR,KLOR-CON) 20 MEQ tablet TAKE 1 TABLET EVERY DAY (Patient not taking: Reported on 12/27/2018)  . [DISCONTINUED] amLODipine (NORVASC) 5 MG tablet Take 1 tablet (5 mg total) by mouth daily. Need to make an over due appointment  . [DISCONTINUED] hydrALAZINE (APRESOLINE) 50 MG tablet TAKE 1 TABLET THREE TIMES DAILY  . [DISCONTINUED] sotalol (BETAPACE) 80 MG tablet Take 1 tablet (80 mg total) by mouth every 12 (twelve) hours. Need to make an overdue office  visit   No facility-administered encounter medications on file as of 03/07/2019.     Activities of Daily Living In your present state of health, do you have any difficulty performing the following activities: 03/07/2019  Hearing? N  Vision? N  Difficulty concentrating or making decisions? N  Walking or climbing stairs? Y  Comment right knee pain  Dressing or bathing? N  Doing errands, shopping? N  Preparing Food and eating ? N  Using the Toilet? N  In the past six months, have you accidently leaked urine? N  Do you have problems with loss of bowel control? N  Managing your Medications? N  Managing your Finances? N  Housekeeping or managing your Housekeeping? N  Some recent data might be hidden    Patient Care Team: Agapito Games, MD as PCP - General (Family Medicine)    Assessment:   This is a routine wellness examination for Maelani.Physical assessment deferred to PCP.   Exercise Activities and Dietary recommendations Current Exercise Habits: The patient does not participate in regular exercise at present, Exercise limited by: None identified Diet Eats fairly healthy with vegetables and fruits. Likes carbs. Breakfast:yogurt with fruit Lunch: skips Dinner: Meat and a vegetable      Goals    . Exercise 3x per week (30 min per time)     Get out and walk at least 3 times a week for 30 minutes at a time.    . Weight (lb) < 200 lb (90.7 kg)     Would like to loose at least 10 lbs if not more. 25 lbs.       Fall Risk Fall Risk  03/07/2019 04/23/2018 12/09/2016 09/07/2015 08/07/2014  Falls in the past year? 0 No No No No  Follow up Falls prevention discussed - - - -   Is the patient's home free of loose throw rugs in walkways, pet beds, electrical cords, etc?   yes      Grab bars in the bathroom? no      Handrails on the stairs?   yes      Adequate lighting?   yes   Depression Screen PHQ 2/9 Scores 03/07/2019 04/23/2018 12/09/2016 09/07/2015  PHQ - 2 Score 0 2 0 2   PHQ- 9 Score - 8 - 9     Cognitive Function     6CIT Screen 03/07/2019  What Year? 0 points  What month? 0 points  What time? 0 points  Count back from 20 0 points  Months in reverse 0 points  Repeat phrase 2 points  Total Score 2    Immunization History  Administered Date(s) Administered  . Td 05/24/2001    Screening Tests Health Maintenance  Topic Date Due  . INFLUENZA VACCINE  07/22/2018  . MAMMOGRAM  02/27/2019  . PNA vac Low Risk Adult (1 of 2 - PCV13) 12/21/2019 (Originally 10/20/2006)  . TETANUS/TDAP  05/03/2022  . DEXA SCAN  Completed      Plan:      Ms. Garza , Thank you for taking time to come for your Medicare Wellness Visit. I appreciate your ongoing commitment to your health goals. Please review the following plan we discussed and let me know if I can assist you in the future.   Please schedule your next medicare wellness visit with me in 1 yr. Continue doing brain stimulating activities (puzzles, reading, adult coloring books, staying active) to keep memory sharp.    These are the goals we discussed: Goals    . Exercise 3x per week (30 min per time)     Get out and walk at least 3 times a week for 30 minutes at a time.    . Weight (lb) < 200 lb (90.7 kg)     Would like to loose at least 10 lbs if not more. 25 lbs.       This  is a list of the screening recommended for you and due dates:  Health Maintenance  Topic Date Due  . Flu Shot  07/22/2018  . Mammogram  02/27/2019  . Pneumonia vaccines (1 of 2 - PCV13) 12/21/2019*  . Tetanus Vaccine  05/03/2022  . DEXA scan (bone density measurement)  Completed  *Topic was postponed. The date shown is not the original due date.      I have personally reviewed and noted the following in the patient's chart:   . Medical and social history . Use of alcohol, tobacco or illicit drugs  . Current medications and supplements . Functional ability and status . Nutritional status . Physical  activity . Advanced directives . List of other physicians . Hospitalizations, surgeries, and ER visits in previous 12 months . Vitals . Screenings to include cognitive, depression, and falls . Referrals and appointments  In addition, I have reviewed and discussed with patient certain preventive protocols, quality metrics, and best practice recommendations. A written personalized care plan for preventive services as well as general preventive health recommendations were provided to patient.     Normand Sloop, LPN  1/60/7371

## 2019-03-03 ENCOUNTER — Other Ambulatory Visit: Payer: Self-pay

## 2019-03-03 NOTE — Telephone Encounter (Signed)
Opened in error

## 2019-03-04 NOTE — Telephone Encounter (Signed)
Rx(s) sent to pharmacy electronically.  

## 2019-03-07 ENCOUNTER — Other Ambulatory Visit: Payer: Self-pay

## 2019-03-07 ENCOUNTER — Ambulatory Visit (INDEPENDENT_AMBULATORY_CARE_PROVIDER_SITE_OTHER): Payer: Medicare HMO | Admitting: *Deleted

## 2019-03-07 VITALS — BP 130/51 | HR 69 | Ht 64.0 in | Wt 176.0 lb

## 2019-03-07 DIAGNOSIS — Z0000 Encounter for general adult medical examination without abnormal findings: Secondary | ICD-10-CM | POA: Diagnosis not present

## 2019-03-07 DIAGNOSIS — Z13820 Encounter for screening for osteoporosis: Secondary | ICD-10-CM

## 2019-03-07 DIAGNOSIS — Z1231 Encounter for screening mammogram for malignant neoplasm of breast: Secondary | ICD-10-CM | POA: Diagnosis not present

## 2019-03-07 MED ORDER — POTASSIUM CHLORIDE ER 20 MEQ PO TBCR: 20.0000 meq | EXTENDED_RELEASE_TABLET | Freq: Every day | ORAL | 2 refills | Status: DC

## 2019-03-07 NOTE — Patient Instructions (Addendum)
Rebekah Harrell , Thank you for taking time to come for your Medicare Wellness Visit. I appreciate your ongoing commitment to your health goals. Please review the following plan we discussed and let me know if I can assist you in the future.   Please schedule your next medicare wellness visit with me in 1 yr. Continue doing brain stimulating activities (puzzles, reading, adult coloring books, staying active) to keep memory sharp.   These are the goals we discussed: Goals    . Exercise 3x per week (30 min per time)     Get out and walk at least 3 times a week for 30 minutes at a time.    . Weight (lb) < 200 lb (90.7 kg)     Would like to loose at least 10 lbs if not more. 25 lbs.

## 2019-03-08 ENCOUNTER — Ambulatory Visit: Payer: Medicare HMO | Admitting: Family Medicine

## 2019-03-09 ENCOUNTER — Ambulatory Visit (INDEPENDENT_AMBULATORY_CARE_PROVIDER_SITE_OTHER): Payer: Medicare HMO

## 2019-03-09 ENCOUNTER — Ambulatory Visit: Payer: Medicare HMO

## 2019-03-09 ENCOUNTER — Ambulatory Visit (INDEPENDENT_AMBULATORY_CARE_PROVIDER_SITE_OTHER): Payer: Medicare HMO | Admitting: Family Medicine

## 2019-03-09 ENCOUNTER — Other Ambulatory Visit: Payer: Self-pay

## 2019-03-09 ENCOUNTER — Other Ambulatory Visit: Payer: Medicare HMO

## 2019-03-09 ENCOUNTER — Encounter: Payer: Self-pay | Admitting: Family Medicine

## 2019-03-09 VITALS — BP 123/65 | HR 52 | Ht 64.0 in | Wt 174.0 lb

## 2019-03-09 DIAGNOSIS — M85852 Other specified disorders of bone density and structure, left thigh: Secondary | ICD-10-CM | POA: Diagnosis not present

## 2019-03-09 DIAGNOSIS — M8589 Other specified disorders of bone density and structure, multiple sites: Secondary | ICD-10-CM

## 2019-03-09 DIAGNOSIS — Z1231 Encounter for screening mammogram for malignant neoplasm of breast: Secondary | ICD-10-CM | POA: Diagnosis not present

## 2019-03-09 DIAGNOSIS — M25561 Pain in right knee: Secondary | ICD-10-CM

## 2019-03-09 DIAGNOSIS — I10 Essential (primary) hypertension: Secondary | ICD-10-CM | POA: Diagnosis not present

## 2019-03-09 DIAGNOSIS — M25462 Effusion, left knee: Secondary | ICD-10-CM | POA: Diagnosis not present

## 2019-03-09 DIAGNOSIS — I480 Paroxysmal atrial fibrillation: Secondary | ICD-10-CM

## 2019-03-09 DIAGNOSIS — N898 Other specified noninflammatory disorders of vagina: Secondary | ICD-10-CM | POA: Diagnosis not present

## 2019-03-09 DIAGNOSIS — Z0000 Encounter for general adult medical examination without abnormal findings: Secondary | ICD-10-CM

## 2019-03-09 DIAGNOSIS — I700 Atherosclerosis of aorta: Secondary | ICD-10-CM

## 2019-03-09 MED ORDER — SIMVASTATIN 20 MG PO TABS: 20.0000 mg | ORAL_TABLET | Freq: Every evening | ORAL | 3 refills | Status: DC

## 2019-03-09 NOTE — Progress Notes (Signed)
Subjective:    CC:   HPI:  Hypertension- Pt denies chest pain, SOB, dizziness, or heart palpitations.  Taking meds as directed w/o problems.  Denies medication side effects.    She c/o of a clear vaginal discharge that is been going on for about 2 weeks.  She is also noticed a little bit of discomfort in the right lower quadrant area.  She says that it can feel a little itchy at times.  Not noticed any vaginal bleeding.  Also complains of some right knee pain x2 months.  She was going up some stairs and when she got to the top of the landing she turned right and then sort of twisted her knee she did not fall.  But she says it has been bothering her since then she denies any swelling of the joint.  She says if she is resting it it feels perfectly fine but when she does a lot of walking such as walking around [place] is when she will noticed that it bothers her more.  She even tried using her TENS unit a couple times but was not very helpful.  She has been using some heat but has not tried ice.  She is been using some Tylenol intermittently.  Paroxysmal AAfib- she is doing well. She is asymptomatic.    Past medical history, Surgical history, Family history not pertinant except as noted below, Social history, Allergies, and medications have been entered into the medical record, reviewed, and corrections made.   Review of Systems: No fevers, chills, night sweats, weight loss, chest pain, or shortness of breath.   Objective:    General: Well Developed, well nourished, and in no acute distress.  Neuro: Alert and oriented x3, extra-ocular muscles intact, sensation grossly intact.  HEENT: Normocephalic, atraumatic  Skin: Warm and dry, no rashes. Cardiac: Regular rate and rhythm, no murmurs rubs or gallops, no lower extremity edema.  Respiratory: Clear to auscultation bilaterally. Not using accessory muscles, speaking in full sentences. Abd: Soft, nontender.  Normal bowel sounds.  No  organomegaly. MSK: Right knee with normal flexion and extension.  No crepitus.  Nontender over the patella or patellar tendon.  She is tender over the medial lateral joint lines.  No increased laxity with anterior posterior drawer.  No significant discomfort with valgus or varus stress.  Negative McMurray's.  Impression and Recommendations:   HTN - Well controlled. Continue current regimen. Follow up in  6 month.  Due for up-to-date labs in fact she is overdue she will go today for that.  Paroxysmal atrial fibrillation-stable and currently asymptomatic.  Continue current regimen.  Vaginal discharge-wet prep performed.  Will call with results once available if negative then may need to have her return for pelvic exam.  Right knee pain-exam is fairly normal except for tenderness along the medial lateral joint lines.  No increased laxity suspect osteoarthritis with acute inflammation.  Continue with Tylenol we will give her handout for stretches.  We will get x-ray today as well though I am not as concerned about fracture without any type of fall or impact injury.  Can return and see 1 of our sports med docs if not improving over the next 3 to 4 weeks.  Aortic atherosclerosis-no recent chest pain.  He is currently on simvastatin 20 mg nightly.

## 2019-03-10 LAB — COMPLETE METABOLIC PANEL WITH GFR
AG Ratio: 1.5 (calc) (ref 1.0–2.5)
ALKALINE PHOSPHATASE (APISO): 75 U/L (ref 37–153)
ALT: 12 U/L (ref 6–29)
AST: 19 U/L (ref 10–35)
Albumin: 4.2 g/dL (ref 3.6–5.1)
BUN/Creatinine Ratio: 16 (calc) (ref 6–22)
BUN: 16 mg/dL (ref 7–25)
CHLORIDE: 105 mmol/L (ref 98–110)
CO2: 27 mmol/L (ref 20–32)
Calcium: 9.2 mg/dL (ref 8.6–10.4)
Creat: 0.97 mg/dL — ABNORMAL HIGH (ref 0.60–0.93)
GFR, Est African American: 65 mL/min/{1.73_m2} (ref >=60)
GFR, Est Non African American: 56 mL/min/{1.73_m2} — ABNORMAL LOW (ref >=60)
Globulin: 2.8 g/dL (calc) (ref 1.9–3.7)
Glucose, Bld: 81 mg/dL (ref 65–99)
Potassium: 4.7 mmol/L (ref 3.5–5.3)
Sodium: 140 mmol/L (ref 135–146)
Total Bilirubin: 0.5 mg/dL (ref 0.2–1.2)
Total Protein: 7 g/dL (ref 6.1–8.1)

## 2019-03-10 LAB — WET PREP FOR TRICH, YEAST, CLUE
MICRO NUMBER: 333658
Specimen Quality: ADEQUATE

## 2019-03-10 LAB — CBC
HCT: 40.6 % (ref 35.0–45.0)
Hemoglobin: 13.6 g/dL (ref 11.7–15.5)
MCH: 29.2 pg (ref 27.0–33.0)
MCHC: 33.5 g/dL (ref 32.0–36.0)
MCV: 87.1 fL (ref 80.0–100.0)
MPV: 10.8 fL (ref 7.5–12.5)
Platelets: 256 10*3/uL (ref 140–400)
RBC: 4.66 10*6/uL (ref 3.80–5.10)
RDW: 13 % (ref 11.0–15.0)
WBC: 9 10*3/uL (ref 3.8–10.8)

## 2019-03-10 LAB — TSH: TSH: 3.2 mIU/L (ref 0.40–4.50)

## 2019-03-10 LAB — LIPID PANEL
CHOLESTEROL: 175 mg/dL (ref <200)
HDL: 48 mg/dL — ABNORMAL LOW (ref >=50)
LDL Cholesterol (Calc): 93 mg/dL (calc)
Non-HDL Cholesterol (Calc): 127 mg/dL (calc) (ref <130)
Total CHOL/HDL Ratio: 3.6 (calc) (ref <5.0)
Triglycerides: 242 mg/dL — ABNORMAL HIGH (ref <150)

## 2019-03-10 MED ORDER — METRONIDAZOLE 500 MG PO TABS: 500.0000 mg | ORAL_TABLET | Freq: Two times a day (BID) | ORAL | 0 refills | Status: DC

## 2019-03-10 NOTE — Addendum Note (Signed)
Addended by: Nani Gasser D on: 03/10/2019 07:49 AM   Modules accepted: Orders

## 2019-03-29 ENCOUNTER — Telehealth: Payer: Self-pay | Admitting: *Deleted

## 2019-03-29 NOTE — Telephone Encounter (Signed)
Pt lvm stating that we sent her maintenance medications to her Mail order pharmacy for a 30 day supply (Sotalol, Amlodipine,Hydralazine) these were sent by Dr. Ludwig Clarks office and in the note she was to schedule an OV for a 90 day supply to be sent in. She also said that the Ranitidine was on recall (this is a historical medication) I advised her to let us know what she can take and we could send this to the pharmacy of her choice.Laureen Ochs, Viann Shove, CMA

## 2019-03-29 NOTE — Telephone Encounter (Signed)
Left voice message for patient to call the office to verify medication request.

## 2019-03-30 ENCOUNTER — Other Ambulatory Visit: Payer: Self-pay | Admitting: Cardiology

## 2019-03-30 DIAGNOSIS — I10 Essential (primary) hypertension: Secondary | ICD-10-CM

## 2019-03-30 MED ORDER — AMLODIPINE BESYLATE 5 MG PO TABS: 5.0000 mg | ORAL_TABLET | Freq: Every day | ORAL | 0 refills | Status: DC

## 2019-03-30 MED ORDER — HYDRALAZINE HCL 50 MG PO TABS: 50.0000 mg | ORAL_TABLET | Freq: Three times a day (TID) | ORAL | 0 refills | Status: DC

## 2019-03-30 MED ORDER — SOTALOL HCL 80 MG PO TABS: 80.0000 mg | ORAL_TABLET | Freq: Two times a day (BID) | ORAL | 0 refills | Status: DC

## 2019-03-30 NOTE — Telephone Encounter (Signed)
Pt calling to clarify which medications that she needed refilled. I explained to the pt that she needed to schedule an overdue appt before anymore refills and that I was sending a 90 day supply to [facility] mail order pharmacy and that she was overdue for a yearly appt with Dr. Jens Som and that she had not been seen since 2018. Pt stated that she would call back to schedule an appt. Pt verbalized understanding.

## 2019-04-13 ENCOUNTER — Ambulatory Visit (INDEPENDENT_AMBULATORY_CARE_PROVIDER_SITE_OTHER): Payer: Medicare HMO | Admitting: Family Medicine

## 2019-04-13 ENCOUNTER — Encounter: Payer: Self-pay | Admitting: Family Medicine

## 2019-04-13 VITALS — BP 138/66 | HR 56 | Temp 98.3°F | Ht 64.0 in | Wt 176.0 lb

## 2019-04-13 DIAGNOSIS — K2100 Gastro-esophageal reflux disease with esophagitis, without bleeding: Secondary | ICD-10-CM

## 2019-04-13 DIAGNOSIS — R300 Dysuria: Secondary | ICD-10-CM

## 2019-04-13 DIAGNOSIS — R0789 Other chest pain: Secondary | ICD-10-CM | POA: Diagnosis not present

## 2019-04-13 DIAGNOSIS — I480 Paroxysmal atrial fibrillation: Secondary | ICD-10-CM

## 2019-04-13 DIAGNOSIS — N898 Other specified noninflammatory disorders of vagina: Secondary | ICD-10-CM | POA: Diagnosis not present

## 2019-04-13 LAB — POCT URINALYSIS DIPSTICK
Bilirubin, UA: NEGATIVE
Glucose, UA: NEGATIVE
Ketones, UA: NEGATIVE
Nitrite, UA: NEGATIVE
Protein, UA: NEGATIVE
Spec Grav, UA: <=1.005 — AB (ref 1.010–1.025)
Urobilinogen, UA: 0.2 E.U./dL
pH, UA: 6 (ref 5.0–8.0)

## 2019-04-13 LAB — WET PREP FOR TRICH, YEAST, CLUE
MICRO NUMBER:: 413704
Specimen Quality: ADEQUATE

## 2019-04-13 MED ORDER — FAMOTIDINE 20 MG PO TABS: 20.0000 mg | ORAL_TABLET | Freq: Two times a day (BID) | ORAL | 1 refills | Status: DC

## 2019-04-13 NOTE — Progress Notes (Signed)
Acute Office Visit  Subjective:    Patient ID: Rebekah Harrell, female    DOB: [birth year], 78 y.o.   MRN: [id number]  Chief Complaint  Patient presents with  . Vaginal Discharge    she said that she had gotten better from the last time that she was treated for this and about 3 days later it  . Atrial Fibrillation    pt reports that 3 days ago she experienced an irregular HB she checked her BP and her it showed that her pulse was irregular and this concerned her    HPI Patient is in today for vaginal discharge and burning.  She had symptoms when I saw her 4 weeks ago. Swab was positive for BV and she was treated.  She says the medication worked well but within 2-3 days of finishing the medication she started to get increase in discharge. Then when it gets on her skin it causes it to feel raw and burn. No itching.   Pt reports that 3 days ago she experienced what she felt was severe heartburn.  She says this was really unusual for her because she takes her omeprazole regularly.  But she had been eating spaghetti Posta for several days in a row that she had gotten on sale from [employer].  She said she also felt like she had a lot of gas at the same time.  She was finally able to belch and got some relief.  But says that she has had a little soreness in her chest ever since then.  Her last follow-up with cardiology was about 16 months ago.  She says she did check her blood pressure when this happened and it was 139/52.  She says her pulse was actually in the normal range but her machine noted that her pulse was irregular.  She was concerned that she might be back in A. fib again.  She was initially diagnosed with A. fib in 2017 and has actually done really well since then and has remained in sinus rhythm at least as far as we are aware.  GERD-she also wanted to discuss her Zantac.  She received a letter saying that have been recalled and wanted to know what could be substituted and preferred to  have it sent to her mail order pharmacy.  Past Medical History:  Diagnosis Date  . Aortic atherosclerosis (HCC)   . Arthritis    "lower back; knees" (11/26/2016)  . Asthma   . Chronic atrial fibrillation   . CKD (chronic kidney disease), stage III [facility])    patient unaware of this on 11/26/2016  . Complication of anesthesia    "had trouble waking me up in the late 1990's after gallbladder OR"  . GERD (gastroesophageal reflux disease)   . Heart murmur    "dx'd when I was a little kid"  . History of hiatal hernia   . Hyperlipidemia   . Hypertension   . Hypoglycemia     Past Surgical History:  Procedure Laterality Date  . CHOLECYSTECTOMY OPEN  1990s  . TONSILLECTOMY    . TUBAL LIGATION      Family History  Problem Relation Age of Onset  . CAD Mother        HEART ATTACK  . Colon cancer Father        KIDNEY FAILURE/COLON CANCER  . Atrial fibrillation Brother   . Heart failure Maternal Grandmother   . Kidney failure Maternal Grandfather   . Heart attack Paternal  Grandmother   . Healthy Brother     Social History   Socioeconomic History  . Marital status: Divorced    Spouse name: Not on file  . Number of children: 3  . Years of education: 68  . Highest education level: Associate degree: academic program  Occupational History  . 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 Financial resource strain: Not very hard  . Food insecurity:    Worry: Never true    Inability: Never true  . Transportation needs:    Medical: No    Non-medical: No  Tobacco Use  . Smoking status: Never Smoker  . Smokeless tobacco: Never Used  Substance and Sexual Activity  . Alcohol use: Not Currently  . Drug use: No  . Sexual activity: Not Currently  Lifestyle  . Physical activity:    Days per week: 0 days    Minutes per session: 0 min  . Stress: Not at all  Relationships  . Social connections:    Talks on phone: More than three times a week     Gets together: Once a week    Attends religious service: Never    Active member of club or organization: No    Attends meetings of clubs or organizations: Never    Relationship status: Divorced  . Intimate partner violence:    Fear of current or ex partner: No    Emotionally abused: No    Physically abused: No    Forced sexual activity: No  Other Topics Concern  . 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 Drinks tea during the day.    Outpatient Medications Prior to Visit  Medication Sig Dispense Refill  . AMBULATORY NON FORMULARY MEDICATION Medication Name: allergy medication    . amLODipine (NORVASC) 5 MG tablet Take 1 tablet (5 mg total) by mouth daily. Please make overdue appt with Dr. Jens Som before anymore refills. 3rd and Final Attempt 90 tablet 0  . CALCIUM PO Take 1 tablet by mouth at bedtime.    . cetirizine (ZYRTEC) 10 MG tablet     . Cholecalciferol (D-3-5) 5000 units capsule Take 5,000 Units by mouth daily.    . hydrALAZINE (APRESOLINE) 50 MG tablet Take 1 tablet (50 mg total) by mouth 3 (three) times daily. Please make overdue appt with Dr. Jens Som before anymore refills. 3rd and Final Attempt 90 tablet 0  . Magnesium 200 MG TABS Take 1 tablet (200 mg total) by mouth daily. 60 each   . Multiple Vitamins-Minerals (MULTIVITAMIN PO) Take 1 tablet by mouth at bedtime.    Marland Kitchen omeprazole (PRILOSEC) 20 MG capsule TAKE 1 CAPSULE EVERY DAY 90 capsule 3  . Potassium Chloride ER 20 MEQ TBCR Take 20 mEq by mouth daily. 30 tablet 2  . simvastatin (ZOCOR) 20 MG tablet Take 1 tablet (20 mg total) by mouth every evening. Must schedule yearly exam and labs for refills 90 tablet 3  . sotalol (BETAPACE) 80 MG tablet Take 1 tablet (80 mg total) by mouth 2 (two) times daily. Please make overdue appt with Dr. Jens Som before anymore refills. 3rd and Final Attempt 180 tablet 0  . traMADol (ULTRAM) 50 MG tablet Take 1 tablet (50  mg total) by mouth every 8 (eight) hours as needed. 15 tablet 0  . metroNIDAZOLE (FLAGYL) 500 MG tablet Take 1 tablet (500 mg total) by  mouth 2 (two) times daily. 14 tablet 0   No facility-administered medications prior to visit.     Allergies  Allergen Reactions  . Ace Inhibitors Swelling  . Tramadol Other (See Comments)    Night sweats, bad dreams  . Celecoxib Other (See Comments)    REACTION: hair loss  . Cetirizine Hcl Other (See Comments)    REACTION: fatigue  . Penicillins Other (See Comments)    Unknown  . Sulfonamide Derivatives Rash    ROS     Objective:    Physical Exam  Constitutional: She is oriented to person, place, and time. She appears well-developed and well-nourished.  HENT:  Head: Normocephalic and atraumatic.  Cardiovascular: Normal rate, regular rhythm and normal heart sounds.  Pulmonary/Chest: Effort normal and breath sounds normal.  Genitourinary: There is no rash or lesion on the right labia. There is no rash or lesion on the left labia. Cervix exhibits no discharge. Right adnexum displays no mass. Left adnexum displays no mass and no tenderness.    Vaginal erythema present.     No vaginal bleeding.  There is erythema in the vagina. No bleeding in the vagina.    Genitourinary Comments: External vaginal area is erythematous. In side the vaginal on the right latera wall she has what looks and feels like 2 larger polyps.    Neurological: She is alert and oriented to person, place, and time.  Skin: Skin is warm and dry.  Psychiatric: She has a normal mood and affect. Her behavior is normal.    BP 138/66   Pulse (!) 56   Temp 98.3 F (36.8 C)   Ht 5\' 4"  (1.626 m)   Wt 176 lb (79.8 kg)   SpO2 98%   BMI 30.21 kg/m  Wt Readings from Last 3 Encounters:  04/13/19 176 lb (79.8 kg)  03/09/19 174 lb (78.9 kg)  03/07/19 176 lb (79.8 kg)    There are no preventive care reminders to display for this patient.  There are no preventive care reminders to  display for this patient.   Lab Results  Component Value Date   TSH 3.20 03/09/2019   Lab Results  Component Value Date   WBC 9.0 03/09/2019   HGB 13.6 03/09/2019   HCT 40.6 03/09/2019   MCV 87.1 03/09/2019   PLT 256 03/09/2019   Lab Results  Component Value Date   NA 140 03/09/2019   K 4.7 03/09/2019   CO2 27 03/09/2019   GLUCOSE 81 03/09/2019   BUN 16 03/09/2019   CREATININE 0.97 (H) 03/09/2019   BILITOT 0.5 03/09/2019   ALKPHOS 64 11/18/2017   AST 19 03/09/2019   ALT 12 03/09/2019   PROT 7.0 03/09/2019   ALBUMIN 4.3 11/18/2017   CALCIUM 9.2 03/09/2019   ANIONGAP 8 12/10/2016   Lab Results  Component Value Date   CHOL 175 03/09/2019   Lab Results  Component Value Date   HDL 48 (L) 03/09/2019   Lab Results  Component Value Date   LDLCALC 93 03/09/2019   Lab Results  Component Value Date   TRIG 242 (H) 03/09/2019   Lab Results  Component Value Date   CHOLHDL 3.6 03/09/2019   Lab Results  Component Value Date   HGBA1C 5.8 (H) 06/19/2016       Assessment & Plan:   Problem List Items Addressed This Visit      Cardiovascular and Mediastinum   Atrial fibrillation (HCC)   Relevant Orders   EKG 12-Lead  Other Visit Diagnoses    Vaginal discharge    -  Primary   Relevant Orders   WET PREP FOR TRICH, YEAST, CLUE   POCT urinalysis dipstick (Completed)   Dysuria       Relevant Orders   POCT urinalysis dipstick (Completed)   Atypical chest pain       Gastroesophageal reflux disease with esophagitis       Vaginal lesion       Relevant Orders   Ambulatory referral to Obstetrics / Gynecology     Atrial fibrillation-EKG today shows that she is actually in normal sinus rhythm with a rate of 55 bpm.  No acute ST-T wave changes.  The intensity of the pain has actually resolved.  I did encourage her to call Dr. Ludwig Clarksrenshaw's office and schedule her follow-up appointment soon.  I just make sure he does not want to do any additional testing I do not think  that she has had an MI and gave her reassurance I do think this was probably GERD related as she had a major shift in her diet and seems to be feeling better.  Atypical chest pain-likely related to GERD and gastritis.  Continue with PPI.  We will also work on switching her ranitidine to famotidine since the Zantac was pulled from the market.  We also discussed eating a very bland diet and avoiding greasy, spicy, acidic foods.  Avoiding caffeine.  She says she does drink tea a lot.  And call if symptoms do not improve or recur.  Vaginal discharge-4 weeks ago she was positive for bacterial vaginosis.  Will repeat swab today and will call with results.  If symptoms recur a third time then we will do the more sensitive swab but it is quite expensive and her co-pay would be much higher for that.  We did discuss doing that as an option today as well but opted for the less expensive wet prep.  Will call with results once available.  Vaginal polyps-on exam it seemed like she had some vaginal wall polyp some not sure if these are of any concern or not.  She said she is never been told that she has these.  She denies any tears while giving birth.  I am to refer her to her GYN down the hall to just further examine these and see if they need to be biopsied.  Consider it could also be a source of the increased discharge.   GERD-see note above.  We will switch to famotidine.  Continue PPI as well.  Dyuris - + leuk. Will send cx.   Meds ordered this encounter  Medications  . famotidine (PEPCID) 20 MG tablet    Sig: Take 1 tablet (20 mg total) by mouth 2 (two) times daily.    Dispense:  180 tablet    Refill:  1     Nani Gasseratherine Bayla Mcgovern, MD

## 2019-04-13 NOTE — Addendum Note (Signed)
Addended by: Deno Etienne on: 04/13/2019 03:59 PM   Modules accepted: Orders

## 2019-04-14 LAB — URINE CULTURE
MICRO NUMBER:: 413917
Result:: NO GROWTH
SPECIMEN QUALITY:: ADEQUATE

## 2019-04-28 ENCOUNTER — Other Ambulatory Visit: Payer: Self-pay | Admitting: Family Medicine

## 2019-06-01 ENCOUNTER — Other Ambulatory Visit: Payer: Self-pay | Admitting: Family Medicine

## 2019-06-29 ENCOUNTER — Other Ambulatory Visit: Payer: Self-pay | Admitting: Cardiology

## 2019-07-02 DIAGNOSIS — I10 Essential (primary) hypertension: Secondary | ICD-10-CM | POA: Diagnosis not present

## 2019-07-02 DIAGNOSIS — Z880 Allergy status to penicillin: Secondary | ICD-10-CM | POA: Diagnosis not present

## 2019-07-02 DIAGNOSIS — S91012A Laceration without foreign body, left ankle, initial encounter: Secondary | ICD-10-CM | POA: Diagnosis not present

## 2019-07-02 DIAGNOSIS — Z791 Long term (current) use of non-steroidal anti-inflammatories (NSAID): Secondary | ICD-10-CM | POA: Diagnosis not present

## 2019-07-02 DIAGNOSIS — G8911 Acute pain due to trauma: Secondary | ICD-10-CM | POA: Diagnosis not present

## 2019-07-02 DIAGNOSIS — Z23 Encounter for immunization: Secondary | ICD-10-CM | POA: Diagnosis not present

## 2019-07-02 DIAGNOSIS — I4891 Unspecified atrial fibrillation: Secondary | ICD-10-CM | POA: Diagnosis not present

## 2019-07-02 DIAGNOSIS — Z882 Allergy status to sulfonamides status: Secondary | ICD-10-CM | POA: Diagnosis not present

## 2019-07-02 DIAGNOSIS — Z79899 Other long term (current) drug therapy: Secondary | ICD-10-CM | POA: Diagnosis not present

## 2019-07-02 DIAGNOSIS — S81812A Laceration without foreign body, left lower leg, initial encounter: Secondary | ICD-10-CM | POA: Diagnosis not present

## 2019-07-02 DIAGNOSIS — E7800 Pure hypercholesterolemia, unspecified: Secondary | ICD-10-CM | POA: Diagnosis not present

## 2019-07-06 NOTE — Progress Notes (Signed)
Virtual Visit via Video Note changed to phone visit at patient request.   This visit type was conducted due to national recommendations for restrictions regarding the COVID-19 Pandemic (e.g. social distancing) in an effort to limit this patient's exposure and mitigate transmission in our community.  Due to her co-morbid illnesses, this patient is at least at moderate risk for complications without adequate follow up.  This format is felt to be most appropriate for this patient at this time.  All issues noted in this document were discussed and addressed.  A limited physical exam was performed with this format.  Please refer to the patient's chart for her consent to telehealth for [name].   Date:  07/11/2019   ID:  Rebekah Harrell, DOB 24-Feb-1941, MRN [id number]  Patient Location:Home Provider Location: Home  PCP:  Agapito GamesMetheney, [name] D, MD  Cardiologist:  Dr Jens Somrenshaw  Evaluation Performed:  Follow-Up Visit  Chief Complaint:  FU atrial fibrillation  History of Present Illness:    FU atrial fibrillation. Patient with new diagnosis of atrial fibrillation in June 2017. Stress echocardiogram 2011 showed poor exercise tolerance but no ischemia. Laboratories June 2017 showed TSH 2.49.Admitted December 2017 with atrial fibrillation with rapid ventricular response and acute diastolic congestive heart failure. Echo December 2017 showed normal LV systolic function, elevated left ventricular filling pressures, mild mitral regurgitation and mild biatrial enlargement. Patient was placed on sotalol and converted to sinus rhythm. Sotalol dose was decreased to 80 mg twice a day because of prolonged QT interval. Metoprolol was discontinued because of bradycardia. Developed angioedema with lisinopril. Since last seen,there is no CP, dyspnea, or palpitations.  The patient does not have symptoms concerning for COVID-19 infection (fever, chills, cough, or new shortness of breath).    Past Medical  History:  Diagnosis Date  . Aortic atherosclerosis (HCC)   . Arthritis    "lower back; knees" (11/26/2016)  . Asthma   . Chronic atrial fibrillation   . CKD (chronic kidney disease), stage III [name](HCC)    patient unaware of this on 11/26/2016  . Complication of anesthesia    "had trouble waking me up in the late 1990's after gallbladder OR"  . GERD (gastroesophageal reflux disease)   . Heart murmur    "dx'd when I was a little kid"  . History of hiatal hernia   . Hyperlipidemia   . Hypertension   . Hypoglycemia    Past Surgical History:  Procedure Laterality Date  . CHOLECYSTECTOMY OPEN  1990s  . TONSILLECTOMY    . TUBAL LIGATION       Current Meds  Medication Sig  . AMBULATORY NON FORMULARY MEDICATION Medication Name: allergy medication  . amLODipine (NORVASC) 5 MG tablet Take 1 tablet (5 mg total) by mouth daily. Please make overdue appt with Dr. Jens Somrenshaw before anymore refills. 3rd and Final Attempt  . CALCIUM PO Take 1 tablet by mouth at bedtime.  . cetirizine (ZYRTEC) 10 MG tablet   . Cholecalciferol (D-3-5) 5000 units capsule Take 5,000 Units by mouth daily.  . famotidine (PEPCID) 20 MG tablet Take 1 tablet (20 mg total) by mouth 2 (two) times daily.  . hydrALAZINE (APRESOLINE) 50 MG tablet Take 1 tablet (50 mg total) by mouth 3 (three) times daily. Please make overdue appt with Dr. Jens Somrenshaw before anymore refills. 3rd and Final Attempt  . Magnesium 200 MG TABS Take 1 tablet (200 mg total) by mouth daily.  . Multiple Vitamins-Minerals (MULTIVITAMIN PO) Take 1 tablet by mouth at  bedtime.  Marland Kitchen omeprazole (PRILOSEC) 20 MG capsule TAKE 1 CAPSULE EVERY DAY  . Potassium Chloride ER 20 MEQ TBCR Take 20 mEq by mouth daily.  . potassium chloride SA (K-DUR) 20 MEQ tablet TAKE 1 TABLET EVERY DAY  . simvastatin (ZOCOR) 20 MG tablet Take 1 tablet (20 mg total) by mouth every evening. Must schedule yearly exam and labs for refills  . sotalol (BETAPACE) 80 MG tablet Take 1 tablet (80 mg  total) by mouth 2 (two) times daily. Please make overdue appt with Dr. Stanford Breed before anymore refills. 3rd and Final Attempt  . traMADol (ULTRAM) 50 MG tablet Take 1 tablet (50 mg total) by mouth every 8 (eight) hours as needed.     Allergies:   Ace inhibitors, Tramadol, Celecoxib, Cetirizine hcl, Penicillins, and Sulfonamide derivatives   Social History   Tobacco Use  . Smoking status: Never Smoker  . Smokeless tobacco: Never Used  Substance Use Topics  . Alcohol use: Not Currently  . Drug use: No     Family Hx: The patient's family history includes Atrial fibrillation in her brother; CAD in her mother; Colon cancer in her father; Healthy in her brother; Heart attack in her paternal grandmother; Heart failure in her maternal grandmother; Kidney failure in her maternal grandfather.  ROS:   Please see the history of present illness.    No Fever, chills  or productive cough All other systems reviewed and are negative.   Recent Labs: 03/09/2019: ALT 12; BUN 16; Creat 0.97; Hemoglobin 13.6; Platelets 256; Potassium 4.7; Sodium 140; TSH 3.20   Recent Lipid Panel Lab Results  Component Value Date/Time   CHOL 175 03/09/2019 11:17 AM   CHOL 148 11/18/2017 12:05 PM   TRIG 242 (H) 03/09/2019 11:17 AM   HDL 48 (L) 03/09/2019 11:17 AM   HDL 38 (L) 11/18/2017 12:05 PM   CHOLHDL 3.6 03/09/2019 11:17 AM   LDLCALC 93 03/09/2019 11:17 AM    Wt Readings from Last 3 Encounters:  07/11/19 174 lb (78.9 kg)  04/13/19 176 lb (79.8 kg)  03/09/19 174 lb (78.9 kg)     Objective:    Vital Signs:  BP (!) 149/59   Pulse (!) 56   Ht 5\' 4"  (1.626 m)   Wt 174 lb (78.9 kg)   BMI 29.87 kg/m    VITAL SIGNS:  reviewed NAD Answers questions appropriately Normal affect Remainder of physical examination not performed (telehealth visit; coronavirus pandemic)  ASSESSMENT & PLAN:    1. PAF-pt remains in sinus based on history; continue sotalol (was reduced previously due to long QT).  She is not  taking Eliquis due to expense.  I discussed the importance of anticoagulation in decreasing stroke in patients with PAF.  I have provided the names of Xarelto and apixaban and she will check with her insurance company to see if affordable.  I also discussed the possibility of Coumadin.  She will contact us if she is agreeable to anticoagulation. 2. Hypertension-BP elevated; increase amlodipine to 10 mg daily and follow. 3. Hyperlipidemia-continue statin 4. Chronic diastolic CHF-continue Lasix as needed.  Renal function and potassium monitored by primary care.  COVID-19 Education: The importance of social distancing was discussed today.  Time:   Today, I have spent 17 minutes with the patient with telehealth technology discussing the above problems.     Medication Adjustments/Labs and Tests Ordered: Current medicines are reviewed at length with the patient today.  Concerns regarding medicines are outlined above.   Tests Ordered:  No orders of the defined types were placed in this encounter.   Medication Changes: No orders of the defined types were placed in this encounter.   Follow Up:  Virtual Visit or In Person in 1 year(s)  Signed, Olga MillersBrian Crenshaw, MD  07/11/2019 10:33 AM    [facility]

## 2019-07-11 ENCOUNTER — Telehealth (INDEPENDENT_AMBULATORY_CARE_PROVIDER_SITE_OTHER): Payer: Medicare HMO | Admitting: Cardiology

## 2019-07-11 ENCOUNTER — Encounter: Payer: Self-pay | Admitting: Cardiology

## 2019-07-11 VITALS — BP 149/59 | HR 56 | Ht 64.0 in | Wt 174.0 lb

## 2019-07-11 DIAGNOSIS — I5032 Chronic diastolic (congestive) heart failure: Secondary | ICD-10-CM | POA: Diagnosis not present

## 2019-07-11 DIAGNOSIS — I480 Paroxysmal atrial fibrillation: Secondary | ICD-10-CM | POA: Diagnosis not present

## 2019-07-11 DIAGNOSIS — I10 Essential (primary) hypertension: Secondary | ICD-10-CM | POA: Diagnosis not present

## 2019-07-11 MED ORDER — SOTALOL HCL 80 MG PO TABS: 80.0000 mg | ORAL_TABLET | Freq: Two times a day (BID) | ORAL | 3 refills | Status: DC

## 2019-07-11 MED ORDER — AMLODIPINE BESYLATE 10 MG PO TABS: 10.0000 mg | ORAL_TABLET | Freq: Every day | ORAL | 3 refills | Status: DC

## 2019-07-11 NOTE — Patient Instructions (Signed)
Medication Instructions:  INCREASE AMLODIPINE TO 10 MG ONCE DAILY= 2 OF THE 5 MG TABLETS ONCE DAILY If you need a refill on your cardiac medications before your next appointment, please call your pharmacy.   Lab work: If you have labs (blood work) drawn today and your tests are completely normal, you will receive your results only by: . MyChart Message (if you have MyChart) OR . A paper copy in the mail If you have any lab test that is abnormal or we need to change your treatment, we will call you to review the results.  Follow-Up: At [facility], you and your health needs are our priority.  As part of our continuing mission to provide you with exceptional heart care, we have created designated Provider Care Teams.  These Care Teams include your primary Cardiologist (physician) and Advanced Practice Providers (APPs -  Physician Assistants and Nurse Practitioners) who all work together to provide you with the care you need, when you need it. You will need a follow up appointment in 12 months.  Please call our office 2 months in advance to schedule this appointment.  You may see BRIAN CRENSHAW MD or one of the following Advanced Practice Providers on your designated Care Team:   Luke Kilroy, PA-C Krista Kroeger, PA-C . Callie Goodrich, PA-C     

## 2019-07-13 ENCOUNTER — Other Ambulatory Visit: Payer: Self-pay

## 2019-07-13 ENCOUNTER — Encounter: Payer: Self-pay | Admitting: Family Medicine

## 2019-07-13 ENCOUNTER — Ambulatory Visit (INDEPENDENT_AMBULATORY_CARE_PROVIDER_SITE_OTHER): Payer: Medicare HMO | Admitting: Family Medicine

## 2019-07-13 VITALS — BP 154/66 | HR 58 | Ht 64.0 in | Wt 177.0 lb

## 2019-07-13 DIAGNOSIS — L508 Other urticaria: Secondary | ICD-10-CM | POA: Diagnosis not present

## 2019-07-13 DIAGNOSIS — S91012A Laceration without foreign body, left ankle, initial encounter: Secondary | ICD-10-CM

## 2019-07-13 DIAGNOSIS — Z4802 Encounter for removal of sutures: Secondary | ICD-10-CM | POA: Diagnosis not present

## 2019-07-13 MED ORDER — MONTELUKAST SODIUM 10 MG PO TABS: 10.0000 mg | ORAL_TABLET | Freq: Every day | ORAL | 1 refills | Status: DC

## 2019-07-13 MED ORDER — FEXOFENADINE HCL 180 MG PO TABS: 180.0000 mg | ORAL_TABLET | Freq: Every day | ORAL | 1 refills | Status: DC

## 2019-07-13 NOTE — Patient Instructions (Signed)
Apply vaseline twice a day for 2 weeks.  No alcohol or peroxide.

## 2019-07-13 NOTE — Progress Notes (Signed)
Established Patient Office Visit  Subjective:  Patient ID: Rebekah Harrell, female    DOB: [birth year]  Age: 78 y.o. MRN: [id number]  CC:  Chief Complaint  Patient presents with  . Suture / Staple Removal    HPI [name] presents for suture removal.  She had a left medial ankle laceration on July 11 where she had tried to lift a garbage bag into the dumpster and it swung back and struck her.  Went to the emergency department.     She had 7 sutures placed. She has doing well. No sign of infection of fever. She has been keeping it covered and has been applying Neosporin.    In regards to her allergies she continues to get intermittent swelling of her tongue and sometimes her hands and feet.  She said she finally just had to stop the cetirizine because it was overly sedating but it did seem to help control the occasional swelling flares that she was experiencing.  She has seen the allergist previously.  Past Medical History:  Diagnosis Date  . Aortic atherosclerosis ([place])   . Arthritis    "lower back; knees" (11/26/2016)  . Asthma   . Chronic atrial fibrillation   . CKD (chronic kidney disease), stage III [facility])    patient unaware of this on 11/26/2016  . Complication of anesthesia    "had trouble waking me up in the late 1990's after gallbladder OR"  . GERD (gastroesophageal reflux disease)   . Heart murmur    "dx'd when I was a little kid"  . History of hiatal hernia   . Hyperlipidemia   . Hypertension   . Hypoglycemia     Past Surgical History:  Procedure Laterality Date  . CHOLECYSTECTOMY OPEN  1990s  . TONSILLECTOMY    . TUBAL LIGATION      Family History  Problem Relation Age of Onset  . CAD Mother        HEART ATTACK  . Colon cancer Father        KIDNEY FAILURE/COLON CANCER  . Atrial fibrillation Brother   . Heart failure Maternal Grandmother   . Kidney failure Maternal Grandfather   . Heart attack Paternal Grandmother   . Healthy Brother     Social  History   Socioeconomic History  . Marital status: Divorced    Spouse name: Not on file  . Number of children: 3  . Years of education: 80  . Highest education level: Associate degree: academic program  Occupational History  . 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 Financial resource strain: Not very hard  . Food insecurity    Worry: Never true    Inability: Never true  . Transportation needs    Medical: No    Non-medical: No  Tobacco Use  . Smoking status: Never Smoker  . Smokeless tobacco: Never Used  Substance and Sexual Activity  . Alcohol use: Not Currently  . Drug use: No  . Sexual activity: Not Currently  Lifestyle  . Physical activity    Days per week: 0 days    Minutes per session: 0 min  . Stress: Not at all  Relationships  . Social connections    Talks on phone: More than three times a week    Gets together: Once a week    Attends religious service: Never    Active member of club or organization: No    Attends  meetings of clubs or organizations: Never    Relationship status: Divorced  . Intimate partner violence    Fear of current or ex partner: No    Emotionally abused: No    Physically abused: No    Forced sexual activity: No  Other Topics Concern  . 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 Drinks tea during the day.    Outpatient Medications Prior to Visit  Medication Sig Dispense Refill  . AMBULATORY NON FORMULARY MEDICATION Medication Name: allergy medication    . amLODipine (NORVASC) 10 MG tablet Take 1 tablet (10 mg total) by mouth daily. 90 tablet 3  . CALCIUM PO Take 1 tablet by mouth at bedtime.    . Cholecalciferol (D-3-5) 5000 units capsule Take 5,000 Units by mouth daily.    . famotidine (PEPCID) 20 MG tablet Take 1 tablet (20 mg total) by mouth 2 (two) times daily. 180 tablet 1  . hydrALAZINE (APRESOLINE) 50 MG tablet Take 1  tablet (50 mg total) by mouth 3 (three) times daily. Please make overdue appt with Dr. Jens Somrenshaw before anymore refills. 3rd and Final Attempt 90 tablet 0  . Magnesium 200 MG TABS Take 1 tablet (200 mg total) by mouth daily. 60 each   . Multiple Vitamins-Minerals (MULTIVITAMIN PO) Take 1 tablet by mouth at bedtime.    Marland Kitchen. omeprazole (PRILOSEC) 20 MG capsule TAKE 1 CAPSULE EVERY DAY 90 capsule 1  . Potassium Chloride ER 20 MEQ TBCR Take 20 mEq by mouth daily. 30 tablet 2  . potassium chloride SA (K-DUR) 20 MEQ tablet TAKE 1 TABLET EVERY DAY 90 tablet 1  . simvastatin (ZOCOR) 20 MG tablet Take 1 tablet (20 mg total) by mouth every evening. Must schedule yearly exam and labs for refills 90 tablet 3  . sotalol (BETAPACE) 80 MG tablet Take 1 tablet (80 mg total) by mouth 2 (two) times daily. 180 tablet 3  . cetirizine (ZYRTEC) 10 MG tablet     . traMADol (ULTRAM) 50 MG tablet Take 1 tablet (50 mg total) by mouth every 8 (eight) hours as needed. 15 tablet 0   No facility-administered medications prior to visit.     Allergies  Allergen Reactions  . Ace Inhibitors Swelling  . Tramadol Other (See Comments)    Night sweats, bad dreams  . Celecoxib Other (See Comments)    REACTION: hair loss  . Cetirizine Hcl Other (See Comments)    REACTION: fatigue  . Penicillins Other (See Comments)    Unknown  . Sulfonamide Derivatives Rash    ROS Review of Systems    Objective:    Physical Exam  Constitutional: She is oriented to person, place, and time. She appears well-developed and well-nourished.  HENT:  Head: Normocephalic and atraumatic.  Eyes: Conjunctivae and EOM are normal.  Cardiovascular: Normal rate.  Pulmonary/Chest: Effort normal.  Neurological: She is alert and oriented to person, place, and time.  Skin: Skin is dry. No pallor.  Laceration is healing well. Approx 5 inches in length, horizontal.  Sutures removed easily. Covered with gauze with vaseline and Coban dressing.    Psychiatric: She has a normal mood and affect. Her behavior is normal.  Vitals reviewed.   BP (!) 154/66   Pulse (!) 58   Ht 5\' 4"  (1.626 m)   Wt 177 lb (80.3 kg)   SpO2 95%   BMI 30.38 kg/m  Wt Readings from  Last 3 Encounters:  07/13/19 177 lb (80.3 kg)  07/11/19 174 lb (78.9 kg)  04/13/19 176 lb (79.8 kg)     There are no preventive care reminders to display for this patient.  There are no preventive care reminders to display for this patient.  Lab Results  Component Value Date   TSH 3.20 03/09/2019   Lab Results  Component Value Date   WBC 9.0 03/09/2019   HGB 13.6 03/09/2019   HCT 40.6 03/09/2019   MCV 87.1 03/09/2019   PLT 256 03/09/2019   Lab Results  Component Value Date   NA 140 03/09/2019   K 4.7 03/09/2019   CO2 27 03/09/2019   GLUCOSE 81 03/09/2019   BUN 16 03/09/2019   CREATININE 0.97 (H) 03/09/2019   BILITOT 0.5 03/09/2019   ALKPHOS 64 11/18/2017   AST 19 03/09/2019   ALT 12 03/09/2019   PROT 7.0 03/09/2019   ALBUMIN 4.3 11/18/2017   CALCIUM 9.2 03/09/2019   ANIONGAP 8 12/10/2016   Lab Results  Component Value Date   CHOL 175 03/09/2019   Lab Results  Component Value Date   HDL 48 (L) 03/09/2019   Lab Results  Component Value Date   LDLCALC 93 03/09/2019   Lab Results  Component Value Date   TRIG 242 (H) 03/09/2019   Lab Results  Component Value Date   CHOLHDL 3.6 03/09/2019   Lab Results  Component Value Date   HGBA1C 5.8 (H) 06/19/2016      Assessment & Plan:   Problem List Items Addressed This Visit      Musculoskeletal and Integument   Urticaria, chronic    Other Visit Diagnoses    Laceration of left ankle, initial encounter    -  Primary   Visit for suture removal          Sutures removed and patient tolerated well.  Follow-up wound care discussed.  Please apply Vaseline twice a day for 2 weeks.  Avoid alcohol, peroxide etc.  Just use regular soap to gently cleanse and pat dry and apply Vaseline twice a  day.  Intermittent swelling/allergies/urticaria-we will try Allegra which might be less sedating in addition to a trial of Singulair.  Both prescriptions sent to her mail order.  Meds ordered this encounter  Medications  . fexofenadine (ALLEGRA) 180 MG tablet    Sig: Take 1 tablet (180 mg total) by mouth daily.    Dispense:  90 tablet    Refill:  1  . montelukast (SINGULAIR) 10 MG tablet    Sig: Take 1 tablet (10 mg total) by mouth at bedtime.    Dispense:  90 tablet    Refill:  1    Follow-up: No follow-ups on file.    Nani Gasseratherine Dulce Martian, MD

## 2019-07-27 ENCOUNTER — Other Ambulatory Visit: Payer: Self-pay | Admitting: Family Medicine

## 2019-08-03 ENCOUNTER — Other Ambulatory Visit: Payer: Self-pay

## 2019-08-03 MED ORDER — SIMVASTATIN 20 MG PO TABS: 20.0000 mg | ORAL_TABLET | Freq: Every evening | ORAL | 2 refills | Status: DC

## 2019-08-03 MED ORDER — SIMVASTATIN 20 MG PO TABS: 20.0000 mg | ORAL_TABLET | Freq: Every evening | ORAL | 0 refills | Status: DC

## 2019-08-03 NOTE — Addendum Note (Signed)
Addended by: Narda Rutherford on: 08/03/2019 04:16 PM   Modules accepted: Orders

## 2019-08-05 ENCOUNTER — Telehealth: Payer: Self-pay

## 2019-08-05 MED ORDER — MECLIZINE HCL 25 MG PO TABS: 25.0000 mg | ORAL_TABLET | Freq: Three times a day (TID) | ORAL | 0 refills | Status: DC | PRN

## 2019-08-05 NOTE — Telephone Encounter (Signed)
Call pt: Ok sounds like positional vertigo so may not actually be a med side effect. I will send in some Antivert for hte weekend. Lets schedule her for next week if not feeling better.

## 2019-08-05 NOTE — Telephone Encounter (Signed)
Patient notified of MD instructions and will call if no better. KG LPN

## 2019-08-05 NOTE — Telephone Encounter (Signed)
Ok, just try the fexofenadine and hold the montelukast and see if that helps.  Is she having room spinning.  It might also be vertigo.

## 2019-08-05 NOTE — Telephone Encounter (Signed)
Called patient and she states the room is spinning when she is lying down and having bad diarrhea that's black and green but does take a multivitamin. KG LPN

## 2019-08-05 NOTE — Telephone Encounter (Signed)
Rebekah Harrell called and states she just received the Fexofenadine and Montelukast a few days ago. She was prescribed them due to having off and on episodes of hives. She hasn't had hives in the last 2 weeks. She started taking them a few nights ago. She woke up the next morning very dizzy. She didn't take them the next night. She still woke this morning very dizzy. Please advise.

## 2019-08-25 ENCOUNTER — Other Ambulatory Visit: Payer: Self-pay | Admitting: Family Medicine

## 2019-09-05 ENCOUNTER — Other Ambulatory Visit: Payer: Self-pay

## 2019-09-05 ENCOUNTER — Ambulatory Visit (INDEPENDENT_AMBULATORY_CARE_PROVIDER_SITE_OTHER): Payer: Medicare HMO | Admitting: Obstetrics & Gynecology

## 2019-09-05 ENCOUNTER — Other Ambulatory Visit (HOSPITAL_COMMUNITY)
Admission: RE | Admit: 2019-09-05 | Discharge: 2019-09-05 | Disposition: A | Discharge status: Home / Self Care | Payer: Medicare HMO | Source: Ambulatory Visit | Attending: Obstetrics & Gynecology | Admitting: Obstetrics & Gynecology

## 2019-09-05 ENCOUNTER — Encounter: Payer: Self-pay | Admitting: Obstetrics & Gynecology

## 2019-09-05 VITALS — BP 138/62 | HR 55 | Ht 63.0 in | Wt 179.0 lb

## 2019-09-05 DIAGNOSIS — Q52129 Other and unspecified longitudinal vaginal septum: Secondary | ICD-10-CM

## 2019-09-05 DIAGNOSIS — N898 Other specified noninflammatory disorders of vagina: Secondary | ICD-10-CM | POA: Insufficient documentation

## 2019-09-05 NOTE — Progress Notes (Signed)
Pt is new patient here today for vaginal polyps diagnosed last year. Last MMG 02/2019 normal.

## 2019-09-05 NOTE — Progress Notes (Signed)
Patient ID: Rebekah MylarMarcia L Harrell, female   DOB: 16-Feb-1941, 78 y.o.   MRN: [id number]  Chief Complaint  Patient presents with  . New Patient (Initial Visit)    HPI Rebekah Harrell is a 78 y.o. female. She is referred by Dr. Linford ArnoldMetheney after she found a vaginal wall abnormality that she called "polyps".  The patient's main concern is that of a vaginal discharge that started about 18 months ago. She says that as it comes out of her vagina, it causes a burning sensation on her legs. This gets better when she showers or uses lotion on her inner thighs. She douches with vinegar or water about once per week. She had a normal wet prep done by Dr. Linford ArnoldMetheney on 04/13/19.   Past Medical History:  Diagnosis Date  . Aortic atherosclerosis ([facility])   . Arthritis    "lower back; knees" (11/26/2016)  . Asthma   . Chronic atrial fibrillation   . CKD (chronic kidney disease), stage III [facility])    patient unaware of this on 11/26/2016  . Complication of anesthesia    "had trouble waking me up in the late 1990's after gallbladder OR"  . GERD (gastroesophageal reflux disease)   . Heart murmur    "dx'd when I was a little kid"  . History of hiatal hernia   . Hyperlipidemia   . Hypertension   . Hypoglycemia     Past Surgical History:  Procedure Laterality Date  . CHOLECYSTECTOMY OPEN  1990s  . TONSILLECTOMY    . TUBAL LIGATION      Family History  Problem Relation Age of Onset  . CAD Mother        HEART ATTACK  . Colon cancer Father        KIDNEY FAILURE/COLON CANCER  . Atrial fibrillation Brother   . Heart failure Maternal Grandmother   . Kidney failure Maternal Grandfather   . Heart attack Paternal Grandmother   . Healthy Brother     Social History Social History   Tobacco Use  . Smoking status: Never Smoker  . Smokeless tobacco: Never Used  Substance Use Topics  . Alcohol use: Not Currently  . Drug use: No    Allergies  Allergen Reactions  . Ace Inhibitors Swelling  . Tramadol Other  (See Comments)    Night sweats, bad dreams  . Celecoxib Other (See Comments)    REACTION: hair loss  . Cetirizine Hcl Other (See Comments)    REACTION: fatigue  . Penicillins Other (See Comments)    Unknown  . Sulfonamide Derivatives Rash    Current Outpatient Medications  Medication Sig Dispense Refill  . amLODipine (NORVASC) 10 MG tablet Take 1 tablet (10 mg total) by mouth daily. 90 tablet 3  . CALCIUM PO Take 1 tablet by mouth at bedtime.    . Cholecalciferol (D-3-5) 5000 units capsule Take 5,000 Units by mouth daily.    . famotidine (PEPCID) 20 MG tablet TAKE 1 TABLET TWICE DAILY 180 tablet 1  . fexofenadine (ALLEGRA) 180 MG tablet Take 1 tablet (180 mg total) by mouth daily. 90 tablet 1  . hydrALAZINE (APRESOLINE) 50 MG tablet Take 1 tablet (50 mg total) by mouth 3 (three) times daily. Please make overdue appt with Dr. Jens Somrenshaw before anymore refills. 3rd and Final Attempt 90 tablet 0  . Magnesium 200 MG TABS Take 1 tablet (200 mg total) by mouth daily. 60 each   . montelukast (SINGULAIR) 10 MG tablet Take 1 tablet (10 mg total)  by mouth at bedtime. 90 tablet 1  . Multiple Vitamins-Minerals (MULTIVITAMIN PO) Take 1 tablet by mouth at bedtime.    Marland Kitchen omeprazole (PRILOSEC) 20 MG capsule TAKE 1 CAPSULE EVERY DAY 90 capsule 1  . Potassium Chloride ER 20 MEQ TBCR Take 20 mEq by mouth daily. 30 tablet 2  . simvastatin (ZOCOR) 20 MG tablet Take 1 tablet (20 mg total) by mouth every evening. Must schedule yearly exam and labs for refills 90 tablet 2  . sotalol (BETAPACE) 80 MG tablet Take 1 tablet (80 mg total) by mouth 2 (two) times daily. 180 tablet 3  . AMBULATORY NON FORMULARY MEDICATION Medication Name: allergy medication    . meclizine (ANTIVERT) 25 MG tablet Take 1 tablet (25 mg total) by mouth 3 (three) times daily as needed for dizziness. (Patient not taking: Reported on 09/05/2019) 30 tablet 0  . potassium chloride SA (K-DUR) 20 MEQ tablet TAKE 1 TABLET EVERY DAY (Patient not taking:  Reported on 09/05/2019) 90 tablet 1   No current facility-administered medications for this visit.     Review of Systems Review of Systems  Blood pressure 138/62, pulse (!) 55, height 5\' 3"  (1.6 m), weight 179 lb (81.2 kg).  Physical Exam Physical Exam Well nourished, well hydrated White female, no apparent distress Breathing, conversing, and ambulating normally She looks probably 43 years younger than her stated age. Vulva has only mild atrophy. She has a slightly darker skin color on her vulva and inner thighs, out about 10 cm from the labia majora on each side. She declined to have a picture taken of this area. Spec exam reveals a longitudinal length of raised vaginal tissue which I suspect is the remnants of a vaginal wall septum. She tells me that she has a "double uterus". I biopsied a piece of it. She tolerated this well. There is essentially no vaginal discharge.  Data Reviewed  notes and micro results  Assessment 1) Vulvar and inner thigh burning along with skin discoloration in this area. I rec'd that she see a dermatologist (She seemed disinclined to do this). 2) "vaginal discharge" per patient- none present today, wet prep sent 3) vaginal wall tissue- biopsy sent. I suspect normal tissue, reassurance given     Emily Filbert 09/05/2019, 10:58 AM

## 2019-09-06 LAB — CERVICOVAGINAL ANCILLARY ONLY
Bacterial vaginitis: NEGATIVE
Candida vaginitis: NEGATIVE

## 2019-09-21 ENCOUNTER — Other Ambulatory Visit: Payer: Self-pay | Admitting: Family Medicine

## 2019-10-13 ENCOUNTER — Other Ambulatory Visit: Payer: Self-pay | Admitting: Family Medicine

## 2019-12-01 ENCOUNTER — Other Ambulatory Visit: Payer: Self-pay | Admitting: Family Medicine

## 2020-01-11 ENCOUNTER — Other Ambulatory Visit: Payer: Self-pay | Admitting: Cardiology

## 2020-01-11 DIAGNOSIS — I10 Essential (primary) hypertension: Secondary | ICD-10-CM

## 2020-02-01 ENCOUNTER — Other Ambulatory Visit: Payer: Self-pay | Admitting: Cardiology

## 2020-02-01 DIAGNOSIS — I10 Essential (primary) hypertension: Secondary | ICD-10-CM

## 2020-02-24 ENCOUNTER — Other Ambulatory Visit: Payer: Self-pay | Admitting: Family Medicine

## 2020-03-13 ENCOUNTER — Other Ambulatory Visit: Payer: Self-pay

## 2020-03-13 MED ORDER — AMBULATORY NON FORMULARY MEDICATION: 50.0000 mg | 0 refills | Status: AC

## 2020-03-21 ENCOUNTER — Other Ambulatory Visit: Payer: Self-pay | Admitting: Cardiology

## 2020-03-21 DIAGNOSIS — I10 Essential (primary) hypertension: Secondary | ICD-10-CM

## 2020-03-21 MED ORDER — HYDRALAZINE HCL 50 MG PO TABS: ORAL_TABLET | ORAL | 0 refills | Status: DC

## 2020-03-21 NOTE — Telephone Encounter (Signed)
*  STAT* If patient is at the pharmacy, call can be transferred to refill team.   1. Which medications need to be refilled? (please list name of each medication and dose if known) hydrALAZINE (APRESOLINE) 50 MG tablet  2. Which pharmacy/location (including street and city if local pharmacy) is medication to be sent to? [facility] - [place], [place] - [address]  3. Do they need a 30 day or 90 day supply? 90   Patient is out of medication - she is not due for an appt with Dr. Jens Som until July 2021. She would like a temporary refill sent to [id number] - [place],  - [address]

## 2020-03-28 ENCOUNTER — Other Ambulatory Visit: Payer: Self-pay | Admitting: Family Medicine

## 2020-03-28 ENCOUNTER — Telehealth: Payer: Self-pay

## 2020-03-28 NOTE — Telephone Encounter (Signed)
Please call pt to schedule appt.  No further refills until pt is seen.  T. Lynnita Somma, CMA  

## 2020-04-02 ENCOUNTER — Telehealth: Payer: Self-pay | Admitting: Cardiology

## 2020-04-02 NOTE — Telephone Encounter (Signed)
Returned call to patient of Dr. Jens Som. She reports she went in AFib Friday. She was cleaning a lot of the weekend d/t family visiting (maybe over-did it) and a combination of seasonal allergies may have triggered this event. She has had PAF episodes sporadically but they typically only lasted a few hours. She reports she cannot walk up stairs, having a hard time breathing, fatigue. She was able to "feel" her heart fluttering. She reports her BP and pulse are all over the place.   When she was first diagnosed with AFib she was unaware of this.   Advised patient should have a f/u of episode/symptoms. Scheduled for 4/13 @ 2pm with Lupita Leash NP, who patient has seen previously.

## 2020-04-02 NOTE — Telephone Encounter (Signed)
Patient c/o Palpitations:  High priority if patient c/o lightheadedness, shortness of breath, or chest pain  1) How long have you had palpitations/irregular HR/ Afib? Are you having the symptoms now? Yes, since Friday night  2) Are you currently experiencing lightheadedness, SOB or CP? No  3) Do you have a history of afib (atrial fibrillation) or irregular heart rhythm? yes  4) Have you checked your BP or HR? (document readings if available):  138/72 HR 77, 121/85 HR 81   5) Are you experiencing any other symptoms? Headache and sweating, SOB when moving around   Patient states she went back into afib Friday night, but is feeling better. She states she has been having a headache and has been sweating. She also states she is SOB when moving around.   ?

## 2020-04-03 ENCOUNTER — Encounter (HOSPITAL_COMMUNITY): Payer: Self-pay | Admitting: Nurse Practitioner

## 2020-04-03 ENCOUNTER — Other Ambulatory Visit: Payer: Self-pay

## 2020-04-03 ENCOUNTER — Ambulatory Visit (HOSPITAL_COMMUNITY)
Admission: RE | Admit: 2020-04-03 | Discharge: 2020-04-03 | Disposition: A | Discharge status: Home / Self Care | Payer: Medicare HMO | Source: Ambulatory Visit | Attending: Nurse Practitioner | Admitting: Nurse Practitioner

## 2020-04-03 VITALS — BP 112/68 | HR 113 | Ht 63.0 in | Wt 176.4 lb

## 2020-04-03 DIAGNOSIS — D6869 Other thrombophilia: Secondary | ICD-10-CM

## 2020-04-03 DIAGNOSIS — E785 Hyperlipidemia, unspecified: Secondary | ICD-10-CM | POA: Insufficient documentation

## 2020-04-03 DIAGNOSIS — J45909 Unspecified asthma, uncomplicated: Secondary | ICD-10-CM | POA: Diagnosis not present

## 2020-04-03 DIAGNOSIS — I480 Paroxysmal atrial fibrillation: Secondary | ICD-10-CM | POA: Diagnosis not present

## 2020-04-03 DIAGNOSIS — Z882 Allergy status to sulfonamides status: Secondary | ICD-10-CM | POA: Diagnosis not present

## 2020-04-03 DIAGNOSIS — Z79899 Other long term (current) drug therapy: Secondary | ICD-10-CM | POA: Insufficient documentation

## 2020-04-03 DIAGNOSIS — Z7901 Long term (current) use of anticoagulants: Secondary | ICD-10-CM | POA: Diagnosis not present

## 2020-04-03 DIAGNOSIS — I4819 Other persistent atrial fibrillation: Secondary | ICD-10-CM | POA: Insufficient documentation

## 2020-04-03 DIAGNOSIS — Z888 Allergy status to other drugs, medicaments and biological substances status: Secondary | ICD-10-CM | POA: Insufficient documentation

## 2020-04-03 DIAGNOSIS — K219 Gastro-esophageal reflux disease without esophagitis: Secondary | ICD-10-CM | POA: Insufficient documentation

## 2020-04-03 DIAGNOSIS — I129 Hypertensive chronic kidney disease with stage 1 through stage 4 chronic kidney disease, or unspecified chronic kidney disease: Secondary | ICD-10-CM | POA: Diagnosis not present

## 2020-04-03 DIAGNOSIS — Z8249 Family history of ischemic heart disease and other diseases of the circulatory system: Secondary | ICD-10-CM | POA: Diagnosis not present

## 2020-04-03 DIAGNOSIS — Z880 Allergy status to penicillin: Secondary | ICD-10-CM | POA: Insufficient documentation

## 2020-04-03 DIAGNOSIS — Z800 Family history of malignant neoplasm of digestive organs: Secondary | ICD-10-CM | POA: Insufficient documentation

## 2020-04-03 DIAGNOSIS — N183 Chronic kidney disease, stage 3 unspecified: Secondary | ICD-10-CM | POA: Insufficient documentation

## 2020-04-03 MED ORDER — APIXABAN 5 MG PO TABS: 5.0000 mg | ORAL_TABLET | Freq: Two times a day (BID) | ORAL | 2 refills | Status: DC

## 2020-04-03 NOTE — Patient Instructions (Signed)
Start Eliquis 5mg twice a day 

## 2020-04-03 NOTE — Progress Notes (Signed)
Primary Care Physician: Hali Marry, MD Referring Physician: Dr. Clydell Hakim is a 79 y.o. female with a h/o paroxysmal afib on sotalol 80 mg bid since 2017. She  will have short spells of afib over the years but  usually self corrects by itself. She came off eliquis years ago 2/2 the expense. Chadsvasc score of at least 4. She  is here today as she developed afib Friday and it has persisted. She had baseline shortness  of breath which she contributes to her asthma but since Friday this has been more noticeable.  At home over the weekend her v rates have been less than 100 bpm, more  in line with 70/80's.   Today, she denies symptoms of palpitations, chest pain, shortness of breath, orthopnea, PND, lower extremity edema, dizziness, presyncope, syncope, or neurologic sequela. The patient is tolerating medications without difficulties and is otherwise without complaint today.   Past Medical History:  Diagnosis Date  . Aortic atherosclerosis ([place])   . Arthritis    "lower back; knees" (11/26/2016)  . Asthma   . Chronic atrial fibrillation ([place])   . CKD (chronic kidney disease), stage III    patient unaware of this on 11/26/2016  . Complication of anesthesia    "had trouble waking me up in the late 1990's after gallbladder OR"  . GERD (gastroesophageal reflux disease)   . Heart murmur    "dx'd when I was a little kid"  . History of hiatal hernia   . Hyperlipidemia   . Hypertension   . Hypoglycemia    Past Surgical History:  Procedure Laterality Date  . CHOLECYSTECTOMY OPEN  1990s  . TONSILLECTOMY    . TUBAL LIGATION      Current Outpatient Medications  Medication Sig Dispense Refill  . amLODipine (NORVASC) 10 MG tablet Take 1 tablet (10 mg total) by mouth daily. 90 tablet 3  . CALCIUM PO Take 1 tablet by mouth at bedtime.    . Cholecalciferol (D-3-5) 5000 units capsule Take 5,000 Units by mouth daily.    . famotidine (PEPCID) 20 MG tablet TAKE 1 TABLET  TWICE DAILY 180 tablet 1  . fexofenadine (ALLEGRA) 180 MG tablet TAKE 1 TABLET EVERY DAY 90 tablet 1  . hydrALAZINE (APRESOLINE) 50 MG tablet Take 1 tablet (50 mg total) by mouth 3 (three) times daily. TAKE 1 TABLET BY MOUTH THREE TIMES DAILY 270 tablet 0  . Magnesium 200 MG TABS Take 1 tablet (200 mg total) by mouth daily. 60 each   . montelukast (SINGULAIR) 10 MG tablet TAKE 1 TABLET AT BEDTIME 90 tablet 1  . Multiple Vitamins-Minerals (MULTIVITAMIN PO) Take 1 tablet by mouth at bedtime.    Marland Kitchen omeprazole (PRILOSEC) 20 MG capsule TAKE 1 CAPSULE EVERY DAY 30 capsule 0  . Potassium Chloride ER 20 MEQ TBCR Take 20 mEq by mouth daily. 30 tablet 2  . simvastatin (ZOCOR) 20 MG tablet Take 1 tablet (20 mg total) by mouth every evening. Must schedule yearly exam and labs for refills 90 tablet 2  . sotalol (BETAPACE) 80 MG tablet Take 1 tablet (80 mg total) by mouth 2 (two) times daily. 180 tablet 3  . apixaban (ELIQUIS) 5 MG TABS tablet Take 1 tablet (5 mg total) by mouth 2 (two) times daily. 180 tablet 2   No current facility-administered medications for this encounter.    Allergies  Allergen Reactions  . Ace Inhibitors Swelling  . Tramadol Other (See Comments)  Night sweats, bad dreams  . Celecoxib Other (See Comments)    REACTION: hair loss  . Cetirizine Hcl Other (See Comments)    REACTION: fatigue  . Penicillins Other (See Comments)    Unknown  . Sulfonamide Derivatives Rash    Social History   Socioeconomic History  . Marital status: Divorced    Spouse name: Not on file  . Number of children: 3  . Years of education: 51  . Highest education level: Associate degree: academic program  Occupational History  . 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 Smoking status: Never Smoker  . Smokeless tobacco: Never Used  Substance and Sexual Activity  . Alcohol use: Not Currently  . Drug use: No  . Sexual activity: Not Currently  Other Topics  Concern  . 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 Drinks tea during the day.   Social Determinants of Health   Financial Resource Strain:   . Difficulty of Paying Living Expenses:   Food Insecurity:   . Worried About Programme researcher, broadcasting/film/video in the Last Year:   . Barista in the Last Year:   Transportation Needs:   . Freight forwarder (Medical):   Marland Kitchen Lack of Transportation (Non-Medical):   Physical Activity:   . Days of Exercise per Week:   . Minutes of Exercise per Session:   Stress:   . Feeling of Stress :   Social Connections:   . Frequency of Communication with Friends and Family:   . Frequency of Social Gatherings with Friends and Family:   . Attends Religious Services:   . Active Member of Clubs or Organizations:   . Attends Banker Meetings:   Marland Kitchen Marital Status:   Intimate Partner Violence:   . Fear of Current or Ex-Partner:   . Emotionally Abused:   Marland Kitchen Physically Abused:   . Sexually Abused:     Family History  Problem Relation Age of Onset  . CAD Mother        HEART ATTACK  . Colon cancer Father        KIDNEY FAILURE/COLON CANCER  . Atrial fibrillation Brother   . Heart failure Maternal Grandmother   . Kidney failure Maternal Grandfather   . Heart attack Paternal Grandmother   . Healthy Brother     ROS- All systems are reviewed and negative except as per the HPI above  Physical Exam: Vitals:   04/03/20 1416  BP: 112/68  Pulse: (!) 113  Weight: 80 kg  Height: 5\' 3"  (1.6 m)   Wt Readings from Last 3 Encounters:  04/03/20 80 kg  09/05/19 81.2 kg  07/13/19 80.3 kg    Labs: Lab Results  Component Value Date   NA 140 03/09/2019   K 4.7 03/09/2019   CL 105 03/09/2019   CO2 27 03/09/2019   GLUCOSE 81 03/09/2019   BUN 16 03/09/2019   CREATININE 0.97 (H) 03/09/2019   CALCIUM 9.2 03/09/2019   MG 1.8 01/15/2017   No results found for: INR Lab Results    Component Value Date   CHOL 175 03/09/2019   HDL 48 (L) 03/09/2019   LDLCALC 93 03/09/2019   TRIG 242 (H) 03/09/2019     GEN- The patient is well appearing, alert and oriented x 3 today.   Head- normocephalic, atraumatic Eyes-  Sclera  clear, conjunctiva pink Ears- hearing intact Oropharynx- clear Neck- supple, no JVP Lymph- no cervical lymphadenopathy Lungs- Clear to ausculation bilaterally, normal work of breathing Heart- irregular rate and rhythm, no murmurs, rubs or gallops, PMI not laterally displaced GI- soft, NT, ND, + BS Extremities- no clubbing, cyanosis, or edema MS- no significant deformity or atrophy Skin- no rash or lesion Psych- euthymic mood, full affect Neuro- strength and sensation are intact  EKG- Afib with rvr at 113 bpm, qrs int 88 ms, qtc 493 ms    Assessment and Plan: 1. Persistent afib since Friday No triggers identified Contemplated  adding rate control but she states at home HR's are controlled Continue  sotalol 80 mg bid   2. CHA2DS2VASc of at least 4 Discussed with pt according to guidelines and stroke risk that she should be taking eliquis 5 mg bid  RX sent in, samples given to start tonight  I informed her that I can not try to restore SR until she is back on anticoagulation She is outside of the 48 hour window to cardiovert in the ER  I will see back in one week, if still out of rhythm, see if she prefers to go ahead with TEE/DCCV or wait an additional 2 weeks for cardioversion  Lupita Leash C. [facility] [address] [place], [place] [address] [phone]

## 2020-04-05 ENCOUNTER — Telehealth (HOSPITAL_COMMUNITY): Payer: Self-pay | Admitting: *Deleted

## 2020-04-05 NOTE — Telephone Encounter (Signed)
Patient approved for tier exception tier 1 copay for eliquis through 12/21/20 via [employer].

## 2020-04-06 ENCOUNTER — Other Ambulatory Visit: Payer: Self-pay

## 2020-04-06 ENCOUNTER — Ambulatory Visit (INDEPENDENT_AMBULATORY_CARE_PROVIDER_SITE_OTHER): Payer: Medicare HMO

## 2020-04-06 ENCOUNTER — Ambulatory Visit (INDEPENDENT_AMBULATORY_CARE_PROVIDER_SITE_OTHER): Payer: Medicare HMO | Admitting: Family Medicine

## 2020-04-06 ENCOUNTER — Encounter: Payer: Self-pay | Admitting: Family Medicine

## 2020-04-06 VITALS — BP 118/74 | HR 97 | Ht 63.0 in | Wt 179.0 lb

## 2020-04-06 DIAGNOSIS — R0602 Shortness of breath: Secondary | ICD-10-CM

## 2020-04-06 DIAGNOSIS — I480 Paroxysmal atrial fibrillation: Secondary | ICD-10-CM

## 2020-04-06 DIAGNOSIS — I10 Essential (primary) hypertension: Secondary | ICD-10-CM | POA: Diagnosis not present

## 2020-04-06 NOTE — Progress Notes (Signed)
Acute Office Visit  Subjective:    Patient ID: Rebekah Harrell, female    DOB: Jul 11, 1941, 79 y.o.   MRN: [id number]  Chief Complaint  Patient presents with  . Shortness of Breath  . Chest Pain    HPI Patient is in today for chest pain and SOB since last Friday ( approx 1 wk). She has a hx of afib and deconditioning but has felt more SOB. She was seen in afib clinic recently . She had stopped her Eliquis but  Has now restarted it  She denies chest pain or cough. She says she can  Wash dishes  nd then has to sit down or can fold audny and thn has to sit down to catch her breath. Feels lke she just can't live like this.    Past Medical History:  Diagnosis Date  . Aortic atherosclerosis ([facility])   . Arthritis    "lower back; knees" (11/26/2016)  . Asthma   . Chronic atrial fibrillation ([facility])   . CKD (chronic kidney disease), stage III    patient unaware of this on 11/26/2016  . Complication of anesthesia    "had trouble waking me up in the late 1990's after gallbladder OR"  . GERD (gastroesophageal reflux disease)   . Heart murmur    "dx'd when I was a little kid"  . History of hiatal hernia   . Hyperlipidemia   . Hypertension   . Hypoglycemia     Past Surgical History:  Procedure Laterality Date  . CHOLECYSTECTOMY OPEN  1990s  . TONSILLECTOMY    . TUBAL LIGATION      Family History  Problem Relation Age of Onset  . CAD Mother        HEART ATTACK  . Colon cancer Father        KIDNEY FAILURE/COLON CANCER  . Atrial fibrillation Brother   . Heart failure Maternal Grandmother   . Kidney failure Maternal Grandfather   . Heart attack Paternal Grandmother   . Healthy Brother     Social History   Socioeconomic History  . Marital status: Divorced    Spouse name: Not on file  . Number of children: 3  . Years of education: 68  . Highest education level: Associate degree: academic program  Occupational History  . 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 Smoking status: Never Smoker  . Smokeless tobacco: Never Used  Substance and Sexual Activity  . Alcohol use: Not Currently  . Drug use: No  . Sexual activity: Not Currently  Other Topics Concern  . 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 Drinks tea during the day.   Social Determinants of Health   Financial Resource Strain:   . Difficulty of Paying Living Expenses:   Food Insecurity:   . Worried About Programme researcher, broadcasting/film/video in the Last Year:   . Barista in the Last Year:   Transportation Needs:   . Freight forwarder (Medical):   Marland Kitchen Lack of Transportation (Non-Medical):   Physical Activity:   . Days of Exercise per Week:   . Minutes of Exercise per Session:   Stress:   . Feeling of Stress :   Social Connections:   . Frequency of Communication with Friends and Family:   . Frequency of Social Gatherings with Friends and Family:   .  Attends Religious Services:   . Active Member of Clubs or Organizations:   . Attends Archivist Meetings:   Marland Kitchen Marital Status:   Intimate Partner Violence:   . Fear of Current or Ex-Partner:   . Emotionally Abused:   Marland Kitchen Physically Abused:   . Sexually Abused:     Outpatient Medications Prior to Visit  Medication Sig Dispense Refill  . amLODipine (NORVASC) 10 MG tablet Take 1 tablet (10 mg total) by mouth daily. 90 tablet 3  . apixaban (ELIQUIS) 5 MG TABS tablet Take 1 tablet (5 mg total) by mouth 2 (two) times daily. 180 tablet 2  . CALCIUM PO Take 1 tablet by mouth at bedtime.    . Cholecalciferol (D-3-5) 5000 units capsule Take 5,000 Units by mouth daily.    . famotidine (PEPCID) 20 MG tablet TAKE 1 TABLET TWICE DAILY 180 tablet 1  . fexofenadine (ALLEGRA) 180 MG tablet TAKE 1 TABLET EVERY DAY 90 tablet 1  . hydrALAZINE (APRESOLINE) 50 MG tablet Take 1 tablet (50 mg total) by mouth 3 (three) times daily. TAKE 1 TABLET BY MOUTH  THREE TIMES DAILY 270 tablet 0  . Magnesium 200 MG TABS Take 1 tablet (200 mg total) by mouth daily. 60 each   . montelukast (SINGULAIR) 10 MG tablet TAKE 1 TABLET AT BEDTIME 90 tablet 1  . Multiple Vitamins-Minerals (MULTIVITAMIN PO) Take 1 tablet by mouth at bedtime.    Marland Kitchen omeprazole (PRILOSEC) 20 MG capsule TAKE 1 CAPSULE EVERY DAY 30 capsule 0  . Potassium Chloride ER 20 MEQ TBCR Take 20 mEq by mouth daily. 30 tablet 2  . simvastatin (ZOCOR) 20 MG tablet Take 1 tablet (20 mg total) by mouth every evening. Must schedule yearly exam and labs for refills 90 tablet 2  . sotalol (BETAPACE) 80 MG tablet Take 1 tablet (80 mg total) by mouth 2 (two) times daily. 180 tablet 3   No facility-administered medications prior to visit.    Allergies  Allergen Reactions  . Ace Inhibitors Swelling  . Tramadol Other (See Comments)    Night sweats, bad dreams  . Celecoxib Other (See Comments)    REACTION: hair loss  . Cetirizine Hcl Other (See Comments)    REACTION: fatigue  . Penicillins Other (See Comments)    Unknown  . Sulfonamide Derivatives Rash    Review of Systems     Objective:    Physical Exam Constitutional:      Appearance: She is well-developed.  HENT:     Head: Normocephalic and atraumatic.  Cardiovascular:     Rate and Rhythm: Normal rate and regular rhythm.     Heart sounds: Normal heart sounds.  Pulmonary:     Effort: Pulmonary effort is normal.     Breath sounds: Normal breath sounds.  Skin:    General: Skin is warm and dry.  Neurological:     Mental Status: She is alert and oriented to person, place, and time.  Psychiatric:        Behavior: Behavior normal.     BP 118/74   Pulse 97   Ht 5\' 3"  (1.6 m)   Wt 179 lb (81.2 kg)   SpO2 99%   BMI 31.71 kg/m  Wt Readings from Last 3 Encounters:  04/06/20 179 lb (81.2 kg)  04/03/20 176 lb 6.4 oz (80 kg)  09/05/19 179 lb (81.2 kg)    Health Maintenance Due  Topic Date Due  . COVID-19 Vaccine (1) Never done  .  PNA vac Low Risk Adult (1 of 2 - PCV13) Never done  . MAMMOGRAM  03/08/2020    There are no preventive care reminders to display for this patient.   Lab Results  Component Value Date   TSH 3.20 03/09/2019   Lab Results  Component Value Date   WBC 9.0 03/09/2019   HGB 13.6 03/09/2019   HCT 40.6 03/09/2019   MCV 87.1 03/09/2019   PLT 256 03/09/2019   Lab Results  Component Value Date   NA 140 03/09/2019   K 4.7 03/09/2019   CO2 27 03/09/2019   GLUCOSE 81 03/09/2019   BUN 16 03/09/2019   CREATININE 0.97 (H) 03/09/2019   BILITOT 0.5 03/09/2019   ALKPHOS 64 11/18/2017   AST 19 03/09/2019   ALT 12 03/09/2019   PROT 7.0 03/09/2019   ALBUMIN 4.3 11/18/2017   CALCIUM 9.2 03/09/2019   ANIONGAP 8 12/10/2016   Lab Results  Component Value Date   CHOL 175 03/09/2019   Lab Results  Component Value Date   HDL 48 (L) 03/09/2019   Lab Results  Component Value Date   LDLCALC 93 03/09/2019   Lab Results  Component Value Date   TRIG 242 (H) 03/09/2019   Lab Results  Component Value Date   CHOLHDL 3.6 03/09/2019   Lab Results  Component Value Date   HGBA1C 5.8 (H) 06/19/2016       Assessment & Plan:   Problem List Items Addressed This Visit      Cardiovascular and Mediastinum   HYPERTENSION, BENIGN ESSENTIAL    Well controlled. Continue current regimen. Follow up in  6 mo. due for labs.       Relevant Orders   TSH   COMPLETE METABOLIC PANEL WITH GFR   Lipid panel   CBC   Atrial fibrillation (HCC)    Back on her Eliquis.        Relevant Orders   ECHOCARDIOGRAM COMPLETE   TSH   COMPLETE METABOLIC PANEL WITH GFR   Lipid panel   CBC    Other Visit Diagnoses    SOB (shortness of breath)    -  Primary   Relevant Orders   DG Chest 2 View (Completed)   ECHOCARDIOGRAM COMPLETE   TSH   COMPLETE METABOLIC PANEL WITH GFR   Lipid panel   CBC     SOB - unclear etiology. Will start with labs to rule out anemia, thyroid, etc. Will check chest xray.  Afib  could be contributing. Consider cardiac rehab. She also has a history of asthma.  Consider schedule for spirometry and test peak flows to  See if this  Could be contributing.  Repeat echo - last 2017.  No orders of the defined types were placed in this encounter.    Nani Gasser, MD

## 2020-04-06 NOTE — Assessment & Plan Note (Signed)
Back on her Eliquis.

## 2020-04-06 NOTE — Patient Instructions (Signed)
Please go for chest x-ray today and try to go for blood work fasting 1 morning if you are able to.  I am also going to schedule you for spirometry here again so we can compare to your numbers from 3-1/2 years ago.  I am also going to schedule you for an echocardiogram which was also done in 2017 so we can see if there are any changes there.

## 2020-04-08 NOTE — Assessment & Plan Note (Signed)
Well controlled. Continue current regimen. Follow up in  6 mo due for labs.  

## 2020-04-10 ENCOUNTER — Encounter (HOSPITAL_COMMUNITY): Payer: Self-pay | Admitting: Nurse Practitioner

## 2020-04-10 ENCOUNTER — Other Ambulatory Visit: Payer: Self-pay

## 2020-04-10 ENCOUNTER — Ambulatory Visit (HOSPITAL_BASED_OUTPATIENT_CLINIC_OR_DEPARTMENT_OTHER)
Admission: RE | Admit: 2020-04-10 | Discharge: 2020-04-10 | Disposition: A | Discharge status: Home / Self Care | Payer: Medicare HMO | Source: Ambulatory Visit | Attending: Nurse Practitioner | Admitting: Nurse Practitioner

## 2020-04-10 VITALS — BP 130/60 | HR 61 | Ht 63.0 in | Wt 177.2 lb

## 2020-04-10 DIAGNOSIS — Z882 Allergy status to sulfonamides status: Secondary | ICD-10-CM | POA: Diagnosis not present

## 2020-04-10 DIAGNOSIS — E785 Hyperlipidemia, unspecified: Secondary | ICD-10-CM | POA: Insufficient documentation

## 2020-04-10 DIAGNOSIS — Z8249 Family history of ischemic heart disease and other diseases of the circulatory system: Secondary | ICD-10-CM | POA: Diagnosis not present

## 2020-04-10 DIAGNOSIS — D6869 Other thrombophilia: Secondary | ICD-10-CM

## 2020-04-10 DIAGNOSIS — N183 Chronic kidney disease, stage 3 unspecified: Secondary | ICD-10-CM | POA: Diagnosis not present

## 2020-04-10 DIAGNOSIS — I129 Hypertensive chronic kidney disease with stage 1 through stage 4 chronic kidney disease, or unspecified chronic kidney disease: Secondary | ICD-10-CM | POA: Diagnosis not present

## 2020-04-10 DIAGNOSIS — Z7901 Long term (current) use of anticoagulants: Secondary | ICD-10-CM | POA: Diagnosis not present

## 2020-04-10 DIAGNOSIS — J45909 Unspecified asthma, uncomplicated: Secondary | ICD-10-CM | POA: Insufficient documentation

## 2020-04-10 DIAGNOSIS — Z79899 Other long term (current) drug therapy: Secondary | ICD-10-CM | POA: Insufficient documentation

## 2020-04-10 DIAGNOSIS — K219 Gastro-esophageal reflux disease without esophagitis: Secondary | ICD-10-CM | POA: Insufficient documentation

## 2020-04-10 DIAGNOSIS — I480 Paroxysmal atrial fibrillation: Secondary | ICD-10-CM

## 2020-04-10 DIAGNOSIS — I482 Chronic atrial fibrillation, unspecified: Secondary | ICD-10-CM | POA: Insufficient documentation

## 2020-04-10 MED ORDER — DILTIAZEM HCL 30 MG PO TABS: ORAL_TABLET | ORAL | 1 refills | Status: DC

## 2020-04-10 NOTE — Progress Notes (Signed)
Primary Care Physician: Agapito Games, MD Referring Physician: Dr. Emilio Aspen is a 79 y.o. female with a h/o paroxysmal afib on sotalol 80 mg bid since 2017. She  will have short spells of afib over the years but  usually self corrects by itself. She came off eliquis years ago 2/2 the expense. Chadsvasc score of at least 4. She  is here today as she developed afib Friday and it has persisted. She had baseline shortness  of breath which she contributes to her asthma but since Friday this has been more noticeable.  At home over the weekend her v rates have been less than 100 bpm, more  in line with 70/80's.   F/u in afib cliinc 4/20. She  returned to SR yesterday. She reports that she is having more issues with shortness of breath which is long standing. No improved with return to SR. Her PCP recently obtained a CXR, wnl, echo is pending tomorrow and PFT's are pending. She has gotten back on eliquis 5 mg bid.   Today, she denies symptoms of palpitations, chest pain, shortness of breath, orthopnea, PND, lower extremity edema, dizziness, presyncope, syncope, or neurologic sequela. The patient is tolerating medications without difficulties and is otherwise without complaint today.   Past Medical History:  Diagnosis Date  . Aortic atherosclerosis ([facility])   . Arthritis    "lower back; knees" (11/26/2016)  . Asthma   . Chronic atrial fibrillation ([facility])   . CKD (chronic kidney disease), stage III    patient unaware of this on 11/26/2016  . Complication of anesthesia    "had trouble waking me up in the late 1990's after gallbladder OR"  . GERD (gastroesophageal reflux disease)   . Heart murmur    "dx'd when I was a little kid"  . History of hiatal hernia   . Hyperlipidemia   . Hypertension   . Hypoglycemia    Past Surgical History:  Procedure Laterality Date  . CHOLECYSTECTOMY OPEN  1990s  . TONSILLECTOMY    . TUBAL LIGATION      Current Outpatient Medications   Medication Sig Dispense Refill  . amLODipine (NORVASC) 10 MG tablet Take 1 tablet (10 mg total) by mouth daily. 90 tablet 3  . apixaban (ELIQUIS) 5 MG TABS tablet Take 1 tablet (5 mg total) by mouth 2 (two) times daily. 180 tablet 2  . CALCIUM PO Take 1 tablet by mouth at bedtime.    . Cholecalciferol (D-3-5) 5000 units capsule Take 5,000 Units by mouth daily.    . famotidine (PEPCID) 20 MG tablet TAKE 1 TABLET TWICE DAILY 180 tablet 1  . fexofenadine (ALLEGRA) 180 MG tablet TAKE 1 TABLET EVERY DAY 90 tablet 1  . hydrALAZINE (APRESOLINE) 50 MG tablet Take 1 tablet (50 mg total) by mouth 3 (three) times daily. TAKE 1 TABLET BY MOUTH THREE TIMES DAILY 270 tablet 0  . Magnesium 200 MG TABS Take 1 tablet (200 mg total) by mouth daily. 60 each   . montelukast (SINGULAIR) 10 MG tablet TAKE 1 TABLET AT BEDTIME 90 tablet 1  . Multiple Vitamins-Minerals (MULTIVITAMIN PO) Take 1 tablet by mouth at bedtime.    Marland Kitchen omeprazole (PRILOSEC) 20 MG capsule TAKE 1 CAPSULE EVERY DAY 30 capsule 0  . Potassium Chloride ER 20 MEQ TBCR Take 20 mEq by mouth daily. 30 tablet 2  . simvastatin (ZOCOR) 20 MG tablet Take 1 tablet (20 mg total) by mouth every evening. Must schedule yearly exam  and labs for refills 90 tablet 2  . sotalol (BETAPACE) 80 MG tablet Take 1 tablet (80 mg total) by mouth 2 (two) times daily. 180 tablet 3   No current facility-administered medications for this encounter.    Allergies  Allergen Reactions  . Ace Inhibitors Swelling  . Tramadol Other (See Comments)    Night sweats, bad dreams  . Celecoxib Other (See Comments)    REACTION: hair loss  . Cetirizine Hcl Other (See Comments)    REACTION: fatigue  . Penicillins Other (See Comments)    Unknown  . Sulfonamide Derivatives Rash    Social History   Socioeconomic History  . Marital status: Divorced    Spouse name: Not on file  . Number of children: 3  . Years of education: 1  . Highest education level: Associate degree: academic  program  Occupational History  . 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 Smoking status: Never Smoker  . Smokeless tobacco: Never Used  Substance and Sexual Activity  . Alcohol use: Not Currently  . Drug use: No  . Sexual activity: Not Currently  Other Topics Concern  . 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 Drinks tea during the day.   Social Determinants of Health   Financial Resource Strain:   . Difficulty of Paying Living Expenses:   Food Insecurity:   . Worried About Charity fundraiser in the Last Year:   . Arboriculturist in the Last Year:   Transportation Needs:   . Film/video editor (Medical):   Marland Kitchen Lack of Transportation (Non-Medical):   Physical Activity:   . Days of Exercise per Week:   . Minutes of Exercise per Session:   Stress:   . Feeling of Stress :   Social Connections:   . Frequency of Communication with Friends and Family:   . Frequency of Social Gatherings with Friends and Family:   . Attends Religious Services:   . Active Member of Clubs or Organizations:   . Attends Archivist Meetings:   Marland Kitchen Marital Status:   Intimate Partner Violence:   . Fear of Current or Ex-Partner:   . Emotionally Abused:   Marland Kitchen Physically Abused:   . Sexually Abused:     Family History  Problem Relation Age of Onset  . CAD Mother        HEART ATTACK  . Colon cancer Father        KIDNEY FAILURE/COLON CANCER  . Atrial fibrillation Brother   . Heart failure Maternal Grandmother   . Kidney failure Maternal Grandfather   . Heart attack Paternal Grandmother   . Healthy Brother     ROS- All systems are reviewed and negative except as per the HPI above  Physical Exam: Vitals:   04/10/20 1410  BP: 130/60  Pulse: 61  Weight: 80.4 kg  Height: 5\' 3"  (1.6 m)   Wt Readings from Last 3 Encounters:  04/10/20 80.4 kg  04/06/20 81.2 kg  04/03/20 80 kg     Labs: Lab Results  Component Value Date   NA 140 03/09/2019   K 4.7 03/09/2019   CL 105 03/09/2019   CO2 27 03/09/2019   GLUCOSE 81 03/09/2019   BUN 16 03/09/2019   CREATININE 0.97 (H) 03/09/2019   CALCIUM 9.2 03/09/2019   MG 1.8 01/15/2017  No results found for: INR Lab Results  Component Value Date   CHOL 175 03/09/2019   HDL 48 (L) 03/09/2019   LDLCALC 93 03/09/2019   TRIG 242 (H) 03/09/2019     GEN- The patient is well appearing, alert and oriented x 3 today.   Head- normocephalic, atraumatic Eyes-  Sclera clear, conjunctiva pink Ears- hearing intact Oropharynx- clear Neck- supple, no JVP Lymph- no cervical lymphadenopathy Lungs- Clear to ausculation bilaterally, normal work of breathing Heart- regular rate and rhythm, no murmurs, rubs or gallops, PMI not laterally displaced GI- soft, NT, ND, + BS Extremities- no clubbing, cyanosis, or edema MS- no significant deformity or atrophy Skin- no rash or lesion Psych- euthymic mood, full affect Neuro- strength and sensation are intact  EKG- NSR at 61 bpm, PR int 136 ms, qrs int 86 ms, qtc 471 ms     Assessment and Plan: 1. Persistent afib since Friday one week ago Spontaneously converted yesterday  Will RX 30 mg cardizem to use with breakthrough episodes if HR over 100 bpm and systolic BP over 062 No triggers identified Continue  sotalol 80 mg bid   2. CHA2DS2VASc of at least 4 Discussed with pt according to guidelines and stroke risk that she should be taking eliquis 5 mg bid  Is taking, no issues   3. Ongoing shortness of breath  Has had for many years but is getting worse  Not improved with return of SR PCP is working up, echo pending tomorrow and PFT's also pending  Has h/o of asthma, Sotalol aggravating ? Pt informed if no other reason found for breathing issues, we can discuss other means to restore SR and stop sotalol  F/u with Dr. Jens Som in July per recall afib clinic as needed   Elvina Sidle.  [facility] [address] [place], [place] [address] [phone]

## 2020-04-11 ENCOUNTER — Other Ambulatory Visit: Payer: Self-pay | Admitting: Family Medicine

## 2020-04-11 ENCOUNTER — Ambulatory Visit (HOSPITAL_BASED_OUTPATIENT_CLINIC_OR_DEPARTMENT_OTHER)
Admission: RE | Admit: 2020-04-11 | Discharge: 2020-04-11 | Disposition: A | Discharge status: Home / Self Care | Payer: Medicare HMO | Source: Ambulatory Visit | Attending: Family Medicine | Admitting: Family Medicine

## 2020-04-11 DIAGNOSIS — I480 Paroxysmal atrial fibrillation: Secondary | ICD-10-CM

## 2020-04-11 DIAGNOSIS — Z79899 Other long term (current) drug therapy: Secondary | ICD-10-CM | POA: Diagnosis not present

## 2020-04-11 DIAGNOSIS — Z882 Allergy status to sulfonamides status: Secondary | ICD-10-CM | POA: Diagnosis not present

## 2020-04-11 DIAGNOSIS — Z7901 Long term (current) use of anticoagulants: Secondary | ICD-10-CM | POA: Diagnosis not present

## 2020-04-11 DIAGNOSIS — K219 Gastro-esophageal reflux disease without esophagitis: Secondary | ICD-10-CM | POA: Diagnosis not present

## 2020-04-11 DIAGNOSIS — R0602 Shortness of breath: Secondary | ICD-10-CM | POA: Diagnosis not present

## 2020-04-11 DIAGNOSIS — J45909 Unspecified asthma, uncomplicated: Secondary | ICD-10-CM | POA: Diagnosis not present

## 2020-04-11 DIAGNOSIS — E785 Hyperlipidemia, unspecified: Secondary | ICD-10-CM | POA: Diagnosis not present

## 2020-04-11 DIAGNOSIS — I129 Hypertensive chronic kidney disease with stage 1 through stage 4 chronic kidney disease, or unspecified chronic kidney disease: Secondary | ICD-10-CM | POA: Diagnosis not present

## 2020-04-11 DIAGNOSIS — N183 Chronic kidney disease, stage 3 unspecified: Secondary | ICD-10-CM | POA: Diagnosis not present

## 2020-04-11 DIAGNOSIS — I482 Chronic atrial fibrillation, unspecified: Secondary | ICD-10-CM | POA: Diagnosis not present

## 2020-04-11 DIAGNOSIS — I10 Essential (primary) hypertension: Secondary | ICD-10-CM | POA: Diagnosis not present

## 2020-04-11 NOTE — Progress Notes (Signed)
  Echocardiogram 2D Echocardiogram has been performed.  Rebekah Harrell 04/11/2020, 11:58 AM

## 2020-04-12 ENCOUNTER — Other Ambulatory Visit: Payer: Self-pay | Admitting: Family Medicine

## 2020-04-12 LAB — COMPLETE METABOLIC PANEL WITH GFR
AG Ratio: 1.4 (calc) (ref 1.0–2.5)
ALT: 13 U/L (ref 6–29)
AST: 18 U/L (ref 10–35)
Albumin: 4.3 g/dL (ref 3.6–5.1)
Alkaline phosphatase (APISO): 73 U/L (ref 37–153)
BUN/Creatinine Ratio: 17 (calc) (ref 6–22)
BUN: 20 mg/dL (ref 7–25)
CO2: 26 mmol/L (ref 20–32)
Calcium: 9.6 mg/dL (ref 8.6–10.4)
Chloride: 104 mmol/L (ref 98–110)
Creat: 1.16 mg/dL — ABNORMAL HIGH (ref 0.60–0.93)
GFR, Est African American: 52 mL/min/{1.73_m2} — ABNORMAL LOW (ref >=60)
GFR, Est Non African American: 45 mL/min/{1.73_m2} — ABNORMAL LOW (ref >=60)
Globulin: 3 g/dL (calc) (ref 1.9–3.7)
Glucose, Bld: 100 mg/dL — ABNORMAL HIGH (ref 65–99)
Potassium: 4.3 mmol/L (ref 3.5–5.3)
Sodium: 140 mmol/L (ref 135–146)
Total Bilirubin: 0.6 mg/dL (ref 0.2–1.2)
Total Protein: 7.3 g/dL (ref 6.1–8.1)

## 2020-04-12 LAB — LIPID PANEL
Cholesterol: 164 mg/dL (ref <200)
HDL: 46 mg/dL — ABNORMAL LOW (ref >=50)
LDL Cholesterol (Calc): 88 mg/dL (calc)
Non-HDL Cholesterol (Calc): 118 mg/dL (calc) (ref <130)
Total CHOL/HDL Ratio: 3.6 (calc) (ref <5.0)
Triglycerides: 200 mg/dL — ABNORMAL HIGH (ref <150)

## 2020-04-12 LAB — CBC
HCT: 41.7 % (ref 35.0–45.0)
Hemoglobin: 14 g/dL (ref 11.7–15.5)
MCH: 29.2 pg (ref 27.0–33.0)
MCHC: 33.6 g/dL (ref 32.0–36.0)
MCV: 87.1 fL (ref 80.0–100.0)
MPV: 10.6 fL (ref 7.5–12.5)
Platelets: 282 10*3/uL (ref 140–400)
RBC: 4.79 10*6/uL (ref 3.80–5.10)
RDW: 13.7 % (ref 11.0–15.0)
WBC: 9.3 10*3/uL (ref 3.8–10.8)

## 2020-04-12 LAB — TSH: TSH: 3.33 mIU/L (ref 0.40–4.50)

## 2020-04-18 ENCOUNTER — Other Ambulatory Visit: Payer: Self-pay | Admitting: Family Medicine

## 2020-04-19 ENCOUNTER — Other Ambulatory Visit: Payer: Self-pay

## 2020-04-19 ENCOUNTER — Encounter: Payer: Self-pay | Admitting: Family Medicine

## 2020-04-19 ENCOUNTER — Ambulatory Visit (INDEPENDENT_AMBULATORY_CARE_PROVIDER_SITE_OTHER): Payer: Medicare HMO | Admitting: Family Medicine

## 2020-04-19 VITALS — BP 129/55 | HR 54 | Ht 63.0 in | Wt 179.0 lb

## 2020-04-19 DIAGNOSIS — R609 Edema, unspecified: Secondary | ICD-10-CM

## 2020-04-19 DIAGNOSIS — I480 Paroxysmal atrial fibrillation: Secondary | ICD-10-CM | POA: Diagnosis not present

## 2020-04-19 DIAGNOSIS — S0990XA Unspecified injury of head, initial encounter: Secondary | ICD-10-CM | POA: Diagnosis not present

## 2020-04-19 DIAGNOSIS — H9312 Tinnitus, left ear: Secondary | ICD-10-CM | POA: Diagnosis not present

## 2020-04-19 DIAGNOSIS — R0602 Shortness of breath: Secondary | ICD-10-CM

## 2020-04-19 MED ORDER — FLUTICASONE-SALMETEROL 100-50 MCG/DOSE IN AEPB: 1.0000 | INHALATION_SPRAY | Freq: Two times a day (BID) | RESPIRATORY_TRACT | 2 refills | Status: DC

## 2020-04-19 MED ORDER — ALBUTEROL SULFATE HFA 108 (90 BASE) MCG/ACT IN AERS
2.0000 | INHALATION_SPRAY | Freq: Once | RESPIRATORY_TRACT | Status: AC
  Administered 2020-04-19: 11:00:00 2 via RESPIRATORY_TRACT

## 2020-04-19 NOTE — Assessment & Plan Note (Signed)
Possible reaction to Eliquis but unclear.  See note below.

## 2020-04-19 NOTE — Progress Notes (Signed)
Established Patient Office Visit  Subjective:  Patient ID: Rebekah Harrell, female    DOB: 1941-03-30  Age: 79 y.o. MRN: [id number]  CC:  Chief Complaint  Patient presents with  . Asthma  . Shortness of Breath    HPI Rebekah Harrell presents for spirometry today for persistent shortness of breath.  She has a history of atrial fibrillation which I do feel contributes to her shortness of breath she is also felt more deconditioned.  She had recently restarted Eliquis.  I had her come back in today so that we could do spirometry to evaluate her lungs.  He did have a chest x-ray on April 16 which was normal.  She also wanted to let me know that since restarting the Eliquis that she has had some swelling.  She says initially she woke up with the lower bottom lip and lower half of her face swelling.  She took an antihistamine and it seemed to improve.  Then she noticed some hives on her arms again took an antihistamine and it seemed to improve.  She does report that the day before the facial swelling she did drink a beer which she had not done in a really long time.  Then she noticed swelling in her feet and also again took antihistamines and it seemed to go away pretty quickly.  She feels like every 3 days or so she will notice swelling or hives in different places.  Otherwise no new medications or changes except for restarting the Eliquis.  She also wanted me to check the back of her head.  She is actually fallen quite some time ago and hit the back of her head on a coffee table.  She feels like she has a permanent dent and wanted me to check it and feel it.  She says it is no longer painful.  She also wanted let me know that ever since the head injury she has been having ear ringing particularly in the left ear.  She says it just sounds like "white noise"  Past Medical History:  Diagnosis Date  . Aortic atherosclerosis (HCC)   . Arthritis    "lower back; knees" (11/26/2016)  . Asthma   .  Chronic atrial fibrillation (HCC)   . CKD (chronic kidney disease), stage III    patient unaware of this on 11/26/2016  . Complication of anesthesia    "had trouble waking me up in the late 1990's after gallbladder OR"  . GERD (gastroesophageal reflux disease)   . Heart murmur    "dx'd when I was a little kid"  . History of hiatal hernia   . Hyperlipidemia   . Hypertension   . Hypoglycemia     Past Surgical History:  Procedure Laterality Date  . CHOLECYSTECTOMY OPEN  1990s  . TONSILLECTOMY    . TUBAL LIGATION      Family History  Problem Relation Age of Onset  . CAD Mother        HEART ATTACK  . Colon cancer Father        KIDNEY FAILURE/COLON CANCER  . Atrial fibrillation Brother   . Heart failure Maternal Grandmother   . Kidney failure Maternal Grandfather   . Heart attack Paternal Grandmother   . Healthy Brother     Social History   Socioeconomic History  . Marital status: Divorced    Spouse name: Not on file  . Number of children: 3  . Years of education: 78  . Highest  education level: Associate degree: academic program  Occupational History  . 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 Smoking status: Never Smoker  . Smokeless tobacco: Never Used  Substance and Sexual Activity  . Alcohol use: Not Currently  . Drug use: No  . Sexual activity: Not Currently  Other Topics Concern  . 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 Drinks tea during the day.   Social Determinants of Health   Financial Resource Strain:   . Difficulty of Paying Living Expenses:   Food Insecurity:   . Worried About Charity fundraiser in the Last Year:   . Arboriculturist in the Last Year:   Transportation Needs:   . Film/video editor (Medical):   Marland Kitchen Lack of Transportation (Non-Medical):   Physical Activity:   . Days of Exercise per Week:   . Minutes of Exercise per  Session:   Stress:   . Feeling of Stress :   Social Connections:   . Frequency of Communication with Friends and Family:   . Frequency of Social Gatherings with Friends and Family:   . Attends Religious Services:   . Active Member of Clubs or Organizations:   . Attends Archivist Meetings:   Marland Kitchen Marital Status:   Intimate Partner Violence:   . Fear of Current or Ex-Partner:   . Emotionally Abused:   Marland Kitchen Physically Abused:   . Sexually Abused:     Outpatient Medications Prior to Visit  Medication Sig Dispense Refill  . amLODipine (NORVASC) 10 MG tablet Take 1 tablet (10 mg total) by mouth daily. 90 tablet 3  . apixaban (ELIQUIS) 5 MG TABS tablet Take 1 tablet (5 mg total) by mouth 2 (two) times daily. 180 tablet 2  . CALCIUM PO Take 1 tablet by mouth at bedtime.    . Cholecalciferol (D-3-5) 5000 units capsule Take 5,000 Units by mouth daily.    Marland Kitchen diltiazem (CARDIZEM) 30 MG tablet Take 1 tablet every 4 hours AS NEEDED for heart rate >100 as long as top bp >100 45 tablet 1  . famotidine (PEPCID) 20 MG tablet TAKE 1 TABLET TWICE DAILY 180 tablet 1  . fexofenadine (ALLEGRA) 180 MG tablet TAKE 1 TABLET EVERY DAY 90 tablet 1  . hydrALAZINE (APRESOLINE) 50 MG tablet Take 1 tablet (50 mg total) by mouth 3 (three) times daily. TAKE 1 TABLET BY MOUTH THREE TIMES DAILY 270 tablet 0  . Magnesium 200 MG TABS Take 1 tablet (200 mg total) by mouth daily. 60 each   . montelukast (SINGULAIR) 10 MG tablet TAKE 1 TABLET AT BEDTIME 90 tablet 1  . Multiple Vitamins-Minerals (MULTIVITAMIN PO) Take 1 tablet by mouth at bedtime.    Marland Kitchen omeprazole (PRILOSEC) 20 MG capsule Take 1 capsule (20 mg total) by mouth daily. 90 capsule 3  . potassium chloride SA (KLOR-CON) 20 MEQ tablet TAKE 1 TABLET EVERY DAY 90 tablet 0  . simvastatin (ZOCOR) 20 MG tablet Take 1 tablet (20 mg total) by mouth every evening. Must schedule yearly exam and labs for refills 90 tablet 2  . sotalol (BETAPACE) 80 MG tablet Take 1 tablet  (80 mg total) by mouth 2 (two) times daily. 180 tablet 3   No facility-administered medications prior to visit.    Allergies  Allergen Reactions  . Ace Inhibitors Swelling  . Tramadol  Other (See Comments)    Night sweats, bad dreams  . Celecoxib Other (See Comments)    REACTION: hair loss  . Cetirizine Hcl Other (See Comments)    REACTION: fatigue  . Penicillins Other (See Comments)    Unknown  . Sulfonamide Derivatives Rash    ROS Review of Systems    Objective:    Physical Exam  Constitutional: She is oriented to person, place, and time. She appears well-developed and well-nourished.  HENT:  Head: Normocephalic and atraumatic.  EMs and canals are clear bilaterally.  Cardiovascular: Normal rate, regular rhythm and normal heart sounds.  Pulmonary/Chest: Effort normal and breath sounds normal.  Neurological: She is alert and oriented to person, place, and time.  Skin: Skin is warm and dry.  No hives today.   Psychiatric: She has a normal mood and affect. Her behavior is normal.    BP (!) 129/55   Pulse (!) 54   Ht 5\' 3"  (1.6 m)   Wt 179 lb (81.2 kg)   SpO2 97%   BMI 31.71 kg/m  Wt Readings from Last 3 Encounters:  04/19/20 179 lb (81.2 kg)  04/10/20 177 lb 3.2 oz (80.4 kg)  04/06/20 179 lb (81.2 kg)     Health Maintenance Due  Topic Date Due  . COVID-19 Vaccine (1) Never done  . PNA vac Low Risk Adult (1 of 2 - PCV13) Never done  . MAMMOGRAM  03/08/2020    There are no preventive care reminders to display for this patient.  Lab Results  Component Value Date   TSH 3.33 04/11/2020   Lab Results  Component Value Date   WBC 9.3 04/11/2020   HGB 14.0 04/11/2020   HCT 41.7 04/11/2020   MCV 87.1 04/11/2020   PLT 282 04/11/2020   Lab Results  Component Value Date   NA 140 04/11/2020   K 4.3 04/11/2020   CO2 26 04/11/2020   GLUCOSE 100 (H) 04/11/2020   BUN 20 04/11/2020   CREATININE 1.16 (H) 04/11/2020   BILITOT 0.6 04/11/2020   ALKPHOS 64  11/18/2017   AST 18 04/11/2020   ALT 13 04/11/2020   PROT 7.3 04/11/2020   ALBUMIN 4.3 11/18/2017   CALCIUM 9.6 04/11/2020   ANIONGAP 8 12/10/2016   Lab Results  Component Value Date   CHOL 164 04/11/2020   Lab Results  Component Value Date   HDL 46 (L) 04/11/2020   Lab Results  Component Value Date   LDLCALC 88 04/11/2020   Lab Results  Component Value Date   TRIG 200 (H) 04/11/2020   Lab Results  Component Value Date   CHOLHDL 3.6 04/11/2020   Lab Results  Component Value Date   HGBA1C 5.8 (H) 06/19/2016      Assessment & Plan:   Problem List Items Addressed This Visit      Cardiovascular and Mediastinum   Atrial fibrillation (HCC)    Possible reaction to Eliquis but unclear.  See note below.       Other Visit Diagnoses    SOB (shortness of breath)    -  Primary   Relevant Medications   albuterol (VENTOLIN HFA) 108 (90 Base) MCG/ACT inhaler 2 puff (Completed)   Other Relevant Orders   PR EVAL OF BRONCHOSPASM   Injury of head, initial encounter       Swelling       Tinnitus of left ear          Swelling/hives-unclear if it could be the Eliquis  or not.  She says for now she just wants to keep an eye on it and she will just use her antihistamine as needed.  If she is noticing the reaction is more severe or is returning more frequently that I did encourage her to call me back so that we could consider changing her medication to an alternative again no other medications have been changed or altered recently.  Tinnitus, left ear that started after head injury.  Ear exam itself is normal everything is reassuring.  Sometimes this will cause it but it may also have been coincidental.  Discussed with her that 95% of the time we do not find a cause for ear ringing but I am more than happy to refer her to ENT at any point that she would like especially if she feels like it is getting worse or getting louder.  She denies any hearing loss in that ear.  Shortness of  breath-spirometry performed today.  Patient tolerated well.  Spirometry today shows an FVC of 78% similar to previous FVC recorded in 2017 of 74%.  FEV1 of 74% today compared to 61% FEV1 back in 2017.  Ratio of 70% today her test back in 2017 showed a very brisk response to albuterol with a 17% improvement in FEV1.  Today she did not have any improvement in FEV1 after albuterol.  Though she felt like she struggled to do well with the test today.  Discussed putting her on a daily inhaler for the next month or 2 to see if she feels less symptomatic with her shortness of breath I think this would be a reasonable option she does have some moderate obstruction.  Meds ordered this encounter  Medications  . albuterol (VENTOLIN HFA) 108 (90 Base) MCG/ACT inhaler 2 puff  . Fluticasone-Salmeterol (ADVAIR) 100-50 MCG/DOSE AEPB    Sig: Inhale 1 puff into the lungs 2 (two) times daily.    Dispense:  1 each    Refill:  2    Follow-up: Return in about 4 weeks (around 05/17/2020) for breathing issues.    Nani Gasser, MD

## 2020-05-01 ENCOUNTER — Other Ambulatory Visit: Payer: Self-pay

## 2020-05-01 MED ORDER — SIMVASTATIN 20 MG PO TABS: 20.0000 mg | ORAL_TABLET | Freq: Every evening | ORAL | 3 refills | Status: DC

## 2020-05-07 ENCOUNTER — Other Ambulatory Visit: Payer: Self-pay | Admitting: Cardiology

## 2020-05-07 ENCOUNTER — Other Ambulatory Visit: Payer: Self-pay | Admitting: Family Medicine

## 2020-05-17 ENCOUNTER — Other Ambulatory Visit: Payer: Self-pay | Admitting: Cardiology

## 2020-05-17 ENCOUNTER — Ambulatory Visit: Payer: Medicare HMO | Admitting: Family Medicine

## 2020-05-17 DIAGNOSIS — I10 Essential (primary) hypertension: Secondary | ICD-10-CM

## 2020-05-31 ENCOUNTER — Ambulatory Visit: Payer: Medicare HMO | Admitting: Family Medicine

## 2020-06-05 ENCOUNTER — Ambulatory Visit (INDEPENDENT_AMBULATORY_CARE_PROVIDER_SITE_OTHER): Payer: Medicare HMO

## 2020-06-05 ENCOUNTER — Other Ambulatory Visit: Payer: Self-pay

## 2020-06-05 ENCOUNTER — Encounter: Payer: Self-pay | Admitting: Family Medicine

## 2020-06-05 ENCOUNTER — Ambulatory Visit (INDEPENDENT_AMBULATORY_CARE_PROVIDER_SITE_OTHER): Payer: Medicare HMO | Admitting: Family Medicine

## 2020-06-05 VITALS — BP 131/48 | HR 59 | Ht 63.0 in | Wt 179.0 lb

## 2020-06-05 DIAGNOSIS — M5441 Lumbago with sciatica, right side: Secondary | ICD-10-CM | POA: Diagnosis not present

## 2020-06-05 DIAGNOSIS — M5442 Lumbago with sciatica, left side: Secondary | ICD-10-CM | POA: Diagnosis not present

## 2020-06-05 DIAGNOSIS — G8929 Other chronic pain: Secondary | ICD-10-CM

## 2020-06-05 DIAGNOSIS — M545 Low back pain: Secondary | ICD-10-CM | POA: Diagnosis not present

## 2020-06-05 DIAGNOSIS — J449 Chronic obstructive pulmonary disease, unspecified: Secondary | ICD-10-CM | POA: Diagnosis not present

## 2020-06-05 MED ORDER — BUDESONIDE-FORMOTEROL FUMARATE 80-4.5 MCG/ACT IN AERO: 2.0000 | INHALATION_SPRAY | Freq: Two times a day (BID) | RESPIRATORY_TRACT | 3 refills | Status: DC

## 2020-06-05 NOTE — Assessment & Plan Note (Signed)
We will discontinue Advair and try Symbicort she might do better with an aerosolized liquid instead of a powder inhaler.  I really like for her again to give this a try for 6 to 8 weeks to see if she feels like it is helpful at all with the shortness of breath that she is experiencing with activity.  Again I still think her A. fib is contributing some to this.  We did discuss trying to get up and move and avoiding being sedentary during the daytime.

## 2020-06-05 NOTE — Progress Notes (Signed)
Established Patient Office Visit  Subjective:  Patient ID: Rebekah Harrell, female    DOB: 1941-03-21  Age: 79 y.o. MRN: [id number]  CC:  Chief Complaint  Patient presents with  . COPD    she reports that the Advair causes her to cough and she hasn't been consistent with taking it    HPI DERHONDA EASTLICK presents for f/u of SOB.  This is a 6 week follow-up for shortness of breath.  We performed spirometry about 6 weeks ago.  She couldn't use the Advair because it made her cough.  Most consistent with some mild COPD.  She said she tried the Advair but said it would make her cough so she quit using it and has not been using it consistently.  She said that she would cough just right after using it it did not persist.  She says it also made her tongue feel like it was tingling.   Past Medical History:  Diagnosis Date  . Aortic atherosclerosis ([place])   . Arthritis    "lower back; knees" (11/26/2016)  . Asthma   . Chronic atrial fibrillation ([place])   . CKD (chronic kidney disease), stage III    patient unaware of this on 11/26/2016  . Complication of anesthesia    "had trouble waking me up in the late 1990's after gallbladder OR"  . GERD (gastroesophageal reflux disease)   . Heart murmur    "dx'd when I was a little kid"  . History of hiatal hernia   . Hyperlipidemia   . Hypertension   . Hypoglycemia     Past Surgical History:  Procedure Laterality Date  . CHOLECYSTECTOMY OPEN  1990s  . TONSILLECTOMY    . TUBAL LIGATION      Family History  Problem Relation Age of Onset  . CAD Mother        HEART ATTACK  . Colon cancer Father        KIDNEY FAILURE/COLON CANCER  . Atrial fibrillation Brother   . Heart failure Maternal Grandmother   . Kidney failure Maternal Grandfather   . Heart attack Paternal Grandmother   . Healthy Brother     Social History   Socioeconomic History  . Marital status: Divorced    Spouse name: Not on file  . Number of children: 3  . Years of  education: 42  . Highest education level: Associate degree: academic program  Occupational History  . 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 Smoking status: Never Smoker  . Smokeless tobacco: Never Used  Vaping Use  . Vaping Use: Never used  Substance and Sexual Activity  . Alcohol use: Not Currently  . Drug use: No  . Sexual activity: Not Currently  Other Topics Concern  . 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 Drinks tea during the day.   Social Determinants of Health   Financial Resource Strain:   . Difficulty of Paying Living Expenses:   Food Insecurity:   . Worried About Charity fundraiser in the Last Year:   . Arboriculturist in the Last Year:   Transportation Needs:   . Film/video editor (Medical):   Marland Kitchen Lack of Transportation (Non-Medical):   Physical Activity:   . Days of Exercise per Week:   . Minutes of Exercise per Session:   Stress:   .  Feeling of Stress :   Social Connections:   . Frequency of Communication with Friends and Family:   . Frequency of Social Gatherings with Friends and Family:   . Attends Religious Services:   . Active Member of Clubs or Organizations:   . Attends Banker Meetings:   Marland Kitchen Marital Status:   Intimate Partner Violence:   . Fear of Current or Ex-Partner:   . Emotionally Abused:   Marland Kitchen Physically Abused:   . Sexually Abused:     Outpatient Medications Prior to Visit  Medication Sig Dispense Refill  . ALLERGY RELIEF 180 MG tablet TAKE 1 TABLET EVERY DAY 90 tablet 1  . amLODipine (NORVASC) 10 MG tablet TAKE 1 TABLET (10 MG TOTAL) BY MOUTH DAILY. 90 tablet 0  . apixaban (ELIQUIS) 5 MG TABS tablet Take 1 tablet (5 mg total) by mouth 2 (two) times daily. 180 tablet 2  . CALCIUM PO Take 1 tablet by mouth at bedtime.    . Cholecalciferol (D-3-5) 5000 units capsule Take 5,000 Units by mouth daily.    Marland Kitchen diltiazem  (CARDIZEM) 30 MG tablet Take 1 tablet every 4 hours AS NEEDED for heart rate >100 as long as top bp >100 45 tablet 1  . famotidine (PEPCID) 20 MG tablet TAKE 1 TABLET TWICE DAILY 180 tablet 1  . hydrALAZINE (APRESOLINE) 50 MG tablet Take 1 tablet (50 mg total) by mouth 3 (three) times daily. TAKE 1 TABLET BY MOUTH THREE TIMES DAILY 270 tablet 0  . Magnesium 200 MG TABS Take 1 tablet (200 mg total) by mouth daily. 60 each   . montelukast (SINGULAIR) 10 MG tablet TAKE 1 TABLET AT BEDTIME 90 tablet 1  . Multiple Vitamins-Minerals (MULTIVITAMIN PO) Take 1 tablet by mouth at bedtime.    Marland Kitchen omeprazole (PRILOSEC) 20 MG capsule Take 1 capsule (20 mg total) by mouth daily. 90 capsule 3  . potassium chloride SA (KLOR-CON) 20 MEQ tablet TAKE 1 TABLET EVERY DAY 90 tablet 0  . simvastatin (ZOCOR) 20 MG tablet Take 1 tablet (20 mg total) by mouth every evening. Must schedule yearly exam and labs for refills 90 tablet 3  . sotalol (BETAPACE) 80 MG tablet TAKE 1 TABLET (80 MG TOTAL) BY MOUTH 2 (TWO) TIMES DAILY. 180 tablet 0  . Fluticasone-Salmeterol (ADVAIR) 100-50 MCG/DOSE AEPB Inhale 1 puff into the lungs 2 (two) times daily. (Patient not taking: Reported on 06/05/2020) 1 each 2   No facility-administered medications prior to visit.    Allergies  Allergen Reactions  . Ace Inhibitors Swelling  . Tramadol Other (See Comments)    Night sweats, bad dreams  . Celecoxib Other (See Comments)    REACTION: hair loss  . Cetirizine Hcl Other (See Comments)    REACTION: fatigue  . Penicillins Other (See Comments)    Unknown  . Sulfonamide Derivatives Rash    ROS Review of Systems    Objective:    Physical Exam Constitutional:      Appearance: She is well-developed.  HENT:     Head: Normocephalic and atraumatic.  Cardiovascular:     Rate and Rhythm: Normal rate and regular rhythm.     Heart sounds: Normal heart sounds.  Pulmonary:     Effort: Pulmonary effort is normal.     Breath sounds: Normal  breath sounds.  Skin:    General: Skin is warm and dry.  Neurological:     Mental Status: She is alert and oriented to person, place, and time.  Psychiatric:        Behavior: Behavior normal.     BP (!) 131/48   Pulse (!) 59   Ht 5\' 3"  (1.6 m)   Wt 179 lb (81.2 kg)   SpO2 99%   BMI 31.71 kg/m  Wt Readings from Last 3 Encounters:  06/05/20 179 lb (81.2 kg)  04/19/20 179 lb (81.2 kg)  04/10/20 177 lb 3.2 oz (80.4 kg)     Health Maintenance Due  Topic Date Due  . Hepatitis C Screening  Never done  . COVID-19 Vaccine (1) Never done  . PNA vac Low Risk Adult (1 of 2 - PCV13) Never done  . MAMMOGRAM  03/08/2020    There are no preventive care reminders to display for this patient.  Lab Results  Component Value Date   TSH 3.33 04/11/2020   Lab Results  Component Value Date   WBC 9.3 04/11/2020   HGB 14.0 04/11/2020   HCT 41.7 04/11/2020   MCV 87.1 04/11/2020   PLT 282 04/11/2020   Lab Results  Component Value Date   NA 140 04/11/2020   K 4.3 04/11/2020   CO2 26 04/11/2020   GLUCOSE 100 (H) 04/11/2020   BUN 20 04/11/2020   CREATININE 1.16 (H) 04/11/2020   BILITOT 0.6 04/11/2020   ALKPHOS 64 11/18/2017   AST 18 04/11/2020   ALT 13 04/11/2020   PROT 7.3 04/11/2020   ALBUMIN 4.3 11/18/2017   CALCIUM 9.6 04/11/2020   ANIONGAP 8 12/10/2016   Lab Results  Component Value Date   CHOL 164 04/11/2020   Lab Results  Component Value Date   HDL 46 (L) 04/11/2020   Lab Results  Component Value Date   LDLCALC 88 04/11/2020   Lab Results  Component Value Date   TRIG 200 (H) 04/11/2020   Lab Results  Component Value Date   CHOLHDL 3.6 04/11/2020   Lab Results  Component Value Date   HGBA1C 5.8 (H) 06/19/2016      Assessment & Plan:   Problem List Items Addressed This Visit      Respiratory   COPD (chronic obstructive pulmonary disease) (HCC) - Primary    We will discontinue Advair and try Symbicort she might do better with an aerosolized liquid  instead of a powder inhaler.  I really like for her again to give this a try for 6 to 8 weeks to see if she feels like it is helpful at all with the shortness of breath that she is experiencing with activity.  Again I still think her A. fib is contributing some to this.  We did discuss trying to get up and move and avoiding being sedentary during the daytime.      Relevant Medications   budesonide-formoterol (SYMBICORT) 80-4.5 MCG/ACT inhaler     Other   Chronic low back pain    Discussed chronic low back pain today she feels like it is gradually getting worse.  Previously a couple of Tylenol would help provide some relief so that she could be functional but now she says the only thing that provides relief is if she takes 3 Tylenol and she knows that that is too much.  She says is gotten to the point where she cannot clean her house she cannot do a lot of heavy lifting.  She has tried physical therapy in the past but was not helpful.  She says a TENS unit can be helpful and she uses it occasionally.  She says occasionally the  pain will radiate down into her legs and sometimes it will radiate up into her back.  It is mostly bilateral across the midline, low back pain      Relevant Orders   DG Lumbar Spine Complete      Looked back and it has actually been about 6 years since she had films of her low back so we will get those updated today and then get her in with one of our sports med docs to discuss possible injections as an option if she would is a candidate.  Again she had tried PT in the past and did not feel like it was helpful.  Meds ordered this encounter  Medications  . budesonide-formoterol (SYMBICORT) 80-4.5 MCG/ACT inhaler    Sig: Inhale 2 puffs into the lungs 2 (two) times daily.    Dispense:  1 Inhaler    Refill:  3    Follow-up: Return in about 4 weeks (around 07/03/2020) for breathing.    Nani Gasser, MD

## 2020-06-05 NOTE — Assessment & Plan Note (Signed)
Discussed chronic low back pain today she feels like it is gradually getting worse.  Previously a couple of Tylenol would help provide some relief so that she could be functional but now she says the only thing that provides relief is if she takes 3 Tylenol and she knows that that is too much.  She says is gotten to the point where she cannot clean her house she cannot do a lot of heavy lifting.  She has tried physical therapy in the past but was not helpful.  She says a TENS unit can be helpful and she uses it occasionally.  She says occasionally the pain will radiate down into her legs and sometimes it will radiate up into her back.  It is mostly bilateral across the midline, low back pain

## 2020-06-05 NOTE — Patient Instructions (Signed)
Please schedule with Dr. Benjamin Stain for your back.  You can go ahead and get x-rays done today and then follow-up with him either later this week or early next week.

## 2020-06-06 ENCOUNTER — Other Ambulatory Visit: Payer: Self-pay | Admitting: Family Medicine

## 2020-06-11 ENCOUNTER — Encounter: Payer: Self-pay | Admitting: Sports Medicine

## 2020-06-11 ENCOUNTER — Ambulatory Visit (INDEPENDENT_AMBULATORY_CARE_PROVIDER_SITE_OTHER): Payer: Medicare HMO | Admitting: Sports Medicine

## 2020-06-11 DIAGNOSIS — M4316 Spondylolisthesis, lumbar region: Secondary | ICD-10-CM

## 2020-06-11 NOTE — Assessment & Plan Note (Signed)
This is a pleasant 79 year old female, she has a long history of low back pain, axial, worse with standing and walking but better with sitting, flexion, Valsalva, nothing overtly radicular, it does radiate up her paraspinal musculature. She has not found good relief with Tylenol, she has also done extensive physical therapy without any improvement. On further questioning she has been using a new CBD oil and feels like it might be helping, she will contact the manufacturer to determine the frequency of dosing, and call me in a couple of weeks if she is not noting good relief of her back pain. I did explain to her that any response from a topical CBD oil may be placebo response, as the high-quality clinical studies have not been performed to improve that they are better than a topical placebo, however it was unlikely to be harmful. She does report that she had fantastic relief with tramadol in the past but developed some night sweats and bad dreams, she feels as though she can live with these side effects, so I am happy to call in some tramadol should her CBD oil not provide good relief over the next couple of weeks.

## 2020-06-11 NOTE — Progress Notes (Signed)
    Procedures performed today:    None.  Independent interpretation of notes and tests performed by another provider:   X-rays personally reviewed, she has multilevel lumbar DDD, worst at the L4-L5 level with grade 1 spondylolisthesis.  Brief History, Exam, Impression, and Recommendations:    Spondylolisthesis at L4-L5 level This is a pleasant 79 year old female, she has a long history of low back pain, axial, worse with standing and walking but better with sitting, flexion, Valsalva, nothing overtly radicular, it does radiate up her paraspinal musculature. She has not found good relief with Tylenol, she has also done extensive physical therapy without any improvement. On further questioning she has been using a new CBD oil and feels like it might be helping, she will contact the manufacturer to determine the frequency of dosing, and call me in a couple of weeks if she is not noting good relief of her back pain. I did explain to her that any response from a topical CBD oil may be placebo response, as the high-quality clinical studies have not been performed to improve that they are better than a topical placebo, however it was unlikely to be harmful. She does report that she had fantastic relief with tramadol in the past but developed some night sweats and bad dreams, she feels as though she can live with these side effects, so I am happy to call in some tramadol should her CBD oil not provide good relief over the next couple of weeks.    ___________________________________________ Ihor Austin. Benjamin Stain, M.D., ABFM., CAQSM. Primary Care and Sports Medicine [facility]  Adjunct Instructor of Family Medicine  [employer]

## 2020-07-09 ENCOUNTER — Other Ambulatory Visit: Payer: Self-pay | Admitting: Cardiology

## 2020-07-11 ENCOUNTER — Other Ambulatory Visit: Payer: Self-pay | Admitting: Family Medicine

## 2020-07-11 ENCOUNTER — Ambulatory Visit: Payer: Medicare HMO | Admitting: Family Medicine

## 2020-07-13 ENCOUNTER — Other Ambulatory Visit: Payer: Self-pay | Admitting: Family Medicine

## 2020-07-17 NOTE — Progress Notes (Signed)
HPI: FU atrial fibrillation. Patient with new diagnosis of atrial fibrillation in June 2017. Stress echocardiogram 2011 showed poor exercise tolerance but no ischemia. Admitted December 2017 with atrial fibrillation with rapid ventricular response and acute diastolic congestive heart failure. Patient was placed on sotalol and converted to sinus rhythm. Sotalol dose was decreased to 80 mg twice a day because of prolonged QT interval. Metoprolol was discontinued because of bradycardia. Developed angioedema with lisinopril. Echocardiogram April 2021 showed normal LV function, moderate left atrial enlargement, mild to moderate mitral regurgitation.  Patient seen in April with recurrent atrial fibrillation but converted spontaneously.  Since last seen, she has some dyspnea with more vigorous activities.  No orthopnea, PND, increased pedal edema or chest pain.  She has had one subsequent episode of atrial fibrillation and converted spontaneously by her report.  Current Outpatient Medications  Medication Sig Dispense Refill  . ALLERGY RELIEF 180 MG tablet TAKE 1 TABLET EVERY DAY 90 tablet 1  . amLODipine (NORVASC) 10 MG tablet TAKE 1 TABLET EVERY DAY 90 tablet 0  . apixaban (ELIQUIS) 5 MG TABS tablet Take 1 tablet (5 mg total) by mouth 2 (two) times daily. 180 tablet 2  . budesonide-formoterol (SYMBICORT) 80-4.5 MCG/ACT inhaler Inhale 2 puffs into the lungs 2 (two) times daily. 1 Inhaler 3  . CALCIUM PO Take 1 tablet by mouth at bedtime.    . Cholecalciferol (D-3-5) 5000 units capsule Take 5,000 Units by mouth daily.    Marland Kitchen diltiazem (CARDIZEM) 30 MG tablet Take 1 tablet every 4 hours AS NEEDED for heart rate >100 as long as top bp >100 45 tablet 1  . famotidine (PEPCID) 20 MG tablet TAKE 1 TABLET TWICE DAILY 180 tablet 1  . hydrALAZINE (APRESOLINE) 50 MG tablet Take 1 tablet (50 mg total) by mouth 3 (three) times daily. TAKE 1 TABLET BY MOUTH THREE TIMES DAILY 270 tablet 0  . Magnesium 200 MG TABS  Take 1 tablet (200 mg total) by mouth daily. 60 each   . montelukast (SINGULAIR) 10 MG tablet TAKE 1 TABLET AT BEDTIME 90 tablet 1  . Multiple Vitamins-Minerals (MULTIVITAMIN PO) Take 1 tablet by mouth at bedtime.    Marland Kitchen omeprazole (PRILOSEC) 20 MG capsule Take 1 capsule (20 mg total) by mouth daily. 90 capsule 3  . potassium chloride SA (KLOR-CON) 20 MEQ tablet TAKE 1 TABLET EVERY DAY 90 tablet 0  . simvastatin (ZOCOR) 20 MG tablet Take 1 tablet (20 mg total) by mouth every evening. Must schedule yearly exam and labs for refills 90 tablet 3  . sotalol (BETAPACE) 80 MG tablet TAKE 1 TABLET (80 MG TOTAL) BY MOUTH 2 (TWO) TIMES DAILY. 180 tablet 0   No current facility-administered medications for this visit.     Past Medical History:  Diagnosis Date  . Aortic atherosclerosis (HCC)   . Arthritis    "lower back; knees" (11/26/2016)  . Asthma   . Chronic atrial fibrillation (HCC)   . CKD (chronic kidney disease), stage III    patient unaware of this on 11/26/2016  . Complication of anesthesia    "had trouble waking me up in the late 1990's after gallbladder OR"  . GERD (gastroesophageal reflux disease)   . Heart murmur    "dx'd when I was a little kid"  . History of hiatal hernia   . Hyperlipidemia   . Hypertension   . Hypoglycemia     Past Surgical History:  Procedure Laterality Date  . CHOLECYSTECTOMY OPEN  1990s  . TONSILLECTOMY    . TUBAL LIGATION      Social History   Socioeconomic History  . Marital status: Divorced    Spouse name: Not on file  . Number of children: 3  . Years of education: 59  . Highest education level: Associate degree: academic program  Occupational History  . 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 Smoking status: Never Smoker  . Smokeless tobacco: Never Used  Vaping Use  . Vaping Use: Never used  Substance and Sexual Activity  . Alcohol use: Not Currently  . Drug use: No  . Sexual activity: Not  Currently  Other Topics Concern  . 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 Drinks tea during the day.   Social Determinants of Health   Financial Resource Strain:   . Difficulty of Paying Living Expenses:   Food Insecurity:   . Worried About Programme researcher, broadcasting/film/video in the Last Year:   . Barista in the Last Year:   Transportation Needs:   . Freight forwarder (Medical):   Marland Kitchen Lack of Transportation (Non-Medical):   Physical Activity:   . Days of Exercise per Week:   . Minutes of Exercise per Session:   Stress:   . Feeling of Stress :   Social Connections:   . Frequency of Communication with Friends and Family:   . Frequency of Social Gatherings with Friends and Family:   . Attends Religious Services:   . Active Member of Clubs or Organizations:   . Attends Banker Meetings:   Marland Kitchen Marital Status:   Intimate Partner Violence:   . Fear of Current or Ex-Partner:   . Emotionally Abused:   Marland Kitchen Physically Abused:   . Sexually Abused:     Family History  Problem Relation Age of Onset  . CAD Mother        HEART ATTACK  . Colon cancer Father        KIDNEY FAILURE/COLON CANCER  . Atrial fibrillation Brother   . Heart failure Maternal Grandmother   . Kidney failure Maternal Grandfather   . Heart attack Paternal Grandmother   . Healthy Brother     ROS: no fevers or chills, productive cough, hemoptysis, dysphasia, odynophagia, melena, hematochezia, dysuria, hematuria, rash, seizure activity, orthopnea, PND, pedal edema, claudication. Remaining systems are negative.  Physical Exam: Well-developed well-nourished in no acute distress.  Skin is warm and dry.  HEENT is normal.  Neck is supple.  Chest is clear to auscultation with normal expansion.  Cardiovascular exam is regular rate and rhythm.  Abdominal exam nontender or distended. No masses palpated. Extremities show no edema. neuro grossly  intact  ECG-sinus bradycardia at a rate of 51, no ST changes, normal QT interval.  Personally reviewed  A/P  1 paroxysmal atrial fibrillation-patient remains in sinus rhythm.  Continue sotalol at present dose.  Continue apixaban 5 mg twice daily.  If she has more frequent episodes in the future may need to consider changing antiarrhythmics (could consider Tikosyn) versus ablation.  For now we will continue present regimen.  2 hypertension-patient's blood pressure is controlled.  Continue present medical regimen and follow.  3 hyperlipidemia-continue statin.  4 history of chronic diastolic congestive heart failure-euvolemic. Continue lasix as needed.  Olga Millers, MD

## 2020-07-25 ENCOUNTER — Other Ambulatory Visit: Payer: Self-pay

## 2020-07-25 ENCOUNTER — Ambulatory Visit (INDEPENDENT_AMBULATORY_CARE_PROVIDER_SITE_OTHER): Payer: Medicare HMO | Admitting: Cardiology

## 2020-07-25 ENCOUNTER — Encounter: Payer: Self-pay | Admitting: Cardiology

## 2020-07-25 VITALS — BP 124/60 | HR 54 | Ht 63.0 in | Wt 174.1 lb

## 2020-07-25 DIAGNOSIS — I480 Paroxysmal atrial fibrillation: Secondary | ICD-10-CM

## 2020-07-25 DIAGNOSIS — I10 Essential (primary) hypertension: Secondary | ICD-10-CM | POA: Diagnosis not present

## 2020-07-25 DIAGNOSIS — E7800 Pure hypercholesterolemia, unspecified: Secondary | ICD-10-CM

## 2020-07-25 DIAGNOSIS — I5032 Chronic diastolic (congestive) heart failure: Secondary | ICD-10-CM | POA: Diagnosis not present

## 2020-07-25 NOTE — Patient Instructions (Signed)
Medication Instructions:  NO CHANGE *If you need a refill on your cardiac medications before your next appointment, please call your pharmacy*   Lab Work: If you have labs (blood work) drawn today and your tests are completely normal, you will receive your results only by: . MyChart Message (if you have MyChart) OR . A paper copy in the mail If you have any lab test that is abnormal or we need to change your treatment, we will call you to review the results.   Follow-Up: At [facility], you and your health needs are our priority.  As part of our continuing mission to provide you with exceptional heart care, we have created designated Provider Care Teams.  These Care Teams include your primary Cardiologist (physician) and Advanced Practice Providers (APPs -  Physician Assistants and Nurse Practitioners) who all work together to provide you with the care you need, when you need it.  We recommend signing up for the patient portal called "MyChart".  Sign up information is provided on this After Visit Summary.  MyChart is used to connect with patients for Virtual Visits (Telemedicine).  Patients are able to view lab/test results, encounter notes, upcoming appointments, etc.  Non-urgent messages can be sent to your provider as well.   To learn more about what you can do with MyChart, go to [email or web address].    Your next appointment:   6 month(s)  The format for your next appointment:   In Person  Provider:   Brian Crenshaw, MD    

## 2020-07-30 ENCOUNTER — Ambulatory Visit: Payer: Medicare HMO | Admitting: Cardiology

## 2020-07-31 ENCOUNTER — Other Ambulatory Visit: Payer: Self-pay | Admitting: Cardiology

## 2020-07-31 DIAGNOSIS — I10 Essential (primary) hypertension: Secondary | ICD-10-CM

## 2020-09-06 ENCOUNTER — Other Ambulatory Visit: Payer: Self-pay | Admitting: Family Medicine

## 2020-09-19 ENCOUNTER — Other Ambulatory Visit: Payer: Self-pay | Admitting: Family Medicine

## 2020-09-19 ENCOUNTER — Other Ambulatory Visit: Payer: Self-pay | Admitting: Cardiology

## 2020-10-23 ENCOUNTER — Other Ambulatory Visit: Payer: Self-pay | Admitting: Cardiology

## 2020-11-12 ENCOUNTER — Other Ambulatory Visit: Payer: Self-pay | Admitting: Family Medicine

## 2020-11-28 ENCOUNTER — Other Ambulatory Visit: Payer: Self-pay | Admitting: Family Medicine

## 2021-01-28 ENCOUNTER — Other Ambulatory Visit: Payer: Self-pay | Admitting: Family Medicine

## 2021-02-06 ENCOUNTER — Telehealth: Payer: Self-pay | Admitting: Family Medicine

## 2021-02-06 DIAGNOSIS — L508 Other urticaria: Secondary | ICD-10-CM

## 2021-02-06 NOTE — Telephone Encounter (Signed)
Pt called and left a voicemail stating she has a rash and red around her eyes and having hair loss, and is not sure if she needs to see her PCP or she just needs a referral to her dermatologist. Please advise patient

## 2021-02-06 NOTE — Telephone Encounter (Signed)
He can take a look at it here first.  Please schedule her for next week.  Or we do have an 8:00 slot open tomorrow.

## 2021-02-06 NOTE — Telephone Encounter (Signed)
Referral for Dr. Lacretia Nicks placed

## 2021-02-06 NOTE — Telephone Encounter (Signed)
Referral placed.

## 2021-02-13 DIAGNOSIS — L298 Other pruritus: Secondary | ICD-10-CM | POA: Diagnosis not present

## 2021-02-13 DIAGNOSIS — L508 Other urticaria: Secondary | ICD-10-CM | POA: Diagnosis not present

## 2021-02-13 DIAGNOSIS — L299 Pruritus, unspecified: Secondary | ICD-10-CM | POA: Diagnosis not present

## 2021-02-13 DIAGNOSIS — Z8679 Personal history of other diseases of the circulatory system: Secondary | ICD-10-CM | POA: Diagnosis not present

## 2021-02-13 DIAGNOSIS — L659 Nonscarring hair loss, unspecified: Secondary | ICD-10-CM | POA: Diagnosis not present

## 2021-02-14 ENCOUNTER — Other Ambulatory Visit: Payer: Self-pay | Admitting: Family Medicine

## 2021-02-20 DIAGNOSIS — L298 Other pruritus: Secondary | ICD-10-CM | POA: Diagnosis not present

## 2021-02-20 DIAGNOSIS — T783XXA Angioneurotic edema, initial encounter: Secondary | ICD-10-CM | POA: Diagnosis not present

## 2021-02-20 DIAGNOSIS — L508 Other urticaria: Secondary | ICD-10-CM | POA: Diagnosis not present

## 2021-02-20 DIAGNOSIS — L239 Allergic contact dermatitis, unspecified cause: Secondary | ICD-10-CM | POA: Diagnosis not present

## 2021-02-20 DIAGNOSIS — J3089 Other allergic rhinitis: Secondary | ICD-10-CM | POA: Diagnosis not present

## 2021-02-20 DIAGNOSIS — J301 Allergic rhinitis due to pollen: Secondary | ICD-10-CM | POA: Diagnosis not present

## 2021-02-20 DIAGNOSIS — J3081 Allergic rhinitis due to animal (cat) (dog) hair and dander: Secondary | ICD-10-CM | POA: Diagnosis not present

## 2021-02-26 DIAGNOSIS — L239 Allergic contact dermatitis, unspecified cause: Secondary | ICD-10-CM | POA: Diagnosis not present

## 2021-02-28 DIAGNOSIS — L239 Allergic contact dermatitis, unspecified cause: Secondary | ICD-10-CM | POA: Diagnosis not present

## 2021-03-04 ENCOUNTER — Other Ambulatory Visit: Payer: Self-pay | Admitting: Family Medicine

## 2021-03-05 DIAGNOSIS — J3089 Other allergic rhinitis: Secondary | ICD-10-CM | POA: Diagnosis not present

## 2021-03-05 DIAGNOSIS — J3081 Allergic rhinitis due to animal (cat) (dog) hair and dander: Secondary | ICD-10-CM | POA: Diagnosis not present

## 2021-03-05 DIAGNOSIS — J301 Allergic rhinitis due to pollen: Secondary | ICD-10-CM | POA: Diagnosis not present

## 2021-03-05 DIAGNOSIS — L239 Allergic contact dermatitis, unspecified cause: Secondary | ICD-10-CM | POA: Diagnosis not present

## 2021-04-10 ENCOUNTER — Other Ambulatory Visit: Payer: Self-pay | Admitting: Family Medicine

## 2021-04-15 ENCOUNTER — Other Ambulatory Visit: Payer: Self-pay | Admitting: Family Medicine

## 2021-04-26 ENCOUNTER — Ambulatory Visit (INDEPENDENT_AMBULATORY_CARE_PROVIDER_SITE_OTHER): Payer: Medicare HMO | Admitting: Family Medicine

## 2021-04-26 ENCOUNTER — Encounter: Payer: Self-pay | Admitting: Family Medicine

## 2021-04-26 ENCOUNTER — Other Ambulatory Visit: Payer: Self-pay

## 2021-04-26 VITALS — BP 113/44 | HR 55 | Ht 63.0 in | Wt 178.0 lb

## 2021-04-26 DIAGNOSIS — Z1159 Encounter for screening for other viral diseases: Secondary | ICD-10-CM | POA: Diagnosis not present

## 2021-04-26 DIAGNOSIS — Z1231 Encounter for screening mammogram for malignant neoplasm of breast: Secondary | ICD-10-CM

## 2021-04-26 DIAGNOSIS — I10 Essential (primary) hypertension: Secondary | ICD-10-CM

## 2021-04-26 DIAGNOSIS — Z0000 Encounter for general adult medical examination without abnormal findings: Secondary | ICD-10-CM | POA: Diagnosis not present

## 2021-04-26 DIAGNOSIS — J449 Chronic obstructive pulmonary disease, unspecified: Secondary | ICD-10-CM | POA: Diagnosis not present

## 2021-04-26 NOTE — Progress Notes (Signed)
Subjective:     Rebekah Harrell is a 80 y.o. female and is here for a comprehensive physical exam. The patient reports problems - Since I last saw her she started having severe allergic reactions and ended up seeing the allergist..  Social History   Socioeconomic History  . Marital status: Divorced    Spouse name: Not on file  . Number of children: 3  . Years of education: 28  . Highest education level: Associate degree: academic program  Occupational History  . 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 Smoking status: Never Smoker  . Smokeless tobacco: Never Used  Vaping Use  . Vaping Use: Never used  Substance and Sexual Activity  . Alcohol use: Not Currently  . Drug use: No  . Sexual activity: Not Currently  Other Topics Concern  . 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 Drinks tea during the day.   Social Determinants of Health   Financial Resource Strain: Not on file  Food Insecurity: Not on file  Transportation Needs: Not on file  Physical Activity: Not on file  Stress: Not on file  Social Connections: Not on file  Intimate Partner Violence: Not on file   Health Maintenance  Topic Date Due  . COVID-19 Vaccine (1) Never done  . Hepatitis C Screening  Never done  . PNA vac Low Risk Adult (1 of 2 - PCV13) Never done  . INFLUENZA VACCINE  03/21/2024 (Originally 07/22/2021)  . TETANUS/TDAP  07/01/2029  . DEXA SCAN  Completed  . HPV VACCINES  Aged Out    The following portions of the patient's history were reviewed and updated as appropriate: allergies, current medications, past family history, past medical history, past social history, past surgical history and problem list.  Review of Systems A comprehensive review of systems was negative.   Objective:    BP (!) 113/44   Pulse (!) 55   Ht 5\' 3"  (1.6 m)   Wt 178 lb (80.7 kg)   SpO2 97%   BMI 31.53 kg/m   General appearance: alert, cooperative and appears stated age Head: Normocephalic, without obvious abnormality, atraumatic Eyes: conj clear, EOMI, PEERLA Ears: normal TM's and external ear canals both ears Nose: Nares normal. Septum midline. Mucosa normal. No drainage or sinus tenderness. Throat: lips, mucosa, and tongue normal; teeth and gums normal Neck: no adenopathy, no carotid bruit, no JVD, supple, symmetrical, trachea midline and thyroid not enlarged, symmetric, no tenderness/mass/nodules Back: symmetric, no curvature. ROM normal. No CVA tenderness. Lungs: clear to auscultation bilaterally Breasts: normal appearance, no masses or tenderness Heart: regular rate and rhythm, S1, S2 normal, no murmur, click, rub or gallop Abdomen: soft, non-tender; bowel sounds normal; no masses,  no organomegaly Extremities: extremities normal, atraumatic, no cyanosis or edema Pulses: 2+ and symmetric Skin: Skin color, texture, turgor normal. No rashes or lesions Lymph nodes: Cervical, supraclavicular, and axillary nodes normal. Neurologic: Grossly normal    Assessment:    Healthy female exam.      Plan:     See After Visit Summary for Counseling Recommendations   Keep up a regular exercise program and make sure you are eating a healthy diet Try to eat 4 servings of dairy a day, or if you are lactose intolerant take a calcium with vitamin D daily.  Your vaccines are up to date.

## 2021-04-26 NOTE — Patient Instructions (Signed)
Preventive Care 80 Years and Older, Female Preventive care refers to lifestyle choices and visits with your health care provider that can promote health and wellness. This includes:  A yearly physical exam. This is also called an annual wellness visit.  Regular dental and eye exams.  Immunizations.  Screening for certain conditions.  Healthy lifestyle choices, such as: ? Eating a healthy diet. ? Getting regular exercise. ? Not using drugs or products that contain nicotine and tobacco. ? Limiting alcohol use. What can I expect for my preventive care visit? Physical exam Your health care provider will check your:  Height and weight. These may be used to calculate your BMI (body mass index). BMI is a measurement that tells if you are at a healthy weight.  Heart rate and blood pressure.  Body temperature.  Skin for abnormal spots. Counseling Your health care provider may ask you questions about your:  Past medical problems.  Family's medical history.  Alcohol, tobacco, and drug use.  Emotional well-being.  Home life and relationship well-being.  Sexual activity.  Diet, exercise, and sleep habits.  History of falls.  Memory and ability to understand (cognition).  Work and work Statistician.  Pregnancy and menstrual history.  Access to firearms. What immunizations do I need? Vaccines are usually given at various ages, according to a schedule. Your health care provider will recommend vaccines for you based on your age, medical history, and lifestyle or other factors, such as travel or where you work.   What tests do I need? Blood tests  Lipid and cholesterol levels. These may be checked every 5 years, or more often depending on your overall health.  Hepatitis C test.  Hepatitis B test. Screening  Lung cancer screening. You may have this screening every year starting at age 74 if you have a 30-pack-year history of smoking and currently smoke or have quit within  the past 15 years.  Colorectal cancer screening. ? All adults should have this screening starting at age 44 and continuing until age 58. ? Your health care provider may recommend screening at age 2 if you are at increased risk. ? You will have tests every 1-10 years, depending on your results and the type of screening test.  Diabetes screening. ? This is done by checking your blood sugar (glucose) after you have not eaten for a while (fasting). ? You may have this done every 1-3 years.  Mammogram. ? This may be done every 1-2 years. ? Talk with your health care provider about how often you should have regular mammograms.  Abdominal aortic aneurysm (AAA) screening. You may need this if you are a current or former smoker.  BRCA-related cancer screening. This may be done if you have a family history of breast, ovarian, tubal, or peritoneal cancers. Other tests  STD (sexually transmitted disease) testing, if you are at risk.  Bone density scan. This is done to screen for osteoporosis. You may have this done starting at age 104. Talk with your health care provider about your test results, treatment options, and if necessary, the need for more tests. Follow these instructions at home: Eating and drinking  Eat a diet that includes fresh fruits and vegetables, whole grains, lean protein, and low-fat dairy products. Limit your intake of foods with high amounts of sugar, saturated fats, and salt.  Take vitamin and mineral supplements as recommended by your health care provider.  Do not drink alcohol if your health care provider tells you not to drink.  If you drink alcohol: ? Limit how much you have to 0-1 drink a day. ? Be aware of how much alcohol is in your drink. In the [place] one drink equals one 12 oz bottle of beer (355 mL), one 5 oz glass of wine (148 mL), or one 1 oz glass of hard liquor (44 mL).   Lifestyle  Take daily care of your teeth and gums. Brush your teeth every morning  and night with fluoride toothpaste. Floss one time each day.  Stay active. Exercise for at least 30 minutes 5 or more days each week.  Do not use any products that contain nicotine or tobacco, such as cigarettes, e-cigarettes, and chewing tobacco. If you need help quitting, ask your health care provider.  Do not use drugs.  If you are sexually active, practice safe sex. Use a condom or other form of protection in order to prevent STIs (sexually transmitted infections).  Talk with your health care provider about taking a low-dose aspirin or statin.  Find healthy ways to cope with stress, such as: ? Meditation, yoga, or listening to music. ? Journaling. ? Talking to a trusted person. ? Spending time with friends and family. Safety  Always wear your seat belt while driving or riding in a vehicle.  Do not drive: ? If you have been drinking alcohol. Do not ride with someone who has been drinking. ? When you are tired or distracted. ? While texting.  Wear a helmet and other protective equipment during sports activities.  If you have firearms in your house, make sure you follow all gun safety procedures. What's next?  Visit your health care provider once a year for an annual wellness visit.  Ask your health care provider how often you should have your eyes and teeth checked.  Stay up to date on all vaccines. This information is not intended to replace advice given to you by your health care provider. Make sure you discuss any questions you have with your health care provider. Document Revised: 11/28/2020 Document Reviewed: 12/02/2018 Elsevier Patient Education  2021 Elsevier Inc.  

## 2021-05-03 DIAGNOSIS — I10 Essential (primary) hypertension: Secondary | ICD-10-CM | POA: Diagnosis not present

## 2021-05-03 DIAGNOSIS — Z1159 Encounter for screening for other viral diseases: Secondary | ICD-10-CM | POA: Diagnosis not present

## 2021-05-06 LAB — COMPLETE METABOLIC PANEL WITH GFR
AG Ratio: 1.5 (calc) (ref 1.0–2.5)
ALT: 15 U/L (ref 6–29)
AST: 19 U/L (ref 10–35)
Albumin: 4.4 g/dL (ref 3.6–5.1)
Alkaline phosphatase (APISO): 80 U/L (ref 37–153)
BUN/Creatinine Ratio: 14 (calc) (ref 6–22)
BUN: 16 mg/dL (ref 7–25)
CO2: 28 mmol/L (ref 20–32)
Calcium: 9.7 mg/dL (ref 8.6–10.4)
Chloride: 105 mmol/L (ref 98–110)
Creat: 1.15 mg/dL — ABNORMAL HIGH (ref 0.60–0.93)
GFR, Est African American: 52 mL/min/{1.73_m2} — ABNORMAL LOW (ref >=60)
GFR, Est Non African American: 45 mL/min/{1.73_m2} — ABNORMAL LOW (ref >=60)
Globulin: 3 g/dL (calc) (ref 1.9–3.7)
Glucose, Bld: 89 mg/dL (ref 65–99)
Potassium: 5 mmol/L (ref 3.5–5.3)
Sodium: 141 mmol/L (ref 135–146)
Total Bilirubin: 0.4 mg/dL (ref 0.2–1.2)
Total Protein: 7.4 g/dL (ref 6.1–8.1)

## 2021-05-06 LAB — CBC
HCT: 41.7 % (ref 35.0–45.0)
Hemoglobin: 13.3 g/dL (ref 11.7–15.5)
MCH: 28.1 pg (ref 27.0–33.0)
MCHC: 31.9 g/dL — ABNORMAL LOW (ref 32.0–36.0)
MCV: 88 fL (ref 80.0–100.0)
MPV: 10.7 fL (ref 7.5–12.5)
Platelets: 304 10*3/uL (ref 140–400)
RBC: 4.74 10*6/uL (ref 3.80–5.10)
RDW: 13.1 % (ref 11.0–15.0)
WBC: 8.5 10*3/uL (ref 3.8–10.8)

## 2021-05-06 LAB — HEPATITIS C ANTIBODY
Hepatitis C Ab: NONREACTIVE
SIGNAL TO CUT-OFF: 0.01 (ref <1.00)

## 2021-05-06 LAB — LIPID PANEL W/REFLEX DIRECT LDL
Cholesterol: 163 mg/dL (ref <200)
HDL: 47 mg/dL — ABNORMAL LOW (ref >=50)
LDL Cholesterol (Calc): 83 mg/dL (calc)
Non-HDL Cholesterol (Calc): 116 mg/dL (calc) (ref <130)
Total CHOL/HDL Ratio: 3.5 (calc) (ref <5.0)
Triglycerides: 251 mg/dL — ABNORMAL HIGH (ref <150)

## 2021-05-06 LAB — TSH: TSH: 3.59 mIU/L (ref 0.40–4.50)

## 2021-05-08 ENCOUNTER — Ambulatory Visit (INDEPENDENT_AMBULATORY_CARE_PROVIDER_SITE_OTHER): Payer: Medicare HMO

## 2021-05-08 ENCOUNTER — Other Ambulatory Visit: Payer: Self-pay

## 2021-05-08 DIAGNOSIS — Z1231 Encounter for screening mammogram for malignant neoplasm of breast: Secondary | ICD-10-CM

## 2021-05-14 DIAGNOSIS — L209 Atopic dermatitis, unspecified: Secondary | ICD-10-CM | POA: Diagnosis not present

## 2021-05-15 ENCOUNTER — Other Ambulatory Visit: Payer: Self-pay | Admitting: Family Medicine

## 2021-06-19 ENCOUNTER — Other Ambulatory Visit: Payer: Self-pay | Admitting: Family Medicine

## 2021-06-19 DIAGNOSIS — D8989 Other specified disorders involving the immune mechanism, not elsewhere classified: Secondary | ICD-10-CM | POA: Diagnosis not present

## 2021-06-19 DIAGNOSIS — M159 Polyosteoarthritis, unspecified: Secondary | ICD-10-CM | POA: Diagnosis not present

## 2021-06-19 DIAGNOSIS — R768 Other specified abnormal immunological findings in serum: Secondary | ICD-10-CM | POA: Diagnosis not present

## 2021-06-19 DIAGNOSIS — R220 Localized swelling, mass and lump, head: Secondary | ICD-10-CM | POA: Diagnosis not present

## 2021-07-01 ENCOUNTER — Other Ambulatory Visit: Payer: Self-pay | Admitting: Family Medicine

## 2021-07-17 ENCOUNTER — Telehealth: Payer: Self-pay | Admitting: Family Medicine

## 2021-07-17 NOTE — Chronic Care Management (AMB) (Signed)
  Chronic Care Management   Note  07/17/2021 Name: Rebekah Harrell MRN: [id number] DOB: [birth year]  Rebekah Harrell is a 80 y.o. year old female who is a primary care patient of Linford Arnold, Barbarann Ehlers, MD. I reached out to [name] by phone today in response to a referral sent by Ms. Jenita Seashore PCP, Agapito Games, MD.   Ms. Basilio was given information about Chronic Care Management services today including:  CCM service includes personalized support from designated clinical staff supervised by her physician, including individualized plan of care and coordination with other care providers 24/7 contact phone numbers for assistance for urgent and routine care needs. Service will only be billed when office clinical staff spend 20 minutes or more in a month to coordinate care. Only one practitioner may furnish and bill the service in a calendar month. The patient may stop CCM services at any time (effective at the end of the month) by phone call to the office staff.   Patient agreed to services and verbal consent obtained.   Follow up plan:   Carmell Austria Upstream Scheduler

## 2021-07-19 ENCOUNTER — Telehealth: Payer: Self-pay

## 2021-07-19 NOTE — Telephone Encounter (Signed)
Pt called stating that she saw her rheumatologist yesterday and they did a urinalysis.  Pt states that the urinalysis showed leukocytes in her urine and that the rheumatologist thinks she may have a UTI and to contact her PCP for possible treatment.  Urine results are in Care Everywhere.  Please advise.  Tiajuana Amass, CMA

## 2021-07-22 NOTE — Telephone Encounter (Signed)
We can send her to lab for UA anc culture and gets account of her sxs or do nurse visitl

## 2021-07-22 NOTE — Telephone Encounter (Signed)
LVM for pt to call to discuss.  T. Valora Norell, CMA  

## 2021-07-23 NOTE — Telephone Encounter (Signed)
Pt informed.  Pt expressed understanding and is agreeable.  T. Darshawn Boateng, CMA  

## 2021-07-24 ENCOUNTER — Ambulatory Visit (INDEPENDENT_AMBULATORY_CARE_PROVIDER_SITE_OTHER): Payer: Medicare HMO | Admitting: Physician Assistant

## 2021-07-24 ENCOUNTER — Other Ambulatory Visit: Payer: Self-pay | Admitting: Physician Assistant

## 2021-07-24 VITALS — BP 155/74 | HR 64 | Temp 97.5°F | Ht 63.0 in | Wt 179.0 lb

## 2021-07-24 DIAGNOSIS — N390 Urinary tract infection, site not specified: Secondary | ICD-10-CM | POA: Diagnosis not present

## 2021-07-24 DIAGNOSIS — N898 Other specified noninflammatory disorders of vagina: Secondary | ICD-10-CM | POA: Diagnosis not present

## 2021-07-24 DIAGNOSIS — R82998 Other abnormal findings in urine: Secondary | ICD-10-CM

## 2021-07-24 DIAGNOSIS — R809 Proteinuria, unspecified: Secondary | ICD-10-CM

## 2021-07-24 LAB — POCT URINALYSIS DIP (CLINITEK)
Bilirubin, UA: NEGATIVE
Blood, UA: NEGATIVE
Glucose, UA: NEGATIVE mg/dL
Ketones, POC UA: NEGATIVE mg/dL
Nitrite, UA: NEGATIVE
POC PROTEIN,UA: 30 — AB
Spec Grav, UA: >=1.03 — AB (ref 1.010–1.025)
Urobilinogen, UA: 0.2 E.U./dL
pH, UA: 6 (ref 5.0–8.0)

## 2021-07-24 MED ORDER — NITROFURANTOIN MONOHYD MACRO 100 MG PO CAPS: 100.0000 mg | ORAL_CAPSULE | Freq: Two times a day (BID) | ORAL | 0 refills | Status: DC

## 2021-07-24 NOTE — Progress Notes (Signed)
   Subjective:    Patient ID: Rebekah Harrell, female    DOB: January 02, 1941, 80 y.o.   MRN: [id number]  HPI Patient reports having had blood work done at her rheumatologist that showed an elevated white count. They told her she may have a UTI and to see her PCP. She reports being asymptomatic other than a discharge that is unchanged. This has been a problem for a couple years now per patient. Patient reports no recent antibiotic use or recent catheterization. Patient has not taken any AZO. She denies any flank pain, pelvic pain, fever, chills, or sweats. She does have chronic low back pain.    Review of Systems     Objective:   Physical Exam    .. Results for orders placed or performed in visit on 07/24/21  POCT URINALYSIS DIP (CLINITEK)  Result Value Ref Range   Color, UA yellow yellow   Clarity, UA clear clear   Glucose, UA negative negative mg/dL   Bilirubin, UA negative negative   Ketones, POC UA negative negative mg/dL   Spec Grav, UA >=8.657 (A) 1.010 - 1.025   Blood, UA negative negative   pH, UA 6.0 5.0 - 8.0   POC PROTEIN,UA =30 (A) negative, trace   Urobilinogen, UA 0.2 0.2 or 1.0 E.U./dL   Nitrite, UA Negative Negative   Leukocytes, UA Small (1+) (A) Negative       Assessment & Plan:  Elevated white count, discharge, UA completed, urine culture pending. Per Lesly Rubenstein, start Macrobid 100mg  BID for 5 days. Also, a referral to Urology to assess for recurrent UTIs.

## 2021-07-25 DIAGNOSIS — N898 Other specified noninflammatory disorders of vagina: Secondary | ICD-10-CM | POA: Insufficient documentation

## 2021-07-25 DIAGNOSIS — R82998 Other abnormal findings in urine: Secondary | ICD-10-CM | POA: Insufficient documentation

## 2021-07-25 DIAGNOSIS — R809 Proteinuria, unspecified: Secondary | ICD-10-CM | POA: Insufficient documentation

## 2021-07-25 NOTE — Progress Notes (Signed)
UA positive for leuks and protein with elevated WBC. Sent macrobid for 5 days. Urine culture sent off. Follow up as needed or if symptoms worsen. Per patient is the recurrent. Urology referral placed.

## 2021-07-26 LAB — URINE CULTURE
MICRO NUMBER:: 12199164
SPECIMEN QUALITY:: ADEQUATE

## 2021-07-26 LAB — HOUSE ACCOUNT TRACKING

## 2021-07-26 NOTE — Progress Notes (Signed)
Stop antibiotic. No signs of abnormal bacteria in urine culture.

## 2021-08-06 DIAGNOSIS — L218 Other seborrheic dermatitis: Secondary | ICD-10-CM | POA: Diagnosis not present

## 2021-08-07 DIAGNOSIS — R8290 Unspecified abnormal findings in urine: Secondary | ICD-10-CM | POA: Diagnosis not present

## 2021-08-07 DIAGNOSIS — M329 Systemic lupus erythematosus, unspecified: Secondary | ICD-10-CM | POA: Diagnosis not present

## 2021-09-03 ENCOUNTER — Telehealth: Payer: Self-pay

## 2021-09-03 DIAGNOSIS — H524 Presbyopia: Secondary | ICD-10-CM | POA: Diagnosis not present

## 2021-09-03 DIAGNOSIS — H5213 Myopia, bilateral: Secondary | ICD-10-CM | POA: Diagnosis not present

## 2021-09-03 DIAGNOSIS — H52209 Unspecified astigmatism, unspecified eye: Secondary | ICD-10-CM | POA: Diagnosis not present

## 2021-09-03 NOTE — Progress Notes (Signed)
Chronic Care Management Pharmacy Assistant   Name: Rebekah Harrell  MRN: [id number] DOB: Dec 30, 1940  ENNIFER Harrell is an 80 y.o. year old female who presents for his initial CCM visit with the clinical pharmacist.   Recent office visits:  07/24/21 Rebekah Longs PA - Seen for Leukocytes in urine - Labs ordered - Started nitrofurantoin (macrocrystal-monohydrate) 100 mg - Referral to urology - No follow up noted  04/26/21 Rebekah Games MD - Seen for Screening mammogram - Labs ordered - No medication changes noted - No follow up noted    Recent consult visits:  08/07/21 Rebekah Sarah MD - [facility] Rheumatology and Arthritis - Seen for Systemic lupus erythematosus - No medication changes noted - No follow up noted  06/19/21 Rebekah Register MD - [facility] Rheumatology and Arthritis - Seen for ANA positive - Stopped meloxicam (MOBIC) 15 MG tablet  - No follow up noted  05/14/21 Rebekah Harrell - Dermatology - Seen for Atopic dermatitis - No notes available  05/08/21 Rebekah Harrell, Certified Registered Nurse Anesthetist - Seen for screening mammogram  - No notes available  03/05/21 Rebekah Harrell, Allergy and Immunology - Seen for Allergic contact dermatitis - No notes available     Hospital visits:  None in previous 6 months  Medications: Outpatient Encounter Medications as of 09/03/2021  Medication Sig   ALLERGY RELIEF 180 MG tablet TAKE 1 TABLET EVERY DAY   amLODipine (NORVASC) 10 MG tablet Take 1 tablet (10 mg total) by mouth daily.   budesonide-formoterol (SYMBICORT) 80-4.5 MCG/ACT inhaler Inhale 2 puffs into the lungs 2 (two) times daily.   CALCIUM PO Take 1 tablet by mouth at bedtime.   Cholecalciferol 125 MCG (5000 UT) capsule Take 5,000 Units by mouth daily.   diltiazem (CARDIZEM) 30 MG tablet Take 1 tablet every 4 hours AS NEEDED for heart rate >100 as long as top bp >100   famotidine (PEPCID) 20 MG tablet TAKE 1 TABLET TWICE DAILY   hydrALAZINE  (APRESOLINE) 50 MG tablet TAKE 1 TABLET(50 MG) BY MOUTH THREE TIMES DAILY   Magnesium 200 MG TABS Take 1 tablet (200 mg total) by mouth daily.   montelukast (SINGULAIR) 10 MG tablet TAKE 1 TABLET AT BEDTIME   Multiple Vitamins-Minerals (MULTIVITAMIN PO) Take 1 tablet by mouth at bedtime.   nitrofurantoin, macrocrystal-monohydrate, (MACROBID) 100 MG capsule Take 1 capsule (100 mg total) by mouth 2 (two) times daily.   omeprazole (PRILOSEC) 20 MG capsule TAKE 1 CAPSULE EVERY DAY   PATADAY 0.2 % SOLN Place 1 drop into both eyes daily as needed.   potassium chloride SA (KLOR-CON) 20 MEQ tablet TAKE 1 TABLET EVERY DAY   simvastatin (ZOCOR) 20 MG tablet TAKE 1 TABLET (20 MG TOTAL) BY MOUTH EVERY EVENING. MUST SCHEDULE YEARLY EXAM AND LABS FOR REFILLS   sotalol (BETAPACE) 80 MG tablet TAKE 1 TABLET (80 MG TOTAL) BY MOUTH 2 (TWO) TIMES DAILY. PLEASE KEEP UPCOMING APPOINTMENT FOR ANY FUTURE REFILLS   No facility-administered encounter medications on file as of 09/03/2021.    Care Gaps: COVID-19 Vaccine Zoster Vaccines- Shingrix  PNA vac Low Risk Adult    ALLERGY RELIEF 180 MG tablet- Last filled 09/27/18 90 DS  amLODipine (NORVASC) 10 MG tablet- Last filled 10/24/20 90 DS  budesonide-formoterol (SYMBICORT) 80-4.5 MCG/ACT inhaler- Last filled 06/15/202130 DS  CALCIUM PO- Last filled 11/27/16 DS unknown  Cholecalciferol 125 MCG (5000 UT) capsule- Last filled 08/27/17 DS unknown  diltiazem (CARDIZEM) 30 MG tablet-  Last filled 04/10/20 11 DS  famotidine (PEPCID) 20 MG tablet - Last filled 05/20/21 90 DS  hydrALAZINE (APRESOLINE) 50 MG tablet- Last filled 05/14/21 90 DS  Magnesium 200 MG TABS- Last filled 12/10/16 DS unknown  montelukast (SINGULAIR) 10 MG tablet- Last filled 07/04/21 90 DS  Multiple Vitamins-Minerals (MULTIVITAMIN PO) - Last filled 11/27/16 DS unknown  nitrofurantoin, macrocrystal-monohydrate, (MACROBID) 100 MG capsule- Last filled 07/24/21 5 DS omeprazole (PRILOSEC) 20 MG capsule- Last  filled 05/25/21 90 DS  PATADAY 0.2 % SOLN- Last filled 04/26/21 DS unknown  potassium chloride SA (KLOR-CON) 20 MEQ tablet- Last filled 06/20/21 90 DS  simvastatin (ZOCOR) 20 MG tablet- Last filled 07/02/21 90 DS sotalol (BETAPACE) 80 MG tablet- Last filled 05/25/21  90 DS   Star Rating Drugs: simvastatin (ZOCOR) 20 MG tablet- Last filled 07/02/21 90 DS   Salli Real, CMA

## 2021-09-05 ENCOUNTER — Other Ambulatory Visit: Payer: Self-pay

## 2021-09-05 ENCOUNTER — Ambulatory Visit (INDEPENDENT_AMBULATORY_CARE_PROVIDER_SITE_OTHER): Payer: Medicare HMO | Admitting: Pharmacist

## 2021-09-05 DIAGNOSIS — I10 Essential (primary) hypertension: Secondary | ICD-10-CM

## 2021-09-05 DIAGNOSIS — I480 Paroxysmal atrial fibrillation: Secondary | ICD-10-CM

## 2021-09-05 DIAGNOSIS — E7800 Pure hypercholesterolemia, unspecified: Secondary | ICD-10-CM

## 2021-09-05 NOTE — Patient Instructions (Signed)
Visit Information   PATIENT GOALS:   Goals Addressed             This Visit's Progress    Medication Management       Patient Goals/Self-Care Activities Over the next 180 days, patient will:  take medications as prescribed  Follow Up Plan: Telephone follow up appointment with care management team member scheduled for:  6 months         Consent to CCM Services: Ms. Hurless was given information about Chronic Care Management services including:  CCM service includes personalized support from designated clinical staff supervised by her physician, including individualized plan of care and coordination with other care providers 24/7 contact phone numbers for assistance for urgent and routine care needs. Service will only be billed when office clinical staff spend 20 minutes or more in a month to coordinate care. Only one practitioner may furnish and bill the service in a calendar month. The patient may stop CCM services at any time (effective at the end of the month) by phone call to the office staff. The patient will be responsible for cost sharing (co-pay) of up to 20% of the service fee (after annual deductible is met).  Patient agreed to services and verbal consent obtained.   Patient verbalizes understanding of instructions provided today and agrees to view in [employer].   Telephone follow up appointment with care management team member scheduled for: 6 months  CLINICAL CARE PLAN: Patient Care Plan: Medication Management     Problem Identified: HTN, HLD, Afib      Long-Range Goal: Disease Progression Prevention   Start Date: 09/05/2021  This Visit's Progress: On track  Priority: High  Note:   Current Barriers:  None at present  Pharmacist Clinical Goal(s):  Over the next 180 days, patient will adhere to plan to optimize therapeutic regimen for chronic conditions as evidenced by report of adherence to recommended medication management changes through collaboration with  PharmD and provider.   Interventions: 1:1 collaboration with Hali Marry, MD regarding development and update of comprehensive plan of care as evidenced by provider attestation and co-signature Inter-disciplinary care team collaboration (see longitudinal plan of care) Comprehensive medication review performed; medication list updated in electronic medical record  Hypertension:  Controlled; current treatment:amlodipine 35m daily, hydralazine 5104mBID ;   Current home readings: SBP 120-130s  Denies hypotensive/hypertensive symptoms  Recommended continue current regimen,  Hyperlipidemia:  Controlled; current treatment:simvastatin 2083maily; LDL 83, TG 251  Counseled on lifestyle modifications and dietary choices that can help lower triglycerides Recommended continue current regimen , and Atrial Fibrillation:  Controlled; current rate/rhythm control: sotalol 23m85mD, anticoagulant treatment: stopped eliquis due to affordability, prefers no anticoagulation otherwise  Counseled on available anticoagulation medicines and risk/benefit profiles Recommended continue current regimen  Patient Goals/Self-Care Activities Over the next 180 days, patient will:  take medications as prescribed  Follow Up Plan: Telephone follow up appointment with care management team member scheduled for:  6 months

## 2021-09-05 NOTE — Progress Notes (Signed)
Chronic Care Management Pharmacy Note  09/05/2021 Name:  Rebekah Harrell MRN:  [id number] DOB:  08-May-1941  Summary: addressed HTN, HLD, and Afib  Recommendations/Changes made from today's visit: none  Plan: f/u with pharmacist in 6 months  Subjective: Rebekah Harrell is an 80 y.o. year old female who is a primary patient of Metheney, Rene Kocher, MD.  The CCM team was consulted for assistance with disease management and care coordination needs.    Engaged with patient by telephone for initial visit in response to provider referral for pharmacy case management and/or care coordination services.   Consent to Services:  The patient was given information about Chronic Care Management services, agreed to services, and gave verbal consent prior to initiation of services.  Please see initial visit note for detailed documentation.   Patient Care Team: Hali Marry, MD as PCP - General (Family Medicine) Pablo Lawrence (Allergy and Immunology) Darius Bump, [facility] as Pharmacist (Pharmacist)  Recent office visits:  07/24/21 Donella Stade PA - Seen for Leukocytes in urine - Labs ordered - Started nitrofurantoin (macrocrystal-monohydrate) 100 mg - Referral to urology - No follow up noted  04/26/21 Hali Marry MD - Seen for Screening mammogram - Labs ordered - No medication changes noted - No follow up noted      Recent consult visits:  08/07/21 Valentino Nose MD - [facility] Rheumatology and Arthritis - Seen for Systemic lupus erythematosus - No medication changes noted - No follow up noted  06/19/21 Lance Coon MD - [facility] Rheumatology and Arthritis - Seen for ANA positive - Stopped meloxicam (MOBIC) 15 MG tablet  - No follow up noted  05/14/21 arlin Bullard Hollar - Dermatology - Seen for Atopic dermatitis - No notes available  05/08/21 Michele Rockers, Certified Registered Nurse Anesthetist - Seen for screening mammogram  - No notes available  03/05/21  Pablo Lawrence, Allergy and Immunology - Seen for Allergic contact dermatitis - No notes available        Hospital visits:  None in previous 6 months  Objective:  Lab Results  Component Value Date   CREATININE 1.15 (H) 05/03/2021   CREATININE 1.16 (H) 04/11/2020   CREATININE 0.97 (H) 03/09/2019    Lab Results  Component Value Date   HGBA1C 5.8 (H) 06/19/2016   Last diabetic Eye exam: No results found for: HMDIABEYEEXA  Last diabetic Foot exam: No results found for: HMDIABFOOTEX      Component Value Date/Time   CHOL 163 05/03/2021 1231   CHOL 148 11/18/2017 1205   TRIG 251 (H) 05/03/2021 1231   HDL 47 (L) 05/03/2021 1231   HDL 38 (L) 11/18/2017 1205   CHOLHDL 3.5 05/03/2021 1231   VLDL 26 06/19/2016 1136   LDLCALC 83 05/03/2021 1231    Hepatic Function Latest Ref Rng & Units 05/03/2021 04/11/2020 03/09/2019  Total Protein 6.1 - 8.1 g/dL 7.4 7.3 7.0  Albumin 3.5 - 4.8 g/dL - - -  AST 10 - 35 U/L _0 ALT 6 - 29 U/L _1 Alk Phosphatase 39 - 117 IU/L - - -  Total Bilirubin 0.2 - 1.2 mg/dL 0.4 0.6 0.5  Bilirubin, Direct 0.00 - 0.40 mg/dL - - -    Lab Results  Component Value Date/Time   TSH 3.59 05/03/2021 12:31 PM   TSH 3.33 04/11/2020 10:24 AM    CBC Latest Ref Rng & Units 05/03/2021 04/11/2020 03/09/2019  WBC 3.8 - 10.8 Thousand/uL 8.5  9.3 9.0  Hemoglobin 11.7 - 15.5 g/dL 13.3 14.0 13.6  Hematocrit 35.0 - 45.0 % 41.7 41.7 40.6  Platelets 140 - 400 Thousand/uL 304 282 256    Lab Results  Component Value Date/Time   VD25OH 27 (L) 04/03/2010 07:58 PM    Clinical ASCVD: Yes  The 10-year ASCVD risk score (Arnett DK, et al., 2019) is: 28.3%   Values used to calculate the score:     Age: 89 years     Sex: Female     Is Non-Hispanic African American: No     Diabetic: No     Tobacco smoker: No     Systolic Blood Pressure: 938 mmHg     Is BP treated: Yes     HDL Cholesterol: 47 mg/dL     Total Cholesterol: 163 mg/dL    Other: (CHADS2VASc if  Afib, PHQ9 if depression, MMRC or CAT for COPD, ACT, DEXA)  Social History   Tobacco Use  Smoking Status Never  Smokeless Tobacco Never   BP Readings from Last 3 Encounters:  07/24/21 (!) 155/74  04/26/21 (!) 113/44  07/25/20 124/60   Pulse Readings from Last 3 Encounters:  07/24/21 64  04/26/21 (!) 55  07/25/20 (!) 54   Wt Readings from Last 3 Encounters:  07/24/21 179 lb (81.2 kg)  04/26/21 178 lb (80.7 kg)  07/25/20 174 lb 1.9 oz (79 kg)    Assessment: Review of patient past medical history, allergies, medications, health status, including review of consultants reports, laboratory and other test data, was performed as part of comprehensive evaluation and provision of chronic care management services.   SDOH:  (Social Determinants of Health) assessments and interventions performed:    CCM Care Plan  Allergies  Allergen Reactions   Ace Inhibitors Swelling   Tramadol Other (See Comments)    Night sweats, bad dreams   Celecoxib Other (See Comments)    REACTION: hair Harrell   Cetirizine Hcl Other (See Comments)    REACTION: fatigue   Penicillins Other (See Comments)    Unknown   Sulfonamide Derivatives Rash    Medications Reviewed Today     Reviewed by Darius Bump, [facility] (Pharmacist) on 09/05/21 at 1113  Med List Status: <None>   Medication Order Taking? Sig Documenting Provider Last Dose Status Informant  ALLERGY RELIEF 180 MG tablet [id number] Yes TAKE 1 TABLET EVERY DAY Hali Marry, MD Taking Active   amLODipine (NORVASC) 10 MG tablet [id number] Yes Take 1 tablet (10 mg total) by mouth daily. Lelon Perla, MD Taking Active   budesonide-formoterol [facility]) 80-4.5 MCG/ACT inhaler [id number] No Inhale 2 puffs into the lungs 2 (two) times daily.  Patient not taking: Reported on 09/05/2021   Hali Marry, MD Not Taking Active   CALCIUM PO [id number] Yes Take 1 tablet by mouth at bedtime. [provider] Taking Active Self   Cholecalciferol 125 MCG (5000 UT) capsule [id number] Yes Take 5,000 Units by mouth daily. [provider] Taking Active   diltiazem (CARDIZEM) 30 MG tablet [id number] No Take 1 tablet every 4 hours AS NEEDED for heart rate >100 as long as top bp >100  Patient not taking: Reported on 09/05/2021   Sherran Needs, NP Not Taking Active   famotidine (PEPCID) 20 MG tablet [id number] Yes TAKE 1 TABLET TWICE DAILY Hali Marry, MD Taking Active   hydrALAZINE (APRESOLINE) 50 MG tablet [id number] Yes TAKE 1 TABLET(50 MG) BY MOUTH THREE TIMES DAILY Stanford Breed, Denice Bors, MD Taking  Active            Med Note Tonny Bollman Sep 05, 2021 11:10 AM) Taking BID  Magnesium 200 MG TABS [id number] Yes Take 1 tablet (200 mg total) by mouth daily. Sherran Needs, NP Taking Active   montelukast (SINGULAIR) 10 MG tablet [id number] Yes TAKE 1 TABLET AT BEDTIME Hali Marry, MD Taking Active   Multiple Vitamins-Minerals (MULTIVITAMIN PO) [id number] Yes Take 1 tablet by mouth at bedtime. [provider] Taking Active Self  nitrofurantoin, macrocrystal-monohydrate, (MACROBID) 100 MG capsule [id number] No Take 1 capsule (100 mg total) by mouth 2 (two) times daily.  Patient not taking: Reported on 09/05/2021   Donella Stade, PA-C Not Taking Active   omeprazole (PRILOSEC) 20 MG capsule [id number] Yes TAKE 1 CAPSULE EVERY DAY Hali Marry, MD Taking Active   PATADAY 0.2 % SOLN [id number] No Place 1 drop into both eyes daily as needed.  Patient not taking: Reported on 09/05/2021   Pablo Lawrence Not Taking Active   potassium chloride SA (KLOR-CON) 20 MEQ tablet [id number] Yes TAKE 1 TABLET EVERY DAY Hali Marry, MD Taking Active   simvastatin (ZOCOR) 20 MG tablet [id number] Yes TAKE 1 TABLET (20 MG TOTAL) BY MOUTH EVERY EVENING. MUST SCHEDULE YEARLY EXAM AND LABS FOR REFILLS Hali Marry, MD Taking Active   sotalol (BETAPACE) 80 MG tablet [id number] Yes TAKE 1  TABLET (80 MG TOTAL) BY MOUTH 2 (TWO) TIMES DAILY. PLEASE KEEP UPCOMING APPOINTMENT FOR ANY FUTURE REFILLS Lelon Perla, MD Taking Active   Zinc 50 MG TABS [id number] Yes Take 1 tablet by mouth daily. [provider] Taking Active             Patient Active Problem List   Diagnosis Date Noted   Proteinuria 07/25/2021   Leukocytes in urine 07/25/2021   Vaginal discharge 07/25/2021   Uncontrolled stage 2 hypertension 07/31/2017   Angioedema due to angiotensin converting enzyme inhibitor (ACE-I) 07/31/2017   Urticaria, chronic 07/31/2017   Atrial fibrillation ([place]) 11/26/2016   Aortic atherosclerosis ([place]) 07/07/2016   CKD (chronic kidney disease) stage 3, GFR 30-59 ml/min (HCC) 10/29/2015   Hypoglycemia 06/09/2014   Osteoarthritis of left thumb 06/07/2014   Osteoarthritis of right thumb 06/07/2014   Arrhythmia 04/30/2011   Spondylolisthesis at L4-L5 level 04/03/2010   OSTEOPENIA 04/03/2010   SEBORRHEIC KERATOSIS 02/18/2008   CAROTID ARTERY STENOSIS, BILATERAL 02/15/2008   COPD (chronic obstructive pulmonary disease) ([place]) 08/03/2007   IBS 08/03/2007   Hyperlipidemia 07/05/2007   HYPERTENSION, BENIGN ESSENTIAL 07/05/2007    Immunization History  Administered Date(s) Administered   Td 05/24/2001   Tdap 07/02/2019    Conditions to be addressed/monitored: Atrial Fibrillation, HTN, and HLD  Care Plan : Medication Management  Updates made by Darius Bump, [place] since 09/05/2021 12:00 AM     Problem: HTN, HLD, Afib      Long-Range Goal: Disease Progression Prevention   Start Date: 09/05/2021  This Visit's Progress: On track  Priority: High  Note:   Current Barriers:  None at present  Pharmacist Clinical Goal(s):  Over the next 180 days, patient will adhere to plan to optimize therapeutic regimen for chronic conditions as evidenced by report of adherence to recommended medication management changes through collaboration with PharmD and provider.    Interventions: 1:1 collaboration with Hali Marry, MD regarding development and update of comprehensive plan of care as evidenced by provider attestation and co-signature Inter-disciplinary care team collaboration (see  longitudinal plan of care) Comprehensive medication review performed; medication list updated in electronic medical record  Hypertension:  Controlled; current treatment:amlodipine 90m daily, hydralazine 510mBID ;   Current home readings: SBP 120-130s  Denies hypotensive/hypertensive symptoms  Recommended continue current regimen,  Hyperlipidemia:  Controlled; current treatment:simvastatin 2044maily; LDL 83, TG 251  Counseled on lifestyle modifications and dietary choices that can help lower triglycerides Recommended continue current regimen , and Atrial Fibrillation:  Controlled; current rate/rhythm control: sotalol 45m32mD, anticoagulant treatment: stopped eliquis due to affordability, prefers no anticoagulation otherwise  Counseled on available anticoagulation medicines and risk/benefit profiles Recommended continue current regimen  Patient Goals/Self-Care Activities Over the next 180 days, patient will:  take medications as prescribed  Follow Up Plan: Telephone follow up appointment with care management team member scheduled for:  6 months      Medication Assistance: None required.  Patient affirms current coverage meets needs.  [id number]: [phone]: [phone] [id number]: [phone]: [phone] pill box? Yes Pt endorses 100% compliance  Follow Up:  Patient agrees to Care Plan and Follow-up.  Plan: Telephone  follow up appointment with care management team member scheduled for:  6 months  KeesDarius Bump

## 2021-09-12 DIAGNOSIS — R8279 Other abnormal findings on microbiological examination of urine: Secondary | ICD-10-CM | POA: Diagnosis not present

## 2021-09-20 DIAGNOSIS — I10 Essential (primary) hypertension: Secondary | ICD-10-CM

## 2021-09-20 DIAGNOSIS — E7800 Pure hypercholesterolemia, unspecified: Secondary | ICD-10-CM

## 2021-09-20 DIAGNOSIS — I480 Paroxysmal atrial fibrillation: Secondary | ICD-10-CM | POA: Diagnosis not present

## 2021-09-25 ENCOUNTER — Other Ambulatory Visit: Payer: Self-pay | Admitting: Cardiology

## 2021-09-25 DIAGNOSIS — I10 Essential (primary) hypertension: Secondary | ICD-10-CM

## 2021-09-25 NOTE — Telephone Encounter (Signed)
Filled 09/25/21

## 2021-10-02 ENCOUNTER — Telehealth: Payer: Self-pay | Admitting: Family Medicine

## 2021-10-02 NOTE — Telephone Encounter (Signed)
Please see MyChart note sent to patient.   @[email or web address]@     

## 2021-10-29 DIAGNOSIS — R768 Other specified abnormal immunological findings in serum: Secondary | ICD-10-CM | POA: Diagnosis not present

## 2021-10-29 DIAGNOSIS — M329 Systemic lupus erythematosus, unspecified: Secondary | ICD-10-CM | POA: Diagnosis not present

## 2021-11-11 ENCOUNTER — Other Ambulatory Visit: Payer: Self-pay | Admitting: Family Medicine

## 2021-11-20 ENCOUNTER — Other Ambulatory Visit: Payer: Self-pay | Admitting: Family Medicine

## 2021-11-26 DIAGNOSIS — L508 Other urticaria: Secondary | ICD-10-CM | POA: Diagnosis not present

## 2021-11-28 DIAGNOSIS — L239 Allergic contact dermatitis, unspecified cause: Secondary | ICD-10-CM | POA: Diagnosis not present

## 2021-12-02 DIAGNOSIS — L239 Allergic contact dermatitis, unspecified cause: Secondary | ICD-10-CM | POA: Diagnosis not present

## 2021-12-02 DIAGNOSIS — H1013 Acute atopic conjunctivitis, bilateral: Secondary | ICD-10-CM | POA: Diagnosis not present

## 2021-12-02 DIAGNOSIS — J3081 Allergic rhinitis due to animal (cat) (dog) hair and dander: Secondary | ICD-10-CM | POA: Diagnosis not present

## 2021-12-02 DIAGNOSIS — J3089 Other allergic rhinitis: Secondary | ICD-10-CM | POA: Diagnosis not present

## 2021-12-02 DIAGNOSIS — J301 Allergic rhinitis due to pollen: Secondary | ICD-10-CM | POA: Diagnosis not present

## 2021-12-03 ENCOUNTER — Other Ambulatory Visit: Payer: Self-pay | Admitting: Cardiology

## 2021-12-03 DIAGNOSIS — I10 Essential (primary) hypertension: Secondary | ICD-10-CM

## 2021-12-06 ENCOUNTER — Other Ambulatory Visit: Payer: Self-pay | Admitting: *Deleted

## 2021-12-06 DIAGNOSIS — I10 Essential (primary) hypertension: Secondary | ICD-10-CM

## 2021-12-06 MED ORDER — HYDRALAZINE HCL 50 MG PO TABS: ORAL_TABLET | ORAL | 3 refills | Status: DC

## 2021-12-09 ENCOUNTER — Other Ambulatory Visit: Payer: Self-pay

## 2021-12-09 DIAGNOSIS — R5383 Other fatigue: Secondary | ICD-10-CM | POA: Diagnosis not present

## 2021-12-09 DIAGNOSIS — R0602 Shortness of breath: Secondary | ICD-10-CM | POA: Diagnosis not present

## 2021-12-09 DIAGNOSIS — M818 Other osteoporosis without current pathological fracture: Secondary | ICD-10-CM | POA: Diagnosis not present

## 2021-12-09 DIAGNOSIS — M329 Systemic lupus erythematosus, unspecified: Secondary | ICD-10-CM | POA: Diagnosis not present

## 2021-12-09 DIAGNOSIS — I10 Essential (primary) hypertension: Secondary | ICD-10-CM

## 2021-12-09 DIAGNOSIS — M159 Polyosteoarthritis, unspecified: Secondary | ICD-10-CM | POA: Diagnosis not present

## 2021-12-09 MED ORDER — HYDRALAZINE HCL 50 MG PO TABS: ORAL_TABLET | ORAL | 3 refills | Status: DC

## 2021-12-22 NOTE — Progress Notes (Signed)
HPI:FU atrial fibrillation.  Patient with new diagnosis of atrial fibrillation in June 2017. Stress echocardiogram 2011 showed poor exercise tolerance but no ischemia. Admitted December 2017 with atrial fibrillation with rapid ventricular response and acute diastolic congestive heart failure. Patient was placed on sotalol and converted to sinus rhythm. Sotalol dose was decreased to 80 mg twice a day because of prolonged QT interval. Metoprolol was discontinued because of bradycardia. Developed angioedema with lisinopril.  Echocardiogram April 2021 showed normal LV function, moderate left atrial enlargement, mild to moderate mitral regurgitation. Since last seen, she has dyspnea on exertion but no orthopnea, PND, pedal edema, chest pain, palpitations or syncope.  Current Outpatient Medications  Medication Sig Dispense Refill   ALLERGY RELIEF 180 MG tablet TAKE 1 TABLET EVERY DAY 90 tablet 1   amLODipine (NORVASC) 10 MG tablet TAKE 1 TABLET EVERY DAY (MUST KEEP SCHEDULED APPT) 90 tablet 0   budesonide-formoterol (SYMBICORT) 80-4.5 MCG/ACT inhaler Inhale 2 puffs into the lungs 2 (two) times daily. 1 Inhaler 3   CALCIUM PO Take 1 tablet by mouth at bedtime.     Cholecalciferol 125 MCG (5000 UT) capsule Take 5,000 Units by mouth daily.     diltiazem (CARDIZEM) 30 MG tablet Take 1 tablet every 4 hours AS NEEDED for heart rate >100 as long as top bp >100 45 tablet 1   famotidine (PEPCID) 20 MG tablet TAKE 1 TABLET TWICE DAILY 180 tablet 1   hydrALAZINE (APRESOLINE) 50 MG tablet TAKE 1 TABLET(50 MG) BY MOUTH THREE TIMES DAILY 270 tablet 3   hydroxychloroquine (PLAQUENIL) 200 MG tablet Take 200 mg by mouth daily.     Magnesium 200 MG TABS Take 1 tablet (200 mg total) by mouth daily. 60 each    montelukast (SINGULAIR) 10 MG tablet TAKE 1 TABLET AT BEDTIME 90 tablet 1   Multiple Vitamins-Minerals (MULTIVITAMIN PO) Take 1 tablet by mouth at bedtime.     nitrofurantoin, macrocrystal-monohydrate,  (MACROBID) 100 MG capsule Take 1 capsule (100 mg total) by mouth 2 (two) times daily. 10 capsule 0   omeprazole (PRILOSEC) 20 MG capsule TAKE 1 CAPSULE EVERY DAY 90 capsule 3   PATADAY 0.2 % SOLN Place 1 drop into both eyes daily as needed.     potassium chloride SA (KLOR-CON) 20 MEQ tablet TAKE 1 TABLET EVERY DAY 90 tablet 0   simvastatin (ZOCOR) 20 MG tablet TAKE 1 TABLET (20 MG TOTAL) BY MOUTH EVERY EVENING. MUST SCHEDULE YEARLY EXAM AND LABS FOR REFILLS 90 tablet 3   sotalol (BETAPACE) 80 MG tablet TAKE 1 TABLET (80 MG TOTAL) BY MOUTH 2 (TWO) TIMES DAILY. PLEASE KEEP UPCOMING APPOINTMENT FOR ANY FUTURE REFILLS 180 tablet 60   Zinc 50 MG TABS Take 1 tablet by mouth daily.     No current facility-administered medications for this visit.     Past Medical History:  Diagnosis Date   Aortic atherosclerosis ([place])    Arthritis    "lower back; knees" (11/26/2016)   Asthma    Chronic atrial fibrillation ([facility])    CKD (chronic kidney disease), stage III ([place])    patient unaware of this on [id number]   Complication of anesthesia    "had trouble waking me up in the late 1990's after gallbladder OR"   GERD (gastroesophageal reflux disease)    Heart murmur    "dx'd when I was a little kid"   History of hiatal hernia    Hyperlipidemia    Hypertension    Hypoglycemia  Past Surgical History:  Procedure Laterality Date   CHOLECYSTECTOMY OPEN  1990s   TONSILLECTOMY     TUBAL LIGATION      Social History   Socioeconomic History   Marital status: Divorced    Spouse name: Not on file   Number of children: 3   Years of education: 13   Highest education level: Associate degree: academic program  Occupational History   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 Drinks tea during the day.   Social Determinants of Health   Financial Resource Strain: Not on file  Food Insecurity: Not on file  Transportation Needs: Not on file  Physical Activity: Not on file  Stress: Not on file  Social Connections: Not on file  Intimate Partner Violence: Not on file    Family History  Problem Relation Age of Onset   CAD Mother        HEART ATTACK   Colon cancer Father        KIDNEY FAILURE/COLON CANCER   Atrial fibrillation Brother    Heart failure Maternal Grandmother    Kidney failure Maternal Grandfather    Heart attack Paternal Grandmother    Healthy Brother     ROS: no fevers or chills, productive cough, hemoptysis, dysphasia, odynophagia, melena, hematochezia, dysuria, hematuria, rash, seizure activity, orthopnea, PND, pedal edema, claudication. Remaining systems are negative.  Physical Exam: Well-developed well-nourished in no acute distress.  Skin is warm and dry.  HEENT is normal.  Neck is supple.  Chest is clear to auscultation with normal expansion.  Cardiovascular exam is regular rate and rhythm.  Abdominal exam nontender or distended. No masses palpated. Extremities show no edema. neuro grossly intact  ECG-sinus bradycardia at a rate of 51, normal axis, no ST changes.  8Personally reviewed  A/P  1 paroxysmal atrial fibrillation-patient is in sinus rhythm today.  We will continue sotalol.  She discontinued apixaban due to risk of bleeding and expense.  She will consider resuming it.  It is affordable.  She will contact us if she wants to resume.  She understands the higher risk of CVA off of anticoagulation.  2 hypertension-blood pressure controlled.  Continue present medications.  3 hyperlipidemia-continue statin.    4 chronic diastolic congestive heart failure-she appears to be  euvolemic on examination today.  We will continue Lasix as needed.  5 mild to moderate mitral regurgitation-repeat echocardiogram.  Kirk Ruths, MD

## 2021-12-26 ENCOUNTER — Other Ambulatory Visit: Payer: Self-pay | Admitting: Cardiology

## 2022-01-01 ENCOUNTER — Other Ambulatory Visit: Payer: Self-pay

## 2022-01-01 ENCOUNTER — Encounter: Payer: Self-pay | Admitting: Cardiology

## 2022-01-01 ENCOUNTER — Ambulatory Visit (INDEPENDENT_AMBULATORY_CARE_PROVIDER_SITE_OTHER): Payer: Medicare HMO | Admitting: Cardiology

## 2022-01-01 VITALS — BP 120/60 | HR 51 | Ht 63.0 in | Wt 174.4 lb

## 2022-01-01 DIAGNOSIS — I10 Essential (primary) hypertension: Secondary | ICD-10-CM | POA: Diagnosis not present

## 2022-01-01 DIAGNOSIS — I059 Rheumatic mitral valve disease, unspecified: Secondary | ICD-10-CM | POA: Diagnosis not present

## 2022-01-01 DIAGNOSIS — I480 Paroxysmal atrial fibrillation: Secondary | ICD-10-CM

## 2022-01-01 DIAGNOSIS — I5032 Chronic diastolic (congestive) heart failure: Secondary | ICD-10-CM | POA: Diagnosis not present

## 2022-01-01 DIAGNOSIS — E7800 Pure hypercholesterolemia, unspecified: Secondary | ICD-10-CM | POA: Diagnosis not present

## 2022-01-01 NOTE — Patient Instructions (Signed)
°  Testing/Procedures:  Your physician has requested that you have an echocardiogram. Echocardiography is a painless test that uses sound waves to create images of your heart. It provides your doctor with information about the size and shape of your heart and how well your hearts chambers and valves are working. This procedure takes approximately one hour. There are no restrictions for this procedure. [facility]-1ST FLOOR IMAGING DEPARTMENT   Follow-Up: At [facility], you and your health needs are our priority.  As part of our continuing mission to provide you with exceptional heart care, we have created designated Provider Care Teams.  These Care Teams include your primary Cardiologist (physician) and Advanced Practice Providers (APPs -  Physician Assistants and Nurse Practitioners) who all work together to provide you with the care you need, when you need it.  We recommend signing up for the patient portal called "MyChart".  Sign up information is provided on this After Visit Summary.  MyChart is used to connect with patients for Virtual Visits (Telemedicine).  Patients are able to view lab/test results, encounter notes, upcoming appointments, etc.  Non-urgent messages can be sent to your provider as well.   To learn more about what you can do with MyChart, go to [email or web address].    Your next appointment:   12 month(s)  The format for your next appointment:   In Person  Provider:   Olga Millers MD

## 2022-01-08 ENCOUNTER — Other Ambulatory Visit: Payer: Self-pay | Admitting: Cardiology

## 2022-01-08 DIAGNOSIS — I10 Essential (primary) hypertension: Secondary | ICD-10-CM

## 2022-01-15 ENCOUNTER — Ambulatory Visit (INDEPENDENT_AMBULATORY_CARE_PROVIDER_SITE_OTHER): Payer: Medicare HMO | Admitting: Family Medicine

## 2022-01-15 DIAGNOSIS — Z0000 Encounter for general adult medical examination without abnormal findings: Secondary | ICD-10-CM

## 2022-01-15 NOTE — Patient Instructions (Signed)
[employer] Maintenance Summary and Written Plan of Care  [name] ,  Thank you for allowing me to perform your Medicare Annual Wellness Visit and for your ongoing commitment to your health.   Health Maintenance & Immunization History Health Maintenance  Topic Date Due   COVID-19 Vaccine (1) 01/31/2022 (Originally 04/20/1942)   Zoster Vaccines- Shingrix (1 of 2) 04/15/2022 (Originally 10/20/1960)   Pneumonia Vaccine 41+ Years old (1 - PCV) 01/15/2023 (Originally 10/21/1947)   INFLUENZA VACCINE  03/21/2024 (Originally 07/22/2021)   TETANUS/TDAP  07/01/2029   DEXA SCAN  Completed   HPV VACCINES  Aged Out   Immunization History  Administered Date(s) Administered   Td 05/24/2001   Tdap 07/02/2019    These are the patient goals that we discussed:  Goals Addressed               This Visit's Progress     Patient Stated (pt-stated)        Would like to be generally healthier.         This is a list of Health Maintenance Items that are overdue or due now: Pneumococcal vaccine  Influenza vaccine Bone densitometry screening Shingrix She is scheduled for her bone density in February, 2023.   Orders/Referrals Placed Today: No orders of the defined types were placed in this encounter.  (Contact our referral department at [phone] if you have not spoken with someone about your referral appointment within the next 5 days)    Follow-up Plan Follow-up with Hali Marry, MD as planned Please have your bone density results faxed to our office. Medicare wellness visit in one year. AVS printed and mailed to the patient.      Health Maintenance, Female Adopting a healthy lifestyle and getting preventive care are important in promoting health and wellness. Ask your health care provider about: The right schedule for you to have regular tests and exams. Things you can do on your own to prevent diseases and keep yourself healthy. What  should I know about diet, weight, and exercise? Eat a healthy diet  Eat a diet that includes plenty of vegetables, fruits, low-fat dairy products, and lean protein. Do not eat a lot of foods that are high in solid fats, added sugars, or sodium. Maintain a healthy weight Body mass index (BMI) is used to identify weight problems. It estimates body fat based on height and weight. Your health care provider can help determine your BMI and help you achieve or maintain a healthy weight. Get regular exercise Get regular exercise. This is one of the most important things you can do for your health. Most adults should: Exercise for at least 150 minutes each week. The exercise should increase your heart rate and make you sweat (moderate-intensity exercise). Do strengthening exercises at least twice a week. This is in addition to the moderate-intensity exercise. Spend less time sitting. Even light physical activity can be beneficial. Watch cholesterol and blood lipids Have your blood tested for lipids and cholesterol at 81 years of age, then have this test every 5 years. Have your cholesterol levels checked more often if: Your lipid or cholesterol levels are high. You are older than 81 years of age. You are at high risk for heart disease. What should I know about cancer screening? Depending on your health history and family history, you may need to have cancer screening at various ages. This may include screening for: Breast cancer. Cervical cancer. Colorectal cancer. Skin cancer. Lung cancer.  What should I know about heart disease, diabetes, and high blood pressure? Blood pressure and heart disease High blood pressure causes heart disease and increases the risk of stroke. This is more likely to develop in people who have high blood pressure readings or are overweight. Have your blood pressure checked: Every 3-5 years if you are 78-21 years of age. Every year if you are 9 years old or  older. Diabetes Have regular diabetes screenings. This checks your fasting blood sugar level. Have the screening done: Once every three years after age 51 if you are at a normal weight and have a low risk for diabetes. More often and at a younger age if you are overweight or have a high risk for diabetes. What should I know about preventing infection? Hepatitis B If you have a higher risk for hepatitis B, you should be screened for this virus. Talk with your health care provider to find out if you are at risk for hepatitis B infection. Hepatitis C Testing is recommended for: Everyone born from [birth year] through 1965. Anyone with known risk factors for hepatitis C. Sexually transmitted infections (STIs) Get screened for STIs, including gonorrhea and chlamydia, if: You are sexually active and are younger than 81 years of age. You are older than 81 years of age and your health care provider tells you that you are at risk for this type of infection. Your sexual activity has changed since you were last screened, and you are at increased risk for chlamydia or gonorrhea. Ask your health care provider if you are at risk. Ask your health care provider about whether you are at high risk for HIV. Your health care provider may recommend a prescription medicine to help prevent HIV infection. If you choose to take medicine to prevent HIV, you should first get tested for HIV. You should then be tested every 3 months for as long as you are taking the medicine. Pregnancy If you are about to stop having your period (premenopausal) and you may become pregnant, seek counseling before you get pregnant. Take 400 to 800 micrograms (mcg) of folic acid every day if you become pregnant. Ask for birth control (contraception) if you want to prevent pregnancy. Osteoporosis and menopause Osteoporosis is a disease in which the bones lose minerals and strength with aging. This can result in bone fractures. If you are 73 years old  or older, or if you are at risk for osteoporosis and fractures, ask your health care provider if you should: Be screened for bone loss. Take a calcium or vitamin D supplement to lower your risk of fractures. Be given hormone replacement therapy (HRT) to treat symptoms of menopause. Follow these instructions at home: Alcohol use Do not drink alcohol if: Your health care provider tells you not to drink. You are pregnant, may be pregnant, or are planning to become pregnant. If you drink alcohol: Limit how much you have to: 0-1 drink a day. Know how much alcohol is in your drink. In the [place] one drink equals one 12 oz bottle of beer (355 mL), one 5 oz glass of wine (148 mL), or one 1 oz glass of hard liquor (44 mL). Lifestyle Do not use any products that contain nicotine or tobacco. These products include cigarettes, chewing tobacco, and vaping devices, such as e-cigarettes. If you need help quitting, ask your health care provider. Do not use street drugs. Do not share needles. Ask your health care provider for help if you need support or information  about quitting drugs. General instructions Schedule regular health, dental, and eye exams. Stay current with your vaccines. Tell your health care provider if: You often feel depressed. You have ever been abused or do not feel safe at home. Summary Adopting a healthy lifestyle and getting preventive care are important in promoting health and wellness. Follow your health care provider's instructions about healthy diet, exercising, and getting tested or screened for diseases. Follow your health care provider's instructions on monitoring your cholesterol and blood pressure. This information is not intended to replace advice given to you by your health care provider. Make sure you discuss any questions you have with your health care provider. Document Revised: 04/29/2021 Document Reviewed: 04/29/2021 Elsevier Patient Education  [place].

## 2022-01-15 NOTE — Progress Notes (Signed)
MEDICARE ANNUAL WELLNESS VISIT  01/15/2022  Telephone Visit Disclaimer This Medicare AWV was conducted by telephone due to national recommendations for restrictions regarding the COVID-19 Pandemic (e.g. social distancing).  I verified, using two identifiers, that I am speaking with Rebekah Harrell or their authorized healthcare agent. I discussed the limitations, risks, security, and privacy concerns of performing an evaluation and management service by telephone and the potential availability of an in-person appointment in the future. The patient expressed understanding and agreed to proceed.  Location of Patient: Home Location of Provider (nurse):  In the office.  Subjective:    Rebekah Harrell is a 81 y.o. female patient of Metheney, Rene Kocher, MD who had a Medicare Annual Wellness Visit today via telephone. Rebekah Harrell is Retired and lives alone. she has 3 children. she reports that she is socially active and does interact with friends/family regularly. she is minimally physically active and enjoys crafts and decorating.  Patient Care Team: Hali Marry, MD as PCP - General (Family Medicine) Pablo Lawrence (Allergy and Immunology) Darius Bump, [facility] as Pharmacist (Pharmacist)  Advanced Directives 01/15/2022 04/26/2021 03/07/2019 12/27/2018 12/09/2016 11/26/2016 09/07/2015  Does Patient Have a Medical Advance Directive? No No No No No No No  Would patient like information on creating a medical advance directive? No - Patient declined Yes (MAU/Ambulatory/Procedural Areas - Information given) No - Patient declined No - Patient declined No - Patient declined No - Patient declined Yes - Educational materials given    Hospital Utilization Over the Past 12 Months: # of hospitalizations or ER visits: 0 # of surgeries: 0  Review of Systems    Patient reports that her overall health is unchanged compared to last year.  History obtained from chart review and the patient  Patient  Reported Readings (BP, Pulse, CBG, Weight, etc) none  Pain Assessment Pain : No/denies pain     Current Medications & Allergies (verified) Allergies as of 01/15/2022       Reactions   Ace Inhibitors Swelling   Tramadol Other (See Comments)   Night sweats, bad dreams   Celecoxib Other (See Comments)   REACTION: hair loss   Cetirizine Hcl Other (See Comments)   REACTION: fatigue   Penicillins Other (See Comments)   Unknown   Sulfonamide Derivatives Rash        Medication List        Accurate as of January 15, 2022 10:28 AM. If you have any questions, ask your nurse or doctor.          Allergy Relief 180 MG tablet Generic drug: fexofenadine TAKE 1 TABLET EVERY DAY   amLODipine 10 MG tablet Commonly known as: NORVASC TAKE 1 TABLET EVERY DAY (MUST KEEP SCHEDULED APPT)   budesonide-formoterol 80-4.5 MCG/ACT inhaler Commonly known as: SYMBICORT Inhale 2 puffs into the lungs 2 (two) times daily.   CALCIUM PO Take 1 tablet by mouth at bedtime.   Cholecalciferol 125 MCG (5000 UT) capsule Take 5,000 Units by mouth daily.   diltiazem 30 MG tablet Commonly known as: Cardizem Take 1 tablet every 4 hours AS NEEDED for heart rate >100 as long as top bp >100   famotidine 20 MG tablet Commonly known as: PEPCID TAKE 1 TABLET TWICE DAILY   hydrALAZINE 50 MG tablet Commonly known as: APRESOLINE TAKE 1 TABLET(50 MG) BY MOUTH THREE TIMES DAILY   hydroxychloroquine 200 MG tablet Commonly known as: PLAQUENIL Take 200 mg by mouth daily.   Magnesium 200 MG Tabs  Take 1 tablet (200 mg total) by mouth daily.   montelukast 10 MG tablet Commonly known as: SINGULAIR TAKE 1 TABLET AT BEDTIME   MULTIVITAMIN PO Take 1 tablet by mouth at bedtime.   nitrofurantoin (macrocrystal-monohydrate) 100 MG capsule Commonly known as: Macrobid Take 1 capsule (100 mg total) by mouth 2 (two) times daily.   omeprazole 20 MG capsule Commonly known as: PRILOSEC TAKE 1 CAPSULE EVERY  DAY   Pataday 0.2 % Soln Generic drug: Olopatadine HCl Place 1 drop into both eyes daily as needed.   potassium chloride SA 20 MEQ tablet Commonly known as: KLOR-CON M TAKE 1 TABLET EVERY DAY   simvastatin 20 MG tablet Commonly known as: ZOCOR TAKE 1 TABLET (20 MG TOTAL) BY MOUTH EVERY EVENING. MUST SCHEDULE YEARLY EXAM AND LABS FOR REFILLS   sotalol 80 MG tablet Commonly known as: BETAPACE TAKE 1 TABLET (80 MG TOTAL) BY MOUTH 2 (TWO) TIMES DAILY. PLEASE KEEP UPCOMING APPOINTMENT FOR ANY FUTURE REFILLS   Zinc 50 MG Tabs Take 1 tablet by mouth daily.        History (reviewed): Past Medical History:  Diagnosis Date   Allergy    Aortic atherosclerosis (HCC)    Arthritis    "lower back; knees" (11/26/2016)   Asthma    Chronic atrial fibrillation (HCC)    CKD (chronic kidney disease), stage III (HCC)    patient unaware of this on 11/26/2016   Complication of anesthesia    "had trouble waking me up in the late 1990's after gallbladder OR"   COPD (chronic obstructive pulmonary disease) (HCC)    GERD (gastroesophageal reflux disease)    Heart murmur    "dx'd when I was a little kid"   History of hiatal hernia    Hyperlipidemia    Hypertension    Hypoglycemia    Past Surgical History:  Procedure Laterality Date   CHOLECYSTECTOMY OPEN  1990s   TONSILLECTOMY     TUBAL LIGATION     Family History  Problem Relation Age of Onset   CAD Mother        HEART ATTACK   Colon cancer Father        KIDNEY FAILURE/COLON CANCER   Atrial fibrillation Brother    Heart failure Maternal Grandmother    Kidney failure Maternal Grandfather    Heart attack Paternal Grandmother    Healthy Brother    Social History   Socioeconomic History   Marital status: Divorced    Spouse name: Not on file   Number of children: 3   Years of education: 14   Highest education level: Associate degree: academic program  Occupational History   Occupation: stages houses for sale    Comment: self  employed   Occupation: Retired.  Tobacco Use   Smoking status: Never   Smokeless tobacco: Never  Vaping Use   Vaping Use: Never used  Substance and Sexual Activity   Alcohol use: Not Currently   Drug use: No   Sexual activity: Not Currently  Other Topics Concern   Not on file  Social History Narrative   Lives alone. One son lives close by incase she needs anything. She enjoys crafting and decorating.    Social Determinants of Health   Financial Resource Strain: Low Risk    Difficulty of Paying Living Expenses: Not hard at all  Food Insecurity: No Food Insecurity   Worried About Programme researcher, broadcasting/film/video in the Last Year: Never true   [employer]  in the Last Year: Never true  Transportation Needs: No Transportation Needs   Lack of Transportation (Medical): No   Lack of Transportation (Non-Medical): No  Physical Activity: Inactive   Days of Exercise per Week: 0 days   Minutes of Exercise per Session: 0 min  Stress: Stress Concern Present   Feeling of Stress : Rather much  Social Connections: Moderately Isolated   Frequency of Communication with Friends and Family: More than three times a week   Frequency of Social Gatherings with Friends and Family: Once a week   Attends Religious Services: Never   Marine scientist or Organizations: Yes   Attends Music therapist: More than 4 times per year   Marital Status: Divorced    Activities of Daily Living In your present state of health, do you have any difficulty performing the following activities: 01/15/2022 01/13/2022  Hearing? N N  Vision? N N  Difficulty concentrating or making decisions? N N  Walking or climbing stairs? Y Y  Comment due to arthritis and copd -  Dressing or bathing? N N  Doing errands, shopping? N N  Preparing Food and eating ? N N  Using the Toilet? N N  In the past six months, have you accidently leaked urine? Y Y  Do you have problems with loss of bowel control? N N  Managing your  Medications? N N  Managing your Finances? N N  Housekeeping or managing your Housekeeping? N N  Some recent data might be hidden    Patient Education/ Literacy How often do you need to have someone help you when you read instructions, pamphlets, or other written materials from your doctor or pharmacy?: 1 - Never What is the last grade level you completed in school?: 12th grade and 2 years of college.  Exercise Current Exercise Habits: The patient does not participate in regular exercise at present, Exercise limited by: orthopedic condition(s) (arthritis)  Diet Patient reports consuming 2 meals a day and 1 snack(s) a day Patient reports that her primary diet is: Regular Patient reports that she does have regular access to food.   Depression Screen PHQ 2/9 Scores 01/15/2022 06/05/2020 03/07/2019 04/23/2018 12/09/2016 09/07/2015 08/07/2014  PHQ - 2 Score 4 2 0 2 0 2 0  PHQ- 9 Score 14 10 - 8 - 9 -     Fall Risk Fall Risk  01/15/2022 01/13/2022 04/26/2021 07/13/2019 03/07/2019  Falls in the past year? 0 0 1 0 0  Number falls in past yr: 0 - 0 - -  Injury with Fall? 0 - 1 - -  Risk for fall due to : No Fall Risks - No Fall Risks - -  Follow up Falls evaluation completed - Falls prevention discussed;Falls evaluation completed - Falls prevention discussed     Objective:  Rebekah Harrell seemed alert and oriented and she participated appropriately during our telephone visit.  Blood Pressure Weight BMI  BP Readings from Last 3 Encounters:  01/01/22 120/60  07/24/21 (!) 155/74  04/26/21 (!) 113/44   Wt Readings from Last 3 Encounters:  01/01/22 174 lb 6.4 oz (79.1 kg)  07/24/21 179 lb (81.2 kg)  04/26/21 178 lb (80.7 kg)   BMI Readings from Last 1 Encounters:  01/01/22 30.89 kg/m    *Unable to obtain current vital signs, weight, and BMI due to telephone visit type  Hearing/Vision  Rebekah Harrell did not seem to have difficulty with hearing/understanding during the telephone  conversation Reports that she has  had a formal eye exam by an eye care professional within the past year Reports that she has not had a formal hearing evaluation within the past year *Unable to fully assess hearing and vision during telephone visit type  Cognitive Function: 6CIT Screen 01/15/2022 03/07/2019  What Year? 0 points 0 points  What month? 0 points 0 points  What time? 0 points 0 points  Count back from 20 0 points 0 points  Months in reverse 0 points 0 points  Repeat phrase 0 points 2 points  Total Score 0 2   (Normal:0-7, Significant for Dysfunction: >8)  Normal Cognitive Function Screening: Yes   Immunization & Health Maintenance Record Immunization History  Administered Date(s) Administered   Td 05/24/2001   Tdap 07/02/2019    Health Maintenance  Topic Date Due   COVID-19 Vaccine (1) 01/31/2022 (Originally 04/20/1942)   Zoster Vaccines- Shingrix (1 of 2) 04/15/2022 (Originally [birth year]20/1960)   Pneumonia Vaccine 43+ Years old (1 - PCV) 01/15/2023 (Originally 10/21/1947)   INFLUENZA VACCINE  03/21/2024 (Originally 07/22/2021)   TETANUS/TDAP  07/01/2029   DEXA SCAN  Completed   HPV VACCINES  Aged Out       Assessment  This is a routine wellness examination for Rebekah Harrell.  Health Maintenance: Due or Overdue There are no preventive care reminders to display for this patient.   Rebekah Harrell does not need a referral for Community Assistance: Care Management:   no Social Work:    no Prescription Assistance:  no Nutrition/Diabetes Education:  no   Plan:  Personalized Goals  Goals Addressed               This Visit's Progress     Patient Stated (pt-stated)        Would like to be generally healthier.       Personalized Health Maintenance & Screening Recommendations  Pneumococcal vaccine  Influenza vaccine Bone densitometry screening Shingrix  She is scheduled for her bone density in February, 2023.   Lung Cancer Screening Recommended:  no (Low Dose CT Chest recommended if Age 77-80 years, 30 pack-year currently smoking OR have quit w/in past 15 years) Hepatitis C Screening recommended: no HIV Screening recommended: no  Advanced Directives: Written information was not prepared per patient's request.  Referrals & Orders No orders of the defined types were placed in this encounter.   Follow-up Plan Follow-up with Hali Marry, MD as planned Please have your bone density results faxed to our office. Medicare wellness visit in one year. AVS printed and mailed to the patient.   I have personally reviewed and noted the following in the patients chart:   Medical and social history Use of alcohol, tobacco or illicit drugs  Current medications and supplements Functional ability and status Nutritional status Physical activity Advanced directives List of other physicians Hospitalizations, surgeries, and ER visits in previous 12 months Vitals Screenings to include cognitive, depression, and falls Referrals and appointments  In addition, I have reviewed and discussed with Rebekah Harrell certain preventive protocols, quality metrics, and best practice recommendations. A written personalized care plan for preventive services as well as general preventive health recommendations is available and can be mailed to the patient at her request.      Tinnie Gens, RN  01/15/2022

## 2022-01-16 ENCOUNTER — Other Ambulatory Visit: Payer: Self-pay

## 2022-01-16 MED ORDER — OMEPRAZOLE 20 MG PO CPDR: 20.0000 mg | DELAYED_RELEASE_CAPSULE | Freq: Every day | ORAL | 0 refills | Status: DC

## 2022-01-16 NOTE — Telephone Encounter (Signed)
Lebron Conners needs a 10 day supply of omeprazole sent to the local pharmacy. She will not get mail order in time.

## 2022-01-21 ENCOUNTER — Ambulatory Visit (HOSPITAL_BASED_OUTPATIENT_CLINIC_OR_DEPARTMENT_OTHER): Payer: Medicare HMO

## 2022-01-24 DIAGNOSIS — M85852 Other specified disorders of bone density and structure, left thigh: Secondary | ICD-10-CM | POA: Diagnosis not present

## 2022-01-24 DIAGNOSIS — M8588 Other specified disorders of bone density and structure, other site: Secondary | ICD-10-CM | POA: Diagnosis not present

## 2022-01-24 DIAGNOSIS — M818 Other osteoporosis without current pathological fracture: Secondary | ICD-10-CM | POA: Diagnosis not present

## 2022-01-31 DIAGNOSIS — G8929 Other chronic pain: Secondary | ICD-10-CM | POA: Diagnosis not present

## 2022-01-31 DIAGNOSIS — R8290 Unspecified abnormal findings in urine: Secondary | ICD-10-CM | POA: Diagnosis not present

## 2022-01-31 DIAGNOSIS — M159 Polyosteoarthritis, unspecified: Secondary | ICD-10-CM | POA: Diagnosis not present

## 2022-01-31 DIAGNOSIS — M47816 Spondylosis without myelopathy or radiculopathy, lumbar region: Secondary | ICD-10-CM | POA: Diagnosis not present

## 2022-01-31 DIAGNOSIS — M4316 Spondylolisthesis, lumbar region: Secondary | ICD-10-CM | POA: Diagnosis not present

## 2022-01-31 DIAGNOSIS — R7989 Other specified abnormal findings of blood chemistry: Secondary | ICD-10-CM | POA: Diagnosis not present

## 2022-01-31 DIAGNOSIS — M329 Systemic lupus erythematosus, unspecified: Secondary | ICD-10-CM | POA: Diagnosis not present

## 2022-01-31 DIAGNOSIS — M545 Low back pain, unspecified: Secondary | ICD-10-CM | POA: Diagnosis not present

## 2022-01-31 DIAGNOSIS — M5136 Other intervertebral disc degeneration, lumbar region: Secondary | ICD-10-CM | POA: Diagnosis not present

## 2022-02-13 ENCOUNTER — Telehealth: Payer: Self-pay | Admitting: *Deleted

## 2022-02-13 ENCOUNTER — Other Ambulatory Visit: Payer: Self-pay | Admitting: Family Medicine

## 2022-02-13 NOTE — Chronic Care Management (AMB) (Unsigned)
°  Care Management   Note  02/13/2022 Name: Rebekah Harrell MRN: [id number] DOB: 01-Feb-1941  Rebekah Harrell is a 81 y.o. year old female who is a primary care patient of Hali Marry, MD and is actively engaged with the care management team. I reached out to Tally Joe by phone today to assist with re-scheduling a follow up visit with the Pharmacist  Follow up plan: Unsuccessful telephone outreach attempt made. A HIPAA compliant phone message was left for the patient providing contact information and requesting a return call.   Julian Hy, [facility]  Direct Dial: [phone]

## 2022-02-18 ENCOUNTER — Other Ambulatory Visit: Payer: Self-pay | Admitting: Family Medicine

## 2022-02-24 ENCOUNTER — Encounter: Payer: Self-pay | Admitting: Family Medicine

## 2022-02-24 ENCOUNTER — Ambulatory Visit (INDEPENDENT_AMBULATORY_CARE_PROVIDER_SITE_OTHER): Payer: Medicare HMO | Admitting: Family Medicine

## 2022-02-24 ENCOUNTER — Other Ambulatory Visit: Payer: Self-pay

## 2022-02-24 VITALS — BP 135/48 | HR 51 | Ht 63.0 in | Wt 175.0 lb

## 2022-02-24 DIAGNOSIS — K2100 Gastro-esophageal reflux disease with esophagitis, without bleeding: Secondary | ICD-10-CM

## 2022-02-24 DIAGNOSIS — R2241 Localized swelling, mass and lump, right lower limb: Secondary | ICD-10-CM | POA: Diagnosis not present

## 2022-02-24 DIAGNOSIS — J449 Chronic obstructive pulmonary disease, unspecified: Secondary | ICD-10-CM

## 2022-02-24 DIAGNOSIS — E7800 Pure hypercholesterolemia, unspecified: Secondary | ICD-10-CM

## 2022-02-24 DIAGNOSIS — N1831 Chronic kidney disease, stage 3a: Secondary | ICD-10-CM | POA: Diagnosis not present

## 2022-02-24 DIAGNOSIS — I10 Essential (primary) hypertension: Secondary | ICD-10-CM | POA: Diagnosis not present

## 2022-02-24 DIAGNOSIS — I480 Paroxysmal atrial fibrillation: Secondary | ICD-10-CM | POA: Diagnosis not present

## 2022-02-24 DIAGNOSIS — R0602 Shortness of breath: Secondary | ICD-10-CM | POA: Diagnosis not present

## 2022-02-24 DIAGNOSIS — K219 Gastro-esophageal reflux disease without esophagitis: Secondary | ICD-10-CM | POA: Insufficient documentation

## 2022-02-24 MED ORDER — SIMVASTATIN 20 MG PO TABS: 20.0000 mg | ORAL_TABLET | Freq: Every evening | ORAL | 3 refills | Status: DC

## 2022-02-24 MED ORDER — OMEPRAZOLE 20 MG PO CPDR: 20.0000 mg | DELAYED_RELEASE_CAPSULE | Freq: Every day | ORAL | 3 refills | Status: DC

## 2022-02-24 MED ORDER — FAMOTIDINE 20 MG PO TABS: 20.0000 mg | ORAL_TABLET | Freq: Every day | ORAL | 3 refills | Status: DC

## 2022-02-24 NOTE — Assessment & Plan Note (Signed)
Do think she really has a more mixed picture of restrictive and obstructive disease.  Last spirometry was almost 2 years ago.  We discussed referring for full PFTs for further evaluation since she has noticed an increase in shortness of breath and feels like she does not respond well to albuterol. ?

## 2022-02-24 NOTE — Assessment & Plan Note (Signed)
Pressure looks great. 

## 2022-02-24 NOTE — Progress Notes (Signed)
Established Patient Office Visit  Subjective:  Patient ID: Rebekah Harrell, female    DOB: [birth year]  Age: 81 y.o. MRN: [id number]  CC:  Chief Complaint  Patient presents with   Follow-up         HPI RHYLEI EMICK presents for   Hypertension- Pt denies chest pain, SOB, dizziness, or heart palpitations.  Taking meds as directed w/o problems.  Denies medication side effects.    F/U COPD -she feels that her shortness of breath has actually gotten worse.  She says she can even roll out the trash cans to the curb now because she has to roll them up hill.  Last spirometry was in April 2021.  FVC of 78%, FEV1 of 74% with a ratio of 70.  She has not responded well to albuterol.  Also reports right foot swelling that she has noticed really for the last couple of days going up into her lower leg.  She denies any known injury or trauma.  She just had a renal function checked on February 10 through [facility] and it was stable.  She also had a recent DEXA scan ordered by the rheumatologist.  Also been dealing with a lot of low back pain she says she gets complete relief when she sits but when she stands especially for any prolonged period she gets bilateral low back pain extent then starts to radiate upwards towards her shoulder blades.  He did see Dr. Jens Som in January.  He really wanted her to get back on some type of anticoagulant but unfortunately the Eliquis cost about $45 a month.  Past Medical History:  Diagnosis Date   Allergy    Aortic atherosclerosis (HCC)    Arthritis    "lower back; knees" (11/26/2016)   Asthma    Chronic atrial fibrillation (HCC)    CKD (chronic kidney disease), stage III (HCC)    patient unaware of this on 11/26/2016   Complication of anesthesia    "had trouble waking me up in the late 1990's after gallbladder OR"   COPD (chronic obstructive pulmonary disease) (HCC)    GERD (gastroesophageal reflux disease)    Heart murmur    "dx'd when I was a  little kid"   History of hiatal hernia    Hyperlipidemia    Hypertension    Hypoglycemia     Past Surgical History:  Procedure Laterality Date   CHOLECYSTECTOMY OPEN  1990s   TONSILLECTOMY     TUBAL LIGATION      Family History  Problem Relation Age of Onset   CAD Mother        HEART ATTACK   Colon cancer Father        KIDNEY FAILURE/COLON CANCER   Atrial fibrillation Brother    Heart failure Maternal Grandmother    Kidney failure Maternal Grandfather    Heart attack Paternal Grandmother    Healthy Brother     Social History   Socioeconomic History   Marital status: Divorced    Spouse name: Not on file   Number of children: 3   Years of education: 14   Highest education level: Associate degree: academic program  Occupational History   Occupation: stages houses for sale    Comment: self employed   Occupation: Retired.  Tobacco Use   Smoking status: Never   Smokeless tobacco: Never  Vaping Use   Vaping Use: Never used  Substance and Sexual Activity   Alcohol use: Not Currently   Drug  use: No   Sexual activity: Not Currently  Other Topics Concern   Not on file  Social History Narrative   Lives alone. One son lives close by incase she needs anything. She enjoys crafting and decorating.    Social Determinants of Health   Financial Resource Strain: Low Risk    Difficulty of Paying Living Expenses: Not hard at all  Food Insecurity: No Food Insecurity   Worried About Programme researcher, broadcasting/film/video in the Last Year: Never true   Ran Out of Food in the Last Year: Never true  Transportation Needs: No Transportation Needs   Lack of Transportation (Medical): No   Lack of Transportation (Non-Medical): No  Physical Activity: Inactive   Days of Exercise per Week: 0 days   Minutes of Exercise per Session: 0 min  Stress: Stress Concern Present   Feeling of Stress : Rather much  Social Connections: Moderately Isolated   Frequency of Communication with Friends and Family: More  than three times a week   Frequency of Social Gatherings with Friends and Family: Once a week   Attends Religious Services: Never   Database administrator or Organizations: Yes   Attends Engineer, structural: More than 4 times per year   Marital Status: Divorced  Catering manager Violence: Not At Risk   Fear of Current or Ex-Partner: No   Emotionally Abused: No   Physically Abused: No   Sexually Abused: No    Outpatient Medications Prior to Visit  Medication Sig Dispense Refill   ALLERGY RELIEF 180 MG tablet TAKE 1 TABLET EVERY DAY 90 tablet 1   amLODipine (NORVASC) 10 MG tablet TAKE 1 TABLET EVERY DAY (MUST KEEP SCHEDULED APPT) 90 tablet 0   CALCIUM PO Take 1 tablet by mouth at bedtime.     Cholecalciferol 125 MCG (5000 UT) capsule Take 5,000 Units by mouth daily.     diltiazem (CARDIZEM) 30 MG tablet Take 1 tablet every 4 hours AS NEEDED for heart rate >100 as long as top bp >100 45 tablet 1   hydrALAZINE (APRESOLINE) 50 MG tablet TAKE 1 TABLET(50 MG) BY MOUTH THREE TIMES DAILY 90 tablet 3   hydroxychloroquine (PLAQUENIL) 200 MG tablet Take 200 mg by mouth daily.     Magnesium 200 MG TABS Take 1 tablet (200 mg total) by mouth daily. 60 each    montelukast (SINGULAIR) 10 MG tablet TAKE 1 TABLET AT BEDTIME 90 tablet 1   Multiple Vitamins-Minerals (MULTIVITAMIN PO) Take 1 tablet by mouth at bedtime.     potassium chloride SA (KLOR-CON) 20 MEQ tablet TAKE 1 TABLET EVERY DAY 90 tablet 0   sotalol (BETAPACE) 80 MG tablet TAKE 1 TABLET (80 MG TOTAL) BY MOUTH 2 (TWO) TIMES DAILY. PLEASE KEEP UPCOMING APPOINTMENT FOR ANY FUTURE REFILLS 180 tablet 60   Zinc 50 MG TABS Take 1 tablet by mouth daily.     famotidine (PEPCID) 20 MG tablet Take 1 tablet (20 mg total) by mouth 2 (two) times daily. NO REFILLS. NEEDS A VISIT WITH PROVIDER. 180 tablet 0   nitrofurantoin, macrocrystal-monohydrate, (MACROBID) 100 MG capsule Take 1 capsule (100 mg total) by mouth 2 (two) times daily. 10 capsule 0    omeprazole (PRILOSEC) 20 MG capsule Take 1 capsule (20 mg total) by mouth daily. 10 capsule 0   simvastatin (ZOCOR) 20 MG tablet TAKE 1 TABLET (20 MG TOTAL) BY MOUTH EVERY EVENING. MUST SCHEDULE YEARLY EXAM AND LABS FOR REFILLS 90 tablet 3   budesonide-formoterol (SYMBICORT)  80-4.5 MCG/ACT inhaler Inhale 2 puffs into the lungs 2 (two) times daily. 1 Inhaler 3   PATADAY 0.2 % SOLN Place 1 drop into both eyes daily as needed.     No facility-administered medications prior to visit.    Allergies  Allergen Reactions   Ace Inhibitors Swelling   Tramadol Other (See Comments)    Night sweats, bad dreams   Celecoxib Other (See Comments)    REACTION: hair loss   Cetirizine Hcl Other (See Comments)    REACTION: fatigue   Penicillins Other (See Comments)    Unknown   Sulfonamide Derivatives Rash    ROS Review of Systems    Objective:    Physical Exam Constitutional:      Appearance: Normal appearance. She is well-developed.  HENT:     Head: Normocephalic and atraumatic.  Cardiovascular:     Rate and Rhythm: Normal rate and regular rhythm.     Heart sounds: Normal heart sounds.  Pulmonary:     Effort: Pulmonary effort is normal.     Breath sounds: Normal breath sounds.  Skin:    General: Skin is warm and dry.  Neurological:     Mental Status: She is alert and oriented to person, place, and time.  Psychiatric:        Behavior: Behavior normal.    BP (!) 135/48   Pulse (!) 51   Ht 5\' 3"  (1.6 m)   Wt 175 lb (79.4 kg)   SpO2 97%   BMI 31.00 kg/m  Wt Readings from Last 3 Encounters:  02/24/22 175 lb (79.4 kg)  01/01/22 174 lb 6.4 oz (79.1 kg)  07/24/21 179 lb (81.2 kg)     Health Maintenance Due  Topic Date Due   COVID-19 Vaccine (1) Never done    There are no preventive care reminders to display for this patient.  Lab Results  Component Value Date   TSH 3.59 05/03/2021   Lab Results  Component Value Date   WBC 8.5 05/03/2021   HGB 13.3 05/03/2021   HCT 41.7  05/03/2021   MCV 88.0 05/03/2021   PLT 304 05/03/2021   Lab Results  Component Value Date   NA 141 05/03/2021   K 5.0 05/03/2021   CO2 28 05/03/2021   GLUCOSE 89 05/03/2021   BUN 16 05/03/2021   CREATININE 1.15 (H) 05/03/2021   BILITOT 0.4 05/03/2021   ALKPHOS 64 11/18/2017   AST 19 05/03/2021   ALT 15 05/03/2021   PROT 7.4 05/03/2021   ALBUMIN 4.3 11/18/2017   CALCIUM 9.7 05/03/2021   ANIONGAP 8 12/10/2016   Lab Results  Component Value Date   CHOL 163 05/03/2021   Lab Results  Component Value Date   HDL 47 (L) 05/03/2021   Lab Results  Component Value Date   LDLCALC 83 05/03/2021   Lab Results  Component Value Date   TRIG 251 (H) 05/03/2021   Lab Results  Component Value Date   CHOLHDL 3.5 05/03/2021   Lab Results  Component Value Date   HGBA1C 5.8 (H) 06/19/2016      Assessment & Plan:   Problem List Items Addressed This Visit       Cardiovascular and Mediastinum   HYPERTENSION, BENIGN ESSENTIAL    Pressure looks great.      Relevant Medications   simvastatin (ZOCOR) 20 MG tablet   Atrial fibrillation (HCC)    He is currently off of Eliquis but still looking into some options did encourage her to  apply directly to the drug company to see if she might qualify for assistance.  She also wanted a refill on her sotalol she actually just saw Dr. Jens Som in January so we will reach out to request a refill for her.  It looks like the hydralazine was refilled.      Relevant Medications   simvastatin (ZOCOR) 20 MG tablet     Respiratory   COPD (chronic obstructive pulmonary disease) (HCC) - Primary    Do think she really has a more mixed picture of restrictive and obstructive disease.  Last spirometry was almost 2 years ago.  We discussed referring for full PFTs for further evaluation since she has noticed an increase in shortness of breath and feels like she does not respond well to albuterol.      Relevant Orders   Pulmonary function test      Digestive   GERD (gastroesophageal reflux disease)    Will refill her PPI or H2 blocker.        Relevant Medications   famotidine (PEPCID) 20 MG tablet   omeprazole (PRILOSEC) 20 MG capsule     Genitourinary   CKD (chronic kidney disease) stage 3, GFR 30-59 ml/min (HCC)    Renal function check in Feb. Stable.          Other   Hyperlipidemia    She is due for repeat lipids in May.      Relevant Medications   simvastatin (ZOCOR) 20 MG tablet   Other Visit Diagnoses     SOB (shortness of breath)       Localized swelling of right foot           Right foot swelling - She does have localized swelling of her right lower extremity from about the mid tibia down trace edema.  Unclear etiology.  No trauma no injury no significant pain or discomfort it is a little bit asymmetric.  She has been eating some Valentines candy recently we discussed cutting back on salt intake and more rich foods and seeing if it improves over the next couple of days if it does not, or worsens, or changes in nature, then please let us know.   Meds ordered this encounter  Medications   simvastatin (ZOCOR) 20 MG tablet    Sig: Take 1 tablet (20 mg total) by mouth every evening. Must schedule yearly exam and labs for refills    Dispense:  90 tablet    Refill:  3   famotidine (PEPCID) 20 MG tablet    Sig: Take 1 tablet (20 mg total) by mouth at bedtime.    Dispense:  90 tablet    Refill:  3   omeprazole (PRILOSEC) 20 MG capsule    Sig: Take 1 capsule (20 mg total) by mouth daily. In AM    Dispense:  90 capsule    Refill:  3    Follow-up: Return in about 6 months (around 08/27/2022).    Nani Gasser, MD

## 2022-02-24 NOTE — Assessment & Plan Note (Signed)
He is currently off of Eliquis but still looking into some options did encourage her to apply directly to the drug company to see if she might qualify for assistance.  She also wanted a refill on her sotalol she actually just saw Dr. Jens Som in January so we will reach out to request a refill for her.  It looks like the hydralazine was refilled. ?

## 2022-02-24 NOTE — Assessment & Plan Note (Signed)
Will refill her PPI or H2 blocker.   ?

## 2022-02-24 NOTE — Assessment & Plan Note (Signed)
She is due for repeat lipids in May. ?

## 2022-02-24 NOTE — Assessment & Plan Note (Addendum)
Renal function check in Feb. Stable.   ?

## 2022-02-25 ENCOUNTER — Telehealth: Payer: Self-pay | Admitting: Cardiology

## 2022-02-25 ENCOUNTER — Other Ambulatory Visit: Payer: Self-pay | Admitting: Cardiology

## 2022-02-25 MED ORDER — SOTALOL HCL 80 MG PO TABS
80.0000 mg | ORAL_TABLET | Freq: Three times a day (TID) | ORAL | 3 refills | Status: DC
Start: 2022-02-25 — End: 2022-03-03

## 2022-02-25 NOTE — Telephone Encounter (Signed)
? ? ?*  STAT* If patient is at the pharmacy, call can be transferred to refill team. ? ? ?1. Which medications need to be refilled? (please list name of each medication and dose if known) sotalol (BETAPACE) 80 MG tablet ? ?2. Which pharmacy/location (including street and city if local pharmacy) is medication to be sent to?[id number] - [place], [place] - [address] ? ?3. Do they need a 30 day or 90 day supply? 2 weeks supply ? ?Pt needs emergency refill for 2 weeks supply while waiting for her meds in the mail ?

## 2022-02-25 NOTE — Chronic Care Management (AMB) (Signed)
?  Care Management  ? ?Note ? ?02/25/2022 ?Name: Rebekah Harrell MRN: [id number] DOB: [birth year] ? ?Rebekah Harrell is a 81 y.o. year old female who is a primary care patient of Hali Marry, MD and is actively engaged with the care management team. I reached out to Tally Joe by phone today to assist with re-scheduling a follow up visit with the Pharmacist ? ?Follow up plan: ?2nd Unsuccessful telephone outreach attempt made. A HIPAA compliant phone message was left for the patient providing contact information and requesting a return call.  ? ?Lylliana Kitamura, CCMA ?Care Guide, Embedded Care Coordination ?[place]  [facility]  ?Direct Dial: [phone] ? ? ?

## 2022-03-03 ENCOUNTER — Other Ambulatory Visit: Payer: Self-pay | Admitting: *Deleted

## 2022-03-03 ENCOUNTER — Telehealth: Payer: Self-pay | Admitting: Cardiology

## 2022-03-03 MED ORDER — SOTALOL HCL 80 MG PO TABS: 80.0000 mg | ORAL_TABLET | Freq: Three times a day (TID) | ORAL | 3 refills | Status: DC

## 2022-03-03 NOTE — Telephone Encounter (Signed)
?*  STAT* If patient is at the pharmacy, call can be transferred to refill team. ? ? ?1. Which medications need to be refilled? (please list name of each medication and dose if known)  need a new prescription for Sotalol- need enough until mail order comesr  ? ?2. Which pharmacy/location (including street and city if local pharmacy) is medication to be sent to?[facility]  [address], [place]  ? ?3. Do they need a 30 day or 90 day supply? Need this today please- 28 for 14 days ? ?

## 2022-03-03 NOTE — Telephone Encounter (Signed)
Refills have been sent to the pharmacy 

## 2022-03-03 NOTE — Telephone Encounter (Signed)
Patient is following up, regarding refill request. She states she is completely out of medication. Please assist. ?

## 2022-03-06 ENCOUNTER — Telehealth: Payer: Medicare HMO

## 2022-03-06 ENCOUNTER — Other Ambulatory Visit: Payer: Self-pay | Admitting: *Deleted

## 2022-03-06 MED ORDER — SOTALOL HCL 80 MG PO TABS: 80.0000 mg | ORAL_TABLET | Freq: Three times a day (TID) | ORAL | 3 refills | Status: DC

## 2022-04-02 NOTE — Chronic Care Management (AMB) (Signed)
?  Care Management  ? ?Note ? ?04/02/2022 ?Name: Rebekah Harrell MRN: [id number] DOB: [birth year] ? ?DHAMAR GREGORY is a 81 y.o. year old female who is a primary care patient of Agapito Games, MD and is actively engaged with the care management team. I reached out to [name] by phone today to assist with re-scheduling a follow up visit with the Pharmacist ? ?Follow up plan: ?We have been unable to make contact with the patient for follow up. The care management team is available to follow up with the patient after provider conversation with the patient regarding recommendation for care management engagement and subsequent re-referral to the care management team.  ? ?Irine Heminger, CCMA ?Care Guide, Embedded Care Coordination ?[place]  [facility]  ?Direct Dial: [phone] ? ? ?

## 2022-04-16 ENCOUNTER — Other Ambulatory Visit: Payer: Self-pay | Admitting: *Deleted

## 2022-04-16 DIAGNOSIS — R0602 Shortness of breath: Secondary | ICD-10-CM

## 2022-04-16 DIAGNOSIS — J449 Chronic obstructive pulmonary disease, unspecified: Secondary | ICD-10-CM

## 2022-05-06 ENCOUNTER — Other Ambulatory Visit: Payer: Self-pay | Admitting: Family Medicine

## 2022-05-06 NOTE — Progress Notes (Signed)
Synopsis: Referred for COPD by Hali Marry, *  Subjective:   PATIENT ID: Rebekah Harrell GENDER: female DOB: 1941-02-05, MRN: [id number]  Chief Complaint  Patient presents with   Pulmonary Consult    Referred by Dr. Beatrice Lecher. Pt c/o SOB over the past 20 years, worse x 10 years. She states she is winded sometimes just walking around her house and doing chores. She is unable to take her trash to the end of her driveway anymore.    81yF with history of allergy, asthma, COPD, AF, CKD, GERD, hiatal hernia, +ANA with recurrent angioedema of unclear etiology on plaquenil followed by [facility], referred for COPD and worsening DOE. She never had covid-19 infection.  She says she's had breathing problems ever since she was little, attributes it at least in part to secondhand smoke exposure in past.   Now has DOE where she has stop speaking to catch her breath if she's in conversation however she is able to relate a good history today without interruption. DOE to 100 feet. She has no cough. No orthopnea. Weight has gone up recently. She was on course of prednisone for 10 days over January prescribed by rheumatologist but she noticed no effect on her breathing. Very rare sensation of dysphagia - doesn't frequently get choked on food/liquid. She snores once in a while. No PND. She is sleepy during the day but attributes it to her antihistamine regimen.   She didn't tolerate wixela well due to the dry powder formulation causing cough.   She sees allergist at [facility] in [place] sees Dr. Clarene Essex. On a bunch of antihistamines due to recurrent angioedema of unclear etiology. Has not been on AIT  Otherwise pertinent review of systems is negative.  No family history of lung disease  She worked staging houses in past, Hydrographic surveyor in past. Lots of secondhand smoke exposure but she never smoked herself, no vaping/MJ. Has a dog and cat at home.    Past Medical  History:  Diagnosis Date   Allergy    Aortic atherosclerosis ([place])    Arthritis    "lower back; knees" (11/26/2016)   Asthma    Chronic atrial fibrillation ([facility])    CKD (chronic kidney disease), stage III ([place])    patient unaware of this on [id number]   Complication of anesthesia    "had trouble waking me up in the late 1990's after gallbladder OR"   COPD (chronic obstructive pulmonary disease) ([facility])    GERD (gastroesophageal reflux disease)    Heart murmur    "dx'd when I was a little kid"   History of hiatal hernia    Hyperlipidemia    Hypertension    Hypoglycemia      Family History  Problem Relation Age of Onset   CAD Mother        HEART ATTACK   Colon cancer Father        KIDNEY FAILURE/COLON CANCER   Atrial fibrillation Brother    Healthy Brother    Heart failure Maternal Grandmother    Kidney failure Maternal Grandfather    Emphysema Maternal Grandfather    Heart attack Paternal Grandmother      Past Surgical History:  Procedure Laterality Date   CHOLECYSTECTOMY OPEN  1990s   TONSILLECTOMY     TUBAL LIGATION      Social History   Socioeconomic History   Marital status: Divorced    Spouse name: Not on file   Number of children: 3  Years of education: 76   Highest education level: Associate degree: academic program  Occupational History   Occupation: stages houses for sale    Comment: self employed   Occupation: Retired.  Tobacco Use   Smoking status: Never   Smokeless tobacco: Never  Vaping Use   Vaping Use: Never used  Substance and Sexual Activity   Alcohol use: Not Currently   Drug use: No   Sexual activity: Not Currently  Other Topics Concern   Not on file  Social History Narrative   Lives alone. One son lives close by incase she needs anything. She enjoys crafting and decorating.    Social Determinants of Health   Financial Resource Strain: Low Risk    Difficulty of Paying Living Expenses: Not hard at all  Food Insecurity: No Food  Insecurity   Worried About Charity fundraiser in the Last Year: Never true   [place] in the Last Year: Never true  Transportation Needs: No Transportation Needs   Lack of Transportation (Medical): No   Lack of Transportation (Non-Medical): No  Physical Activity: Inactive   Days of Exercise per Week: 0 days   Minutes of Exercise per Session: 0 min  Stress: Stress Concern Present   Feeling of Stress : Rather much  Social Connections: Moderately Isolated   Frequency of Communication with Friends and Family: More than three times a week   Frequency of Social Gatherings with Friends and Family: Once a week   Attends Religious Services: Never   Marine scientist or Organizations: Yes   Attends Music therapist: More than 4 times per year   Marital Status: Divorced  Human resources officer Violence: Not At Risk   Fear of Current or Ex-Partner: No   Emotionally Abused: No   Physically Abused: No   Sexually Abused: No     Allergies  Allergen Reactions   Ace Inhibitors Swelling   Tramadol Other (See Comments)    Night sweats, bad dreams   Celecoxib Other (See Comments)    REACTION: hair loss   Cetirizine Hcl Other (See Comments)    REACTION: fatigue   Penicillins Other (See Comments)    Unknown   Sulfonamide Derivatives Rash     Outpatient Medications Prior to Visit  Medication Sig Dispense Refill   ALLERGY RELIEF 180 MG tablet TAKE 1 TABLET EVERY DAY 90 tablet 1   amLODipine (NORVASC) 10 MG tablet TAKE 1 TABLET EVERY DAY (MUST KEEP SCHEDULED APPT) 90 tablet 0   CALCIUM PO Take 1 tablet by mouth at bedtime.     Cholecalciferol 125 MCG (5000 UT) capsule Take 5,000 Units by mouth daily.     diltiazem (CARDIZEM) 30 MG tablet Take 1 tablet every 4 hours AS NEEDED for heart rate >100 as long as top bp >100 45 tablet 1   famotidine (PEPCID) 20 MG tablet Take 1 tablet (20 mg total) by mouth at bedtime. 90 tablet 3   hydrALAZINE (APRESOLINE) 50 MG tablet TAKE 1  TABLET(50 MG) BY MOUTH THREE TIMES DAILY 90 tablet 3   hydroxychloroquine (PLAQUENIL) 200 MG tablet Take 200 mg by mouth daily.     Magnesium 200 MG TABS Take 1 tablet (200 mg total) by mouth daily. 60 each    montelukast (SINGULAIR) 10 MG tablet TAKE 1 TABLET AT BEDTIME 90 tablet 1   Multiple Vitamins-Minerals (MULTIVITAMIN PO) Take 1 tablet by mouth at bedtime.     omeprazole (PRILOSEC) 20 MG capsule Take 1  capsule (20 mg total) by mouth daily. In AM 90 capsule 3   potassium chloride SA (KLOR-CON M) 20 MEQ tablet TAKE 1 TABLET EVERY DAY 90 tablet 0   simvastatin (ZOCOR) 20 MG tablet Take 1 tablet (20 mg total) by mouth every evening. Must schedule yearly exam and labs for refills 90 tablet 3   sotalol (BETAPACE) 80 MG tablet Take 1 tablet (80 mg total) by mouth 3 (three) times daily. 270 tablet 3   Zinc 50 MG TABS Take 1 tablet by mouth daily.     No facility-administered medications prior to visit.       Objective:   Physical Exam:  General appearance: 81 y.o., female, NAD, conversant  Eyes: anicteric sclerae; PERRL, tracking appropriately HENT: NCAT; MMM Neck: Trachea midline; no lymphadenopathy, no JVD Lungs: CTAB, no crackles, no wheeze, with normal respiratory effort CV: RRR, no murmur  Abdomen: Soft, non-tender; non-distended, BS present  Extremities: No peripheral edema, warm Skin: Normal turgor and texture; no rash Psych: Appropriate affect Neuro: Alert and oriented to person and place, no focal deficit     Vitals:   05/08/22 1452  BP: 126/60  Pulse: (!) 55  Temp: 97.9 F (36.6 C)  TempSrc: Oral  SpO2: 95%  Weight: 178 lb 9.6 oz (81 kg)  Height: 5' 3.5" (1.613 m)   95% on RA BMI Readings from Last 3 Encounters:  05/08/22 31.14 kg/m  02/24/22 31.00 kg/m  01/01/22 30.89 kg/m   Wt Readings from Last 3 Encounters:  05/08/22 178 lb 9.6 oz (81 kg)  02/24/22 175 lb (79.4 kg)  01/01/22 174 lb 6.4 oz (79.1 kg)     CBC    Component Value Date/Time    WBC 8.5 05/03/2021 1231   RBC 4.74 05/03/2021 1231   HGB 13.3 05/03/2021 1231   HGB 12.9 11/18/2017 1205   HCT 41.7 05/03/2021 1231   HCT 38.2 11/18/2017 1205   PLT 304 05/03/2021 1231   PLT 306 11/18/2017 1205   MCV 88.0 05/03/2021 1231   MCV 84 11/18/2017 1205   MCH 28.1 05/03/2021 1231   MCHC 31.9 (L) 05/03/2021 1231   RDW 13.1 05/03/2021 1231   RDW 14.2 11/18/2017 1205   LYMPHSABS 3,360 06/19/2016 1136   MONOABS 640 06/19/2016 1136   EOSABS 240 06/19/2016 1136   BASOSABS 80 06/19/2016 1136    Eos 240  Chest Imaging: CXR 04/06/20 reviewed by me with prominent R hilum  Pulmonary Functions Testing Results:     View : No data to display.            Echocardiogram:   TTE 04/11/20: 1. Left ventricular ejection fraction, by estimation, is 55 to 60%. The  left ventricle has normal function. The left ventricle has no regional  wall motion abnormalities. Left ventricular diastolic parameters are  consistent with Grade II diastolic  dysfunction (pseudonormalization).   2. Right ventricular systolic function is normal. The right ventricular  size is normal. There is mildly elevated pulmonary artery systolic  pressure.   3. Left atrial size was moderately dilated.   4. The mitral valve is normal in structure. Mild to moderate mitral valve  regurgitation. No evidence of mitral stenosis.   5. The aortic valve is normal in structure. Aortic valve regurgitation is  not visualized. No aortic stenosis is present.   6. The inferior vena cava is normal in size with greater than 50%  respiratory variability, suggesting right atrial pressure of 3 mmHg.   7. Compared to  echocardiogram from 2017 MR from mild now is mild to  moderate, LA size increased to moderate enlargement from mild. Disatolic  dysfunction.       Assessment & Plan:   # DOE # history of allergy, asthma # Prominent R hilum Unclear etiology. Asthma/COPD from secondhand smoke exposure possible but had poor  steroid response earlier this year and not wheezy on exam. Alternatively consider ILD (prominent hilum and coarsened interstitium on CXR today), diastolic dysfunction (but euvolemic on exam today), angina, deconditioning.   # Recurrent angioedema, unclear etiology # Elevated ANA   Plan: - PFTs (breathing tests) you will be called to schedule  - HRCT Chest  - see you in 6 weeks to review results       Rebekah Hurter, MD [facility] 05/08/2022 6:02 PM

## 2022-05-08 ENCOUNTER — Encounter: Payer: Self-pay | Admitting: Student

## 2022-05-08 ENCOUNTER — Ambulatory Visit: Payer: Medicare HMO | Admitting: Student

## 2022-05-08 ENCOUNTER — Ambulatory Visit (INDEPENDENT_AMBULATORY_CARE_PROVIDER_SITE_OTHER): Payer: Medicare HMO

## 2022-05-08 VITALS — BP 126/60 | HR 55 | Temp 97.9°F | Ht 63.5 in | Wt 178.6 lb

## 2022-05-08 DIAGNOSIS — R0600 Dyspnea, unspecified: Secondary | ICD-10-CM | POA: Diagnosis not present

## 2022-05-08 DIAGNOSIS — R0609 Other forms of dyspnea: Secondary | ICD-10-CM

## 2022-05-08 DIAGNOSIS — J811 Chronic pulmonary edema: Secondary | ICD-10-CM | POA: Diagnosis not present

## 2022-05-08 NOTE — Patient Instructions (Addendum)
-   PFTs (breathing tests) you will be called to schedule  - see you in 6 weeks to review results  - labs today, CXR today and we'll see if we need CT Chest

## 2022-05-09 LAB — CBC WITH DIFFERENTIAL/PLATELET
Basophils Absolute: 0.1 10*3/uL (ref 0.0–0.1)
Basophils Relative: 1.1 % (ref 0.0–3.0)
Eosinophils Absolute: 0.3 10*3/uL (ref 0.0–0.7)
Eosinophils Relative: 3.6 % (ref 0.0–5.0)
HCT: 40.6 % (ref 36.0–46.0)
Hemoglobin: 13.4 g/dL (ref 12.0–15.0)
Lymphocytes Relative: 29.9 % (ref 12.0–46.0)
Lymphs Abs: 2.2 10*3/uL (ref 0.7–4.0)
MCHC: 32.9 g/dL (ref 30.0–36.0)
MCV: 87 fl (ref 78.0–100.0)
Monocytes Absolute: 0.7 10*3/uL (ref 0.1–1.0)
Monocytes Relative: 9.9 % (ref 3.0–12.0)
Neutro Abs: 4.1 10*3/uL (ref 1.4–7.7)
Neutrophils Relative %: 55.5 % (ref 43.0–77.0)
Platelets: 235 10*3/uL (ref 150.0–400.0)
RBC: 4.67 Mil/uL (ref 3.87–5.11)
RDW: 14.6 % (ref 11.5–15.5)
WBC: 7.3 10*3/uL (ref 4.0–10.5)

## 2022-05-09 LAB — BASIC METABOLIC PANEL
BUN: 16 mg/dL (ref 6–23)
CO2: 27 mEq/L (ref 19–32)
Calcium: 9.4 mg/dL (ref 8.4–10.5)
Chloride: 105 mEq/L (ref 96–112)
Creatinine, Ser: 1.05 mg/dL (ref 0.40–1.20)
GFR: 50.17 mL/min — ABNORMAL LOW (ref >60.00)
Glucose, Bld: 80 mg/dL (ref 70–99)
Potassium: 4.1 mEq/L (ref 3.5–5.1)
Sodium: 141 mEq/L (ref 135–145)

## 2022-05-09 LAB — BRAIN NATRIURETIC PEPTIDE: Pro B Natriuretic peptide (BNP): 242 pg/mL — ABNORMAL HIGH (ref 0.0–100.0)

## 2022-05-10 LAB — IGE: IgE (Immunoglobulin E), Serum: 522 kU/L — ABNORMAL HIGH (ref <=114)

## 2022-05-12 ENCOUNTER — Encounter: Payer: Self-pay | Admitting: Student

## 2022-05-12 NOTE — Telephone Encounter (Signed)
Dr. Meier, please see mychart message sent by pt and advise. 

## 2022-05-15 ENCOUNTER — Telehealth: Payer: Self-pay | Admitting: Student

## 2022-05-15 ENCOUNTER — Telehealth: Payer: Self-pay | Admitting: Cardiology

## 2022-05-15 DIAGNOSIS — R0602 Shortness of breath: Secondary | ICD-10-CM

## 2022-05-15 MED ORDER — FUROSEMIDE 20 MG PO TABS: 20.0000 mg | ORAL_TABLET | Freq: Every day | ORAL | 3 refills | Status: DC | PRN

## 2022-05-15 NOTE — Telephone Encounter (Signed)
Called today and discussed elevated but stable (relative to prior) BNP which may be related to her diastolic dysfunction/MR, IgE - elevated and indicates there may be role for AIT or anti-IgE therapy if has asthma and proves difficult to control and eosinophil count of 300 - if has asthma and proves difficult to control she would be candidate for antibodies working on Th2 pathway.  All questions addressed.

## 2022-05-15 NOTE — Telephone Encounter (Addendum)
Patient reports SOB that has increased in intensity. At the grocery store, she has to stop and lean on the cart to catch her breath. When she talks, she has to stop and catch her breath. I did not notice SOB over the phone. She also reports back pain between shoulder blades and heaviness in chest. She is taking hydralazine bid, not tid. Her bp yesterday was 134/61,p 56. She is taking sotalol bid as per AVS, but her prescription bottle reads tid. She saw pulmonologist on the 15th. She had blood work and CXR. Patient's concerns are SOB, how she should take hydralazine and sotalol, and she would like Dr. Jens Som to review 5/15 labs and CXR. Please advise.

## 2022-05-15 NOTE — Telephone Encounter (Signed)
Patient is returning call. Transferred to RN.  

## 2022-05-15 NOTE — Telephone Encounter (Signed)
May 15, 2022 Omar Person, MD      1:23 PM Note Called today and discussed elevated but stable (relative to prior) BNP which may be related to her diastolic dysfunction/MR, IgE - elevated and indicates there may be role for AIT or anti-IgE therapy if has asthma and proves difficult to control and eosinophil count of 300 - if has asthma and proves difficult to control she would be candidate for antibodies working on Th2 pathway.   All questions addressed.

## 2022-05-15 NOTE — Telephone Encounter (Signed)
Spoke with pt, Aware of dr crenshaw's recommendations.  ?New script sent to the pharmacy  ?Lab orders mailed to the pt  ?

## 2022-05-15 NOTE — Telephone Encounter (Signed)
LMTCB

## 2022-05-15 NOTE — Addendum Note (Signed)
Addended by: Freddi Starr on: 05/15/2022 03:12 PM   Modules accepted: Orders

## 2022-05-15 NOTE — Telephone Encounter (Signed)
Pt c/o Shortness Of Breath: STAT if SOB developed within the last 24 hours or pt is noticeably SOB on the phone  1. Are you currently SOB (can you hear that pt is SOB on the phone)? No  2. How long have you been experiencing SOB? A month   3. Are you SOB when sitting or when up moving around? Moving around   4. Are you currently experiencing any other symptoms? Back pain between shoulder blades and in chest. Says when she pushes on sternum, it aches. Also extreme fatigue  Had results from her pulmonologist, but isn't getting a call back and says that she is worried because her symptoms are not getting better.

## 2022-05-20 ENCOUNTER — Other Ambulatory Visit: Payer: Self-pay | Admitting: Cardiology

## 2022-05-20 ENCOUNTER — Other Ambulatory Visit: Payer: Self-pay | Admitting: Family Medicine

## 2022-05-20 DIAGNOSIS — K2100 Gastro-esophageal reflux disease with esophagitis, without bleeding: Secondary | ICD-10-CM

## 2022-05-30 DIAGNOSIS — R0602 Shortness of breath: Secondary | ICD-10-CM | POA: Diagnosis not present

## 2022-05-31 LAB — BASIC METABOLIC PANEL
BUN/Creatinine Ratio: 12 (ref 12–28)
BUN: 12 mg/dL (ref 8–27)
CO2: 22 mmol/L (ref 20–29)
Calcium: 9.3 mg/dL (ref 8.7–10.3)
Chloride: 103 mmol/L (ref 96–106)
Creatinine, Ser: 0.97 mg/dL (ref 0.57–1.00)
Glucose: 91 mg/dL (ref 70–99)
Potassium: 4.4 mmol/L (ref 3.5–5.2)
Sodium: 141 mmol/L (ref 134–144)
eGFR: 59 mL/min/{1.73_m2} — ABNORMAL LOW (ref >59)

## 2022-06-03 ENCOUNTER — Ambulatory Visit
Admission: RE | Admit: 2022-06-03 | Discharge: 2022-06-03 | Disposition: A | Discharge status: Home / Self Care | Payer: Medicare HMO | Source: Ambulatory Visit | Attending: Student | Admitting: Student

## 2022-06-03 DIAGNOSIS — K449 Diaphragmatic hernia without obstruction or gangrene: Secondary | ICD-10-CM | POA: Diagnosis not present

## 2022-06-03 DIAGNOSIS — J84112 Idiopathic pulmonary fibrosis: Secondary | ICD-10-CM | POA: Diagnosis not present

## 2022-06-03 DIAGNOSIS — R0609 Other forms of dyspnea: Secondary | ICD-10-CM

## 2022-06-03 DIAGNOSIS — R918 Other nonspecific abnormal finding of lung field: Secondary | ICD-10-CM | POA: Diagnosis not present

## 2022-06-03 DIAGNOSIS — I700 Atherosclerosis of aorta: Secondary | ICD-10-CM | POA: Diagnosis not present

## 2022-07-09 NOTE — Progress Notes (Signed)
Synopsis: Referred for COPD by Agapito Games, *  Subjective:   PATIENT ID: Rebekah Harrell GENDER: female DOB: 1941/08/10, MRN: [id number]  Chief Complaint  Patient presents with   Follow-up    PFT's done today. Breathing has been progressively worse since her last visit. She states she notices wheezing with any exertion.    80yF with history of allergy, asthma, COPD, AF, CKD, GERD, hiatal hernia, +ANA with recurrent angioedema of unclear etiology on plaquenil followed by [facility], referred for COPD and worsening DOE. She never had covid-19 infection.  She says she's had breathing problems ever since she was little, attributes it at least in part to secondhand smoke exposure in past.   Now has DOE where she has stop speaking to catch her breath if she's in conversation however she is able to relate a good history today without interruption. DOE to 100 feet. She has no cough. No orthopnea. Weight has gone up recently. She was on course of prednisone for 10 days over January prescribed by rheumatologist but she noticed no effect on her breathing. Very rare sensation of dysphagia - doesn't frequently get choked on food/liquid. She snores once in a while. No PND. She is sleepy during the day but attributes it to her antihistamine regimen.   She didn't tolerate wixela well due to the dry powder formulation causing cough.   She sees allergist at [facility] in [place] sees Dr. Lacretia Nicks. On a bunch of antihistamines due to recurrent angioedema of unclear etiology. Has not been on AIT  No family history of lung disease  She worked staging houses in past, Oceanographer in past. Lots of secondhand smoke exposure but she never smoked herself, no vaping/MJ. Has a dog and cat at home.   Interval HPI: HRCT showed subtle subpleural reticular opacities, air trapping, RLL 14mm ggo, calcified LNs  PFTs today with obstruction and borderline bronchodilator response.  She has  had worsening DOE since last visit. She worries about deconditioning due to lower back pain. Her weight has not changed significantly since last visit. No cough.   Otherwise pertinent review of systems is negative.  Past Medical History:  Diagnosis Date   Allergy    Aortic atherosclerosis (HCC)    Arthritis    "lower back; knees" (11/26/2016)   Asthma    Chronic atrial fibrillation (HCC)    CKD (chronic kidney disease), stage III (HCC)    patient unaware of this on 11/26/2016   Complication of anesthesia    "had trouble waking me up in the late 1990's after gallbladder OR"   COPD (chronic obstructive pulmonary disease) (HCC)    GERD (gastroesophageal reflux disease)    Heart murmur    "dx'd when I was a little kid"   History of hiatal hernia    Hyperlipidemia    Hypertension    Hypoglycemia      Family History  Problem Relation Age of Onset   CAD Mother        HEART ATTACK   Colon cancer Father        KIDNEY FAILURE/COLON CANCER   Atrial fibrillation Brother    Healthy Brother    Heart failure Maternal Grandmother    Kidney failure Maternal Grandfather    Emphysema Maternal Grandfather    Heart attack Paternal Grandmother      Past Surgical History:  Procedure Laterality Date   CHOLECYSTECTOMY OPEN  1990s   TONSILLECTOMY     TUBAL LIGATION  Social History   Socioeconomic History   Marital status: Divorced    Spouse name: Not on file   Number of children: 3   Years of education: 14   Highest education level: Associate degree: academic program  Occupational History   Occupation: stages houses for sale    Comment: self employed   Occupation: Retired.  Tobacco Use   Smoking status: Never   Smokeless tobacco: Never  Vaping Use   Vaping Use: Never used  Substance and Sexual Activity   Alcohol use: Not Currently   Drug use: No   Sexual activity: Not Currently  Other Topics Concern   Not on file  Social History Narrative   Lives alone. One son lives  close by incase she needs anything. She enjoys crafting and decorating.    Social Determinants of Health   Financial Resource Strain: Low Risk  (01/15/2022)   Overall Financial Resource Strain (CARDIA)    Difficulty of Paying Living Expenses: Not hard at all  Food Insecurity: No Food Insecurity (01/15/2022)   Hunger Vital Sign    Worried About Running Out of Food in the Last Year: Never true    Ran Out of Food in the Last Year: Never true  Transportation Needs: No Transportation Needs (01/15/2022)   PRAPARE - Hydrologist (Medical): No    Lack of Transportation (Non-Medical): No  Physical Activity: Inactive (01/15/2022)   Exercise Vital Sign    Days of Exercise per Week: 0 days    Minutes of Exercise per Session: 0 min  Stress: Stress Concern Present (01/15/2022)   [place]    Feeling of Stress : Rather much  Social Connections: Moderately Isolated (01/15/2022)   Social Connection and Isolation Panel [NHANES]    Frequency of Communication with Friends and Family: More than three times a week    Frequency of Social Gatherings with Friends and Family: Once a week    Attends Religious Services: Never    Marine scientist or Organizations: Yes    Attends Music therapist: More than 4 times per year    Marital Status: Divorced  Intimate Partner Violence: Not At Risk (01/15/2022)   Humiliation, Afraid, Rape, and Kick questionnaire    Fear of Current or Ex-Partner: No    Emotionally Abused: No    Physically Abused: No    Sexually Abused: No     Allergies  Allergen Reactions   Ace Inhibitors Swelling   Tramadol Other (See Comments)    Night sweats, bad dreams   Celecoxib Other (See Comments)    REACTION: hair loss   Cetirizine Hcl Other (See Comments)    REACTION: fatigue   Penicillins Other (See Comments)    Unknown   Sulfonamide Derivatives Rash     Outpatient  Medications Prior to Visit  Medication Sig Dispense Refill   ALLERGY RELIEF 180 MG tablet TAKE 1 TABLET EVERY DAY 90 tablet 1   amLODipine (NORVASC) 10 MG tablet TAKE 1 TABLET EVERY DAY (MUST KEEP SCHEDULED APPT) 90 tablet 3   CALCIUM PO Take 1 tablet by mouth at bedtime.     Cholecalciferol 125 MCG (5000 UT) capsule Take 5,000 Units by mouth daily.     diltiazem (CARDIZEM) 30 MG tablet Take 1 tablet every 4 hours AS NEEDED for heart rate >100 as long as top bp >100 45 tablet 1   famotidine (PEPCID) 20 MG tablet Take 1 tablet (  20 mg total) by mouth at bedtime. 90 tablet 3   furosemide (LASIX) 20 MG tablet Take 1 tablet (20 mg total) by mouth daily as needed. 90 tablet 3   hydrALAZINE (APRESOLINE) 50 MG tablet TAKE 1 TABLET(50 MG) BY MOUTH THREE TIMES DAILY 90 tablet 3   hydroxychloroquine (PLAQUENIL) 200 MG tablet Take 200 mg by mouth daily.     Magnesium 200 MG TABS Take 1 tablet (200 mg total) by mouth daily. 60 each    montelukast (SINGULAIR) 10 MG tablet TAKE 1 TABLET AT BEDTIME 90 tablet 1   Multiple Vitamins-Minerals (MULTIVITAMIN PO) Take 1 tablet by mouth at bedtime.     omeprazole (PRILOSEC) 20 MG capsule TAKE 1 CAPSULE EVERY DAY 90 capsule 3   potassium chloride SA (KLOR-CON M) 20 MEQ tablet TAKE 1 TABLET EVERY DAY 90 tablet 0   simvastatin (ZOCOR) 20 MG tablet Take 1 tablet (20 mg total) by mouth every evening. Must schedule yearly exam and labs for refills 90 tablet 3   sotalol (BETAPACE) 80 MG tablet Take 1 tablet (80 mg total) by mouth 3 (three) times daily. 270 tablet 3   Zinc 50 MG TABS Take 1 tablet by mouth daily.     No facility-administered medications prior to visit.       Objective:   Physical Exam:  General appearance: 81 y.o., female, NAD, conversant  Eyes: anicteric sclerae; PERRL, tracking appropriately HENT: NCAT; MMM Neck: Trachea midline; no lymphadenopathy, no JVD Lungs: CTAB, no crackles, no wheeze, with normal respiratory effort CV: RRR, no murmur   Abdomen: Soft, non-tender; non-distended, BS present  Extremities: No peripheral edema, warm Skin: Normal turgor and texture; no rash Psych: Appropriate affect Neuro: Alert and oriented to person and place, no focal deficit     Vitals:   07/10/22 1109  BP: 114/66  Pulse: (!) 58  Temp: 97.9 F (36.6 C)  TempSrc: Oral  SpO2: 93%  Weight: 180 lb (81.6 kg)  Height: 5\' 3"  (1.6 m)    93% on RA BMI Readings from Last 3 Encounters:  07/10/22 31.89 kg/m  05/08/22 31.14 kg/m  02/24/22 31.00 kg/m   Wt Readings from Last 3 Encounters:  07/10/22 180 lb (81.6 kg)  05/08/22 178 lb 9.6 oz (81 kg)  02/24/22 175 lb (79.4 kg)     CBC    Component Value Date/Time   WBC 7.3 05/08/2022 1537   RBC 4.67 05/08/2022 1537   HGB 13.4 05/08/2022 1537   HGB 12.9 11/18/2017 1205   HCT 40.6 05/08/2022 1537   HCT 38.2 11/18/2017 1205   PLT 235.0 05/08/2022 1537   PLT 306 11/18/2017 1205   MCV 87.0 05/08/2022 1537   MCV 84 11/18/2017 1205   MCH 28.1 05/03/2021 1231   MCHC 32.9 05/08/2022 1537   RDW 14.6 05/08/2022 1537   RDW 14.2 11/18/2017 1205   LYMPHSABS 2.2 05/08/2022 1537   MONOABS 0.7 05/08/2022 1537   EOSABS 0.3 05/08/2022 1537   BASOSABS 0.1 05/08/2022 1537    Eos 240  Chest Imaging: CXR 04/06/20 reviewed by me with prominent R hilum  HRCT Chest 6/14 reviewed by me with subtle subpleural reticular opacities, air trapping, RLL 96mm ggo, calcified LNs  Pulmonary Functions Testing Results:    Latest Ref Rng & Units 07/10/2022    9:53 AM  PFT Results  FVC-Pre L 1.82  P  FVC-Predicted Pre % 72  P  FVC-Post L 1.98  P  FVC-Predicted Post % 79  P  Pre  FEV1/FVC % % 68  P  Post FEV1/FCV % % 70  P  FEV1-Pre L 1.23  P  FEV1-Predicted Pre % 66  P  FEV1-Post L 1.40  P  DLCO uncorrected ml/min/mmHg 13.01  P  DLCO UNC% % 71  P  DLCO corrected ml/min/mmHg 13.01  P  DLCO COR %Predicted % 71  P  DLVA Predicted % 74  P  TLC L 5.56  P  TLC % Predicted % 113  P  RV % Predicted %  149  P    P Preliminary result   Mild obstruction, borderline bronchodilator response, air trapping, mildly reduced diffusing capacity   Echocardiogram:   TTE 04/11/20: 1. Left ventricular ejection fraction, by estimation, is 55 to 60%. The  left ventricle has normal function. The left ventricle has no regional  wall motion abnormalities. Left ventricular diastolic parameters are  consistent with Grade II diastolic  dysfunction (pseudonormalization).   2. Right ventricular systolic function is normal. The right ventricular  size is normal. There is mildly elevated pulmonary artery systolic  pressure.   3. Left atrial size was moderately dilated.   4. The mitral valve is normal in structure. Mild to moderate mitral valve  regurgitation. No evidence of mitral stenosis.   5. The aortic valve is normal in structure. Aortic valve regurgitation is  not visualized. No aortic stenosis is present.   6. The inferior vena cava is normal in size with greater than 50%  respiratory variability, suggesting right atrial pressure of 3 mmHg.   7. Compared to echocardiogram from 2017 MR from mild now is mild to  moderate, LA size increased to moderate enlargement from mild. Disatolic  dysfunction.       Assessment & Plan:   # DOE # Asthma-copd overlap Does have undertreated ACOS. Alternatively could have early ILD (vs sequelae of prior indolent infx process like endemic fungal infx, etc given calcified mediastinal LAD or less likely sarcoid - neither of these look active to me on imaging), diastolic dysfunction (but euvolemic on exam today), angina, deconditioning.   # Recurrent angioedema, unclear etiology # Elevated ANA  # RLL 64mm ggo   Plan: - start non DPI LABA/ICS will confirm least expensive option with pharmacy. Has cough with DPI inhalers.  - albuterol prn - CT Chest in 6 months - RTC 7 months to review CT Chest      Maryjane Hurter, MD [facility] 07/10/2022 11:26 AM

## 2022-07-10 ENCOUNTER — Ambulatory Visit (INDEPENDENT_AMBULATORY_CARE_PROVIDER_SITE_OTHER): Payer: Medicare HMO | Admitting: Student

## 2022-07-10 ENCOUNTER — Encounter: Payer: Self-pay | Admitting: Student

## 2022-07-10 ENCOUNTER — Ambulatory Visit: Payer: Medicare HMO | Admitting: Student

## 2022-07-10 ENCOUNTER — Telehealth: Payer: Self-pay | Admitting: Student

## 2022-07-10 VITALS — BP 114/66 | HR 58 | Temp 97.9°F | Ht 63.0 in | Wt 180.0 lb

## 2022-07-10 DIAGNOSIS — R911 Solitary pulmonary nodule: Secondary | ICD-10-CM | POA: Diagnosis not present

## 2022-07-10 DIAGNOSIS — J449 Chronic obstructive pulmonary disease, unspecified: Secondary | ICD-10-CM

## 2022-07-10 LAB — PULMONARY FUNCTION TEST
DL/VA % pred: 74 %
DL/VA: 3.05 ml/min/mmHg/L
DLCO cor % pred: 71 %
DLCO cor: 13.01 ml/min/mmHg
DLCO unc % pred: 71 %
DLCO unc: 13.01 ml/min/mmHg
FEF 25-75 Post: 1.11 L/sec
FEF 25-75 Pre: 0.73 L/sec
FEF2575-%Change-Post: 52 %
FEF2575-%Pred-Post: 82 %
FEF2575-%Pred-Pre: 54 %
FEV1-%Change-Post: 13 %
FEV1-%Pred-Post: 75 %
FEV1-%Pred-Pre: 66 %
FEV1-Post: 1.4 L
FEV1-Pre: 1.23 L
FEV1FVC-%Change-Post: 3 %
FEV1FVC-%Pred-Pre: 91 %
FEV6-%Change-Post: 8 %
FEV6-%Pred-Post: 83 %
FEV6-%Pred-Pre: 77 %
FEV6-Post: 1.97 L
FEV6-Pre: 1.82 L
FEV6FVC-%Change-Post: 0 %
FEV6FVC-%Pred-Post: 105 %
FEV6FVC-%Pred-Pre: 105 %
FVC-%Change-Post: 9 %
FVC-%Pred-Post: 79 %
FVC-%Pred-Pre: 72 %
FVC-Post: 1.98 L
FVC-Pre: 1.82 L
Post FEV1/FVC ratio: 70 %
Post FEV6/FVC ratio: 99 %
Pre FEV1/FVC ratio: 68 %
Pre FEV6/FVC Ratio: 100 %
RV % pred: 149 %
RV: 3.5 L
TLC % pred: 113 %
TLC: 5.56 L

## 2022-07-10 NOTE — Telephone Encounter (Signed)
What is least expensive non DIP LABA/ICS inhaler for her? She has asthma-copd overlap and DPI gives her cough  Thanks!

## 2022-07-10 NOTE — Progress Notes (Signed)
Full PFT Performed Today  

## 2022-07-10 NOTE — Patient Instructions (Signed)
Full PFT Performed Today  

## 2022-07-10 NOTE — Patient Instructions (Addendum)
-   I'll let you know once I hear from pharmacy about least expensive option for inhaler - this will be an everyday, controller inhaler - albuterol is your rescue - use as needed - ct chest in 6 months - [facility] - see you after the CT Chest!

## 2022-07-11 ENCOUNTER — Encounter: Payer: Self-pay | Admitting: Student

## 2022-07-14 ENCOUNTER — Other Ambulatory Visit (HOSPITAL_COMMUNITY): Payer: Self-pay

## 2022-07-15 NOTE — Telephone Encounter (Signed)
Just to double check, the $45 copay is for a 30 day supply, correct?

## 2022-07-16 ENCOUNTER — Other Ambulatory Visit (HOSPITAL_COMMUNITY): Payer: Self-pay

## 2022-07-18 NOTE — Telephone Encounter (Signed)
[place] mailed to pt. Nothing further needed at this time.

## 2022-08-12 ENCOUNTER — Telehealth: Payer: Self-pay | Admitting: Student

## 2022-08-12 NOTE — Telephone Encounter (Signed)
Patient mailed back [place] forms for Symbicort- forms placed in Dr. Lind Guest box.

## 2022-08-12 NOTE — Telephone Encounter (Signed)
Forms given to Dr. Thora Lance to sign  Nothing further

## 2022-08-27 ENCOUNTER — Ambulatory Visit (INDEPENDENT_AMBULATORY_CARE_PROVIDER_SITE_OTHER): Payer: Medicare HMO | Admitting: Family Medicine

## 2022-08-27 ENCOUNTER — Encounter: Payer: Self-pay | Admitting: Family Medicine

## 2022-08-27 VITALS — BP 116/47 | HR 46 | Temp 98.0°F | Ht 63.0 in | Wt 177.0 lb

## 2022-08-27 DIAGNOSIS — M329 Systemic lupus erythematosus, unspecified: Secondary | ICD-10-CM

## 2022-08-27 DIAGNOSIS — M791 Myalgia, unspecified site: Secondary | ICD-10-CM

## 2022-08-27 DIAGNOSIS — I10 Essential (primary) hypertension: Secondary | ICD-10-CM

## 2022-08-27 DIAGNOSIS — N1831 Chronic kidney disease, stage 3a: Secondary | ICD-10-CM | POA: Diagnosis not present

## 2022-08-27 DIAGNOSIS — H9319 Tinnitus, unspecified ear: Secondary | ICD-10-CM | POA: Diagnosis not present

## 2022-08-27 DIAGNOSIS — J019 Acute sinusitis, unspecified: Secondary | ICD-10-CM

## 2022-08-27 DIAGNOSIS — J449 Chronic obstructive pulmonary disease, unspecified: Secondary | ICD-10-CM

## 2022-08-27 DIAGNOSIS — R0602 Shortness of breath: Secondary | ICD-10-CM | POA: Diagnosis not present

## 2022-08-27 MED ORDER — AMOXICILLIN-POT CLAVULANATE 875-125 MG PO TABS: 1.0000 | ORAL_TABLET | Freq: Two times a day (BID) | ORAL | 0 refills | Status: DC

## 2022-08-27 NOTE — Assessment & Plan Note (Signed)
Trying to get her Symbicort covered.

## 2022-08-27 NOTE — Assessment & Plan Note (Signed)
To recheck renal function and continue to follow every 6 months.

## 2022-08-27 NOTE — Patient Instructions (Signed)
We will do some labs today to evaluate the muscle aches that she been having and weakness and tiredness. Want you to try to hold your statin and not take it for about 2 to 3 weeks and see if you notice any improvement in your symptoms.  If you do not, then please restart the medication.  If you do feel better off of the for the medicine, then please give [place] a call and let me know and I can switch you to something different.

## 2022-08-27 NOTE — Assessment & Plan Note (Signed)
Blood pressure is a little bit on the lower side today including the diastolic.  She says normally she is getting a little higher pressures at home.  We will do orthostatics today before she leaves.

## 2022-08-27 NOTE — Assessment & Plan Note (Signed)
Following with rheumatology.  They currently have her on Plaquenil.  Oertli be could be contributing to the fatigue and muscle aches.

## 2022-08-27 NOTE — Progress Notes (Signed)
Established Patient Office Visit  Subjective   Patient ID: Rebekah Harrell, female    DOB: [birth year]  Age: 81 y.o. MRN: [id number]  Chief Complaint  Patient presents with   Follow-up    HPI  81-month follow-up for COPD-pulmonology is trying to get her on Symbicort.  But its been quite expensive with her insurance to try to get it approved through [employer].  In the meantime she does have her albuterol to use for rescue only if needed.  Hypertension- Pt denies chest pain, SOB, dizziness, or heart palpitations.  Taking meds as directed w/o problems.  Denies medication side effects.  Reports home BPs running in the 130/60s.    She is concerned because she is having a lot of just extreme fatigue and tiredness.  She feels like she has some muscle weakness as well.  She was diagnosed with lupus and is now on hydroxychloroquine.  The last 2 weeks she has also had a lot of sinus pressure in her left sinus.  She says it so clogged she cannot breathe out of that side sometimes.  She has a lot of pressure in her head occasional sore throat.  No fevers chills or sweats.  She is also had decreased smell and taste for the last 2 weeks.  C/o of constant ringing in her left ear after her head injury from a year ago.    ROS    Objective:     BP (!) 116/47 (BP Location: Left Arm, Patient Position: Sitting, Cuff Size: Normal)   Pulse (!) 46   Temp 98 F (36.7 C) (Oral)   Ht 5\' 3"  (1.6 m)   Wt 177 lb 0.6 oz (80.3 kg)   SpO2 96%   BMI 31.36 kg/m    Physical Exam Vitals and nursing note reviewed.  Constitutional:      Appearance: She is well-developed.  HENT:     Head: Normocephalic and atraumatic.     Right Ear: Tympanic membrane, ear canal and external ear normal.     Left Ear: Tympanic membrane, ear canal and external ear normal.     Nose: Nose normal.     Mouth/Throat:     Pharynx: Oropharynx is clear.  Eyes:     Conjunctiva/sclera: Conjunctivae normal.     Pupils: Pupils  are equal, round, and reactive to light.  Neck:     Thyroid: No thyromegaly.  Cardiovascular:     Rate and Rhythm: Normal rate and regular rhythm.     Heart sounds: Normal heart sounds.  Pulmonary:     Effort: Pulmonary effort is normal.     Breath sounds: Normal breath sounds. No wheezing.  Musculoskeletal:     Cervical back: Neck supple. No tenderness.  Lymphadenopathy:     Cervical: No cervical adenopathy.  Skin:    General: Skin is warm and dry.  Neurological:     Mental Status: She is alert and oriented to person, place, and time.  Psychiatric:        Behavior: Behavior normal.      No results found for any visits on 08/27/22.    The ASCVD Risk score (Arnett DK, et al., 2019) failed to calculate for the following reasons:   The 2019 ASCVD risk score is only valid for ages 69 to 72    Assessment & Plan:   Problem List Items Addressed This Visit       Cardiovascular and Mediastinum   HYPERTENSION, BENIGN ESSENTIAL    Blood pressure  is a little bit on the lower side today including the diastolic.  She says normally she is getting a little higher pressures at home.  We will do orthostatics today before she leaves.      Relevant Orders   CK (Creatine Kinase)   TSH + free T4   B Nat Peptide   COMPLETE METABOLIC PANEL WITH GFR   Lipid Panel w/reflex Direct LDL     Respiratory   COPD (chronic obstructive pulmonary disease) (HCC) - Primary    Trying to get her Symbicort covered.      Relevant Orders   CK (Creatine Kinase)   TSH + free T4   B Nat Peptide   COMPLETE METABOLIC PANEL WITH GFR   Lipid Panel w/reflex Direct LDL     Genitourinary   CKD (chronic kidney disease) stage 3, GFR 30-59 ml/min (HCC)    To recheck renal function and continue to follow every 6 months.      Relevant Orders   CK (Creatine Kinase)   TSH + free T4   B Nat Peptide   COMPLETE METABOLIC PANEL WITH GFR   Lipid Panel w/reflex Direct LDL     Other   Tinnitus     Unfortunately its been a year since her head injury so I suspect the tinnitus will probably be chronic.      Lupus [facility])    Following with rheumatology.  They currently have her on Plaquenil.  Oertli be could be contributing to the fatigue and muscle aches.      Other Visit Diagnoses     Acute non-recurrent sinusitis, unspecified location       Relevant Medications   amoxicillin-clavulanate (AUGMENTIN) 875-125 MG tablet   Myalgia           Acute sinusitis-we will treat with Augmentin please call if not better in 1 week.  Myalgias and fatigue-we did discuss that it may be a combination of her COPD, atrial fibrillation and lupus that is just making it more difficult for her to be physically active.  Though we also discussed possibly holding the statin for a couple of weeks to see if she notes a difference and also checking a CK level.  Return in about 4 months (around 12/29/2022) for Hypertension, lungs.    Nani Gasser, MD

## 2022-08-27 NOTE — Assessment & Plan Note (Signed)
Unfortunately its been a year since her head injury so I suspect the tinnitus will probably be chronic.

## 2022-08-28 LAB — COMPLETE METABOLIC PANEL WITH GFR
AG Ratio: 1.4 (calc) (ref 1.0–2.5)
ALT: 11 U/L (ref 6–29)
AST: 16 U/L (ref 10–35)
Albumin: 4.1 g/dL (ref 3.6–5.1)
Alkaline phosphatase (APISO): 84 U/L (ref 37–153)
BUN/Creatinine Ratio: 13 (calc) (ref 6–22)
BUN: 16 mg/dL (ref 7–25)
CO2: 27 mmol/L (ref 20–32)
Calcium: 9.6 mg/dL (ref 8.6–10.4)
Chloride: 105 mmol/L (ref 98–110)
Creat: 1.19 mg/dL — ABNORMAL HIGH (ref 0.60–0.95)
Globulin: 3 g/dL (calc) (ref 1.9–3.7)
Glucose, Bld: 94 mg/dL (ref 65–139)
Potassium: 5.3 mmol/L (ref 3.5–5.3)
Sodium: 141 mmol/L (ref 135–146)
Total Bilirubin: 0.5 mg/dL (ref 0.2–1.2)
Total Protein: 7.1 g/dL (ref 6.1–8.1)
eGFR: 46 mL/min/{1.73_m2} — ABNORMAL LOW (ref >=60)

## 2022-08-28 LAB — BRAIN NATRIURETIC PEPTIDE: Brain Natriuretic Peptide: 144 pg/mL — ABNORMAL HIGH (ref <100)

## 2022-08-28 LAB — LIPID PANEL W/REFLEX DIRECT LDL
Cholesterol: 154 mg/dL (ref <200)
HDL: 47 mg/dL — ABNORMAL LOW (ref >=50)
LDL Cholesterol (Calc): 76 mg/dL (calc)
Non-HDL Cholesterol (Calc): 107 mg/dL (calc) (ref <130)
Total CHOL/HDL Ratio: 3.3 (calc) (ref <5.0)
Triglycerides: 215 mg/dL — ABNORMAL HIGH (ref <150)

## 2022-08-28 LAB — CK: Total CK: 34 U/L (ref 29–143)

## 2022-08-28 LAB — TSH+FREE T4: TSH W/REFLEX TO FT4: 3.48 mIU/L (ref 0.40–4.50)

## 2022-08-28 NOTE — Progress Notes (Signed)
Rebekah Harrell, BNP which measures that excess volume overload is slightly elevated, but okay.  We do not need to do anything different based on that.  Kidney function is stable.  Triglycerides are mildly elevated but better than last year so great work for bringing those down.  DL looks a little bit better as well goal is to get it under 70 its at 76 currently.  Continue to work on [employer] and regular exercise.  He looks good and no sign of excess muscle breakdown.  You hold your cholesterol pill for 2 to 3 weeks please let me know if you are feeling better.

## 2022-09-17 ENCOUNTER — Telehealth: Payer: Self-pay | Admitting: *Deleted

## 2022-09-17 DIAGNOSIS — J329 Chronic sinusitis, unspecified: Secondary | ICD-10-CM

## 2022-09-17 MED ORDER — ROSUVASTATIN CALCIUM 10 MG PO TABS: 10.0000 mg | ORAL_TABLET | Freq: Every day | ORAL | 3 refills | Status: DC

## 2022-09-17 NOTE — Telephone Encounter (Signed)
Pt called and advised of recommendations. No further questions.

## 2022-09-17 NOTE — Telephone Encounter (Signed)
Okay to place referral for recurrent sinusitis.  Did send over prescription for generic Crestor.  Have her just start with taking it 2 or 3 times a week and see if she is able to tolerate it for couple weeks if she is then she can try increasing to daily.  Meds ordered this encounter  Medications   rosuvastatin (CRESTOR) 10 MG tablet    Sig: Take 1 tablet (10 mg total) by mouth daily.    Dispense:  90 tablet    Refill:  3

## 2022-09-17 NOTE — Telephone Encounter (Signed)
Pt called and stated that she was seen and treated for Acute sinusitis. She was given Augmentin. She stated that she finished the ABX but she has IBS and it caused issues with that. She states that she is still experiencing pain and pressure on the L side and if she blows her nose she gets a yellow green mucus. She asked about being referred to ENT. I told her that we can place the referral for her she stated that she is fine with [place] or [place]. Referral placed.   She also stated that she had been holding the Simvastatin for the past 3 weeks and will need to have this replaced with another statin. She doesn't have the muscle weakness, and feels that this has gotten better. She stated that Dr. Madilyn Fireman will need to choose another statin for her . She asked that for now she would like a 30 day to be sent to [facility] in [place] and if this works for her then it can be sent to [place] there after.

## 2022-09-24 ENCOUNTER — Other Ambulatory Visit: Payer: Medicare HMO

## 2022-09-30 ENCOUNTER — Ambulatory Visit: Payer: Medicare HMO | Admitting: Student

## 2022-10-07 ENCOUNTER — Other Ambulatory Visit: Payer: Self-pay | Admitting: Family Medicine

## 2022-10-16 DIAGNOSIS — J329 Chronic sinusitis, unspecified: Secondary | ICD-10-CM | POA: Diagnosis not present

## 2022-10-31 ENCOUNTER — Other Ambulatory Visit: Payer: Self-pay | Admitting: *Deleted

## 2022-10-31 ENCOUNTER — Telehealth: Payer: Self-pay | Admitting: Cardiology

## 2022-10-31 MED ORDER — SYMBICORT 160-4.5 MCG/ACT IN AERO: 2.0000 | INHALATION_SPRAY | Freq: Two times a day (BID) | RESPIRATORY_TRACT | 3 refills | Status: DC

## 2022-10-31 MED ORDER — APIXABAN 5 MG PO TABS: 5.0000 mg | ORAL_TABLET | Freq: Two times a day (BID) | ORAL | 3 refills | Status: DC

## 2022-10-31 NOTE — Telephone Encounter (Signed)
Spoke with pt, per pharmacy direction,eliquis 5 mg twice daily sent to [facility].

## 2022-10-31 NOTE — Telephone Encounter (Signed)
Pt c/o medication issue:  1. Name of Medication: Eliquis   2. How are you currently taking this medication (dosage and times per day)?   3. Are you having a reaction (difficulty breathing--STAT)?   4. What is your medication issue? Pt would like to restart eliquis because her insurance Francine [employer]ven has found a way to cut the cost down for her and she would like it sent to [facility]

## 2022-10-31 NOTE — Telephone Encounter (Signed)
Patient's Eliquis dose will be 5mg  po BID

## 2022-10-31 NOTE — Telephone Encounter (Signed)
Will forward to pharmacist to help with dosing.

## 2022-11-20 ENCOUNTER — Other Ambulatory Visit: Payer: Self-pay | Admitting: Family Medicine

## 2022-11-26 ENCOUNTER — Other Ambulatory Visit: Payer: Self-pay | Admitting: *Deleted

## 2022-11-26 MED ORDER — ROSUVASTATIN CALCIUM 10 MG PO TABS: 10.0000 mg | ORAL_TABLET | Freq: Every day | ORAL | 3 refills | Status: DC

## 2022-12-03 ENCOUNTER — Other Ambulatory Visit: Payer: Self-pay | Admitting: Cardiology

## 2022-12-03 DIAGNOSIS — I10 Essential (primary) hypertension: Secondary | ICD-10-CM

## 2022-12-22 ENCOUNTER — Other Ambulatory Visit: Payer: Self-pay | Admitting: Cardiology

## 2022-12-29 ENCOUNTER — Other Ambulatory Visit: Payer: Medicare HMO

## 2022-12-29 ENCOUNTER — Ambulatory Visit (INDEPENDENT_AMBULATORY_CARE_PROVIDER_SITE_OTHER): Payer: Medicare HMO | Admitting: Family Medicine

## 2022-12-29 ENCOUNTER — Encounter: Payer: Self-pay | Admitting: Family Medicine

## 2022-12-29 VITALS — BP 124/88 | HR 46 | Ht 63.0 in | Wt 173.0 lb

## 2022-12-29 DIAGNOSIS — N1831 Chronic kidney disease, stage 3a: Secondary | ICD-10-CM | POA: Diagnosis not present

## 2022-12-29 DIAGNOSIS — M329 Systemic lupus erythematosus, unspecified: Secondary | ICD-10-CM

## 2022-12-29 DIAGNOSIS — J449 Chronic obstructive pulmonary disease, unspecified: Secondary | ICD-10-CM

## 2022-12-29 DIAGNOSIS — IMO0002 Reserved for concepts with insufficient information to code with codable children: Secondary | ICD-10-CM

## 2022-12-29 DIAGNOSIS — I10 Essential (primary) hypertension: Secondary | ICD-10-CM | POA: Diagnosis not present

## 2022-12-29 DIAGNOSIS — K089 Disorder of teeth and supporting structures, unspecified: Secondary | ICD-10-CM

## 2022-12-29 MED ORDER — HYDROXYCHLOROQUINE SULFATE 200 MG PO TABS: 200.0000 mg | ORAL_TABLET | Freq: Every day | ORAL | 0 refills | Status: DC

## 2022-12-29 NOTE — Assessment & Plan Note (Signed)
Follow renal function Q 6 months.

## 2022-12-29 NOTE — Assessment & Plan Note (Addendum)
To call me back with the name of a rheumatologist in [place] that she would like to see.  I think a new consultation would be helpful it sounds like her previous provider was not 100% convinced that she really has lupus.  Will bridge her Plaquenil for now. Stressed the importance or regular eye exam with this medication.  Encouraged her to schedule.

## 2022-12-29 NOTE — Assessment & Plan Note (Addendum)
Well controlled. Continue amlodipine 10 mg, hydralazine 50 mg 3 times daily, and diltiazem 30 mg and Lasix as needed.

## 2022-12-29 NOTE — Assessment & Plan Note (Signed)
Later started the Symbicort so she is not quite sure yet if it is helpful.  She is just noted she is more short of breath with activities particularly going up and down stairs.

## 2022-12-29 NOTE — Progress Notes (Signed)
Established Patient Office Visit  Subjective   Patient ID: Rebekah Harrell, female    DOB: 1941/06/23  Age: 82 y.o. MRN: [id number]  Chief Complaint  Patient presents with   COPD   Hypertension    HPI Here today for 18-month follow-up.  Hypertension- Pt denies chest pain, SOB, dizziness, or heart palpitations.  Taking meds as directed w/o problems.  Denies medication side effects.    F/U COPD -she is currently on Symbicort twice a day.  Also on Singulair and Allegra for allergies.  It took her a while to get the Symbicort covered but she is now enrolled in a program where she is getting it for free through her health insurance.  She did go see the ENT about her loss of taste and smell she says they treated her for sinus infection.  And she still does not really have her taste or smell.  He is also been having significant dental loss and decay over the last 4 months.  She has had to have 7 teeth extracted.  She also went to see the rheumatologist a little over 6 months ago she had a positive ANA and was told that she might have lupus though he did not seem convinced that it was a definitive diagnosis.  She has a lot of pain and problems with her back but no joint pain or swelling.  He placed her on Plaquenil she has been on it for about 6 months.  Has not had an up-to-date eye exam.  Interestingly,the excess vaginal d/c she was experiencing has been much better since being on the medication.     ROS    Objective:     BP 124/88   Pulse (!) 46   Ht 5\' 3"  (1.6 m)   Wt 173 lb (78.5 kg)   SpO2 96%   BMI 30.65 kg/m    Physical Exam Vitals and nursing note reviewed.  Constitutional:      Appearance: She is well-developed.  HENT:     Head: Normocephalic and atraumatic.  Cardiovascular:     Rate and Rhythm: Normal rate and regular rhythm.     Heart sounds: Normal heart sounds.  Pulmonary:     Effort: Pulmonary effort is normal.     Breath sounds: Normal breath sounds.   Skin:    General: Skin is warm and dry.  Neurological:     Mental Status: She is alert and oriented to person, place, and time.  Psychiatric:        Behavior: Behavior normal.      No results found for any visits on 12/29/22.    The ASCVD Risk score (Arnett DK, et al., 2019) failed to calculate for the following reasons:   The 2019 ASCVD risk score is only valid for ages 84 to 60    Assessment & Plan:   Problem List Items Addressed This Visit       Cardiovascular and Mediastinum   HYPERTENSION, BENIGN ESSENTIAL - Primary    Well controlled. Continue amlodipine 10 mg, hydralazine 50 mg 3 times daily, and diltiazem 30 mg and Lasix as needed.        Respiratory   COPD (chronic obstructive pulmonary disease) (HCC)    Later started the Symbicort so she is not quite sure yet if it is helpful.  She is just noted she is more short of breath with activities particularly going up and down stairs.        Genitourinary  CKD (chronic kidney disease) stage 3, GFR 30-59 ml/min (HCC)    Follow renal function Q 6 months.         Other   Lupus ([place])    To call me back with the name of a rheumatologist in [place] that she would like to see.  I think a new consultation would be helpful it sounds like her previous provider was not 100% convinced that she really has lupus.  Will bridge her Plaquenil for now. Stressed the importance or regular eye exam with this medication.  Encouraged her to schedule.       Other Visit Diagnoses     Dental disease          Dental disease -we will check with our clinical pharmacist to see if any of her medications could be causing the dental issues.  The only new medication really is the Plaquenil which she started about 6 months ago.  Thing else she has been on for quite a while.  Return in about 4 months (around 04/29/2023) for BP and COPD.    Rebekah Lecher, MD

## 2023-01-16 ENCOUNTER — Telehealth: Payer: Self-pay | Admitting: Student

## 2023-01-16 NOTE — Telephone Encounter (Signed)
Resc patient's ct and made there follow up appt pt aware

## 2023-01-19 ENCOUNTER — Ambulatory Visit (INDEPENDENT_AMBULATORY_CARE_PROVIDER_SITE_OTHER): Payer: Medicare HMO | Admitting: Family Medicine

## 2023-01-19 DIAGNOSIS — Z0000 Encounter for general adult medical examination without abnormal findings: Secondary | ICD-10-CM | POA: Diagnosis not present

## 2023-01-19 DIAGNOSIS — Z780 Asymptomatic menopausal state: Secondary | ICD-10-CM | POA: Diagnosis not present

## 2023-01-19 NOTE — Patient Instructions (Addendum)
[place] Maintenance Summary and Written Plan of Care  [name] ,  Thank you for allowing me to perform your Medicare Annual Wellness Visit and for your ongoing commitment to your health.   Health Maintenance & Immunization History Health Maintenance  Topic Date Due   COVID-19 Vaccine (1) 01/15/2024 (Originally 10/20/1946)   Pneumonia Vaccine 43+ Years old (1 - PCV) 01/20/2024 (Originally 10/20/2006)   INFLUENZA VACCINE  03/21/2024 (Originally 07/22/2022)   Zoster Vaccines- Shingrix (1 of 2) 03/29/2024 (Originally 10/20/1960)   Medicare Annual Wellness (AWV)  01/20/2024   DTaP/Tdap/Td (3 - Td or Tdap) 07/01/2029   DEXA SCAN  Completed   HPV VACCINES  Aged Out   Immunization History  Administered Date(s) Administered   Td 05/24/2001   Tdap 07/02/2019    These are the patient goals that we discussed:  Goals Addressed               This Visit's Progress     Patient Stated (pt-stated)        Patient stated that she would like to start eating healthier and exercising.         This is a list of Health Maintenance Items that are overdue or due now: Pneumococcal vaccine  Influenza vaccine Bone densitometry screening Shingrix vaccine  Patient declined the vaccines at this time.    Orders/Referrals Placed Today: Orders Placed This Encounter  Procedures   DEXAScan    Standing Status:   Future    Standing Expiration Date:   01/20/2024    Scheduling Instructions:     Please call patient schedule.    Order Specific Question:   Reason for exam:    Answer:   Post menopausal    Order Specific Question:   Preferred imaging location?    Answer:   [facility]   (Contact our referral department at [phone] if you have not spoken with someone about your referral appointment within the next 5 days)    Follow-up Plan Follow-up with Hali Marry, MD as planned Medicare wellness visit in one year. Patient will access  AVS on my chart.      Health Maintenance, Female Adopting a healthy lifestyle and getting preventive care are important in promoting health and wellness. Ask your health care provider about: The right schedule for you to have regular tests and exams. Things you can do on your own to prevent diseases and keep yourself healthy. What should I know about diet, weight, and exercise? Eat a healthy diet  Eat a diet that includes plenty of vegetables, fruits, low-fat dairy products, and lean protein. Do not eat a lot of foods that are high in solid fats, added sugars, or sodium. Maintain a healthy weight Body mass index (BMI) is used to identify weight problems. It estimates body fat based on height and weight. Your health care provider can help determine your BMI and help you achieve or maintain a healthy weight. Get regular exercise Get regular exercise. This is one of the most important things you can do for your health. Most adults should: Exercise for at least 150 minutes each week. The exercise should increase your heart rate and make you sweat (moderate-intensity exercise). Do strengthening exercises at least twice a week. This is in addition to the moderate-intensity exercise. Spend less time sitting. Even light physical activity can be beneficial. Watch cholesterol and blood lipids Have your blood tested for lipids and cholesterol at 82 years of age, then have this  test every 5 years. Have your cholesterol levels checked more often if: Your lipid or cholesterol levels are high. You are older than 82 years of age. You are at high risk for heart disease. What should I know about cancer screening? Depending on your health history and family history, you may need to have cancer screening at various ages. This may include screening for: Breast cancer. Cervical cancer. Colorectal cancer. Skin cancer. Lung cancer. What should I know about heart disease, diabetes, and high blood  pressure? Blood pressure and heart disease High blood pressure causes heart disease and increases the risk of stroke. This is more likely to develop in people who have high blood pressure readings or are overweight. Have your blood pressure checked: Every 3-5 years if you are 54-1 years of age. Every year if you are 49 years old or older. Diabetes Have regular diabetes screenings. This checks your fasting blood sugar level. Have the screening done: Once every three years after age 39 if you are at a normal weight and have a low risk for diabetes. More often and at a younger age if you are overweight or have a high risk for diabetes. What should I know about preventing infection? Hepatitis B If you have a higher risk for hepatitis B, you should be screened for this virus. Talk with your health care provider to find out if you are at risk for hepatitis B infection. Hepatitis C Testing is recommended for: Everyone born from [birth year] through 1965. Anyone with known risk factors for hepatitis C. Sexually transmitted infections (STIs) Get screened for STIs, including gonorrhea and chlamydia, if: You are sexually active and are younger than 82 years of age. You are older than 82 years of age and your health care provider tells you that you are at risk for this type of infection. Your sexual activity has changed since you were last screened, and you are at increased risk for chlamydia or gonorrhea. Ask your health care provider if you are at risk. Ask your health care provider about whether you are at high risk for HIV. Your health care provider may recommend a prescription medicine to help prevent HIV infection. If you choose to take medicine to prevent HIV, you should first get tested for HIV. You should then be tested every 3 months for as long as you are taking the medicine. Pregnancy If you are about to stop having your period (premenopausal) and you may become pregnant, seek counseling before you  get pregnant. Take 400 to 800 micrograms (mcg) of folic acid every day if you become pregnant. Ask for birth control (contraception) if you want to prevent pregnancy. Osteoporosis and menopause Osteoporosis is a disease in which the bones lose minerals and strength with aging. This can result in bone fractures. If you are 26 years old or older, or if you are at risk for osteoporosis and fractures, ask your health care provider if you should: Be screened for bone loss. Take a calcium or vitamin D supplement to lower your risk of fractures. Be given hormone replacement therapy (HRT) to treat symptoms of menopause. Follow these instructions at home: Alcohol use Do not drink alcohol if: Your health care provider tells you not to drink. You are pregnant, may be pregnant, or are planning to become pregnant. If you drink alcohol: Limit how much you have to: 0-1 drink a day. Know how much alcohol is in your drink. In the [place] one drink equals one 12 oz bottle of beer (  355 mL), one 5 oz glass of wine (148 mL), or one 1 oz glass of hard liquor (44 mL). Lifestyle Do not use any products that contain nicotine or tobacco. These products include cigarettes, chewing tobacco, and vaping devices, such as e-cigarettes. If you need help quitting, ask your health care provider. Do not use street drugs. Do not share needles. Ask your health care provider for help if you need support or information about quitting drugs. General instructions Schedule regular health, dental, and eye exams. Stay current with your vaccines. Tell your health care provider if: You often feel depressed. You have ever been abused or do not feel safe at home. Summary Adopting a healthy lifestyle and getting preventive care are important in promoting health and wellness. Follow your health care provider's instructions about healthy diet, exercising, and getting tested or screened for diseases. Follow your health care provider's  instructions on monitoring your cholesterol and blood pressure. This information is not intended to replace advice given to you by your health care provider. Make sure you discuss any questions you have with your health care provider. Document Revised: 04/29/2021 Document Reviewed: 04/29/2021 Elsevier Patient Education  [place].

## 2023-01-19 NOTE — Progress Notes (Signed)
MEDICARE ANNUAL WELLNESS VISIT  01/19/2023  Telephone Visit Disclaimer This Medicare AWV was conducted by telephone due to national recommendations for restrictions regarding the COVID-19 Pandemic (e.g. social distancing).  I verified, using two identifiers, that I am speaking with Rebekah Harrell or their authorized healthcare agent. I discussed the limitations, risks, security, and privacy concerns of performing an evaluation and management service by telephone and the potential availability of an in-person appointment in the future. The patient expressed understanding and agreed to proceed.  Location of Patient: Home Location of Provider (nurse):  In the office.  Subjective:    Rebekah Harrell is a 82 y.o. female patient of Metheney, Rene Kocher, MD who had a Medicare Annual Wellness Visit today via telephone. Maral is Retired and lives alone. she has 3 children. she reports that she is socially active and does interact with friends/family regularly. she is minimally physically active and enjoys watching television.  Patient Care Team: Hali Marry, MD as PCP - General (Family Medicine) Pablo Lawrence (Allergy and Immunology) Darius Bump, [facility] as Pharmacist (Pharmacist)     01/19/2023   11:14 AM 01/15/2022   10:04 AM 04/26/2021    3:12 PM 03/07/2019    1:20 PM 12/27/2018    3:24 PM 12/09/2016    5:32 PM 11/26/2016   10:00 PM  Advanced Directives  Does Patient Have a Medical Advance Directive? No No No No No No No  Would patient like information on creating a medical advance directive? No - Patient declined No - Patient declined Yes (MAU/Ambulatory/Procedural Areas - Information given) No - Patient declined No - Patient declined No - Patient declined No - Patient declined    Hospital Utilization Over the Past 12 Months: # of hospitalizations or ER visits: 0 # of surgeries: 0  Review of Systems    Patient reports that her overall health is worse compared to  last year.  History obtained from chart review and the patient  Patient Reported Readings (BP, Pulse, CBG, Weight, etc) none  Pain Assessment Pain : No/denies pain     Current Medications & Allergies (verified) Allergies as of 01/19/2023       Reactions   Ace Inhibitors Swelling   Tramadol Other (See Comments)   Night sweats, bad dreams   Atorvastatin Other (See Comments)   Myalgias   Simvastatin    Myalgia   Celecoxib Other (See Comments)   REACTION: hair loss   Cetirizine Hcl Other (See Comments)   REACTION: fatigue   Penicillins Other (See Comments)   Unknown   Sulfonamide Derivatives Rash        Medication List        Accurate as of January 19, 2023 11:35 AM. If you have any questions, ask your nurse or doctor.          amLODipine 10 MG tablet Commonly known as: NORVASC TAKE 1 TABLET EVERY DAY (MUST KEEP SCHEDULED APPT)   CALCIUM PO Take 1 tablet by mouth at bedtime.   Cholecalciferol 125 MCG (5000 UT) capsule Take 5,000 Units by mouth daily.   diltiazem 30 MG tablet Commonly known as: Cardizem Take 1 tablet every 4 hours AS NEEDED for heart rate >100 as long as top bp >100   famotidine 20 MG tablet Commonly known as: PEPCID Take 1 tablet (20 mg total) by mouth at bedtime.   fexofenadine 180 MG tablet Commonly known as: ALLEGRA TAKE 1 TABLET EVERY DAY   furosemide 20 MG tablet  Commonly known as: LASIX Take 1 tablet (20 mg total) by mouth daily as needed.   hydrALAZINE 50 MG tablet Commonly known as: APRESOLINE TAKE 1 TABLET THREE TIMES DAILY   hydroxychloroquine 200 MG tablet Commonly known as: PLAQUENIL Take 1 tablet (200 mg total) by mouth daily.   Magnesium 200 MG Tabs Take 1 tablet (200 mg total) by mouth daily.   montelukast 10 MG tablet Commonly known as: SINGULAIR TAKE 1 TABLET AT BEDTIME   MULTIVITAMIN PO Take 1 tablet by mouth at bedtime.   omeprazole 20 MG capsule Commonly known as: PRILOSEC TAKE 1 CAPSULE EVERY  DAY   potassium chloride SA 20 MEQ tablet Commonly known as: KLOR-CON M TAKE 1 TABLET EVERY DAY   rosuvastatin 10 MG tablet Commonly known as: Crestor Take 1 tablet (10 mg total) by mouth daily.   sotalol 80 MG tablet Commonly known as: BETAPACE TAKE 1 TABLET THREE TIMES DAILY   Symbicort 160-4.5 MCG/ACT inhaler Generic drug: budesonide-formoterol Inhale 2 puffs into the lungs in the morning and at bedtime.   Zinc 50 MG Tabs Take 1 tablet by mouth daily.        History (reviewed): Past Medical History:  Diagnosis Date   Allergy    Aortic atherosclerosis (HCC)    Arthritis    "lower back; knees" (11/26/2016)   Asthma    Chronic atrial fibrillation (HCC)    CKD (chronic kidney disease), stage III (HCC)    patient unaware of this on 11/26/2016   Complication of anesthesia    "had trouble waking me up in the late 1990's after gallbladder OR"   COPD (chronic obstructive pulmonary disease) (HCC)    GERD (gastroesophageal reflux disease)    Heart murmur    "dx'd when I was a little kid"   History of hiatal hernia    Hyperlipidemia    Hypertension    Hypoglycemia    Past Surgical History:  Procedure Laterality Date   CHOLECYSTECTOMY OPEN  1990s   TONSILLECTOMY     TUBAL LIGATION     Family History  Problem Relation Age of Onset   CAD Mother        HEART ATTACK   Colon cancer Father        KIDNEY FAILURE/COLON CANCER   Atrial fibrillation Brother    Healthy Brother    Heart failure Maternal Grandmother    Kidney failure Maternal Grandfather    Emphysema Maternal Grandfather    Heart attack Paternal Grandmother    Social History   Socioeconomic History   Marital status: Divorced    Spouse name: Not on file   Number of children: 3   Years of education: 14   Highest education level: Associate degree: academic program  Occupational History   Occupation: stages houses for sale    Comment: self employed   Occupation: Retired.  Tobacco Use   Smoking  status: Never   Smokeless tobacco: Never  Vaping Use   Vaping Use: Never used  Substance and Sexual Activity   Alcohol use: Not Currently   Drug use: No   Sexual activity: Not Currently  Other Topics Concern   Not on file  Social History Narrative   Lives alone. One son lives close by incase she needs anything. She enjoys crafting and decorating.    Social Determinants of Health   Financial Resource Strain: Low Risk  (01/19/2023)   Overall Financial Resource Strain (CARDIA)    Difficulty of Paying Living Expenses: Not hard at  all  Food Insecurity: No Food Insecurity (01/19/2023)   Hunger Vital Sign    Worried About Running Out of Food in the Last Year: Never true    Ran Out of Food in the Last Year: Never true  Transportation Needs: No Transportation Needs (01/19/2023)   PRAPARE - Hydrologist (Medical): No    Lack of Transportation (Non-Medical): No  Physical Activity: Inactive (01/19/2023)   Exercise Vital Sign    Days of Exercise per Week: 0 days    Minutes of Exercise per Session: 0 min  Stress: No Stress Concern Present (01/19/2023)   [place]    Feeling of Stress : Only a little  Social Connections: Socially Isolated (01/19/2023)   Social Connection and Isolation Panel [NHANES]    Frequency of Communication with Friends and Family: Three times a week    Frequency of Social Gatherings with Friends and Family: Never    Attends Religious Services: Never    Marine scientist or Organizations: No    Attends Archivist Meetings: Never    Marital Status: Divorced    Activities of Daily Living    01/19/2023   11:27 AM  In your present state of health, do you have any difficulty performing the following activities:  Hearing? 0  Vision? 0  Difficulty concentrating or making decisions? 0  Walking or climbing stairs? 0  Dressing or bathing? 0  Doing errands, shopping?  0  Preparing Food and eating ? N  Using the Toilet? N  In the past six months, have you accidently leaked urine? N  Do you have problems with loss of bowel control? N  Managing your Medications? N  Managing your Finances? N  Housekeeping or managing your Housekeeping? N    Patient Education/ Literacy How often do you need to have someone help you when you read instructions, pamphlets, or other written materials from your doctor or pharmacy?: 1 - Never What is the last grade level you completed in school?: some college  Exercise Current Exercise Habits: The patient does not participate in regular exercise at present, Exercise limited by: orthopedic condition(s)  Diet Patient reports consuming 2 meals a day and 1 snack(s) a day Patient reports that her primary diet is: Regular Patient reports that she does have regular access to food.   Depression Screen    01/19/2023   11:14 AM 12/29/2022    1:45 PM 08/27/2022   11:25 AM 01/15/2022   10:06 AM 06/05/2020   11:42 AM 03/07/2019    1:21 PM 04/23/2018    2:03 PM  PHQ 2/9 Scores  PHQ - 2 Score 1 1 3 4 2  0 2  PHQ- 9 Score   13 14 10  8      Fall Risk    01/19/2023   11:14 AM 12/29/2022    1:45 PM 08/27/2022   11:25 AM 01/15/2022   10:06 AM 01/13/2022   10:31 AM  Fall Risk   Falls in the past year? 1 0 0 0 0  Number falls in past yr: 0 0 0 0   Injury with Fall? 1 0 0 0   Risk for fall due to : History of fall(s) No Fall Risks No Fall Risks No Fall Risks   Follow up Falls evaluation completed;Education provided;Falls prevention discussed Falls evaluation completed Falls evaluation completed Falls evaluation completed      Objective:  GLENOLA WHEAT seemed  alert and oriented and she participated appropriately during our telephone visit.  Blood Pressure Weight BMI  BP Readings from Last 3 Encounters:  12/29/22 124/88  08/27/22 (!) 116/47  07/10/22 114/66   Wt Readings from Last 3 Encounters:  12/29/22 173 lb (78.5 kg)  08/27/22 177  lb 0.6 oz (80.3 kg)  07/10/22 180 lb (81.6 kg)   BMI Readings from Last 1 Encounters:  12/29/22 30.65 kg/m    *Unable to obtain current vital signs, weight, and BMI due to telephone visit type  Hearing/Vision  Cashlyn did not seem to have difficulty with hearing/understanding during the telephone conversation Reports that she has not had a formal eye exam by an eye care professional within the past year Reports that she has not had a formal hearing evaluation within the past year *Unable to fully assess hearing and vision during telephone visit type  Cognitive Function:    01/19/2023   11:30 AM 01/15/2022   10:21 AM 03/07/2019    1:25 PM  6CIT Screen  What Year? 0 points 0 points 0 points  What month? 0 points 0 points 0 points  What time? 0 points 0 points 0 points  Count back from 20 0 points 0 points 0 points  Months in reverse 0 points 0 points 0 points  Repeat phrase 0 points 0 points 2 points  Total Score 0 points 0 points 2 points   (Normal:0-7, Significant for Dysfunction: >8)  Normal Cognitive Function Screening: Yes   Immunization & Health Maintenance Record Immunization History  Administered Date(s) Administered   Td 05/24/2001   Tdap 07/02/2019    Health Maintenance  Topic Date Due   COVID-19 Vaccine (1) 01/15/2024 (Originally 10/20/1946)   Pneumonia Vaccine 26+ Years old (1 - PCV) 01/20/2024 (Originally 10/20/2006)   INFLUENZA VACCINE  03/21/2024 (Originally 07/22/2022)   Zoster Vaccines- Shingrix (1 of 2) 03/29/2024 (Originally 10/20/1960)   Medicare Annual Wellness (AWV)  01/20/2024   DTaP/Tdap/Td (3 - Td or Tdap) 07/01/2029   DEXA SCAN  Completed   HPV VACCINES  Aged Out       Assessment  This is a routine wellness examination for Rebekah Harrell.  Health Maintenance: Due or Overdue There are no preventive care reminders to display for this patient.   Rebekah Harrell does not need a referral for Community Assistance: Care  Management:   no Social Work:    no Prescription Assistance:  no Nutrition/Diabetes Education:  no   Plan:  Personalized Goals  Goals Addressed               This Visit's Progress     Patient Stated (pt-stated)        Patient stated that she would like to start eating healthier and exercising.       Personalized Health Maintenance & Screening Recommendations  Pneumococcal vaccine  Influenza vaccine Bone densitometry screening Shingrix vaccine  Patient declined the vaccines at this time.  Lung Cancer Screening Recommended: no (Low Dose CT Chest recommended if Age 59-80 years, 30 pack-year currently smoking OR have quit w/in past 15 years) Hepatitis C Screening recommended: no HIV Screening recommended: no  Advanced Directives: Written information was not prepared per patient's request.  Referrals & Orders Orders Placed This Encounter  Procedures   [place]    Follow-up Plan Follow-up with Hali Marry, MD as planned Medicare wellness visit in one year. Patient will access AVS on my chart.   I have personally reviewed and noted the  following in the patient's chart:   Medical and social history Use of alcohol, tobacco or illicit drugs  Current medications and supplements Functional ability and status Nutritional status Physical activity Advanced directives List of other physicians Hospitalizations, surgeries, and ER visits in previous 12 months Vitals Screenings to include cognitive, depression, and falls Referrals and appointments  In addition, I have reviewed and discussed with Rebekah Harrell certain preventive protocols, quality metrics, and best practice recommendations. A written personalized care plan for preventive services as well as general preventive health recommendations is available and can be mailed to the patient at her request.      Tinnie Gens, RN BSN  01/19/2023

## 2023-01-22 ENCOUNTER — Ambulatory Visit (INDEPENDENT_AMBULATORY_CARE_PROVIDER_SITE_OTHER): Payer: Medicare HMO

## 2023-01-22 DIAGNOSIS — R911 Solitary pulmonary nodule: Secondary | ICD-10-CM

## 2023-01-22 DIAGNOSIS — I700 Atherosclerosis of aorta: Secondary | ICD-10-CM | POA: Diagnosis not present

## 2023-01-22 DIAGNOSIS — Q2547 Right aortic arch: Secondary | ICD-10-CM | POA: Diagnosis not present

## 2023-01-22 DIAGNOSIS — R918 Other nonspecific abnormal finding of lung field: Secondary | ICD-10-CM | POA: Diagnosis not present

## 2023-01-22 DIAGNOSIS — K449 Diaphragmatic hernia without obstruction or gangrene: Secondary | ICD-10-CM | POA: Diagnosis not present

## 2023-01-29 ENCOUNTER — Encounter (HOSPITAL_COMMUNITY): Payer: Self-pay | Admitting: *Deleted

## 2023-02-01 NOTE — Progress Notes (Unsigned)
Synopsis: Referred for COPD by Hali Marry, *  Subjective:   PATIENT ID: Rebekah Harrell GENDER: female DOB: Sep 02, 1941, MRN: [id number]  No chief complaint on file.  80yF with history of allergy, asthma, COPD, AF, CKD, GERD, hiatal hernia, +ANA with recurrent angioedema of unclear etiology on plaquenil followed by [facility], referred for COPD and worsening DOE. She never had covid-19 infection.  She says she's had breathing problems ever since she was little, attributes it at least in part to secondhand smoke exposure in past.   Now has DOE where she has stop speaking to catch her breath if she's in conversation however she is able to relate a good history today without interruption. DOE to 100 feet. She has no cough. No orthopnea. Weight has gone up recently. She was on course of prednisone for 10 days over January prescribed by rheumatologist but she noticed no effect on her breathing. Very rare sensation of dysphagia - doesn't frequently get choked on food/liquid. She snores once in a while. No PND. She is sleepy during the day but attributes it to her antihistamine regimen.   She didn't tolerate wixela well due to the dry powder formulation causing cough.   She sees allergist at [facility] in [place] sees Dr. Clarene Essex. On a bunch of antihistamines due to recurrent angioedema of unclear etiology. Has not been on AIT  No family history of lung disease  She worked staging houses in past, Hydrographic surveyor in past. Lots of secondhand smoke exposure but she never smoked herself, no vaping/MJ. Has a dog and cat at home.   Interval HPI: HRCT showed subtle subpleural reticular opacities, air trapping, RLL 20m ggo, calcified LNs  PFTs today with obstruction and borderline bronchodilator response.  She has had worsening DOE since last visit. She worries about deconditioning due to lower back pain. Her weight has not changed significantly since last visit. No cough.   ----------------------------------------- Started on symbicort 160 last visit  HRCT similar to prior  Otherwise pertinent review of systems is negative.  Past Medical History:  Diagnosis Date   Allergy    Aortic atherosclerosis ([facility]    Arthritis    "lower back; knees" (11/26/2016)   Asthma    Chronic atrial fibrillation (HCC)    CKD (chronic kidney disease), stage III ([facility]    patient unaware of this on [id number]  Complication of anesthesia    "had trouble waking me up in the late 1990's after gallbladder OR"   COPD (chronic obstructive pulmonary disease) (HCC)    GERD (gastroesophageal reflux disease)    Heart murmur    "dx'd when I was a little kid"   History of hiatal hernia    Hyperlipidemia    Hypertension    Hypoglycemia      Family History  Problem Relation Age of Onset   CAD Mother        HEART ATTACK   Colon cancer Father        KIDNEY FAILURE/COLON CANCER   Atrial fibrillation Brother    Healthy Brother    Heart failure Maternal Grandmother    Kidney failure Maternal Grandfather    Emphysema Maternal Grandfather    Heart attack Paternal Grandmother      Past Surgical History:  Procedure Laterality Date   CHOLECYSTECTOMY OPEN  1990s   TONSILLECTOMY     TUBAL LIGATION      Social History   Socioeconomic History   Marital status: Divorced  Spouse name: Not on file   Number of children: 3   Years of education: 14   Highest education level: Associate degree: academic program  Occupational History   Occupation: stages houses for sale    Comment: self employed   Occupation: Retired.  Tobacco Use   Smoking status: Never   Smokeless tobacco: Never  Vaping Use   Vaping Use: Never used  Substance and Sexual Activity   Alcohol use: Not Currently   Drug use: No   Sexual activity: Not Currently  Other Topics Concern   Not on file  Social History Narrative   Lives alone. One son lives close by incase she needs anything. She enjoys crafting and  decorating.    Social Determinants of Health   Financial Resource Strain: Low Risk  (01/19/2023)   Overall Financial Resource Strain (CARDIA)    Difficulty of Paying Living Expenses: Not hard at all  Food Insecurity: No Food Insecurity (01/19/2023)   Hunger Vital Sign    Worried About Running Out of Food in the Last Year: Never true    Ran Out of Food in the Last Year: Never true  Transportation Needs: No Transportation Needs (01/19/2023)   PRAPARE - Hydrologist (Medical): No    Lack of Transportation (Non-Medical): No  Physical Activity: Inactive (01/19/2023)   Exercise Vital Sign    Days of Exercise per Week: 0 days    Minutes of Exercise per Session: 0 min  Stress: No Stress Concern Present (01/19/2023)   [place]    Feeling of Stress : Only a little  Social Connections: Socially Isolated (01/19/2023)   Social Connection and Isolation Panel [NHANES]    Frequency of Communication with Friends and Family: Three times a week    Frequency of Social Gatherings with Friends and Family: Never    Attends Religious Services: Never    Marine scientist or Organizations: No    Attends Archivist Meetings: Never    Marital Status: Divorced  Human resources officer Violence: Not At Risk (01/19/2023)   Humiliation, Afraid, Rape, and Kick questionnaire    Fear of Current or Ex-Partner: No    Emotionally Abused: No    Physically Abused: No    Sexually Abused: No     Allergies  Allergen Reactions   Ace Inhibitors Swelling   Tramadol Other (See Comments)    Night sweats, bad dreams   Atorvastatin Other (See Comments)    Myalgias   Simvastatin     Myalgia    Celecoxib Other (See Comments)    REACTION: hair loss   Cetirizine Hcl Other (See Comments)    REACTION: fatigue   Penicillins Other (See Comments)    Unknown   Sulfonamide Derivatives Rash     Outpatient Medications Prior to  Visit  Medication Sig Dispense Refill   amLODipine (NORVASC) 10 MG tablet TAKE 1 TABLET EVERY DAY (MUST KEEP SCHEDULED APPT) 90 tablet 3   CALCIUM PO Take 1 tablet by mouth at bedtime.     Cholecalciferol 125 MCG (5000 UT) capsule Take 5,000 Units by mouth daily.     diltiazem (CARDIZEM) 30 MG tablet Take 1 tablet every 4 hours AS NEEDED for heart rate >100 as long as top bp >100 45 tablet 1   famotidine (PEPCID) 20 MG tablet Take 1 tablet (20 mg total) by mouth at bedtime. 90 tablet 3   fexofenadine (ALLEGRA) 180 MG tablet  TAKE 1 TABLET EVERY DAY 90 tablet 3   furosemide (LASIX) 20 MG tablet Take 1 tablet (20 mg total) by mouth daily as needed. 90 tablet 3   hydrALAZINE (APRESOLINE) 50 MG tablet TAKE 1 TABLET THREE TIMES DAILY 270 tablet 3   hydroxychloroquine (PLAQUENIL) 200 MG tablet Take 1 tablet (200 mg total) by mouth daily. 90 tablet 0   Magnesium 200 MG TABS Take 1 tablet (200 mg total) by mouth daily. 60 each    montelukast (SINGULAIR) 10 MG tablet TAKE 1 TABLET AT BEDTIME 90 tablet 1   Multiple Vitamins-Minerals (MULTIVITAMIN PO) Take 1 tablet by mouth at bedtime.     omeprazole (PRILOSEC) 20 MG capsule TAKE 1 CAPSULE EVERY DAY 90 capsule 3   potassium chloride SA (KLOR-CON M) 20 MEQ tablet TAKE 1 TABLET EVERY DAY 90 tablet 3   rosuvastatin (CRESTOR) 10 MG tablet Take 1 tablet (10 mg total) by mouth daily. 90 tablet 3   sotalol (BETAPACE) 80 MG tablet TAKE 1 TABLET THREE TIMES DAILY 270 tablet 2   SYMBICORT 160-4.5 MCG/ACT inhaler Inhale 2 puffs into the lungs in the morning and at bedtime. 30.6 each 3   Zinc 50 MG TABS Take 1 tablet by mouth daily.     No facility-administered medications prior to visit.       Objective:   Physical Exam:  General appearance: 82 y.o., female, NAD, conversant  Eyes: anicteric sclerae; PERRL, tracking appropriately HENT: NCAT; MMM Neck: Trachea midline; no lymphadenopathy, no JVD Lungs: CTAB, no crackles, no wheeze, with normal respiratory  effort CV: RRR, no murmur  Abdomen: Soft, non-tender; non-distended, BS present  Extremities: No peripheral edema, warm Skin: Normal turgor and texture; no rash Psych: Appropriate affect Neuro: Alert and oriented to person and place, no focal deficit     There were no vitals filed for this visit.     on RA BMI Readings from Last 3 Encounters:  12/29/22 30.65 kg/m  08/27/22 31.36 kg/m  07/10/22 31.89 kg/m   Wt Readings from Last 3 Encounters:  12/29/22 173 lb (78.5 kg)  08/27/22 177 lb 0.6 oz (80.3 kg)  07/10/22 180 lb (81.6 kg)     CBC    Component Value Date/Time   WBC 7.3 05/08/2022 1537   RBC 4.67 05/08/2022 1537   HGB 13.4 05/08/2022 1537   HGB 12.9 11/18/2017 1205   HCT 40.6 05/08/2022 1537   HCT 38.2 11/18/2017 1205   PLT 235.0 05/08/2022 1537   PLT 306 11/18/2017 1205   MCV 87.0 05/08/2022 1537   MCV 84 11/18/2017 1205   MCH 28.1 05/03/2021 1231   MCHC 32.9 05/08/2022 1537   RDW 14.6 05/08/2022 1537   RDW 14.2 11/18/2017 1205   LYMPHSABS 2.2 05/08/2022 1537   MONOABS 0.7 05/08/2022 1537   EOSABS 0.3 05/08/2022 1537   BASOSABS 0.1 05/08/2022 1537    Eos 240  Chest Imaging: CXR 04/06/20 reviewed by me with prominent R hilum  HRCT Chest 6/14 reviewed by me with subtle subpleural reticular opacities, air trapping, RLL 1.40m ggo, calcified LNs  HRCT Chest 01/22/23 reviewed by me similar to prior  Pulmonary Functions Testing Results:    Latest Ref Rng & Units 07/10/2022    9:53 AM  PFT Results  FVC-Pre L 1.82   FVC-Predicted Pre % 72   FVC-Post L 1.98   FVC-Predicted Post % 79   Pre FEV1/FVC % % 68   Post FEV1/FCV % % 70   FEV1-Pre L 1.23  FEV1-Predicted Pre % 66   FEV1-Post L 1.40   DLCO uncorrected ml/min/mmHg 13.01   DLCO UNC% % 71   DLCO corrected ml/min/mmHg 13.01   DLCO COR %Predicted % 71   DLVA Predicted % 74   TLC L 5.56   TLC % Predicted % 113   RV % Predicted % 149    Mild obstruction, borderline bronchodilator response, air  trapping, mildly reduced diffusing capacity   Echocardiogram:   TTE 04/11/20: 1. Left ventricular ejection fraction, by estimation, is 55 to 60%. The  left ventricle has normal function. The left ventricle has no regional  wall motion abnormalities. Left ventricular diastolic parameters are  consistent with Grade II diastolic  dysfunction (pseudonormalization).   2. Right ventricular systolic function is normal. The right ventricular  size is normal. There is mildly elevated pulmonary artery systolic  pressure.   3. Left atrial size was moderately dilated.   4. The mitral valve is normal in structure. Mild to moderate mitral valve  regurgitation. No evidence of mitral stenosis.   5. The aortic valve is normal in structure. Aortic valve regurgitation is  not visualized. No aortic stenosis is present.   6. The inferior vena cava is normal in size with greater than 50%  respiratory variability, suggesting right atrial pressure of 3 mmHg.   7. Compared to echocardiogram from 2017 MR from mild now is mild to  moderate, LA size increased to moderate enlargement from mild. Disatolic  dysfunction.       Assessment & Plan:   # DOE # Asthma-copd overlap Does have undertreated ACOS. Alternatively could have early ILD (vs sequelae of prior indolent infx process like endemic fungal infx, etc given calcified mediastinal LAD or less likely sarcoid - neither of these look active to me on imaging), diastolic dysfunction (but euvolemic on exam today), angina, deconditioning.   # Recurrent angioedema, unclear etiology # Elevated ANA  # RLL 35m ggo   Plan: - start non DPI LABA/ICS will confirm least expensive option with pharmacy. Has cough with DPI inhalers.  - albuterol prn - CT Chest in 6 months - RTC 7 months to review CT Chest      NMaryjane Hurter MD [facility] 02/01/2023 11:16 AM

## 2023-02-03 ENCOUNTER — Encounter: Payer: Self-pay | Admitting: Student

## 2023-02-03 ENCOUNTER — Ambulatory Visit: Payer: Medicare HMO | Admitting: Student

## 2023-02-03 VITALS — BP 134/72 | HR 86 | Temp 98.0°F | Ht 64.0 in | Wt 176.0 lb

## 2023-02-03 DIAGNOSIS — R0609 Other forms of dyspnea: Secondary | ICD-10-CM | POA: Diagnosis not present

## 2023-02-03 DIAGNOSIS — J4489 Other specified chronic obstructive pulmonary disease: Secondary | ICD-10-CM

## 2023-02-03 DIAGNOSIS — R911 Solitary pulmonary nodule: Secondary | ICD-10-CM | POA: Diagnosis not present

## 2023-02-03 NOTE — Patient Instructions (Signed)
-   Symbicort 2 puffs twice daily, rinse mouth and brush tongue/teeth after each use - albuterol is your rescue - use as needed - ct chest in 1 year - see you in June/July or sooner if need be!

## 2023-03-12 ENCOUNTER — Other Ambulatory Visit: Payer: Self-pay | Admitting: Family Medicine

## 2023-03-12 DIAGNOSIS — K2100 Gastro-esophageal reflux disease with esophagitis, without bleeding: Secondary | ICD-10-CM

## 2023-04-02 ENCOUNTER — Other Ambulatory Visit: Payer: Self-pay | Admitting: Family Medicine

## 2023-04-13 NOTE — Progress Notes (Deleted)
   Cardiology Clinic Note   Date: 04/13/2023 ID: [name], DOB 1941/11/13, MRN [id number]  Primary Cardiologist:  None  Patient Profile    Rebekah Harrell is a 82 y.o. female who presents to the clinic today for 1 year follow-up.  Past medical history significant for: PAF. Onset June 2017. 24-hour Holter 09/01/2016: A-fib with elevated heart rate. Chronic systolic heart failure. Echo 04/11/2020: EF 55 to 60%.  Grade II DD.  Mildly elevated PA systolic pressure.  Moderate LAE.  Mild to moderate MR.  MR slightly increased from prior echo 2017. Hypertension. Hyperlipidemia. Lipid panel 08/27/2022: LDL 76, HDL 47, TG 215, total 154. COPD. IBS. GERD.   History of Present Illness    Rebekah Harrell was first evaluated by Dr. Jens Som on 08/21/2016 for atrial fibrillation.  24-hour Holter was performed which showed A-fib with elevated heart rate.  Patient was started on metoprolol for rate control.  She is continue to be followed for the above outlined history.  Patient was last seen in the office by Dr. Jens Som on 01/01/2022 with complaints of DOE.  It stopped Eliquis secondary to risk of bleeding at the expense.  Echo was repeated and outlined above.  Patient decided to restart Eliquis November 2023.  Today, patient ***  PAF.  Onset June 2017.  Patient stopped Eliquis for period time secondary to cost.  She restarted it November 2023.  She reports*** Continue sotalol, Eliquis.  As needed diltiazem. Chronic systolic heart failure/mild to moderate MR.  Echo April 2021 showed normal LV function, Grade II DD, mildly elevated PA systolic pressure, mild to moderate MR.  Patient*** Euvolemic and well compensated on exam. Continue Lasix, hydralazine, sotalol. Hypertension.  BP today*** Patient denies headaches or dizziness.  Continue amlodipine, hydralazine, sotalol. Hyperlipidemia.  LDL September 2023 76, at goal.  Continue rosuvastatin.   ROS: All other systems reviewed and are  otherwise negative except as noted in History of Present Illness.  Studies Reviewed    ECG personally reviewed by me today: ***  No significant changes from ***  Risk Assessment/Calculations    {Does this patient have ATRIAL FIBRILLATION?:[id number]} No BP recorded.  {Refresh Note OR Click here to enter BP  :1}***        Physical Exam    VS:  There were no vitals taken for this visit. , BMI There is no height or weight on file to calculate BMI.  GEN: Well nourished, well developed, in no acute distress. Neck: No JVD or carotid bruits. Cardiac: *** RRR. No murmurs. No rubs or gallops.   Respiratory:  Respirations regular and unlabored. Clear to auscultation without rales, wheezing or rhonchi. GI: Soft, nontender, nondistended. Extremities: Radials/DP/PT 2+ and equal bilaterally. No clubbing or cyanosis. No edema ***  Skin: Warm and dry, no rash. Neuro: Strength intact.  Assessment & Plan   ***  Disposition: ***     {Are you ordering a CV Procedure (e.g. stress test, cath, DCCV, TEE, etc)?   Press F2        :[id number]}   Signed, Etta Grandchild. Angus Amini, DNP, NP-C

## 2023-04-15 ENCOUNTER — Ambulatory Visit: Payer: Medicare HMO | Admitting: Student

## 2023-04-30 ENCOUNTER — Encounter: Payer: Self-pay | Admitting: Family Medicine

## 2023-04-30 ENCOUNTER — Ambulatory Visit (INDEPENDENT_AMBULATORY_CARE_PROVIDER_SITE_OTHER): Payer: Medicare HMO | Admitting: Family Medicine

## 2023-04-30 VITALS — BP 97/58 | HR 56 | Ht 61.42 in | Wt 173.0 lb

## 2023-04-30 DIAGNOSIS — I480 Paroxysmal atrial fibrillation: Secondary | ICD-10-CM

## 2023-04-30 DIAGNOSIS — J449 Chronic obstructive pulmonary disease, unspecified: Secondary | ICD-10-CM

## 2023-04-30 DIAGNOSIS — I10 Essential (primary) hypertension: Secondary | ICD-10-CM | POA: Diagnosis not present

## 2023-04-30 DIAGNOSIS — N1831 Chronic kidney disease, stage 3a: Secondary | ICD-10-CM

## 2023-04-30 DIAGNOSIS — L989 Disorder of the skin and subcutaneous tissue, unspecified: Secondary | ICD-10-CM

## 2023-04-30 DIAGNOSIS — I700 Atherosclerosis of aorta: Secondary | ICD-10-CM | POA: Diagnosis not present

## 2023-04-30 MED ORDER — MUPIROCIN 2 % EX OINT
TOPICAL_OINTMENT | Freq: Two times a day (BID) | CUTANEOUS | 0 refills | Status: DC
Start: 2023-04-30 — End: 2023-07-31

## 2023-04-30 NOTE — Assessment & Plan Note (Signed)
To recheck renal function and urine microalbumin.

## 2023-04-30 NOTE — Assessment & Plan Note (Signed)
BP at goal at home though low today.

## 2023-04-30 NOTE — Assessment & Plan Note (Signed)
Continue Crestor 

## 2023-04-30 NOTE — Assessment & Plan Note (Addendum)
Rate is controlled but more symptomatic with her atrial fibrillation.  We did discuss that I could make some changes to her medication today but then she mention she actually has a follow-up appointment in a couple of weeks with cardiology at the end of the month..  She had opted not to do anticoagulation most 2 years ago so she is not currently anticoagulated.

## 2023-04-30 NOTE — Progress Notes (Signed)
Established Patient Office Visit  Subjective   Patient ID: Rebekah Harrell, female    DOB: 1941/10/13  Age: 82 y.o. MRN: [id number]  Chief Complaint  Patient presents with   Hypertension   COPD    HPI  Hypertension- Pt denies chest pain, SOB, dizziness, or heart palpitations.  Taking meds as directed w/o problems.  Denies medication side effects.    F/U COPD - Some SOB with activities and some chest tightness.    She has some heaviness in her chest. Feels worse when she rolls over on the right side. Sometime notices at rest.   Gets dizzy when changes position from lying to sitting in the mornings.    Hx of parox afib - says feels like her HR is all over the placed. She is on sotalol and metoproolol   Has a skin lesion on the right shin that she would like me to look at today she noticed it about 2 weeks ago and it had a scab on it.  The scab peeled off but it just does not seem to be healing.  It is very itchy.    ROS    Objective:     BP (!) 97/58   Pulse (!) 56   Ht 5' 1.42" (1.56 m)   Wt 173 lb (78.5 kg)   SpO2 96%   BMI 32.25 kg/m    Physical Exam Vitals and nursing note reviewed.  Constitutional:      Appearance: She is well-developed.  HENT:     Head: Normocephalic and atraumatic.  Cardiovascular:     Rate and Rhythm: Normal rate. Rhythm irregular.     Heart sounds: Normal heart sounds.  Pulmonary:     Effort: Pulmonary effort is normal.     Breath sounds: Normal breath sounds.  Skin:    General: Skin is warm and dry.     Comments: Trace pitting edema. Also an erythematous papular lesion on the right anterior shin.    Neurological:     Mental Status: She is alert and oriented to person, place, and time.  Psychiatric:        Behavior: Behavior normal.    No results found for any visits on 04/30/23.    The ASCVD Risk score (Arnett DK, et al., 2019) failed to calculate for the following reasons:   The 2019 ASCVD risk score is only valid for  ages 70 to 67    Assessment & Plan:   Problem List Items Addressed This Visit       Cardiovascular and Mediastinum   HYPERTENSION, BENIGN ESSENTIAL - Primary    BP at goal at home though low today.        Relevant Orders   CBC with Differential/Platelet   COMPLETE METABOLIC PANEL WITH GFR   TSH   Urine Microalbumin w/creat. ratio   Atrial fibrillation (HCC)    Rate is controlled but more symptomatic with her atrial fibrillation.  We did discuss that I could make some changes to her medication today but then she mention she actually has a follow-up appointment in a couple of weeks with cardiology at the end of the month..  She had opted not to do anticoagulation most 2 years ago so she is not currently anticoagulated.      Relevant Orders   CBC with Differential/Platelet   COMPLETE METABOLIC PANEL WITH GFR   TSH   Urine Microalbumin w/creat. ratio   EKG 12-Lead   Aortic atherosclerosis (HCC)  Continue Crestor.        Respiratory   COPD (chronic obstructive pulmonary disease) (HCC)    Follows with pulm      Relevant Orders   CBC with Differential/Platelet   COMPLETE METABOLIC PANEL WITH GFR   TSH   Urine Microalbumin w/creat. ratio     Genitourinary   CKD (chronic kidney disease) stage 3, GFR 30-59 ml/min (HCC)    To recheck renal function and urine microalbumin.      Relevant Orders   CBC with Differential/Platelet   COMPLETE METABOLIC PANEL WITH GFR   TSH   Urine Microalbumin w/creat. ratio   Other Visit Diagnoses     Skin lesion of right leg       Relevant Medications   mupirocin ointment (BACTROBAN) 2 %      Skin lesion x 2 weeks, not healing.  Apply mupirocin ointment and if not healed in 2 more weeks then recommend shave biopsy to evaluate for possible squamous cell versus actinic keratosis  EKG today shows atrial fibrillation with rapid ventricular response.  Rate around 103 bpm.  Return in about 3 months (around 07/31/2023) for Hypertension.     Nani Gasser, MD

## 2023-04-30 NOTE — Assessment & Plan Note (Signed)
Follows with pulm

## 2023-05-01 ENCOUNTER — Encounter: Payer: Self-pay | Admitting: Family Medicine

## 2023-05-01 LAB — COMPLETE METABOLIC PANEL WITH GFR
AG Ratio: 1.7 (calc) (ref 1.0–2.5)
ALT: 10 U/L (ref 6–29)
AST: 15 U/L (ref 10–35)
Albumin: 4.3 g/dL (ref 3.6–5.1)
Alkaline phosphatase (APISO): 56 U/L (ref 37–153)
BUN/Creatinine Ratio: 19 (calc) (ref 6–22)
BUN: 18 mg/dL (ref 7–25)
CO2: 23 mmol/L (ref 20–32)
Calcium: 9.3 mg/dL (ref 8.6–10.4)
Chloride: 108 mmol/L (ref 98–110)
Creat: 0.97 mg/dL — ABNORMAL HIGH (ref 0.60–0.95)
Globulin: 2.6 g/dL (calc) (ref 1.9–3.7)
Glucose, Bld: 75 mg/dL (ref 65–99)
Potassium: 4.5 mmol/L (ref 3.5–5.3)
Sodium: 141 mmol/L (ref 135–146)
Total Bilirubin: 0.6 mg/dL (ref 0.2–1.2)
Total Protein: 6.9 g/dL (ref 6.1–8.1)
eGFR: 59 mL/min/{1.73_m2} — ABNORMAL LOW (ref >=60)

## 2023-05-01 LAB — MICROALBUMIN / CREATININE URINE RATIO
Creatinine, Urine: 214 mg/dL (ref 20–275)
Microalb Creat Ratio: 16 mg/g creat (ref <30)
Microalb, Ur: 3.4 mg/dL

## 2023-05-01 LAB — CBC WITH DIFFERENTIAL/PLATELET
Absolute Monocytes: 806 {cells}/uL (ref 200–950)
Basophils Absolute: 67 {cells}/uL (ref 0–200)
Basophils Relative: 0.8 %
Eosinophils Absolute: 143 {cells}/uL (ref 15–500)
Eosinophils Relative: 1.7 %
HCT: 42.8 % (ref 35.0–45.0)
Hemoglobin: 13.6 g/dL (ref 11.7–15.5)
Lymphs Abs: 2604 {cells}/uL (ref 850–3900)
MCH: 27.9 pg (ref 27.0–33.0)
MCHC: 31.8 g/dL — ABNORMAL LOW (ref 32.0–36.0)
MCV: 87.7 fL (ref 80.0–100.0)
MPV: 11.1 fL (ref 7.5–12.5)
Monocytes Relative: 9.6 %
Neutro Abs: 4780 {cells}/uL (ref 1500–7800)
Neutrophils Relative %: 56.9 %
Platelets: 269 Thousand/uL (ref 140–400)
RBC: 4.88 Million/uL (ref 3.80–5.10)
RDW: 14.3 % (ref 11.0–15.0)
Total Lymphocyte: 31 %
WBC: 8.4 Thousand/uL (ref 3.8–10.8)

## 2023-05-01 LAB — TSH: TSH: 2.4 mIU/L (ref 0.40–4.50)

## 2023-05-01 NOTE — Progress Notes (Signed)
Your lab work is within acceptable range and there are no concerning findings.   ?

## 2023-05-11 ENCOUNTER — Telehealth: Payer: Self-pay | Admitting: Cardiology

## 2023-05-11 NOTE — Telephone Encounter (Signed)
Spoke to patient she stated she has been in Afib for the past 1 month.B/P this morning 127/79 pulse 78.Stated heart rate has been over 100 at times.Appointment scheduled with Marjie Skiff PA 5/21 at 10:30 am.

## 2023-05-11 NOTE — H&P (View-Only) (Signed)
Cardiology Office Note:    Date:  05/12/2023   ID:  Rebekah Harrell, DOB [birth year], MRN [id number]  PCP:  Agapito Games, MD  Cardiologist:  Olga Millers, MD  Electrophysiologist:  None   Referring MD: Agapito Games, *   Chief Complaint: concerned she is back in atrial fibrillation  History of Present Illness:    Rebekah Harrell is a 82 y.o. female with a history of paroxysmal atrial fibrillation on Eliquis, chronic diastolic CHF, mild to moderate MR, hypertension, hyperlipidemia, CKD stage III, COPD, and GERD who is followed by Dr. Jens Som and presents today due to concerns that she is back in atrial fibrillation.  Patient was initial diagnosed with atrial fibrillation in 05/2016. She was admitted in 11/2022 with atrial fibrillation with RVR and acute diastolic CHF. Echo showed LVEF of 50-55% with normal wall motion and moderate LVH, mildly reduced RV function, and mild MR. She was started on Sotalol and converted to sinus rhythm. Last Echo in 03/2020 showed LVEF of 55-60% with normal wall motion and grade 2 diastolic dysfunction, normal RV function, moderate left atrial enlargement, and mild to moderate MR. She was last seen by Dr. Jens Som in 12/2021 at which time she reported dyspnea on exertion but was otherwise doing well from a cardiac standpoint. Of note, she discontinued Eliquis on her own due to risk of bleeding and expense.   Patient called our office on 05/11/2023 with concerns that she had been back in atrial fibrillation for the past month. Therefore, this visit was arranged for further evaluation.   Today, EKG shows patient is in atrial fibrillation with ventricular rate of 105 bpm.  She states she feels like she has been in atrial fibrillation for the past month.  She denies any significant palpitations but heart rate is elevated and reading as irregular on home BP machine.  Heart rate is normally in the 40s to 50s when in sinus rhythm but has mostly been in  the 90s to low 100s over the last few weeks.  She has chronic dyspnea on exertion which she has had for year but reports this has gotten progressively worse over the last year and describes dyspnea with activity such as vacuuming, walking to the mailbox, and taking out the garbage cans.  She states she had some of this a year ago but it is much more consistent now.  She also feels like her dyspnea has been worse while she has been in atrial fibrillation.  She describes some atypical chest heaviness while in atrial fibrillation that is reproducible with palpation of chest wall as well as some left shoulder and arm pain that she states feels like a pulled muscle.  It sounds like she will have this with activity after her heart rate increases and she becomes more short of breath.  She denies any orthopnea or PND but does report some mild lower extremity edema over the last couple weeks that is worse in the right leg.  She denies any calf pain or any recent travel or surgery.  She reports some mild lightheadedness and dizziness first thing in the morning when she goes from laying to standing but this resolves quickly she does not have any recurrent issues throughout the day.  No syncope.  Of note, she is not currently on any anticoagulation. She states she was briefly on Eliquis in 2017 but stopped this because of the cost and because she cut her leg and "almost bled to death." No recent  bleeding issues.   Past Medical History:  Diagnosis Date   Allergy    Aortic atherosclerosis (HCC)    Arthritis    "lower back; knees" (11/26/2016)   Asthma    Chronic atrial fibrillation (HCC)    CKD (chronic kidney disease), stage III (HCC)    patient unaware of this on 11/26/2016   Complication of anesthesia    "had trouble waking me up in the late 1990's after gallbladder OR"   COPD (chronic obstructive pulmonary disease) (HCC)    GERD (gastroesophageal reflux disease)    Heart murmur    "dx'd when I was a little  kid"   History of hiatal hernia    Hyperlipidemia    Hypertension    Hypoglycemia     Past Surgical History:  Procedure Laterality Date   CHOLECYSTECTOMY OPEN  1990s   TONSILLECTOMY     TUBAL LIGATION      Current Medications: Current Meds  Medication Sig   amLODipine (NORVASC) 10 MG tablet TAKE 1 TABLET EVERY DAY (MUST KEEP SCHEDULED APPT)   apixaban (ELIQUIS) 5 MG TABS tablet Take 1 tablet (5 mg total) by mouth 2 (two) times daily.   apixaban (ELIQUIS) 5 MG TABS tablet Take 1 tablet (5 mg total) by mouth 2 (two) times daily.   CALCIUM PO Take 1 tablet by mouth at bedtime.   Cholecalciferol 125 MCG (5000 UT) capsule Take 5,000 Units by mouth daily.   diltiazem (CARDIZEM) 30 MG tablet Take 1 tablet every 4 hours AS NEEDED for heart rate >100 as long as top bp >100   famotidine (PEPCID) 20 MG tablet TAKE 1 TABLET AT BEDTIME   fexofenadine (ALLEGRA) 180 MG tablet TAKE 1 TABLET EVERY DAY   furosemide (LASIX) 20 MG tablet Take 1 tablet (20 mg total) by mouth daily as needed. (Patient taking differently: Take 20 mg by mouth daily. Take 1 Tablet Daily)   hydrALAZINE (APRESOLINE) 50 MG tablet TAKE 1 TABLET THREE TIMES DAILY (Patient taking differently: Take 50 mg by mouth 2 (two) times daily.)   hydroxychloroquine (PLAQUENIL) 200 MG tablet TAKE 1 TABLET EVERY DAY   Magnesium 200 MG TABS Take 1 tablet (200 mg total) by mouth daily.   montelukast (SINGULAIR) 10 MG tablet TAKE 1 TABLET AT BEDTIME   Multiple Vitamins-Minerals (MULTIVITAMIN PO) Take 1 tablet by mouth at bedtime.   mupirocin ointment (BACTROBAN) 2 % Apply topically 2 (two) times daily.   omeprazole (PRILOSEC) 20 MG capsule TAKE 1 CAPSULE EVERY DAY   potassium chloride SA (KLOR-CON M) 20 MEQ tablet TAKE 1 TABLET EVERY DAY   rosuvastatin (CRESTOR) 10 MG tablet Take 1 tablet (10 mg total) by mouth daily.   sotalol (BETAPACE) 80 MG tablet TAKE 1 TABLET THREE TIMES DAILY (Patient taking differently: Take 80 mg by mouth 2 (two) times  daily.)   SYMBICORT 160-4.5 MCG/ACT inhaler Inhale 2 puffs into the lungs in the morning and at bedtime.   Zinc 50 MG TABS Take 1 tablet by mouth daily.     Allergies:   Ace inhibitors, Tramadol, Atorvastatin, Simvastatin, Celecoxib, Cetirizine hcl, Penicillins, and Sulfonamide derivatives   Social History   Socioeconomic History   Marital status: Divorced    Spouse name: Not on file   Number of children: 3   Years of education: 14   Highest education level: Some college, no degree  Occupational History   Occupation: stages houses for sale    Comment: self employed   Occupation: Retired.  Tobacco Use  Smoking status: Never    Passive exposure: Past   Smokeless tobacco: Never  Vaping Use   Vaping Use: Never used  Substance and Sexual Activity   Alcohol use: Not Currently   Drug use: No   Sexual activity: Not Currently  Other Topics Concern   Not on file  Social History Narrative   Lives alone. One son lives close by incase she needs anything. She enjoys crafting and decorating.    Social Determinants of Health   Financial Resource Strain: Medium Risk (04/28/2023)   Overall Financial Resource Strain (CARDIA)    Difficulty of Paying Living Expenses: Somewhat hard  Food Insecurity: No Food Insecurity (04/28/2023)   Hunger Vital Sign    Worried About Running Out of Food in the Last Year: Never true    Ran Out of Food in the Last Year: Never true  Transportation Needs: No Transportation Needs (04/28/2023)   PRAPARE - Administrator, Civil Service (Medical): No    Lack of Transportation (Non-Medical): No  Physical Activity: Inactive (04/28/2023)   Exercise Vital Sign    Days of Exercise per Week: 0 days    Minutes of Exercise per Session: 0 min  Stress: Stress Concern Present (04/28/2023)   [employer] of Occupational Health - Occupational Stress Questionnaire    Feeling of Stress : Rather much  Social Connections: Socially Isolated (04/28/2023)   Social  Connection and Isolation Panel [NHANES]    Frequency of Communication with Friends and Family: Three times a week    Frequency of Social Gatherings with Friends and Family: Patient declined    Attends Religious Services: Never    Database administrator or Organizations: No    Attends Engineer, structural: Never    Marital Status: Divorced     Family History: The patient's family history includes Atrial fibrillation in her brother; CAD in her mother; Colon cancer in her father; Emphysema in her maternal grandfather; Healthy in her brother; Heart attack in her paternal grandmother; Heart failure in her maternal grandmother; Kidney failure in her maternal grandfather.  ROS:   Please see the history of present illness.     EKGs/Labs/Other Studies Reviewed:    The following studies were reviewed:  Echocardiogram 04/11/2020: Impressions:  1. Left ventricular ejection fraction, by estimation, is 55 to 60%. The  left ventricle has normal function. The left ventricle has no regional  wall motion abnormalities. Left ventricular diastolic parameters are  consistent with Grade II diastolic  dysfunction (pseudonormalization).   2. Right ventricular systolic function is normal. The right ventricular  size is normal. There is mildly elevated pulmonary artery systolic  pressure.   3. Left atrial size was moderately dilated.   4. The mitral valve is normal in structure. Mild to moderate mitral valve  regurgitation. No evidence of mitral stenosis.   5. The aortic valve is normal in structure. Aortic valve regurgitation is  not visualized. No aortic stenosis is present.   6. The inferior vena cava is normal in size with greater than 50%  respiratory variability, suggesting right atrial pressure of 3 mmHg.   7. Compared to echocardiogram from 2017 MR from mild now is mild to  moderate, LA size increased to moderate enlargement from mild. Disatolic  dysfunction.   EKG:  EKG ordered today.  EKG personally reviewed and demonstrates atrial fibrillation, 105 bpm, with PVC and non-specific T wave changes..  Recent Labs: 08/27/2022: Brain Natriuretic Peptide 144 04/30/2023: ALT 10; BUN 18; Creat  0.97; Hemoglobin 13.6; Platelets 269; Potassium 4.5; Sodium 141; TSH 2.40  Recent Lipid Panel    Component Value Date/Time   CHOL 154 08/27/2022 1218   CHOL 148 11/18/2017 1205   TRIG 215 (H) 08/27/2022 1218   HDL 47 (L) 08/27/2022 1218   HDL 38 (L) 11/18/2017 1205   CHOLHDL 3.3 08/27/2022 1218   VLDL 26 06/19/2016 1136   LDLCALC 76 08/27/2022 1218    Physical Exam:    Vital Signs: BP 110/78 (BP Location: Left Arm, Patient Position: Sitting, Cuff Size: Normal)   Ht 5\' 1"  (1.549 m)   Wt 172 lb 3.2 oz (78.1 kg)   BMI 32.54 kg/m     Wt Readings from Last 3 Encounters:  05/12/23 172 lb 3.2 oz (78.1 kg)  04/30/23 173 lb (78.5 kg)  02/03/23 176 lb (79.8 kg)     General: 82 y.o. Caucasian female in no acute distress. HEENT: Normocephalic and atraumatic. Sclera clear.  Neck: Supple. No JVD. Heart: RRR. Distinct S1 and S2. No murmurs, gallops, or rubs.  Lungs: No increased work of breathing. Clear to ausculation bilaterally. No wheezes, rhonchi, or rales.  Abdomen: Soft, non-distended, and non-tender to palpation.  Extremities: 1+ pitting edema of right lower extremity and trace edema of left lower extremity.  Skin: Warm and dry. Neuro: Alert and oriented x3. No focal deficits. Psych: Normal affect. Responds appropriately.  Assessment:    1. Persistent atrial fibrillation (HCC)   2. Dyspnea on exertion   3. Chronic diastolic CHF (congestive heart failure) (HCC)   4. Chest discomfort   5. Mitral valve insufficiency, unspecified etiology   6. Primary hypertension   7. Hyperlipidemia, unspecified hyperlipidemia type   8. Stage 3a chronic kidney disease (HCC)     Plan:    Persistent Atrial Fibrillation Initially diagnosed in 2017 and has been on Sotalol. She has done very  well on the Sotalol until recently. She feels like she has been back in atrial fibrillation for the last month and has had some increased dyspnea and decreased energy with this. EKG at PCPs office on 04/30/2023 showed atrial fibrillation. TSH at that time was normal.  - EKG today shows atrial fibrillation with ventricular rate of 105 bpm. - Continue Sotalol 80mg  twice daily.  - Hesitant to add beta-blocker or long acting Diltiazem because she is usually bradycardic when in sinus rhythm.  - Can continued to take Diltiazem 30mg  every 4 hours as needed for heart rates sustaining >100 bpm. Of note, she is also on Amlodipine but given she does not take take Diltiazem consistently, think this is OK.  - CHA2DS2-VASc = 5 (CHF, HTN,  age x2, gender) indicating a 7.2% annual risk of stroke. She has not been on anticoagulation. Will start Eliquis 5mg  twice daily. Provided 2 weeks of samples and provided patient assistance forms.  - Will plan for DCCV after 3 weeks of uninterrupted anticoagulation. Scheduled this for 06/04/2023 with Dr. Mayford Knife. Will check CBC and BMP in 1 week.   Shared Decision Making/Informed Consent The risks (stroke, cardiac arrhythmias rarely resulting in the need for a temporary or permanent pacemaker, skin irritation or burns and complications associated with conscious sedation including aspiration, arrhythmia, respiratory failure and death), benefits (restoration of normal sinus rhythm) and alternatives of a direct current cardioversion were explained in detail to Ms. Leight and she agrees to proceed.   Dyspnea on Exertion Chronic Diastolic CHF Last Echo in 03/2020 showed LVEF of 55-60% with normal wall motion and grade 2  diastolic dysfunction, normal RV function, moderate left atrial enlargement, and mild to moderate MR. Patient has chronic dyspnea on exertion mostly due to underlying COPD but has report progression of this over the last year especially over the last month while back in  atrial fibrillation. She also reports mild lower extremity edema over the last couple of weeks. - Mild lower extremity edema on exam (right leg slightly larger than the left leg) but otherwise euvolemic.  - Will update Echo. - Will increase Lasix from 20mg  PRN to daily.  - Advised patient to let us know if lower extremity does not improve on increase Lasix and right leg remains larger than the left. Would like get lower extremity venous dopplers at that point. - Will check BMET and BNP in one week.   Chest Discomfort  Patient describes chest heaviness as well as some left shoulder and arm pain since being back in atrial fibrillation. The chest pain is atypical and reproducible with palpation of chest wall. - EKG shows non-specific T wave changes.  - Suspect symptoms are secondary to atrial fibrillation and possible volume overloaded. There also seem to be a musculoskeletal component. Will hold off on any ischemic evaluation at this time and see how she does with restoration of sinus rhythm. However, discussed ED precautions.    Mild to Moderate MR Noted on Echo in 03/2020.  - Will repeat Echo for routine monitoring.   Hypertension BP well controlled.  - Continue current medications: Amlodipine 10mg  daily and Hydralazine 50mg  twice times daily.   Hyperlipidemia Lipid panel in 08/2022: Total Cholesterol 154, Triglycerides 215, HDL 47, LDL 76.  -  Continue Crestor 10mg  daily  - Labs followed by PCP.  CKD Stage III Baseline creatinine around 0.9 to 1.2. Stable at 0.97 on recent labs on 04/30/2023.   Disposition: Follow up in 4-6 weeks after cardioversion.    Medication Adjustments/Labs and Tests Ordered: Current medicines are reviewed at length with the patient today.  Concerns regarding medicines are outlined above.  Orders Placed This Encounter  Procedures   CBC   Basic metabolic panel   Brain natriuretic peptide   EKG 12-Lead   ECHOCARDIOGRAM COMPLETE   Meds ordered this encounter   Medications   apixaban (ELIQUIS) 5 MG TABS tablet    Sig: Take 1 tablet (5 mg total) by mouth 2 (two) times daily.    Dispense:  28 tablet    Refill:  0   apixaban (ELIQUIS) 5 MG TABS tablet    Sig: Take 1 tablet (5 mg total) by mouth 2 (two) times daily.    Dispense:  60 tablet    Refill:  2    Patient Instructions  Medication Instructions:  Increase Lasix 20 mg ( Take 1 Tablet Daily).  *If you need a refill on your cardiac medications before your next appointment, please call your pharmacy*   Lab Work: BMET, BNP, CBC To Be Done 1 Week If you have labs (blood work) drawn today and your tests are completely normal, you will receive your results only by: MyChart Message (if you have MyChart) OR A paper copy in the mail If you have any lab test that is abnormal or we need to change your treatment, we will call you to review the results.   Testing/Procedures: [address]. Your physician has requested that you have an echocardiogram. Echocardiography is a painless test that uses sound waves to create images of your heart. It provides your doctor with  information about the size and shape of your heart and how well your heart's chambers and valves are working. This procedure takes approximately one hour. There are no restrictions for this procedure. Please do NOT wear cologne, perfume, aftershave, or lotions (deodorant is allowed). Please arrive 15 minutes prior to your appointment time.        Dear [name]  You are scheduled for a Cardioversion on Thursday, June 13 with Dr. Armanda Magic.  Please arrive at the [facility] (Main Entrance A) at [facility]: [address] [place], [place] [address] at 7:30 AM (This time is 1 hour(s) before your procedure to ensure your preparation). Free valet parking service is available. You will check in at ADMITTING. The support person will be asked to wait in the waiting room.  It is OK to have someone drop  you off and come back when you are ready to be discharged.    Nothing to eat or drink after midnight except a sip of water with medications (see medication instructions below)    LABS:   Come to [facility] [address], [address]  FYI:  For your safety, and to allow [place] to monitor your vital signs accurately during the surgery/procedure we request: If you have artificial nails, gel coating, SNS etc, please have those removed prior to your surgery/procedure. Not having the nail coverings /polish removed may result in cancellation or delay of your surgery/procedure.  You must have a responsible person to drive you home and stay in the waiting area during your procedure. Failure to do so could result in cancellation.  Bring your insurance cards.  *Special Note: Every effort is made to have your procedure done on time. Occasionally there are emergencies that occur at the hospital that may cause delays. Please be patient if a delay does occur.      Follow-Up: At [facility], you and your health needs are our priority.  As part of our continuing mission to provide you with exceptional heart care, we have created designated Provider Care Teams.  These Care Teams include your primary Cardiologist (physician) and Advanced Practice Providers (APPs -  Physician Assistants and Nurse Practitioners) who all work together to provide you with the care you need, when you need it.  We recommend signing up for the patient portal called "MyChart".  Sign up information is provided on this After Visit Summary.  MyChart is used to connect with patients for Virtual Visits (Telemedicine).  Patients are able to view lab/test results, encounter notes, upcoming appointments, etc.  Non-urgent messages can be sent to your provider as well.   To learn more about what you can do with MyChart, go to [email or web address].    Your next appointment:   3-4 weeks Post Cardioversion  Provider:   Marjie Skiff, PA-C       Signed, Corrin Parker, PA-C  05/12/2023 12:53 PM    [facility]

## 2023-05-11 NOTE — Progress Notes (Unsigned)
Cardiology Office Note:    Date:  05/12/2023   ID:  Rebekah Harrell, DOB [birth year], MRN [id number]  PCP:  Agapito Games, MD  Cardiologist:  Olga Millers, MD  Electrophysiologist:  None   Referring MD: Agapito Games, *   Chief Complaint: concerned she is back in atrial fibrillation  History of Present Illness:    Rebekah Harrell is a 82 y.o. female with a history of paroxysmal atrial fibrillation on Eliquis, chronic diastolic CHF, mild to moderate MR, hypertension, hyperlipidemia, CKD stage III, COPD, and GERD who is followed by Dr. Jens Som and presents today due to concerns that she is back in atrial fibrillation.  Patient was initial diagnosed with atrial fibrillation in 05/2016. She was admitted in 11/2022 with atrial fibrillation with RVR and acute diastolic CHF. Echo showed LVEF of 50-55% with normal wall motion and moderate LVH, mildly reduced RV function, and mild MR. She was started on Sotalol and converted to sinus rhythm. Last Echo in 03/2020 showed LVEF of 55-60% with normal wall motion and grade 2 diastolic dysfunction, normal RV function, moderate left atrial enlargement, and mild to moderate MR. She was last seen by Dr. Jens Som in 12/2021 at which time she reported dyspnea on exertion but was otherwise doing well from a cardiac standpoint. Of note, she discontinued Eliquis on her own due to risk of bleeding and expense.   Patient called our office on 05/11/2023 with concerns that she had been back in atrial fibrillation for the past month. Therefore, this visit was arranged for further evaluation.   Today, EKG shows patient is in atrial fibrillation with ventricular rate of 105 bpm.  She states she feels like she has been in atrial fibrillation for the past month.  She denies any significant palpitations but heart rate is elevated and reading as irregular on home BP machine.  Heart rate is normally in the 40s to 50s when in sinus rhythm but has mostly been in  the 90s to low 100s over the last few weeks.  She has chronic dyspnea on exertion which she has had for year but reports this has gotten progressively worse over the last year and describes dyspnea with activity such as vacuuming, walking to the mailbox, and taking out the garbage cans.  She states she had some of this a year ago but it is much more consistent now.  She also feels like her dyspnea has been worse while she has been in atrial fibrillation.  She describes some atypical chest heaviness while in atrial fibrillation that is reproducible with palpation of chest wall as well as some left shoulder and arm pain that she states feels like a pulled muscle.  It sounds like she will have this with activity after her heart rate increases and she becomes more short of breath.  She denies any orthopnea or PND but does report some mild lower extremity edema over the last couple weeks that is worse in the right leg.  She denies any calf pain or any recent travel or surgery.  She reports some mild lightheadedness and dizziness first thing in the morning when she goes from laying to standing but this resolves quickly she does not have any recurrent issues throughout the day.  No syncope.  Of note, she is not currently on any anticoagulation. She states she was briefly on Eliquis in 2017 but stopped this because of the cost and because she cut her leg and "almost bled to death." No recent  bleeding issues.   Past Medical History:  Diagnosis Date   Allergy    Aortic atherosclerosis (HCC)    Arthritis    "lower back; knees" (11/26/2016)   Asthma    Chronic atrial fibrillation (HCC)    CKD (chronic kidney disease), stage III (HCC)    patient unaware of this on 11/26/2016   Complication of anesthesia    "had trouble waking me up in the late 1990's after gallbladder OR"   COPD (chronic obstructive pulmonary disease) (HCC)    GERD (gastroesophageal reflux disease)    Heart murmur    "dx'd when I was a little  kid"   History of hiatal hernia    Hyperlipidemia    Hypertension    Hypoglycemia     Past Surgical History:  Procedure Laterality Date   CHOLECYSTECTOMY OPEN  1990s   TONSILLECTOMY     TUBAL LIGATION      Current Medications: Current Meds  Medication Sig   amLODipine (NORVASC) 10 MG tablet TAKE 1 TABLET EVERY DAY (MUST KEEP SCHEDULED APPT)   apixaban (ELIQUIS) 5 MG TABS tablet Take 1 tablet (5 mg total) by mouth 2 (two) times daily.   apixaban (ELIQUIS) 5 MG TABS tablet Take 1 tablet (5 mg total) by mouth 2 (two) times daily.   CALCIUM PO Take 1 tablet by mouth at bedtime.   Cholecalciferol 125 MCG (5000 UT) capsule Take 5,000 Units by mouth daily.   diltiazem (CARDIZEM) 30 MG tablet Take 1 tablet every 4 hours AS NEEDED for heart rate >100 as long as top bp >100   famotidine (PEPCID) 20 MG tablet TAKE 1 TABLET AT BEDTIME   fexofenadine (ALLEGRA) 180 MG tablet TAKE 1 TABLET EVERY DAY   furosemide (LASIX) 20 MG tablet Take 1 tablet (20 mg total) by mouth daily as needed. (Patient taking differently: Take 20 mg by mouth daily. Take 1 Tablet Daily)   hydrALAZINE (APRESOLINE) 50 MG tablet TAKE 1 TABLET THREE TIMES DAILY (Patient taking differently: Take 50 mg by mouth 2 (two) times daily.)   hydroxychloroquine (PLAQUENIL) 200 MG tablet TAKE 1 TABLET EVERY DAY   Magnesium 200 MG TABS Take 1 tablet (200 mg total) by mouth daily.   montelukast (SINGULAIR) 10 MG tablet TAKE 1 TABLET AT BEDTIME   Multiple Vitamins-Minerals (MULTIVITAMIN PO) Take 1 tablet by mouth at bedtime.   mupirocin ointment (BACTROBAN) 2 % Apply topically 2 (two) times daily.   omeprazole (PRILOSEC) 20 MG capsule TAKE 1 CAPSULE EVERY DAY   potassium chloride SA (KLOR-CON M) 20 MEQ tablet TAKE 1 TABLET EVERY DAY   rosuvastatin (CRESTOR) 10 MG tablet Take 1 tablet (10 mg total) by mouth daily.   sotalol (BETAPACE) 80 MG tablet TAKE 1 TABLET THREE TIMES DAILY (Patient taking differently: Take 80 mg by mouth 2 (two) times  daily.)   SYMBICORT 160-4.5 MCG/ACT inhaler Inhale 2 puffs into the lungs in the morning and at bedtime.   Zinc 50 MG TABS Take 1 tablet by mouth daily.     Allergies:   Ace inhibitors, Tramadol, Atorvastatin, Simvastatin, Celecoxib, Cetirizine hcl, Penicillins, and Sulfonamide derivatives   Social History   Socioeconomic History   Marital status: Divorced    Spouse name: Not on file   Number of children: 3   Years of education: 14   Highest education level: Some college, no degree  Occupational History   Occupation: stages houses for sale    Comment: self employed   Occupation: Retired.  Tobacco Use  Smoking status: Never    Passive exposure: Past   Smokeless tobacco: Never  Vaping Use   Vaping Use: Never used  Substance and Sexual Activity   Alcohol use: Not Currently   Drug use: No   Sexual activity: Not Currently  Other Topics Concern   Not on file  Social History Narrative   Lives alone. One son lives close by incase she needs anything. She enjoys crafting and decorating.    Social Determinants of Health   Financial Resource Strain: Medium Risk (04/28/2023)   Overall Financial Resource Strain (CARDIA)    Difficulty of Paying Living Expenses: Somewhat hard  Food Insecurity: No Food Insecurity (04/28/2023)   Hunger Vital Sign    Worried About Running Out of Food in the Last Year: Never true    Ran Out of Food in the Last Year: Never true  Transportation Needs: No Transportation Needs (04/28/2023)   PRAPARE - Administrator, Civil Service (Medical): No    Lack of Transportation (Non-Medical): No  Physical Activity: Inactive (04/28/2023)   Exercise Vital Sign    Days of Exercise per Week: 0 days    Minutes of Exercise per Session: 0 min  Stress: Stress Concern Present (04/28/2023)   [employer] of Occupational Health - Occupational Stress Questionnaire    Feeling of Stress : Rather much  Social Connections: Socially Isolated (04/28/2023)   Social  Connection and Isolation Panel [NHANES]    Frequency of Communication with Friends and Family: Three times a week    Frequency of Social Gatherings with Friends and Family: Patient declined    Attends Religious Services: Never    Database administrator or Organizations: No    Attends Engineer, structural: Never    Marital Status: Divorced     Family History: The patient's family history includes Atrial fibrillation in her brother; CAD in her mother; Colon cancer in her father; Emphysema in her maternal grandfather; Healthy in her brother; Heart attack in her paternal grandmother; Heart failure in her maternal grandmother; Kidney failure in her maternal grandfather.  ROS:   Please see the history of present illness.     EKGs/Labs/Other Studies Reviewed:    The following studies were reviewed:  Echocardiogram 04/11/2020: Impressions:  1. Left ventricular ejection fraction, by estimation, is 55 to 60%. The  left ventricle has normal function. The left ventricle has no regional  wall motion abnormalities. Left ventricular diastolic parameters are  consistent with Grade II diastolic  dysfunction (pseudonormalization).   2. Right ventricular systolic function is normal. The right ventricular  size is normal. There is mildly elevated pulmonary artery systolic  pressure.   3. Left atrial size was moderately dilated.   4. The mitral valve is normal in structure. Mild to moderate mitral valve  regurgitation. No evidence of mitral stenosis.   5. The aortic valve is normal in structure. Aortic valve regurgitation is  not visualized. No aortic stenosis is present.   6. The inferior vena cava is normal in size with greater than 50%  respiratory variability, suggesting right atrial pressure of 3 mmHg.   7. Compared to echocardiogram from 2017 MR from mild now is mild to  moderate, LA size increased to moderate enlargement from mild. Disatolic  dysfunction.   EKG:  EKG ordered today.  EKG personally reviewed and demonstrates atrial fibrillation, 105 bpm, with PVC and non-specific T wave changes..  Recent Labs: 08/27/2022: Brain Natriuretic Peptide 144 04/30/2023: ALT 10; BUN 18; Creat  0.97; Hemoglobin 13.6; Platelets 269; Potassium 4.5; Sodium 141; TSH 2.40  Recent Lipid Panel    Component Value Date/Time   CHOL 154 08/27/2022 1218   CHOL 148 11/18/2017 1205   TRIG 215 (H) 08/27/2022 1218   HDL 47 (L) 08/27/2022 1218   HDL 38 (L) 11/18/2017 1205   CHOLHDL 3.3 08/27/2022 1218   VLDL 26 06/19/2016 1136   LDLCALC 76 08/27/2022 1218    Physical Exam:    Vital Signs: BP 110/78 (BP Location: Left Arm, Patient Position: Sitting, Cuff Size: Normal)   Ht 5\' 1"  (1.549 m)   Wt 172 lb 3.2 oz (78.1 kg)   BMI 32.54 kg/m     Wt Readings from Last 3 Encounters:  05/12/23 172 lb 3.2 oz (78.1 kg)  04/30/23 173 lb (78.5 kg)  02/03/23 176 lb (79.8 kg)     General: 82 y.o. Caucasian female in no acute distress. HEENT: Normocephalic and atraumatic. Sclera clear.  Neck: Supple. No JVD. Heart: RRR. Distinct S1 and S2. No murmurs, gallops, or rubs.  Lungs: No increased work of breathing. Clear to ausculation bilaterally. No wheezes, rhonchi, or rales.  Abdomen: Soft, non-distended, and non-tender to palpation.  Extremities: 1+ pitting edema of right lower extremity and trace edema of left lower extremity.  Skin: Warm and dry. Neuro: Alert and oriented x3. No focal deficits. Psych: Normal affect. Responds appropriately.  Assessment:    1. Persistent atrial fibrillation (HCC)   2. Dyspnea on exertion   3. Chronic diastolic CHF (congestive heart failure) (HCC)   4. Chest discomfort   5. Mitral valve insufficiency, unspecified etiology   6. Primary hypertension   7. Hyperlipidemia, unspecified hyperlipidemia type   8. Stage 3a chronic kidney disease (HCC)     Plan:    Persistent Atrial Fibrillation Initially diagnosed in 2017 and has been on Sotalol. She has done very  well on the Sotalol until recently. She feels like she has been back in atrial fibrillation for the last month and has had some increased dyspnea and decreased energy with this. EKG at PCPs office on 04/30/2023 showed atrial fibrillation. TSH at that time was normal.  - EKG today shows atrial fibrillation with ventricular rate of 105 bpm. - Continue Sotalol 80mg  twice daily.  - Hesitant to add beta-blocker or long acting Diltiazem because she is usually bradycardic when in sinus rhythm.  - Can continued to take Diltiazem 30mg  every 4 hours as needed for heart rates sustaining >100 bpm. Of note, she is also on Amlodipine but given she does not take take Diltiazem consistently, think this is OK.  - CHA2DS2-VASc = 5 (CHF, HTN,  age x2, gender) indicating a 7.2% annual risk of stroke. She has not been on anticoagulation. Will start Eliquis 5mg  twice daily. Provided 2 weeks of samples and provided patient assistance forms.  - Will plan for DCCV after 3 weeks of uninterrupted anticoagulation. Scheduled this for 06/04/2023 with Dr. Mayford Knife. Will check CBC and BMP in 1 week.   Shared Decision Making/Informed Consent The risks (stroke, cardiac arrhythmias rarely resulting in the need for a temporary or permanent pacemaker, skin irritation or burns and complications associated with conscious sedation including aspiration, arrhythmia, respiratory failure and death), benefits (restoration of normal sinus rhythm) and alternatives of a direct current cardioversion were explained in detail to Rebekah Harrell and she agrees to proceed.   Dyspnea on Exertion Chronic Diastolic CHF Last Echo in 03/2020 showed LVEF of 55-60% with normal wall motion and grade 2  diastolic dysfunction, normal RV function, moderate left atrial enlargement, and mild to moderate MR. Patient has chronic dyspnea on exertion mostly due to underlying COPD but has report progression of this over the last year especially over the last month while back in  atrial fibrillation. She also reports mild lower extremity edema over the last couple of weeks. - Mild lower extremity edema on exam (right leg slightly larger than the left leg) but otherwise euvolemic.  - Will update Echo. - Will increase Lasix from 20mg  PRN to daily.  - Advised patient to let us know if lower extremity does not improve on increase Lasix and right leg remains larger than the left. Would like get lower extremity venous dopplers at that point. - Will check BMET and BNP in one week.   Chest Discomfort  Patient describes chest heaviness as well as some left shoulder and arm pain since being back in atrial fibrillation. The chest pain is atypical and reproducible with palpation of chest wall. - EKG shows non-specific T wave changes.  - Suspect symptoms are secondary to atrial fibrillation and possible volume overloaded. There also seem to be a musculoskeletal component. Will hold off on any ischemic evaluation at this time and see how she does with restoration of sinus rhythm. However, discussed ED precautions.    Mild to Moderate MR Noted on Echo in 03/2020.  - Will repeat Echo for routine monitoring.   Hypertension BP well controlled.  - Continue current medications: Amlodipine 10mg  daily and Hydralazine 50mg  twice times daily.   Hyperlipidemia Lipid panel in 08/2022: Total Cholesterol 154, Triglycerides 215, HDL 47, LDL 76.  -  Continue Crestor 10mg  daily  - Labs followed by PCP.  CKD Stage III Baseline creatinine around 0.9 to 1.2. Stable at 0.97 on recent labs on 04/30/2023.   Disposition: Follow up in 4-6 weeks after cardioversion.    Medication Adjustments/Labs and Tests Ordered: Current medicines are reviewed at length with the patient today.  Concerns regarding medicines are outlined above.  Orders Placed This Encounter  Procedures   CBC   Basic metabolic panel   Brain natriuretic peptide   EKG 12-Lead   ECHOCARDIOGRAM COMPLETE   Meds ordered this encounter   Medications   apixaban (ELIQUIS) 5 MG TABS tablet    Sig: Take 1 tablet (5 mg total) by mouth 2 (two) times daily.    Dispense:  28 tablet    Refill:  0   apixaban (ELIQUIS) 5 MG TABS tablet    Sig: Take 1 tablet (5 mg total) by mouth 2 (two) times daily.    Dispense:  60 tablet    Refill:  2    Patient Instructions  Medication Instructions:  Increase Lasix 20 mg ( Take 1 Tablet Daily).  *If you need a refill on your cardiac medications before your next appointment, please call your pharmacy*   Lab Work: BMET, BNP, CBC To Be Done 1 Week If you have labs (blood work) drawn today and your tests are completely normal, you will receive your results only by: MyChart Message (if you have MyChart) OR A paper copy in the mail If you have any lab test that is abnormal or we need to change your treatment, we will call you to review the results.   Testing/Procedures: [address]. Your physician has requested that you have an echocardiogram. Echocardiography is a painless test that uses sound waves to create images of your heart. It provides your doctor with  information about the size and shape of your heart and how well your heart's chambers and valves are working. This procedure takes approximately one hour. There are no restrictions for this procedure. Please do NOT wear cologne, perfume, aftershave, or lotions (deodorant is allowed). Please arrive 15 minutes prior to your appointment time.        Dear [name]  You are scheduled for a Cardioversion on Thursday, June 13 with Dr. Armanda Magic.  Please arrive at the [facility] (Main Entrance A) at [facility]: [address] [place], [place] [address] at 7:30 AM (This time is 1 hour(s) before your procedure to ensure your preparation). Free valet parking service is available. You will check in at ADMITTING. The support person will be asked to wait in the waiting room.  It is OK to have someone drop  you off and come back when you are ready to be discharged.    Nothing to eat or drink after midnight except a sip of water with medications (see medication instructions below)    LABS:   Come to [facility] [address], [address]  FYI:  For your safety, and to allow [place] to monitor your vital signs accurately during the surgery/procedure we request: If you have artificial nails, gel coating, SNS etc, please have those removed prior to your surgery/procedure. Not having the nail coverings /polish removed may result in cancellation or delay of your surgery/procedure.  You must have a responsible person to drive you home and stay in the waiting area during your procedure. Failure to do so could result in cancellation.  Bring your insurance cards.  *Special Note: Every effort is made to have your procedure done on time. Occasionally there are emergencies that occur at the hospital that may cause delays. Please be patient if a delay does occur.      Follow-Up: At [facility], you and your health needs are our priority.  As part of our continuing mission to provide you with exceptional heart care, we have created designated Provider Care Teams.  These Care Teams include your primary Cardiologist (physician) and Advanced Practice Providers (APPs -  Physician Assistants and Nurse Practitioners) who all work together to provide you with the care you need, when you need it.  We recommend signing up for the patient portal called "MyChart".  Sign up information is provided on this After Visit Summary.  MyChart is used to connect with patients for Virtual Visits (Telemedicine).  Patients are able to view lab/test results, encounter notes, upcoming appointments, etc.  Non-urgent messages can be sent to your provider as well.   To learn more about what you can do with MyChart, go to [email or web address].    Your next appointment:   3-4 weeks Post Cardioversion  Provider:   Marjie Skiff, PA-C       Signed, Corrin Parker, PA-C  05/12/2023 12:53 PM    [facility]

## 2023-05-11 NOTE — Telephone Encounter (Signed)
Patient returned RN's call. 

## 2023-05-12 ENCOUNTER — Encounter: Payer: Self-pay | Admitting: Student

## 2023-05-12 ENCOUNTER — Ambulatory Visit: Payer: Medicare HMO | Attending: Student | Admitting: Student

## 2023-05-12 VITALS — BP 110/78 | Ht 61.0 in | Wt 172.2 lb

## 2023-05-12 DIAGNOSIS — I4819 Other persistent atrial fibrillation: Secondary | ICD-10-CM

## 2023-05-12 DIAGNOSIS — I10 Essential (primary) hypertension: Secondary | ICD-10-CM | POA: Diagnosis not present

## 2023-05-12 DIAGNOSIS — E785 Hyperlipidemia, unspecified: Secondary | ICD-10-CM

## 2023-05-12 DIAGNOSIS — R0789 Other chest pain: Secondary | ICD-10-CM | POA: Diagnosis not present

## 2023-05-12 DIAGNOSIS — I5032 Chronic diastolic (congestive) heart failure: Secondary | ICD-10-CM

## 2023-05-12 DIAGNOSIS — I340 Nonrheumatic mitral (valve) insufficiency: Secondary | ICD-10-CM | POA: Diagnosis not present

## 2023-05-12 DIAGNOSIS — N1831 Chronic kidney disease, stage 3a: Secondary | ICD-10-CM | POA: Diagnosis not present

## 2023-05-12 DIAGNOSIS — R0609 Other forms of dyspnea: Secondary | ICD-10-CM | POA: Diagnosis not present

## 2023-05-12 MED ORDER — APIXABAN 5 MG PO TABS: 5.0000 mg | ORAL_TABLET | Freq: Two times a day (BID) | ORAL | 0 refills | Status: DC

## 2023-05-12 MED ORDER — APIXABAN 5 MG PO TABS: 5.0000 mg | ORAL_TABLET | Freq: Two times a day (BID) | ORAL | 2 refills | Status: DC

## 2023-05-12 NOTE — Patient Instructions (Addendum)
Medication Instructions:  Increase Lasix 20 mg ( Take 1 Tablet Daily).  *If you need a refill on your cardiac medications before your next appointment, please call your pharmacy*   Lab Work: BMET, BNP, CBC To Be Done 1 Week If you have labs (blood work) drawn today and your tests are completely normal, you will receive your results only by: MyChart Message (if you have MyChart) OR A paper copy in the mail If you have any lab test that is abnormal or we need to change your treatment, we will call you to review the results.   Testing/Procedures: [address]. Your physician has requested that you have an echocardiogram. Echocardiography is a painless test that uses sound waves to create images of your heart. It provides your doctor with information about the size and shape of your heart and how well your heart's chambers and valves are working. This procedure takes approximately one hour. There are no restrictions for this procedure. Please do NOT wear cologne, perfume, aftershave, or lotions (deodorant is allowed). Please arrive 15 minutes prior to your appointment time.        Dear [name]  You are scheduled for a Cardioversion on Thursday, June 13 with Dr. Armanda Magic.  Please arrive at the [facility] (Main Entrance A) at [facility]: [address] [place], [place] [address] at 7:30 AM (This time is 1 hour(s) before your procedure to ensure your preparation). Free valet parking service is available. You will check in at ADMITTING. The support person will be asked to wait in the waiting room.  It is OK to have someone drop you off and come back when you are ready to be discharged.    Nothing to eat or drink after midnight except a sip of water with medications (see medication instructions below)    LABS:   Come to [facility] [address], [address]  FYI:  For your safety, and to allow [place] to monitor your vital signs accurately during the  surgery/procedure we request: If you have artificial nails, gel coating, SNS etc, please have those removed prior to your surgery/procedure. Not having the nail coverings /polish removed may result in cancellation or delay of your surgery/procedure.  You must have a responsible person to drive you home and stay in the waiting area during your procedure. Failure to do so could result in cancellation.  Bring your insurance cards.  *Special Note: Every effort is made to have your procedure done on time. Occasionally there are emergencies that occur at the hospital that may cause delays. Please be patient if a delay does occur.      Follow-Up: At [facility], you and your health needs are our priority.  As part of our continuing mission to provide you with exceptional heart care, we have created designated Provider Care Teams.  These Care Teams include your primary Cardiologist (physician) and Advanced Practice Providers (APPs -  Physician Assistants and Nurse Practitioners) who all work together to provide you with the care you need, when you need it.  We recommend signing up for the patient portal called "MyChart".  Sign up information is provided on this After Visit Summary.  MyChart is used to connect with patients for Virtual Visits (Telemedicine).  Patients are able to view lab/test results, encounter notes, upcoming appointments, etc.  Non-urgent messages can be sent to your provider as well.   To learn more about what you can do with  MyChart, go to [email or web address].    Your next appointment:   3-4 weeks Post Cardioversion  Provider:   Marjie Skiff, PA-C

## 2023-05-15 ENCOUNTER — Ambulatory Visit (HOSPITAL_BASED_OUTPATIENT_CLINIC_OR_DEPARTMENT_OTHER): Payer: Medicare HMO | Admitting: Family

## 2023-05-22 ENCOUNTER — Ambulatory Visit: Payer: Medicare HMO | Admitting: Student

## 2023-05-23 ENCOUNTER — Other Ambulatory Visit: Payer: Self-pay | Admitting: Cardiology

## 2023-05-23 DIAGNOSIS — R0602 Shortness of breath: Secondary | ICD-10-CM

## 2023-05-24 ENCOUNTER — Other Ambulatory Visit: Payer: Self-pay | Admitting: Family Medicine

## 2023-05-24 DIAGNOSIS — K2100 Gastro-esophageal reflux disease with esophagitis, without bleeding: Secondary | ICD-10-CM

## 2023-05-25 ENCOUNTER — Telehealth: Payer: Self-pay | Admitting: Cardiology

## 2023-05-25 NOTE — Telephone Encounter (Signed)
Kendall [facility] called in stating this pt needs his cardioversion r/s due to not having a CRNA  

## 2023-05-25 NOTE — Telephone Encounter (Signed)
Due to no anesthesia the patients time for her DCCV has changed. It is still scheduled for 06/04/23 but at 12:30 pm. She will need to arrive at the hospital at 11 to 11:30 am. Left message for pt to call to discuss.

## 2023-05-26 NOTE — Telephone Encounter (Signed)
Left message for pt to call,  DCCV rescheduled to 06/10/23 @ 11:30 am. She wanted dr turner to do the cardioversion but she is not available so was scheduled with dr Bjorn Pippin.

## 2023-05-26 NOTE — Telephone Encounter (Signed)
Spoke with pt, aware of date and time of DCCV.

## 2023-05-26 NOTE — Telephone Encounter (Signed)
Spoke with pt, aware of time change. She will not have transportation that late in the day. She would like to reschedule to after 6/18. Aware will call the hospital and let her know.

## 2023-05-26 NOTE — Telephone Encounter (Signed)
Patient returned RN's call. 

## 2023-06-03 ENCOUNTER — Telehealth: Payer: Self-pay | Admitting: Cardiology

## 2023-06-03 NOTE — Telephone Encounter (Signed)
Advised that yes she still needs the echo. She thought this was to tell if the cardioversion worked or not.  Advised information regarding the echo.  She states understanding

## 2023-06-03 NOTE — Telephone Encounter (Signed)
  Pt is calling back, would like to ask if she need to r/s her echo after DCCV

## 2023-06-04 DIAGNOSIS — I4819 Other persistent atrial fibrillation: Secondary | ICD-10-CM

## 2023-06-05 DIAGNOSIS — E785 Hyperlipidemia, unspecified: Secondary | ICD-10-CM | POA: Diagnosis not present

## 2023-06-05 DIAGNOSIS — I340 Nonrheumatic mitral (valve) insufficiency: Secondary | ICD-10-CM | POA: Diagnosis not present

## 2023-06-05 DIAGNOSIS — N1831 Chronic kidney disease, stage 3a: Secondary | ICD-10-CM | POA: Diagnosis not present

## 2023-06-05 DIAGNOSIS — I480 Paroxysmal atrial fibrillation: Secondary | ICD-10-CM | POA: Diagnosis not present

## 2023-06-05 DIAGNOSIS — I5032 Chronic diastolic (congestive) heart failure: Secondary | ICD-10-CM | POA: Diagnosis not present

## 2023-06-05 DIAGNOSIS — I10 Essential (primary) hypertension: Secondary | ICD-10-CM | POA: Diagnosis not present

## 2023-06-06 LAB — BASIC METABOLIC PANEL
BUN/Creatinine Ratio: 14 (ref 12–28)
BUN: 13 mg/dL (ref 8–27)
CO2: 22 mmol/L (ref 20–29)
Calcium: 9.5 mg/dL (ref 8.7–10.3)
Chloride: 106 mmol/L (ref 96–106)
Creatinine, Ser: 0.95 mg/dL (ref 0.57–1.00)
Glucose: 85 mg/dL (ref 70–99)
Potassium: 4.7 mmol/L (ref 3.5–5.2)
Sodium: 143 mmol/L (ref 134–144)
eGFR: 60 mL/min/{1.73_m2} (ref >59)

## 2023-06-06 LAB — CBC
Hematocrit: 41.4 % (ref 34.0–46.6)
Hemoglobin: 13.8 g/dL (ref 11.1–15.9)
MCH: 28.8 pg (ref 26.6–33.0)
MCHC: 33.3 g/dL (ref 31.5–35.7)
MCV: 86 fL (ref 79–97)
Platelets: 249 10*3/uL (ref 150–450)
RBC: 4.8 x10E6/uL (ref 3.77–5.28)
RDW: 14.5 % (ref 11.7–15.4)
WBC: 8.7 10*3/uL (ref 3.4–10.8)

## 2023-06-06 LAB — BRAIN NATRIURETIC PEPTIDE: BNP: 331.7 pg/mL — ABNORMAL HIGH (ref 0.0–100.0)

## 2023-06-07 IMAGING — DX DG CHEST 2V
2 series · 2 of 2 positions shown · non-contrast
Comparison: Chest x-ray 04/06/2020.

CLINICAL DATA: Dyspnea on exertion.

EXAM:
CHEST - 2 VIEW

[chest pa]
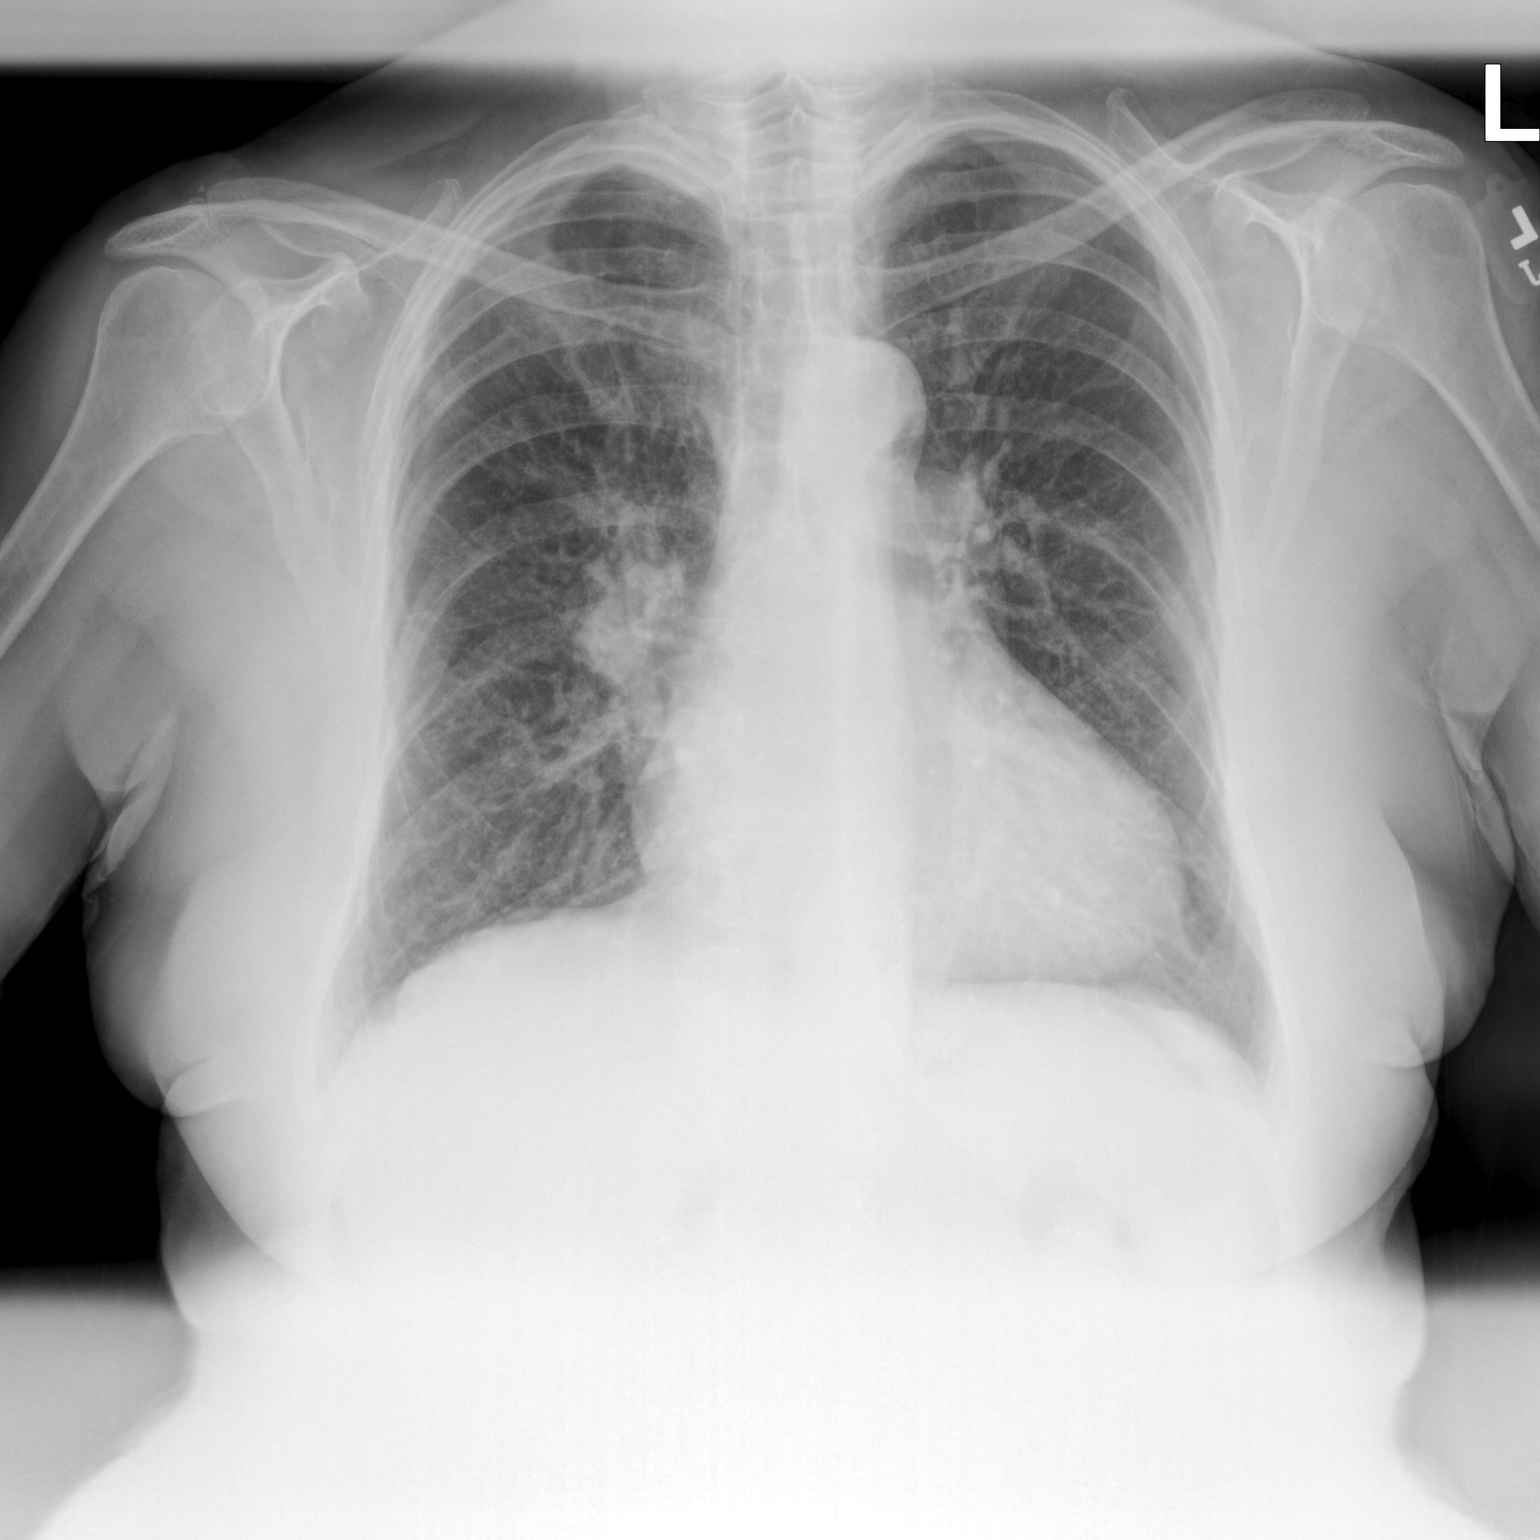

[chest lat]
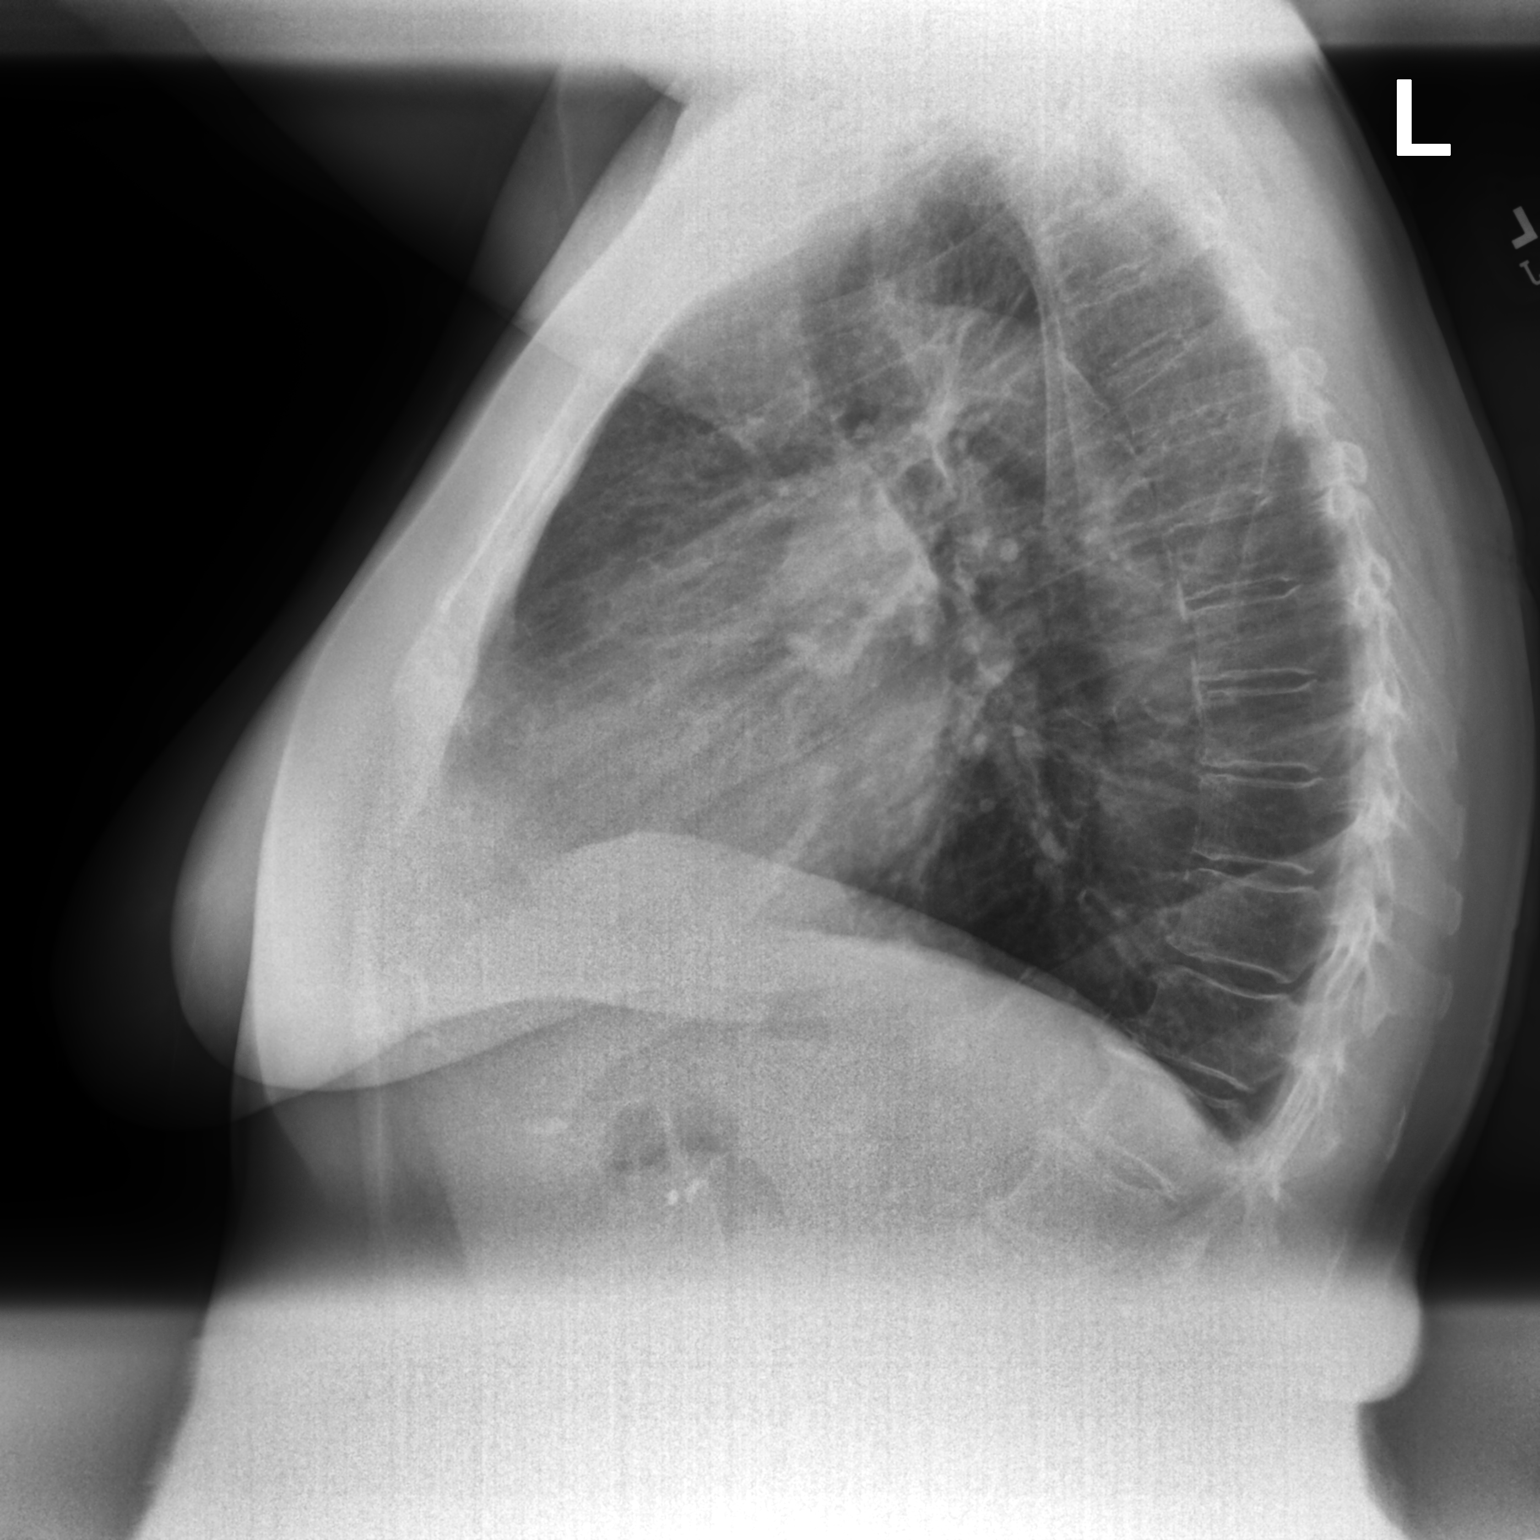

[2 of 2 positions shown; findings below may reference images not displayed]

FINDINGS: Cardiac silhouette is enlarged, unchanged. There is central
pulmonary vascular congestion. There is no lung infiltrate, pleural
effusion or pneumothorax. No acute fractures are seen.
IMPRESSION: 1. Cardiomegaly with central pulmonary vascular congestion.

## 2023-06-09 ENCOUNTER — Ambulatory Visit (HOSPITAL_COMMUNITY): Payer: Medicare HMO | Attending: Cardiology

## 2023-06-09 DIAGNOSIS — I5032 Chronic diastolic (congestive) heart failure: Secondary | ICD-10-CM | POA: Insufficient documentation

## 2023-06-09 DIAGNOSIS — I10 Essential (primary) hypertension: Secondary | ICD-10-CM | POA: Insufficient documentation

## 2023-06-09 DIAGNOSIS — I4819 Other persistent atrial fibrillation: Secondary | ICD-10-CM | POA: Insufficient documentation

## 2023-06-09 DIAGNOSIS — E785 Hyperlipidemia, unspecified: Secondary | ICD-10-CM | POA: Insufficient documentation

## 2023-06-09 DIAGNOSIS — N1831 Chronic kidney disease, stage 3a: Secondary | ICD-10-CM | POA: Insufficient documentation

## 2023-06-09 DIAGNOSIS — I340 Nonrheumatic mitral (valve) insufficiency: Secondary | ICD-10-CM | POA: Diagnosis not present

## 2023-06-09 LAB — ECHOCARDIOGRAM COMPLETE
MV M vel: 4.7 m/s
MV Peak grad: 88.4 mmHg
Radius: 0.7 cm
S' Lateral: 2.9 cm

## 2023-06-09 NOTE — Pre-Procedure Instructions (Signed)
Spoke to patient on phone regarding cardioversion tomorrow:   Instructed patient to arrive at 1030, NPO after midnight  Confirmed patient hasn't missed any doses of eliquis. Instructed to take it tonight and tomorrow with a sip of water in the AM  Confirmed patient has ride home and responsible person to stay with her for 24 hours after the procedure

## 2023-06-10 ENCOUNTER — Other Ambulatory Visit: Payer: Self-pay

## 2023-06-10 ENCOUNTER — Encounter (HOSPITAL_COMMUNITY)
Admission: RE | Disposition: A | Discharge status: Home / Self Care | Payer: Self-pay | Source: Home / Self Care | Attending: Cardiology

## 2023-06-10 ENCOUNTER — Ambulatory Visit (HOSPITAL_BASED_OUTPATIENT_CLINIC_OR_DEPARTMENT_OTHER): Payer: Medicare HMO | Admitting: Certified Registered Nurse Anesthetist

## 2023-06-10 ENCOUNTER — Ambulatory Visit (HOSPITAL_COMMUNITY)
Admission: RE | Admit: 2023-06-10 | Discharge: 2023-06-10 | Disposition: A | Discharge status: Home / Self Care | Payer: Medicare HMO | Attending: Cardiology | Admitting: Cardiology

## 2023-06-10 ENCOUNTER — Ambulatory Visit (HOSPITAL_COMMUNITY): Payer: Medicare HMO | Admitting: Certified Registered Nurse Anesthetist

## 2023-06-10 DIAGNOSIS — I10 Essential (primary) hypertension: Secondary | ICD-10-CM | POA: Diagnosis not present

## 2023-06-10 DIAGNOSIS — I130 Hypertensive heart and chronic kidney disease with heart failure and stage 1 through stage 4 chronic kidney disease, or unspecified chronic kidney disease: Secondary | ICD-10-CM | POA: Insufficient documentation

## 2023-06-10 DIAGNOSIS — I4819 Other persistent atrial fibrillation: Secondary | ICD-10-CM

## 2023-06-10 DIAGNOSIS — I5032 Chronic diastolic (congestive) heart failure: Secondary | ICD-10-CM | POA: Diagnosis not present

## 2023-06-10 DIAGNOSIS — Z79899 Other long term (current) drug therapy: Secondary | ICD-10-CM | POA: Diagnosis not present

## 2023-06-10 DIAGNOSIS — R0789 Other chest pain: Secondary | ICD-10-CM | POA: Insufficient documentation

## 2023-06-10 DIAGNOSIS — R0609 Other forms of dyspnea: Secondary | ICD-10-CM | POA: Insufficient documentation

## 2023-06-10 DIAGNOSIS — K219 Gastro-esophageal reflux disease without esophagitis: Secondary | ICD-10-CM | POA: Insufficient documentation

## 2023-06-10 DIAGNOSIS — I340 Nonrheumatic mitral (valve) insufficiency: Secondary | ICD-10-CM | POA: Diagnosis not present

## 2023-06-10 DIAGNOSIS — N1831 Chronic kidney disease, stage 3a: Secondary | ICD-10-CM | POA: Diagnosis not present

## 2023-06-10 DIAGNOSIS — Z7901 Long term (current) use of anticoagulants: Secondary | ICD-10-CM | POA: Insufficient documentation

## 2023-06-10 DIAGNOSIS — J449 Chronic obstructive pulmonary disease, unspecified: Secondary | ICD-10-CM | POA: Diagnosis not present

## 2023-06-10 DIAGNOSIS — J4489 Other specified chronic obstructive pulmonary disease: Secondary | ICD-10-CM | POA: Insufficient documentation

## 2023-06-10 DIAGNOSIS — E785 Hyperlipidemia, unspecified: Secondary | ICD-10-CM | POA: Diagnosis not present

## 2023-06-10 HISTORY — PX: CARDIOVERSION: SHX1299

## 2023-06-10 SURGERY — CARDIOVERSION
Anesthesia: General

## 2023-06-10 MED ORDER — PROPOFOL 10 MG/ML IV BOLUS
INTRAVENOUS | Status: DC | PRN
  Administered 2023-06-10: 60 mg via INTRAVENOUS

## 2023-06-10 MED ORDER — SODIUM CHLORIDE 0.9 % IV SOLN: INTRAVENOUS | Status: DC

## 2023-06-10 MED ORDER — LIDOCAINE 2% (20 MG/ML) 5 ML SYRINGE
INTRAMUSCULAR | Status: DC | PRN
  Administered 2023-06-10: 60 mg via INTRAVENOUS

## 2023-06-10 SURGICAL SUPPLY — 1 items: ELECT DEFIB PAD ADLT CADENCE (PAD) ×1 IMPLANT

## 2023-06-10 NOTE — CV Procedure (Signed)
Procedure:   DCCV  Indication:  Symptomatic atrial fibrillation  Procedure Note:  The patient signed informed consent.  They have had had therapeutic anticoagulation with Eliquis greater than 3 weeks.  Anesthesia was administered by Dr. Maple Hudson.  Adequate airway was maintained throughout and vital followed per protocol.  They were cardioverted x 1 with 200J of biphasic synchronized energy.  They converted to NSR with rate 50s.  There were no apparent complications.  The patient had normal neuro status and respiratory status post procedure with vitals stable as recorded elsewhere.    Follow up:  They will continue on current medical therapy and follow up with cardiology as scheduled.  Epifanio Lesches, MD 06/10/2023 12:23 PM

## 2023-06-10 NOTE — Interval H&P Note (Signed)
History and Physical Interval Note:  06/10/2023 11:46 AM  Rebekah Harrell  has presented today for surgery, with the diagnosis of PERSISTENT AFIB.  The various methods of treatment have been discussed with the patient and family. After consideration of risks, benefits and other options for treatment, the patient has consented to  Procedure(s): CARDIOVERSION (N/A) as a surgical intervention.  The patient's history has been reviewed, patient examined, no change in status, stable for surgery.  I have reviewed the patient's chart and labs.  Questions were answered to the patient's satisfaction.     Little Ishikawa

## 2023-06-10 NOTE — Discharge Instructions (Signed)

## 2023-06-10 NOTE — Transfer of Care (Signed)
Immediate Anesthesia Transfer of Care Note  Patient: Rebekah Harrell  Procedure(s) Performed: CARDIOVERSION  Patient Location: Cath Lab  Anesthesia Type:MAC  Level of Consciousness: awake, alert , and oriented  Airway & Oxygen Therapy: Patient Spontanous Breathing and Patient connected to nasal cannula oxygen  Post-op Assessment: Report given to RN and Post -op Vital signs reviewed and stable  Post vital signs: Reviewed and stable  Last Vitals:  Vitals Value Taken Time  BP 108/57 06/10/23 1200  Temp 36.7 C 06/10/23 1157  Pulse 54 06/10/23 1200  Resp 20 06/10/23 1200  SpO2 93 % 06/10/23 1200  Vitals shown include unvalidated device data.  Last Pain:  Vitals:   06/10/23 1157  TempSrc: Temporal  PainSc: 0-No pain         Complications: No notable events documented.

## 2023-06-11 ENCOUNTER — Encounter (HOSPITAL_COMMUNITY): Payer: Self-pay | Admitting: Cardiology

## 2023-06-13 ENCOUNTER — Encounter: Payer: Self-pay | Admitting: Cardiology

## 2023-06-14 ENCOUNTER — Other Ambulatory Visit: Payer: Self-pay | Admitting: Cardiology

## 2023-06-15 ENCOUNTER — Encounter: Payer: Self-pay | Admitting: Student

## 2023-06-15 NOTE — Anesthesia Preprocedure Evaluation (Addendum)
Anesthesia Evaluation  Patient identified by MRN, date of birth, ID band Patient awake    Reviewed: Allergy & Precautions, NPO status , Patient's Chart, lab work & pertinent test results  History of Anesthesia Complications Negative for: history of anesthetic complications  Airway Mallampati: III  TM Distance: >3 FB Neck ROM: Full    Dental  (+) Dental Advisory Given   Pulmonary asthma , COPD   breath sounds clear to auscultation       Cardiovascular hypertension, + dysrhythmias Atrial Fibrillation + Valvular Problems/Murmurs  Rhythm:Irregular     Neuro/Psych negative neurological ROS  negative psych ROS   GI/Hepatic hiatal hernia,GERD  ,,  Endo/Other    Renal/GU Renal disease     Musculoskeletal  (+) Arthritis ,    Abdominal   Peds  Hematology   Anesthesia Other Findings   Reproductive/Obstetrics                             Anesthesia Physical Anesthesia Plan  ASA: 3  Anesthesia Plan: General   Post-op Pain Management: Minimal or no pain anticipated   Induction: Intravenous  PONV Risk Score and Plan: 3 and Treatment may vary due to age or medical condition  Airway Management Planned: Nasal Cannula, Natural Airway and Mask  Additional Equipment: None  Intra-op Plan:   Post-operative Plan:   Informed Consent: I have reviewed the patients History and Physical, chart, labs and discussed the procedure including the risks, benefits and alternatives for the proposed anesthesia with the patient or authorized representative who has indicated his/her understanding and acceptance.     Dental advisory given  Plan Discussed with: CRNA  Anesthesia Plan Comments:        Anesthesia Quick Evaluation

## 2023-06-15 NOTE — Anesthesia Postprocedure Evaluation (Signed)
Anesthesia Post Note  Patient: Rebekah Harrell  Procedure(s) Performed: CARDIOVERSION     Patient location during evaluation: Cath Lab Anesthesia Type: General Level of consciousness: patient cooperative and awake Pain management: pain level controlled Vital Signs Assessment: post-procedure vital signs reviewed and stable Respiratory status: spontaneous breathing, nonlabored ventilation and respiratory function stable Cardiovascular status: stable Postop Assessment: no apparent nausea or vomiting Anesthetic complications: no   No notable events documented.  Last Vitals:  Vitals:   06/10/23 1100 06/10/23 1157  BP: (!) 107/90   Pulse: (!) 114   Resp: 18   Temp:  36.7 C  SpO2: 95%     Last Pain:  Vitals:   06/10/23 1157  TempSrc: Temporal  PainSc: 0-No pain                 Nina Hoar

## 2023-06-15 NOTE — Progress Notes (Signed)
Cardiology Office Note:    Date:  06/23/2023   ID:  MELVENE ESKOLA, DOB 1941/04/30, MRN [id number]  PCP:  Agapito Games, MD  Cardiologist:  Olga Millers, MD  Electrophysiologist:  None   Referring MD: Agapito Games, *   Chief Complaint: follow-up of persistent atrial fibrillation  History of Present Illness:    Rebekah Harrell is a 82 y.o. female with a history of persistent atrial fibrillation on Eliquis, chronic diastolic CHF, mild to moderate MR, hypertension, hyperlipidemia, CKD stage III, COPD, and GERD who is followed by Dr. Jens Som and presents today for atrial fibrillation.    Patient was initial diagnosed with atrial fibrillation in 05/2016. She was admitted in 11/2022 with atrial fibrillation with RVR and acute diastolic CHF. Echo showed LVEF of 50-55% with normal wall motion and moderate LVH, mildly reduced RV function, and mild MR. She was started on Sotalol and converted to sinus rhythm. Last Echo in 03/2020 showed LVEF of 55-60% with normal wall motion and grade 2 diastolic dysfunction, normal RV function, moderate left atrial enlargement, and mild to moderate MR. She was last seen by Dr. Jens Som in 12/2021 at which time she reported dyspnea on exertion but was otherwise doing well from a cardiac standpoint. Of note, she discontinued Eliquis on her own due to risk of bleeding and expense.   Patient was last seen by me in 04/2023 at which time she was back in atrial fibrillation. She felt like she had like been back in atrial fibrillation for the past month. She reported chronic dyspnea on exertion and stated this had gotten progressively worse over the last year. She also described some atypical chest heaviness that was reproducible with palpation of the chest wall. BNP came back mildly elevated at 331. Lasix was increased from PRN to daily use. Repeat Echo was ordered and showed LVEF of 50-55% with normal wall motion and mild LVH, mildly enlarged RV with mildly  reduced systolic function and moderately elevated PASP of 46.9 mmHg, moderate biatrial enlargement, and severe MAC with moderate MR. She was restarted on Eliquis with plans and outpatient DCCV was arranged after 3 weeks of uninterrupted anticoagulation. She underwent a successful DCCV on 06/10/2023 with restoration of sinus rhythm.   She sent our office a MyChart message on 06/13/2023 with concerns that she went back into atrial fibrillation after using her Symbicort inhaler. She also thinks Symbicort is the reason she went back into atrial fibrillation several months ago after not having any issue with this in years. Therefore, this visit was arranged for further evaluation. EKG today does show she is back in atrial fibrillation but she is rate controlled. She states she was feeling great for the 1.5 days while back in normal sinus rhythm and the chest heaviness had completely gone away. She then developed significant shortness of breath after not taking Symbicort for several days. She felt like this was due to her COPD and not being on any inhaler so she used her Symbicort inhaler and felt like this then sent her back into atrial fibrillation. She states she now feels exactly like she did at her last visit. The chest heaviness has returned. She has chronic dyspnea on exertion due to her COPD. She thinks this may of improved some while briefly back in normal sinus rhythm. There was a misunderstanding and she never increased her Lasix to daily use. She is only taking it as needed for edema. She notes occasional brief palpitations. She also reports  some lightheadedness/ dizziness when going from laying to standing but this is not new and is stable. No syncope.  She saw her Pulmonologist who stopped the Symbicort and switched her to Spiriva and Fluticasone. However, she has not started these yet because she wanted to check with [place] first.   EKGs/Labs/Other Studies Reviewed:    The following studies were  reviewed:  Echocardiogram 06/09/2023: Impressions: 1. Left ventricular ejection fraction, by estimation, is 50 to 55%. The  left ventricle has low normal function. The left ventricle has no regional  wall motion abnormalities. There is mild left ventricular hypertrophy.  Left ventricular diastolic  parameters are indeterminate.   2. Right ventricular systolic function is mildly reduced. The right  ventricular size is mildly enlarged. There is moderately elevated  pulmonary artery systolic pressure. The estimated right ventricular  systolic pressure is 46.9 mmHg.   3. Left atrial size was moderately dilated.   4. Right atrial size was moderately dilated.   5. The mitral valve is degenerative. Moderate mitral valve regurgitation.  Severe mitral annular calcification.   6. The aortic valve is tricuspid. Aortic valve regurgitation is not  visualized. Aortic valve sclerosis/calcification is present, without any  evidence of aortic stenosis.   7. The inferior vena cava is dilated in size with >50% respiratory  variab    EKG:  EKG  ordered today.  EKG Interpretation Date/Time:  Tuesday June 23 2023 11:11:02 EDT Ventricular Rate:  84 PR Interval:    QRS Duration:  82 QT Interval:  396 QTC Calculation: 467 R Axis:   83  Text Interpretation: Atrial fibrillation No acute ST/T wave changes. When compared with ECG of 10-Jun-2023 12:07, Atrial fibrillation has replaced Sinus rhythm Vent. rate has increased BY  29 BPM Confirmed by Marjie Skiff [phone]) on 06/23/2023 11:16:21 AM    Recent Labs: 04/30/2023: ALT 10; TSH 2.40 06/05/2023: BNP 331.7; BUN 13; Creatinine, Ser 0.95; Hemoglobin 13.8; Platelets 249; Potassium 4.7; Sodium 143  Recent Lipid Panel    Component Value Date/Time   CHOL 154 08/27/2022 1218   CHOL 148 11/18/2017 1205   TRIG 215 (H) 08/27/2022 1218   HDL 47 (L) 08/27/2022 1218   HDL 38 (L) 11/18/2017 1205   CHOLHDL 3.3 08/27/2022 1218   VLDL 26 06/19/2016 1136   LDLCALC  76 08/27/2022 1218    Physical Exam:    Vital Signs: BP 112/70 (BP Location: Left Arm, Patient Position: Sitting, Cuff Size: Normal)   Pulse 84   Ht 5\' 1"  (1.549 m)   Wt 173 lb 9.6 oz (78.7 kg)   SpO2 97%   BMI 32.80 kg/m     Wt Readings from Last 3 Encounters:  06/23/23 173 lb 9.6 oz (78.7 kg)  06/18/23 174 lb (78.9 kg)  06/10/23 173 lb (78.5 kg)     General: 82 y.o. Caucasian female in no acute distress. HEENT: Normocephalic and atraumatic. Sclera clear.  Neck: Supple. No No JVD. Heart:  Irregularly irregular rhythm with normal rate. Distinct S1 and S2. No murmurs, gallops, or rubs.  Lungs: No increased work of breathing. Clear to ausculation bilaterally. No wheezes, rhonchi, or rales.  Abdomen: Soft, non-distended, and non-tender to palpation.  Extremities: No lower extremity edema.    Skin: Warm and dry. Neuro: Alert and oriented x3. No focal deficits. Psych: Normal affect. Responds appropriately.  Assessment:    1. Paroxysmal atrial fibrillation (HCC)   2. Dyspnea on exertion   3. Chronic diastolic CHF (congestive heart failure) (HCC)  4. Chest heaviness   5. Moderate mitral regurgitation   6. Primary hypertension   7. Hyperlipidemia, unspecified hyperlipidemia type   8. Stage 3 chronic kidney disease, unspecified whether stage 3a or 3b CKD (HCC)     Plan:    Persistent Atrial Fibrillation Initially diagnosed in 2017 and has been on Sotalol. She has done very well on the Sotalol until recently. She was noted to be back in atrial fibrillation at PCP's office in early May. She underwent successful DCCV on 06/10/2023 with restoration of sinus rhythm.  - EKG today shows she is back in atrial fibrillation but rate controlled. - Continue Sotalol 80mg  twice daily.  - Hesitant to add beta-blocker or long acting Diltiazem because she is usually bradycardic when in sinus rhythm.  - CHA2DS2-VASc = 5 (CHF, HTN,  age x2, gender) indicating a 7.2% annual risk of stroke. She  has not been on anticoagulation. Continue Eliquis 5mg  twice daily.  - Recurrence seems to be related to use of Symbicort inhaler. Symbicort has not been stopped and Pulmonologist prescribed different inhalers (Spiriva and Fluticasone). Ok to start these. She is symptomatic when she is in atrial fibrillation with associated chest heaviness. Now that she is no longer on Symbicort. I think it is worth trying to repeat a DCCV. However, I would like to get her back into the [facility] to see if they have any other recommendations before this. She has been on Sotalol for years but wonder if they would recommend another antiarrhythmic. We were able to get her an appointment with the [facility] later this week.   Dyspnea on Exertion Chronic Diastolic CHF Recent Echo in 05/2023 showed LVEF of 50-55% with normal wall motion and mild LVH, mildly enlarged RV with mildly reduced systolic function and moderately elevated PASP of 46.9 mmHg, moderate biatrial enlargement, and severe MAC with moderate MR.  - Patient has chronic dyspnea on exertion due to underlying COPD. - BNP was elevated last month in the 300s. I had recommended increasing Lasix from PRN to daily dosing; however, there was a misunderstanding and patient has only been taking PRN. I think dyspnea is primarily due to COPD; however, she may have a mild CHF component as well due to being back in atrial fibrillation. Therefore, advised patient to increase Lasix to 40mg  for 3 days and then decrease to 20mg  once daily. She can continue current potassium supplementation of 20 mEq once daily. - Will repeat BMET in 1 week.  Chest Heaviness These seems to be associated with her atrial fibrillation as it resolved with restoration of sinus rhythm and returned when she went back into atrial fibrillation. Patient has been referred to the Atrial Fibrillation later this week as above. Will hold off on ischemic evaluation for now and focus  on getting her back in normal rhythm. If she has recurrent chest discomfort when back in normal sinus rhythm, can consider ischemic evaluation at that time. However, advised patient to let us know if she has any new or worsening chest pain.   Moderate MR Noted on Echo in 05/2023. - Will continue to monitor with routine serial Echos.   Hypertension BP well controlled.  - Continue current medications: Amlodipine 10mg  daily and Hydralazine 50mg  twice times daily.    Hyperlipidemia Lipid panel in 08/2022: Total Cholesterol 154, Triglycerides 215, HDL 47, LDL 76.  - Continue Crestor 10mg  daily  - Labs followed by PCP.   CKD Stage III Baseline creatinine around 0.9 to 1.2.  Stable at 0.95 on recent labs in 05/2023. - Will repeat BMET in 1 week after increasing Lasix.  Disposition: Follow up in 2-3 months.   Medication Adjustments/Labs and Tests Ordered: Current medicines are reviewed at length with the patient today.  Concerns regarding medicines are outlined above.  Orders Placed This Encounter  Procedures   Basic Metabolic Panel (BMET)   EKG 12-Lead   Meds ordered this encounter  Medications   sotalol (BETAPACE) 80 MG tablet    Sig: Take 1 tablet (80 mg total) by mouth 2 (two) times daily.    Dispense:  270 tablet    Refill:  2   furosemide (LASIX) 20 MG tablet    Sig: Take 1 tablet (20 mg total) by mouth daily.    Dispense:  90 tablet    Refill:  3    Patient Instructions  INCREASE FUROSEMIDE TO 40 MG ONCE DAILY X 3 DAYS AND THEN REDUCE TO 20 MG ONCE DAILY= 2 OF THE 20 MG TABLETS ONCE DAILY FOR THE NEXT 3 DAYS AND THEN REDUCE TO 1 TABLET DAILY   Your physician recommends that you return for lab work in: ONE WEEK-DO NOT NEED TO FAST   Follow-Up: At [facility], you and your health needs are our priority.  As part of our continuing mission to provide you with exceptional heart care, we have created designated Provider Care Teams.  These Care Teams include your  primary Cardiologist (physician) and Advanced Practice Providers (APPs -  Physician Assistants and Nurse Practitioners) who all work together to provide you with the care you need, when you need it.  We recommend signing up for the patient portal called "MyChart".  Sign up information is provided on this After Visit Summary.  MyChart is used to connect with patients for Virtual Visits (Telemedicine).  Patients are able to view lab/test results, encounter notes, upcoming appointments, etc.  Non-urgent messages can be sent to your provider as well.   To learn more about what you can do with MyChart, go to [email or web address].    Your next appointment:   2 month(s)  Provider:   Marjie Skiff PA     Signed, Corrin Parker, PA-C  06/23/2023 2:14 PM    [facility]

## 2023-06-16 NOTE — Progress Notes (Unsigned)
Synopsis: Referred for COPD by Agapito Games, *  Subjective:   PATIENT ID: Rebekah Harrell GENDER: female DOB: 22-Mar-1941, MRN: [id number]  No chief complaint on file.  80yF with history of allergy, asthma, COPD, AF, CKD, GERD, hiatal hernia, +ANA with recurrent angioedema of unclear etiology on plaquenil followed by [facility], referred for COPD and worsening DOE. She never had covid-19 infection.  She says she's had breathing problems ever since she was little, attributes it at least in part to secondhand smoke exposure in past.   Now has DOE where she has stop speaking to catch her breath if she's in conversation however she is able to relate a good history today without interruption. DOE to 100 feet. She has no cough. No orthopnea. Weight has gone up recently. She was on course of prednisone for 10 days over January prescribed by rheumatologist but she noticed no effect on her breathing. Very rare sensation of dysphagia - doesn't frequently get choked on food/liquid. She snores once in a while. No PND. She is sleepy during the day but attributes it to her antihistamine regimen.   She didn't tolerate wixela well due to the dry powder formulation causing cough.   She sees allergist at [facility] in [place] sees Dr. Lacretia Nicks. On a bunch of antihistamines due to recurrent angioedema of unclear etiology. Has not been on AIT  No family history of lung disease  She worked staging houses in past, Oceanographer in past. Lots of secondhand smoke exposure but she never smoked herself, no vaping/MJ. Has a dog and cat at home.   Interval HPI: Started on symbicort 160 last visit which she has been using 2 puffs twice daily. Much less dyspneic with exertion. Still finds she needs to pause during conversations however to catch her breath. No cough. Hasn't needed rescue inhaler at all.   HRCT similar to prior ------------------------------ TTE 06/09/23 with increased RVSP  relative to prior (now 47), moderate MR  Underwent DCCV 6/19 for symptomatic AF  Otherwise pertinent review of systems is negative.  Past Medical History:  Diagnosis Date   Allergy    Aortic atherosclerosis (HCC)    Arthritis    "lower back; knees" (11/26/2016)   Asthma    Chronic atrial fibrillation (HCC)    CKD (chronic kidney disease), stage III (HCC)    patient unaware of this on 11/26/2016   Complication of anesthesia    "had trouble waking me up in the late 1990's after gallbladder OR"   COPD (chronic obstructive pulmonary disease) (HCC)    GERD (gastroesophageal reflux disease)    Heart murmur    "dx'd when I was a little kid"   History of hiatal hernia    Hyperlipidemia    Hypertension    Hypoglycemia      Family History  Problem Relation Age of Onset   CAD Mother        HEART ATTACK   Colon cancer Father        KIDNEY FAILURE/COLON CANCER   Atrial fibrillation Brother    Healthy Brother    Heart failure Maternal Grandmother    Kidney failure Maternal Grandfather    Emphysema Maternal Grandfather    Heart attack Paternal Grandmother      Past Surgical History:  Procedure Laterality Date   CARDIOVERSION N/A 06/10/2023   Procedure: CARDIOVERSION;  Surgeon: Little Ishikawa, MD;  Location: [facility];  Service: Cardiovascular;  Laterality: N/A;   CHOLECYSTECTOMY OPEN  1990s   TONSILLECTOMY     TUBAL LIGATION      Social History   Socioeconomic History   Marital status: Divorced    Spouse name: Not on file   Number of children: 3   Years of education: 14   Highest education level: Some college, no degree  Occupational History   Occupation: stages houses for sale    Comment: self employed   Occupation: Retired.  Tobacco Use   Smoking status: Never    Passive exposure: Past   Smokeless tobacco: Never  Vaping Use   Vaping Use: Never used  Substance and Sexual Activity   Alcohol use: Not Currently   Drug use: No   Sexual activity:  Not Currently  Other Topics Concern   Not on file  Social History Narrative   Lives alone. One son lives close by incase she needs anything. She enjoys crafting and decorating.    Social Determinants of Health   Financial Resource Strain: Medium Risk (04/28/2023)   Overall Financial Resource Strain (CARDIA)    Difficulty of Paying Living Expenses: Somewhat hard  Food Insecurity: No Food Insecurity (04/28/2023)   Hunger Vital Sign    Worried About Running Out of Food in the Last Year: Never true    Ran Out of Food in the Last Year: Never true  Transportation Needs: No Transportation Needs (04/28/2023)   PRAPARE - Administrator, Civil Service (Medical): No    Lack of Transportation (Non-Medical): No  Physical Activity: Inactive (04/28/2023)   Exercise Vital Sign    Days of Exercise per Week: 0 days    Minutes of Exercise per Session: 0 min  Stress: Stress Concern Present (04/28/2023)   [employer] of Occupational Health - Occupational Stress Questionnaire    Feeling of Stress : Rather much  Social Connections: Socially Isolated (04/28/2023)   Social Connection and Isolation Panel [NHANES]    Frequency of Communication with Friends and Family: Three times a week    Frequency of Social Gatherings with Friends and Family: Patient declined    Attends Religious Services: Never    Database administrator or Organizations: No    Attends Banker Meetings: Never    Marital Status: Divorced  Catering manager Violence: Not At Risk (01/19/2023)   Humiliation, Afraid, Rape, and Kick questionnaire    Fear of Current or Ex-Partner: No    Emotionally Abused: No    Physically Abused: No    Sexually Abused: No     Allergies  Allergen Reactions   Ace Inhibitors Swelling   Tramadol Other (See Comments)    Night sweats, bad dreams   Atorvastatin Other (See Comments)    Myalgias   Simvastatin     Myalgia    Celecoxib Other (See Comments)    REACTION: hair loss    Cetirizine Hcl Other (See Comments)    REACTION: fatigue   Penicillins Other (See Comments)    Unknown   Sulfonamide Derivatives Rash     Outpatient Medications Prior to Visit  Medication Sig Dispense Refill   amLODipine (NORVASC) 10 MG tablet TAKE 1 TABLET EVERY DAY (MUST KEEP SCHEDULED APPT) 30 tablet 0   apixaban (ELIQUIS) 5 MG TABS tablet Take 1 tablet (5 mg total) by mouth 2 (two) times daily. 60 tablet 2   CALCIUM PO Take 1 tablet by mouth at bedtime.     Cholecalciferol 125 MCG (5000 UT) capsule Take 5,000 Units by mouth daily.  diltiazem (CARDIZEM) 30 MG tablet Take 1 tablet every 4 hours AS NEEDED for heart rate >100 as long as top bp >100 45 tablet 1   famotidine (PEPCID) 20 MG tablet TAKE 1 TABLET AT BEDTIME 90 tablet 3   fexofenadine (ALLEGRA) 180 MG tablet TAKE 1 TABLET EVERY DAY 90 tablet 3   furosemide (LASIX) 20 MG tablet TAKE 1 TABLET EVERY DAY AS NEEDED 90 tablet 3   hydrALAZINE (APRESOLINE) 50 MG tablet TAKE 1 TABLET THREE TIMES DAILY (Patient taking differently: Take 50 mg by mouth 2 (two) times daily.) 270 tablet 3   hydroxychloroquine (PLAQUENIL) 200 MG tablet TAKE 1 TABLET EVERY DAY 90 tablet 3   Magnesium 200 MG TABS Take 1 tablet (200 mg total) by mouth daily. 60 each    montelukast (SINGULAIR) 10 MG tablet TAKE 1 TABLET AT BEDTIME 90 tablet 1   Multiple Vitamins-Minerals (MULTIVITAMIN PO) Take 1 tablet by mouth at bedtime.     mupirocin ointment (BACTROBAN) 2 % Apply topically 2 (two) times daily. (Patient not taking: Reported on 06/04/2023) 30 g 0   omeprazole (PRILOSEC) 20 MG capsule TAKE 1 CAPSULE EVERY DAY 90 capsule 3   potassium chloride SA (KLOR-CON M) 20 MEQ tablet TAKE 1 TABLET EVERY DAY 90 tablet 3   rosuvastatin (CRESTOR) 10 MG tablet Take 1 tablet (10 mg total) by mouth daily. 90 tablet 3   sotalol (BETAPACE) 80 MG tablet TAKE 1 TABLET THREE TIMES DAILY (Patient taking differently: Take 80 mg by mouth 2 (two) times daily.) 270 tablet 2   SYMBICORT  160-4.5 MCG/ACT inhaler Inhale 2 puffs into the lungs in the morning and at bedtime. 30.6 each 3   Zinc 50 MG TABS Take 50 mg by mouth daily.     No facility-administered medications prior to visit.       Objective:   Physical Exam:  General appearance: 82 y.o., female, NAD, conversant  Eyes: anicteric sclerae; PERRL, tracking appropriately HENT: NCAT; MMM Neck: Trachea midline; no lymphadenopathy, no JVD Lungs: CTAB, no crackles, no wheeze, with normal respiratory effort CV: RRR, no murmur  Abdomen: Soft, non-tender; non-distended, BS present  Extremities: No peripheral edema, warm Skin: Normal turgor and texture; no rash Psych: Appropriate affect Neuro: Alert and oriented to person and place, no focal deficit     There were no vitals filed for this visit.      on RA BMI Readings from Last 3 Encounters:  06/10/23 32.69 kg/m  05/12/23 32.54 kg/m  04/30/23 32.25 kg/m   Wt Readings from Last 3 Encounters:  06/10/23 173 lb (78.5 kg)  05/12/23 172 lb 3.2 oz (78.1 kg)  04/30/23 173 lb (78.5 kg)     CBC    Component Value Date/Time   WBC 8.7 06/05/2023 1548   WBC 8.4 04/30/2023 1433   RBC 4.80 06/05/2023 1548   RBC 4.88 04/30/2023 1433   HGB 13.8 06/05/2023 1548   HCT 41.4 06/05/2023 1548   PLT 249 06/05/2023 1548   MCV 86 06/05/2023 1548   MCH 28.8 06/05/2023 1548   MCH 27.9 04/30/2023 1433   MCHC 33.3 06/05/2023 1548   MCHC 31.8 (L) 04/30/2023 1433   RDW 14.5 06/05/2023 1548   LYMPHSABS 2,604 04/30/2023 1433   MONOABS 0.7 05/08/2022 1537   EOSABS 143 04/30/2023 1433   BASOSABS 67 04/30/2023 1433    Eos 240  Chest Imaging: CXR 04/06/20 reviewed by me with prominent R hilum  HRCT Chest 6/14 reviewed by me with subtle subpleural  reticular opacities, air trapping, RLL 1.21mm ggo, calcified LNs  HRCT Chest 01/22/23 reviewed by me similar to prior  Pulmonary Functions Testing Results:    Latest Ref Rng & Units 07/10/2022    9:53 AM  PFT Results   FVC-Pre L 1.82   FVC-Predicted Pre % 72   FVC-Post L 1.98   FVC-Predicted Post % 79   Pre FEV1/FVC % % 68   Post FEV1/FCV % % 70   FEV1-Pre L 1.23   FEV1-Predicted Pre % 66   FEV1-Post L 1.40   DLCO uncorrected ml/min/mmHg 13.01   DLCO UNC% % 71   DLCO corrected ml/min/mmHg 13.01   DLCO COR %Predicted % 71   DLVA Predicted % 74   TLC L 5.56   TLC % Predicted % 113   RV % Predicted % 149    Mild obstruction, borderline bronchodilator response, air trapping, mildly reduced diffusing capacity   Echocardiogram:   TTE 04/11/20: 1. Left ventricular ejection fraction, by estimation, is 55 to 60%. The  left ventricle has normal function. The left ventricle has no regional  wall motion abnormalities. Left ventricular diastolic parameters are  consistent with Grade II diastolic  dysfunction (pseudonormalization).   2. Right ventricular systolic function is normal. The right ventricular  size is normal. There is mildly elevated pulmonary artery systolic  pressure.   3. Left atrial size was moderately dilated.   4. The mitral valve is normal in structure. Mild to moderate mitral valve  regurgitation. No evidence of mitral stenosis.   5. The aortic valve is normal in structure. Aortic valve regurgitation is  not visualized. No aortic stenosis is present.   6. The inferior vena cava is normal in size with greater than 50%  respiratory variability, suggesting right atrial pressure of 3 mmHg.   7. Compared to echocardiogram from 2017 MR from mild now is mild to  moderate, LA size increased to moderate enlargement from mild. Disatolic  dysfunction.   TTE 06/09/23  1. Left ventricular ejection fraction, by estimation, is 50 to 55%. The  left ventricle has low normal function. The left ventricle has no regional  wall motion abnormalities. There is mild left ventricular hypertrophy.  Left ventricular diastolic  parameters are indeterminate.   2. Right ventricular systolic function is  mildly reduced. The right  ventricular size is mildly enlarged. There is moderately elevated  pulmonary artery systolic pressure. The estimated right ventricular  systolic pressure is 46.9 mmHg.   3. Left atrial size was moderately dilated.   4. Right atrial size was moderately dilated.   5. The mitral valve is degenerative. Moderate mitral valve regurgitation.  Severe mitral annular calcification.   6. The aortic valve is tricuspid. Aortic valve regurgitation is not  visualized. Aortic valve sclerosis/calcification is present, without any  evidence of aortic stenosis.   7. The inferior vena cava is dilated in size with >50% respiratory  variability, suggesting right atrial pressure of 8 mmHg.       Assessment & Plan:   # DOE # Asthma-copd overlap Does have undertreated ACOS. Alternatively could have early ILD (vs sequelae of prior indolent infx process like endemic fungal infx, etc given calcified mediastinal LAD or less likely sarcoid - neither of these look active to me on imaging) though extent very minor, diastolic dysfunction (but euvolemic on exam again today), angina, deconditioning.   # Recurrent angioedema, unclear etiology # Elevated ANA  # RLL 1.1cm ggo   Plan: - continue symbicort 160 2  puffs twice daily, rinse mouth after use. Has cough with DPI inhalers.  - albuterol prn - daily nonsedating antihistamine, ok to continue singulair - CT Chest in 1 year for RLL ggo, need to order at next visit. Discussed options including CT surveillance, biopsy (50% yield with nav), referral for wedge resection. She opts for CT surveillance.       Omar Person, MD [facility] 06/16/2023 12:19 PM

## 2023-06-18 ENCOUNTER — Ambulatory Visit: Payer: Medicare HMO | Admitting: Student

## 2023-06-18 ENCOUNTER — Encounter: Payer: Self-pay | Admitting: Student

## 2023-06-18 VITALS — BP 118/70 | HR 69 | Temp 98.0°F | Ht 61.0 in | Wt 174.0 lb

## 2023-06-18 DIAGNOSIS — R0609 Other forms of dyspnea: Secondary | ICD-10-CM | POA: Diagnosis not present

## 2023-06-18 DIAGNOSIS — R911 Solitary pulmonary nodule: Secondary | ICD-10-CM | POA: Diagnosis not present

## 2023-06-18 DIAGNOSIS — J4489 Other specified chronic obstructive pulmonary disease: Secondary | ICD-10-CM

## 2023-06-18 MED ORDER — SPIRIVA RESPIMAT 1.25 MCG/ACT IN AERS: 2.0000 | INHALATION_SPRAY | Freq: Every day | RESPIRATORY_TRACT | 0 refills | Status: DC

## 2023-06-18 MED ORDER — ARNUITY ELLIPTA 100 MCG/ACT IN AEPB: 1.0000 | INHALATION_SPRAY | Freq: Every day | RESPIRATORY_TRACT | 11 refills | Status: DC

## 2023-06-18 NOTE — Patient Instructions (Addendum)
-   Stop symbicort - start arnuity 1 puff once daily rinse mouth and brush tongue/teeth after each use - can add spiriva 1-2 puffs daily - if you think this is helpful let us know and we can see what it would cost to prescribe  - albuterol is your rescue - use as needed - ct chest in 01/2024 - get Dr. Judeth Horn to order it  - Appt with Dr. Judeth Horn in 3 months

## 2023-06-18 NOTE — Telephone Encounter (Signed)
Patient was seen by Dr. Thora Lance today. Will close encounter.

## 2023-06-23 ENCOUNTER — Ambulatory Visit: Payer: Medicare HMO | Admitting: Adult Health

## 2023-06-23 ENCOUNTER — Encounter: Payer: Self-pay | Admitting: Student

## 2023-06-23 ENCOUNTER — Ambulatory Visit: Payer: Medicare HMO | Attending: Adult Health | Admitting: Student

## 2023-06-23 VITALS — BP 112/70 | HR 84 | Ht 61.0 in | Wt 173.6 lb

## 2023-06-23 DIAGNOSIS — I480 Paroxysmal atrial fibrillation: Secondary | ICD-10-CM | POA: Diagnosis not present

## 2023-06-23 DIAGNOSIS — I340 Nonrheumatic mitral (valve) insufficiency: Secondary | ICD-10-CM

## 2023-06-23 DIAGNOSIS — R0789 Other chest pain: Secondary | ICD-10-CM | POA: Diagnosis not present

## 2023-06-23 DIAGNOSIS — N183 Chronic kidney disease, stage 3 unspecified: Secondary | ICD-10-CM

## 2023-06-23 DIAGNOSIS — I5032 Chronic diastolic (congestive) heart failure: Secondary | ICD-10-CM

## 2023-06-23 DIAGNOSIS — E785 Hyperlipidemia, unspecified: Secondary | ICD-10-CM | POA: Diagnosis not present

## 2023-06-23 DIAGNOSIS — I10 Essential (primary) hypertension: Secondary | ICD-10-CM | POA: Diagnosis not present

## 2023-06-23 DIAGNOSIS — R0609 Other forms of dyspnea: Secondary | ICD-10-CM

## 2023-06-23 MED ORDER — FUROSEMIDE 20 MG PO TABS
20.0000 mg | ORAL_TABLET | Freq: Every day | ORAL | 3 refills | Status: DC
Start: 2023-06-23 — End: 2024-01-22

## 2023-06-23 MED ORDER — SOTALOL HCL 80 MG PO TABS: 80.0000 mg | ORAL_TABLET | Freq: Two times a day (BID) | ORAL | 2 refills | Status: DC

## 2023-06-23 NOTE — Patient Instructions (Addendum)
INCREASE FUROSEMIDE TO 40 MG ONCE DAILY X 3 DAYS AND THEN REDUCE TO 20 MG ONCE DAILY= 2 OF THE 20 MG TABLETS ONCE DAILY FOR THE NEXT 3 DAYS AND THEN REDUCE TO 1 TABLET DAILY   Your physician recommends that you return for lab work in: ONE WEEK-DO NOT NEED TO FAST   Follow-Up: At [facility], you and your health needs are our priority.  As part of our continuing mission to provide you with exceptional heart care, we have created designated Provider Care Teams.  These Care Teams include your primary Cardiologist (physician) and Advanced Practice Providers (APPs -  Physician Assistants and Nurse Practitioners) who all work together to provide you with the care you need, when you need it.  We recommend signing up for the patient portal called "MyChart".  Sign up information is provided on this After Visit Summary.  MyChart is used to connect with patients for Virtual Visits (Telemedicine).  Patients are able to view lab/test results, encounter notes, upcoming appointments, etc.  Non-urgent messages can be sent to your provider as well.   To learn more about what you can do with MyChart, go to [email or web address].    Your next appointment:   2 month(s)  Provider:   Marjie Skiff PA

## 2023-06-26 ENCOUNTER — Ambulatory Visit (HOSPITAL_COMMUNITY)
Admission: RE | Admit: 2023-06-26 | Discharge: 2023-06-26 | Disposition: A | Discharge status: Home / Self Care | Payer: Medicare HMO | Source: Ambulatory Visit | Attending: Physician Assistant | Admitting: Physician Assistant

## 2023-06-26 ENCOUNTER — Encounter (HOSPITAL_COMMUNITY): Payer: Self-pay | Admitting: Physician Assistant

## 2023-06-26 VITALS — BP 122/76 | HR 104 | Ht 61.0 in | Wt 171.2 lb

## 2023-06-26 DIAGNOSIS — E785 Hyperlipidemia, unspecified: Secondary | ICD-10-CM | POA: Insufficient documentation

## 2023-06-26 DIAGNOSIS — I4819 Other persistent atrial fibrillation: Secondary | ICD-10-CM | POA: Diagnosis not present

## 2023-06-26 DIAGNOSIS — I130 Hypertensive heart and chronic kidney disease with heart failure and stage 1 through stage 4 chronic kidney disease, or unspecified chronic kidney disease: Secondary | ICD-10-CM | POA: Diagnosis not present

## 2023-06-26 DIAGNOSIS — J449 Chronic obstructive pulmonary disease, unspecified: Secondary | ICD-10-CM | POA: Insufficient documentation

## 2023-06-26 DIAGNOSIS — N183 Chronic kidney disease, stage 3 unspecified: Secondary | ICD-10-CM | POA: Insufficient documentation

## 2023-06-26 DIAGNOSIS — I5032 Chronic diastolic (congestive) heart failure: Secondary | ICD-10-CM | POA: Diagnosis not present

## 2023-06-26 DIAGNOSIS — Z7901 Long term (current) use of anticoagulants: Secondary | ICD-10-CM | POA: Insufficient documentation

## 2023-06-26 DIAGNOSIS — D6869 Other thrombophilia: Secondary | ICD-10-CM | POA: Diagnosis not present

## 2023-06-26 DIAGNOSIS — I340 Nonrheumatic mitral (valve) insufficiency: Secondary | ICD-10-CM | POA: Diagnosis not present

## 2023-06-26 DIAGNOSIS — I480 Paroxysmal atrial fibrillation: Secondary | ICD-10-CM | POA: Diagnosis not present

## 2023-06-26 NOTE — Patient Instructions (Addendum)
Cardioversion scheduled for: Wednesday, July 31st   - Arrive at the [facility] and go to admitting at 8am   - Do not eat or drink anything after midnight the night prior to your procedure.   - Take all your morning medication (except diabetic medications) with a sip of water prior to arrival.  - You will not be able to drive home after your procedure.    - Do NOT miss any doses of your blood thinner - if you should miss a dose please notify our office immediately.   - If you feel as if you go back into normal rhythm prior to scheduled cardioversion, please notify our office immediately.   If your procedure is canceled in the cardioversion suite you will be charged a cancellation fee.

## 2023-06-26 NOTE — Progress Notes (Signed)
Primary Care Physician: Agapito Games, MD Primary Cardiologist: Olga Millers, MD Electrophysiologist: None  Referring Physician: Marjie Skiff PA   Rebekah Harrell is a 82 y.o. female with a history of chronic diastolic CHF, mild to moderate MR, hypertension, hyperlipidemia, CKD stage III, COPD, atrial fibrillation who presents for consultation in the [facility]. Previously seen by Rudi Coco NP. Patient was initial diagnosed with atrial fibrillation in 05/2016. She was admitted in 11/2022 with atrial fibrillation with RVR and acute diastolic CHF. Seen in follow up 04/2023 and was back in afib, s/p DCCV on 06/10/23. Patient was back in afib by follow up 06/23/23. Patient is on Eliquis for a CHADS2VASC score of 5.  Today, patient remains in afib with symptoms of palpitations, chest heaviness, and intermittent dizziness. She is concerned that her Symbicort caused her to go into atrial fibrillation. She has been changed to another inhaler for her COPD. No current bleeding issues on anticoagulation.   Today, she denies symptoms of shortness of breath, orthopnea, PND, lower extremity edema, presyncope, syncope, snoring, daytime somnolence, bleeding, or neurologic sequela. The patient is tolerating medications without difficulties and is otherwise without complaint today.    Atrial Fibrillation Risk Factors:  she does not have symptoms or diagnosis of sleep apnea. she does not have a history of rheumatic fever. she does not have a history of alcohol use.   Atrial Fibrillation Management history:  Previous antiarrhythmic drugs: sotalol Previous cardioversions: 06/10/23 Previous ablations: none Anticoagulation history: Eliquis  ROS- All systems are reviewed and negative except as per the HPI above.   Physical Exam: Ht 5\' 1"  (1.549 m)   Wt 77.7 kg   BMI 32.35 kg/m   GEN: Well nourished, well developed in no acute distress NECK: No JVD; No  carotid bruits CARDIAC: Irregularly irregular rate and rhythm, no murmurs, rubs, gallops RESPIRATORY:  Clear to auscultation without rales, wheezing or rhonchi  ABDOMEN: Soft, non-tender, non-distended EXTREMITIES:  No edema; No deformity   Wt Readings from Last 3 Encounters:  06/26/23 77.7 kg  06/23/23 78.7 kg  06/18/23 78.9 kg     EKG today demonstrates  Afib Vent. rate 104 BPM PR interval * ms QRS duration 88 ms QT/QTcB 372/489 ms  Echo 06/09/23 demonstrated   1. Left ventricular ejection fraction, by estimation, is 50 to 55%. The  left ventricle has low normal function. The left ventricle has no regional  wall motion abnormalities. There is mild left ventricular hypertrophy.  Left ventricular diastolic parameters are indeterminate.   2. Right ventricular systolic function is mildly reduced. The right  ventricular size is mildly enlarged. There is moderately elevated  pulmonary artery systolic pressure. The estimated right ventricular  systolic pressure is 46.9 mmHg.   3. Left atrial size was moderately dilated.   4. Right atrial size was moderately dilated.   5. The mitral valve is degenerative. Moderate mitral valve regurgitation.  Severe mitral annular calcification.   6. The aortic valve is tricuspid. Aortic valve regurgitation is not  visualized. Aortic valve sclerosis/calcification is present, without any  evidence of aortic stenosis.   7. The inferior vena cava is dilated in size with >50% respiratory  variability, suggesting right atrial pressure of 8 mmHg.     CHA2DS2-VASc Score = 5  The patient's score is based upon: CHF History: 1 HTN History: 1 Diabetes History: 0 Stroke History: 0 Vascular Disease History: 0 Age Score: 2 Gender Score: 1  ASSESSMENT AND PLAN: Persistent Atrial Fibrillation (ICD10:  I48.19) The patient's CHA2DS2-VASc score is 5, indicating a 7.2% annual risk of stroke.   S/p DCCV 06/10/23 with quick return of afib We discussed  rhythm control options today including retrying DCCV now that she is off Symbicort, changing AAD, and ablation. Long term, she is agreeable to consultation with EP to discuss ablation. In the meantime, will arrange for repeat DCCV. Other AAD options include dofetilide and amiodarone although with her COPD it may be best to avoid amiodarone.  Continue Eliquis 5 mg BID Continue sotalol 80 mg BID for now.   Secondary Hypercoagulable State (ICD10:  D68.69) The patient is at significant risk for stroke/thromboembolism based upon her CHA2DS2-VASc Score of 5.  Continue Apixaban (Eliquis).   Chronic diastolic CHF Fluid status appears stable today  Her BNP was 331.7 on 06/05/23 NYHA class II symptoms We discussed Alleviate HF, she is not ready to commit to the study but would like more information, will send to research team.    Valvular heart disease Moderate MR Followed by routine echos  HTN Stable on current regimen   Follow up in the AF clinic post DCCV and then with EP.    Jorja Loa PA-C [facility] [address] [place], [place] [address] [phone]

## 2023-06-29 NOTE — Addendum Note (Signed)
Encounter addended by: Danice Goltz, PA on: 06/29/2023 8:25 AM  Actions taken: Clinical Note Signed

## 2023-07-06 ENCOUNTER — Encounter: Payer: Self-pay | Admitting: Cardiology

## 2023-07-06 ENCOUNTER — Telehealth: Payer: Self-pay | Admitting: Student

## 2023-07-06 NOTE — Telephone Encounter (Signed)
Tiotropium Bromide Monohydrate (SPIRIVA RESPIMAT)  only got samples and doing well nedds script sent to pharmacy and Fluticasone Furoate (ARNUITY ELLIPTA) 100 MCG/ACT AEPB  needs this changed to [facility] and needs 90 day supply so it will be cheaper

## 2023-07-06 NOTE — Telephone Encounter (Signed)
Error

## 2023-07-07 ENCOUNTER — Other Ambulatory Visit: Payer: Self-pay | Admitting: Cardiology

## 2023-07-15 ENCOUNTER — Other Ambulatory Visit: Payer: Self-pay

## 2023-07-15 MED ORDER — ARNUITY ELLIPTA 100 MCG/ACT IN AEPB: 1.0000 | INHALATION_SPRAY | Freq: Every day | RESPIRATORY_TRACT | 11 refills | Status: DC

## 2023-07-15 MED ORDER — SPIRIVA RESPIMAT 1.25 MCG/ACT IN AERS: 2.0000 | INHALATION_SPRAY | Freq: Every day | RESPIRATORY_TRACT | 11 refills | Status: DC

## 2023-07-15 NOTE — Telephone Encounter (Signed)
Called pt, no answer. I did lvmm informing pt medications has been sent into preferred pharmacy

## 2023-07-16 ENCOUNTER — Encounter: Payer: Self-pay | Admitting: Cardiology

## 2023-07-17 ENCOUNTER — Telehealth: Payer: Self-pay | Admitting: Cardiology

## 2023-07-17 NOTE — Telephone Encounter (Signed)
Pt called to afib clinic stated she wanted to have her cardioversion canceled and speak with EP regarding ablation instead. Cardioversion canceled per pt request.

## 2023-07-17 NOTE — Telephone Encounter (Signed)
Patient stated she wants to cancel her cardioversion on 7/31.

## 2023-07-17 NOTE — Telephone Encounter (Signed)
Pt has canceled her Cardioversion and would like to talk to the physician about having an ablation instead.

## 2023-07-20 ENCOUNTER — Encounter (HOSPITAL_COMMUNITY): Payer: Medicare HMO | Admitting: Physician Assistant

## 2023-07-22 ENCOUNTER — Ambulatory Visit (HOSPITAL_COMMUNITY): Admission: RE | Admit: 2023-07-22 | Payer: Medicare HMO | Source: Home / Self Care | Admitting: Cardiology

## 2023-07-22 ENCOUNTER — Encounter (HOSPITAL_COMMUNITY): Admission: RE | Payer: Self-pay | Source: Home / Self Care

## 2023-07-22 SURGERY — CARDIOVERSION
Anesthesia: General

## 2023-07-31 ENCOUNTER — Encounter: Payer: Self-pay | Admitting: Family Medicine

## 2023-07-31 ENCOUNTER — Ambulatory Visit (INDEPENDENT_AMBULATORY_CARE_PROVIDER_SITE_OTHER): Payer: Medicare HMO | Admitting: Family Medicine

## 2023-07-31 VITALS — BP 110/65 | HR 64 | Ht 61.0 in | Wt 178.0 lb

## 2023-07-31 DIAGNOSIS — I10 Essential (primary) hypertension: Secondary | ICD-10-CM

## 2023-07-31 DIAGNOSIS — M4003 Postural kyphosis, cervicothoracic region: Secondary | ICD-10-CM

## 2023-07-31 DIAGNOSIS — J449 Chronic obstructive pulmonary disease, unspecified: Secondary | ICD-10-CM

## 2023-07-31 DIAGNOSIS — M545 Low back pain, unspecified: Secondary | ICD-10-CM

## 2023-07-31 NOTE — Progress Notes (Addendum)
Established Patient Office Visit  Subjective   Patient ID: Rebekah Harrell, female    DOB: 03-22-1941  Age: 82 y.o. MRN: [id number]  Chief Complaint  Patient presents with   Hypertension    HPI  Hypertension- Pt denies chest pain, SOB, dizziness, or heart palpitations.  Taking meds as directed w/o problems.  Denies medication side effects.    F/U COPD - she had a severe flair the day after her cardioversion and ended up having to use her Symbicort and then the next day she woke up back in A-fib.  Talked with the local pharmacist who suggested that the Symbicort was contraindicated with her ablation.  Follow-up with her cardiologist they have switched her to Arnuity and Spiriva.  For atrial fibrillation she was initially scheduled for a cardioversion at the end of July but it was canceled.  She is also noticed in the mirror that she seems to be hunched over she feels like her osteoporosis may actually be getting worse.  Last DEXA on file was from 2020.  She is also been experiencing some persistent low back pain.  It starts out in the mid back and then tends to radiate right and left.  Usually take 3 Tylenol in the morning but does not want to take anything at throughout the day but by mid to late afternoon she is in so much pain she has to sit down.  X-ray of her low back was June 2021 showing lumbar spondylosis and facet hypertrophy at that time she also had some grade 1 anterior listhesis of L4 on L5 with no pars defect.    ROS    Objective:     BP 110/65   Pulse 64   Ht 5\' 1"  (1.549 m)   Wt 178 lb (80.7 kg)   SpO2 95%   BMI 33.63 kg/m    Physical Exam Vitals and nursing note reviewed.  Constitutional:      Appearance: She is well-developed.  HENT:     Head: Normocephalic and atraumatic.  Cardiovascular:     Rate and Rhythm: Normal rate. Rhythm irregular.     Heart sounds: Normal heart sounds.  Pulmonary:     Effort: Pulmonary effort is normal.     Breath  sounds: Normal breath sounds.  Musculoskeletal:     Comments: Lumbar flexion, extension, rotation and sidebending.  Though she did have a pulling sensation with sidebending to the right and the left on both sides.  Skin:    General: Skin is warm and dry.  Neurological:     Mental Status: She is alert and oriented to person, place, and time.     Comments: Patellar reflexes 0+ bilaterally.  Psychiatric:        Behavior: Behavior normal.      No results found for any visits on 07/31/23.    The ASCVD Risk score (Arnett DK, et al., 2019) failed to calculate for the following reasons:   The 2019 ASCVD risk score is only valid for ages 8 to 67    Assessment & Plan:   Problem List Items Addressed This Visit       Cardiovascular and Mediastinum   HYPERTENSION, BENIGN ESSENTIAL - Primary    Well controlled. Continue current regimen. Follow up in  92mo         Respiratory   COPD (chronic obstructive pulmonary disease) (HCC)    Currently Following with pulmonary.  Currently on Arnuity and Spiriva and doing really well.  Other Visit Diagnoses     Acute midline low back pain without sciatica       Relevant Orders   DG Lumbar Spine Complete   Postural kyphosis of cervicothoracic region       Relevant Orders   DG Bone Density      Kyphosis due for updated bone density.  She does currently take a calcium and vitamin D supplement separately.  She is not currently on any type of treatment or bone builder.  Low back pain-will get updated films today.  In the meantime recommend a trial of lidocaine patches.  May continue with Tylenol first in the morning.  She wants to know if she could also wear a waist support that would be reasonable as long as she is not wearing it continuously.  Also okay to consider chiropractic care.  Return in about 6 months (around 01/31/2024) for Hypertension.    Nani Gasser, MD

## 2023-07-31 NOTE — Assessment & Plan Note (Signed)
Well controlled. Continue current regimen. Follow up in  6 mo  

## 2023-07-31 NOTE — Assessment & Plan Note (Addendum)
Currently Following with pulmonary.  Currently on Arnuity and Spiriva and doing really well.

## 2023-08-06 ENCOUNTER — Other Ambulatory Visit: Payer: Self-pay | Admitting: Cardiology

## 2023-08-09 ENCOUNTER — Encounter: Payer: Self-pay | Admitting: Family Medicine

## 2023-08-09 DIAGNOSIS — I10 Essential (primary) hypertension: Secondary | ICD-10-CM

## 2023-08-10 ENCOUNTER — Telehealth: Payer: Self-pay | Admitting: Student

## 2023-08-10 MED ORDER — HYDRALAZINE HCL 50 MG PO TABS: 50.0000 mg | ORAL_TABLET | Freq: Three times a day (TID) | ORAL | Status: DC

## 2023-08-10 MED ORDER — SPIRIVA RESPIMAT 1.25 MCG/ACT IN AERS: 2.0000 | INHALATION_SPRAY | Freq: Every day | RESPIRATORY_TRACT | Status: DC

## 2023-08-10 NOTE — Telephone Encounter (Signed)
Hydralazine added back to medication list.  The plain films she can go and get in a time but guess the plan was for her to get those done at her convenience.  The bone density have asked him to call and schedule.  But when she is getting the plain films done she can always schedule them at that time.  I will also update her note to correct to cardioversion.

## 2023-08-10 NOTE — Telephone Encounter (Signed)
New prescription has been sent in to pharmacy.  Nothing further needed.

## 2023-08-10 NOTE — Telephone Encounter (Signed)
[facility] calling on Spiriva refill. Req has been fax'd but not filled.

## 2023-08-17 ENCOUNTER — Telehealth: Payer: Self-pay | Admitting: Pulmonary Disease

## 2023-08-17 NOTE — Telephone Encounter (Signed)
See last signed encounter where Dr. Isaiah Serge sent this in.

## 2023-08-17 NOTE — Telephone Encounter (Signed)
PT states Spiriva was RX was not rec'd by [facility] even tho Dr. Isaiah Serge sent in on the 19th.   Her # is [phone]

## 2023-08-18 ENCOUNTER — Other Ambulatory Visit: Payer: Self-pay | Admitting: Pulmonary Disease

## 2023-08-18 MED ORDER — SPIRIVA RESPIMAT 1.25 MCG/ACT IN AERS: 2.0000 | INHALATION_SPRAY | Freq: Every day | RESPIRATORY_TRACT | 2 refills | Status: DC

## 2023-08-25 ENCOUNTER — Encounter: Payer: Self-pay | Admitting: Cardiology

## 2023-08-25 ENCOUNTER — Ambulatory Visit: Payer: Medicare HMO | Attending: Cardiology | Admitting: Cardiology

## 2023-08-25 VITALS — BP 116/70 | HR 109 | Ht 61.0 in | Wt 178.0 lb

## 2023-08-25 DIAGNOSIS — Z79899 Other long term (current) drug therapy: Secondary | ICD-10-CM | POA: Diagnosis not present

## 2023-08-25 DIAGNOSIS — I10 Essential (primary) hypertension: Secondary | ICD-10-CM | POA: Diagnosis not present

## 2023-08-25 DIAGNOSIS — D6869 Other thrombophilia: Secondary | ICD-10-CM | POA: Diagnosis not present

## 2023-08-25 DIAGNOSIS — I4819 Other persistent atrial fibrillation: Secondary | ICD-10-CM | POA: Diagnosis not present

## 2023-08-25 NOTE — Progress Notes (Signed)
Electrophysiology Office Note:   Date:  08/25/2023  ID:  [name], DOB 1941-07-24, MRN [id number]  Primary Cardiologist: Olga Millers, MD Electrophysiologist: Regan Lemming, MD      History of Present Illness:   Rebekah Harrell is a 82 y.o. female with h/o Diastolic heart failure, hypertension, hyperlipidemia, CKD stage III, COPD, atrial fibrillation seen today for  for Electrophysiology evaluation of atrial fibrillation at the request of Jorja Loa.    She was diagnosed with atrial fibrillation in 2017.  She admitted with atrial fibrillation rapid rates May 2024.  Unfortunately despite cardioversion, she has continued to have episodes of atrial fibrillation.  She has palpitations, chest heaviness, intermittent dizziness.  She had a cardioversion a few months ago, but only was in normal rhythm for a few hours.  During those few hours, she felt well with much improved energy and less shortness of breath.  Since then, she has continued to have shortness of breath and fatigue that she attributes to her atrial fibrillation.  Review of systems complete and found to be negative unless listed in HPI.   EP Information / Studies Reviewed:    EKG is ordered today. Personal review as below.  EKG Interpretation Date/Time:  Tuesday August 25 2023 09:34:04 EDT Ventricular Rate:  109 PR Interval:    QRS Duration:  86 QT Interval:  380 QTC Calculation: 511 R Axis:   99  Text Interpretation: Atrial fibrillation with rapid ventricular response Rightward axis Nonspecific ST and T wave abnormality When compared with ECG of 26-Jun-2023 10:44, No significant change since last tracing Confirmed by Kaan Tosh ([id number]) on 08/25/2023 9:46:31 AM     Risk Assessment/Calculations:    CHA2DS2-VASc Score = 5   This indicates a 7.2% annual risk of stroke. The patient's score is based upon: CHF History: 1 HTN History: 1 Diabetes History: 0 Stroke History: 0 Vascular Disease History:  0 Age Score: 2 Gender Score: 1             Physical Exam:   VS:  BP 116/70 (BP Location: Left Arm, Patient Position: Sitting, Cuff Size: Large)   Pulse (!) 109   Ht 5\' 1"  (1.549 m)   Wt 178 lb (80.7 kg)   SpO2 96%   BMI 33.63 kg/m    Wt Readings from Last 3 Encounters:  08/25/23 178 lb (80.7 kg)  07/31/23 178 lb (80.7 kg)  06/26/23 171 lb 3.2 oz (77.7 kg)     GEN: Well nourished, well developed in no acute distress NECK: No JVD; No carotid bruits CARDIAC: Irregularly irregular rate and rhythm, no murmurs, rubs, gallops RESPIRATORY:  Clear to auscultation without rales, wheezing or rhonchi  ABDOMEN: Soft, non-tender, non-distended EXTREMITIES:  No edema; No deformity   ASSESSMENT AND PLAN:    1.  Persistent atrial fibrillation: Currently on sotalol.  Despite antiarrhythmics, she has had continued episodes of atrial fibrillation.  She would benefit from alternative rhythm control.  She would like to avoid medications.  Due to that, we Elanah Osmanovic plan for ablation.  Risk and benefits of been discussed.  She understands the risks and is agreed to the procedure.  Risk, benefits, and alternatives to EP study and radiofrequency/pulse field ablation for afib were also discussed in detail today. These risks include but are not limited to stroke, bleeding, vascular damage, tamponade, perforation, damage to the esophagus, lungs, and other structures, pulmonary vein stenosis, worsening renal function, and death. The patient understands these risk and wishes to proceed.  We Keyairra Kolinski therefore proceed with catheter ablation at the next available time.  Carto, ICE, anesthesia are requested for the procedure.  Cartier Mapel also obtain CT PV protocol prior to the procedure to exclude LAA thrombus and further evaluate atrial anatomy.  2.  Secondary hypercoagulable state: Currently on Eliquis for atrial fibrillation  3.  Chronic diastolic heart failure: No obvious volume overload.  4.  Moderate mitral  regurgitation: Plan per primary cardiology  5.  Hypertension: Currently well-controlled  6.  High risk medication monitoring: QTc remained stable on sotalol.  Follow up with Dr. Elberta Fortis as usual post procedure  Signed, Lanyla Costello Jorja Loa, MD

## 2023-08-25 NOTE — Addendum Note (Signed)
Addended by: Baird Lyons on: 08/25/2023 11:11 AM   Modules accepted: Orders

## 2023-08-25 NOTE — Patient Instructions (Signed)
Medication Instructions:  Your physician recommends that you continue on your current medications as directed. Please refer to the Current Medication list given to you today.  *If you need a refill on your cardiac medications before your next appointment, please call your pharmacy*   Lab Work: Pre procedure labs -- see procedure instruction letter:  BMP & CBC  If you have labs (blood work) drawn today and your tests are completely normal, you will receive your results only [id number] Message (if you have MyChart). Otherwise no news is good news. If you have any lab test that is abnormal and we need to change your treatment, we will call you to review the results.   Testing/Procedures: Your physician has requested that you have cardiac CT within 10 days PRIOR to your ablation. Cardiac computed tomography (CT) is a painless test that uses an x-ray machine to take clear, detailed pictures of your heart.  Please follow instruction below located under "other instructions". You will get a call from our office to schedule the date for this test.  Your physician has recommended that you have an ablation. Catheter ablation is a medical procedure used to treat some cardiac arrhythmias (irregular heartbeats). During catheter ablation, a long, thin, flexible tube is put into a blood vessel in your groin (upper thigh), or neck. This tube is called an ablation catheter. It is then guided to your heart through the blood vessel. Radio frequency waves destroy small areas of heart tissue where abnormal heartbeats may cause an arrhythmia to start.   You will be scheduled for 12/08/23.  The EP scheduler, April, will be in touch with procedure instructions.   Follow-Up: At [facility], you and your health needs are our priority.  As part of our continuing mission to provide you with exceptional heart care, we have created designated Provider Care Teams.  These Care Teams include your primary Cardiologist  (physician) and Advanced Practice Providers (APPs -  Physician Assistants and Nurse Practitioners) who all work together to provide you with the care you need, when you need it.  Your next appointment:   1 month(s) after your ablation  The format for your next appointment:   In Person  Provider:   AFib clinic   Thank you for choosing [facility]!!   Dory Horn, RN [phone]    Other Instructions   Cardiac Ablation Cardiac ablation is a procedure to destroy (ablate) some heart tissue that is sending bad signals. These bad signals cause problems in heart rhythm. The heart has many areas that make these signals. If there are problems in these areas, they can make the heart beat in a way that is not normal. Destroying some tissues can help make the heart rhythm normal. Tell your doctor about: Any allergies you have. All medicines you are taking. These include vitamins, herbs, eye drops, creams, and over-the-counter medicines. Any problems you or family members have had with medicines that make you fall asleep (anesthetics). Any blood disorders you have. Any surgeries you have had. Any medical conditions you have, such as kidney failure. Whether you are pregnant or may be pregnant. What are the risks? This is a safe procedure. But problems may occur, including: Infection. Bruising and bleeding. Bleeding into the chest. Stroke or blood clots. Damage to nearby areas of your body. Allergies to medicines or dyes. The need for a pacemaker if the normal system is damaged. Failure of the procedure to treat the problem. What happens before the procedure? Medicines Ask  your doctor about: Changing or stopping your normal medicines. This is important. Taking aspirin and ibuprofen. Do not take these medicines unless your doctor tells you to take them. Taking other medicines, vitamins, herbs, and supplements. General instructions Follow instructions from your doctor about  what you cannot eat or drink. Plan to have someone take you home from the hospital or clinic. If you will be going home right after the procedure, plan to have someone with you for 24 hours. Ask your doctor what steps will be taken to prevent infection. What happens during the procedure?  An IV tube will be put into one of your veins. You will be given a medicine to help you relax. The skin on your neck or groin will be numbed. A cut (incision) will be made in your neck or groin. A needle will be put through your cut and into a large vein. A tube (catheter) will be put into the needle. The tube will be moved to your heart. Dye may be put through the tube. This helps your doctor see your heart. Small devices (electrodes) on the tube will send out signals. A type of energy will be used to destroy some heart tissue. The tube will be taken out. Pressure will be held on your cut. This helps stop bleeding. A bandage will be put over your cut. The exact procedure may vary among doctors and hospitals. What happens after the procedure? You will be watched until you leave the hospital or clinic. This includes checking your heart rate, breathing rate, oxygen, and blood pressure. Your cut will be watched for bleeding. You will need to lie still for a few hours. Do not drive for 24 hours or as long as your doctor tells you. Summary Cardiac ablation is a procedure to destroy some heart tissue. This is done to treat heart rhythm problems. Tell your doctor about any medical conditions you may have. Tell him or her about all medicines you are taking to treat them. This is a safe procedure. But problems may occur. These include infection, bruising, bleeding, and damage to nearby areas of your body. Follow what your doctor tells you about food and drink. You may also be told to change or stop some of your medicines. After the procedure, do not drive for 24 hours or as long as your doctor tells you. This  information is not intended to replace advice given to you by your health care provider. Make sure you discuss any questions you have with your health care provider. Document Revised: 02/28/2022 Document Reviewed: 11/10/2019 Elsevier Patient Education  2023 [employer] Inc[employer]   Cardiac Ablation, Care After  This sheet gives you information about how to care for yourself after your procedure. Your health care provider may also give you more specific instructions. If you have problems or questions, contact your health care provider. What can I expect after the procedure? After the procedure, it is common to have: Bruising around your puncture site. Tenderness around your puncture site. Skipped heartbeats. If you had an atrial fibrillation ablation, you may have atrial fibrillation during the first several months after your procedure.  Tiredness (fatigue).  Follow these instructions at home: Puncture site care  Follow instructions from your health care provider about how to take care of your puncture site. Make sure you: If present, leave stitches (sutures), skin glue, or adhesive strips in place. These skin closures may need to stay in place for up to 2 weeks. If adhesive strip edges start  to loosen and curl up, you may trim the loose edges. Do not remove adhesive strips completely unless your health care provider tells you to do that. If a large square bandage is present, this may be removed 24 hours after surgery.  Check your puncture site every day for signs of infection. Check for: Redness, swelling, or pain. Fluid or blood. If your puncture site starts to bleed, lie down on your back, apply firm pressure to the area, and contact your health care provider. Warmth. Pus or a bad smell. A pea or small marble sized lump at the site is normal and can take up to three months to resolve.  Driving Do not drive for at least 4 days after your procedure or however long your health care provider  recommends. (Do not resume driving if you have previously been instructed not to drive for other health reasons.) Do not drive or use heavy machinery while taking prescription pain medicine. Activity Avoid activities that take a lot of effort for at least 7 days after your procedure. Do not lift anything that is heavier than 5 lb (4.5 kg) for one week.  No sexual activity for 1 week.  Return to your normal activities as told by your health care provider. Ask your health care provider what activities are safe for you. General instructions Take over-the-counter and prescription medicines only as told by your health care provider. Do not use any products that contain nicotine or tobacco, such as cigarettes and e-cigarettes. If you need help quitting, ask your health care provider. You may shower after 24 hours, but Do not take baths, swim, or use a hot tub for 1 week.  Do not drink alcohol for 24 hours after your procedure. Keep all follow-up visits as told by your health care provider. This is important. Contact a health care provider if: You have redness, mild swelling, or pain around your puncture site. You have fluid or blood coming from your puncture site that stops after applying firm pressure to the area. Your puncture site feels warm to the touch. You have pus or a bad smell coming from your puncture site. You have a fever. You have chest pain or discomfort that spreads to your neck, jaw, or arm. You have chest pain that is worse with lying on your back or taking a deep breath. You are sweating a lot. You feel nauseous. You have a fast or irregular heartbeat. You have shortness of breath. You are dizzy or light-headed and feel the need to lie down. You have pain or numbness in the arm or leg closest to your puncture site. Get help right away if: Your puncture site suddenly swells. Your puncture site is bleeding and the bleeding does not stop after applying firm pressure to the  area. These symptoms may represent a serious problem that is an emergency. Do not wait to see if the symptoms will go away. Get medical help right away. Call your local emergency services (911 in the [place] Do not drive yourself to the hospital. Summary After the procedure, it is normal to have bruising and tenderness at the puncture site in your groin, neck, or forearm. Check your puncture site every day for signs of infection. Get help right away if your puncture site is bleeding and the bleeding does not stop after applying firm pressure to the area. This is a medical emergency. This information is not intended to replace advice given to you by your health care provider. Make sure  you discuss any questions you have with your health care provider.

## 2023-08-26 ENCOUNTER — Ambulatory Visit (INDEPENDENT_AMBULATORY_CARE_PROVIDER_SITE_OTHER): Payer: Medicare HMO

## 2023-08-26 ENCOUNTER — Other Ambulatory Visit: Payer: Self-pay | Admitting: Student

## 2023-08-26 DIAGNOSIS — M4003 Postural kyphosis, cervicothoracic region: Secondary | ICD-10-CM

## 2023-08-26 DIAGNOSIS — M5135 Other intervertebral disc degeneration, thoracolumbar region: Secondary | ICD-10-CM | POA: Diagnosis not present

## 2023-08-26 DIAGNOSIS — M85852 Other specified disorders of bone density and structure, left thigh: Secondary | ICD-10-CM | POA: Diagnosis not present

## 2023-08-26 DIAGNOSIS — G8929 Other chronic pain: Secondary | ICD-10-CM | POA: Diagnosis not present

## 2023-08-26 DIAGNOSIS — Z780 Asymptomatic menopausal state: Secondary | ICD-10-CM | POA: Diagnosis not present

## 2023-08-26 DIAGNOSIS — M545 Low back pain, unspecified: Secondary | ICD-10-CM

## 2023-08-26 DIAGNOSIS — M4316 Spondylolisthesis, lumbar region: Secondary | ICD-10-CM | POA: Diagnosis not present

## 2023-08-26 NOTE — Telephone Encounter (Signed)
Refill request for Eliquis received. Indication: PAF Last office visit: 08/25/23  Carleene Mains MD Scr: [phone] on 06/05/23  Epic Age: 82 Weight: 80.7kg  Based on above findings Eliquis 5mg  twice daily is the appropriate dose.  Refill approved.

## 2023-08-26 NOTE — Progress Notes (Signed)
Cardiology Office Note:    Date:  09/01/2023   ID:  [name], DOB 11-20-1941, MRN [id number]  PCP:  Agapito Games, MD  Cardiologist:  Olga Millers, MD  Electrophysiologist:  Regan Lemming, MD   Referring MD: Agapito Games, *   Chief Complaint: follow-up of atrial fibrillation  History of Present Illness:    Rebekah Harrell is a 82 y.o. female with a history of persistent atrial fibrillation on Eliquis, chronic diastolic CHF, mild to moderate MR, hypertension, hyperlipidemia, CKD stage III, COPD, and GERD who is followed by Dr. Jens Som and presents today for atrial fibrillation.   Patient was initial diagnosed with atrial fibrillation in 05/2016. She was admitted in 11/2022 with atrial fibrillation with RVR and acute diastolic CHF. Echo showed LVEF of 50-55% with normal wall motion and moderate LVH, mildly reduced RV function, and mild MR. She was started on Sotalol and converted to sinus rhythm. Last Echo in 03/2020 showed LVEF of 55-60% with normal wall motion and grade 2 diastolic dysfunction, normal RV function, moderate left atrial enlargement, and mild to moderate MR. She was last seen by Dr. Jens Som in 12/2021 at which time she reported dyspnea on exertion but was otherwise doing well from a cardiac standpoint. Of note, she discontinued Eliquis on her own due to risk of bleeding and expense.    She was seen by me in 04/2023 and was back in atrial fibrillation at that time. She reported that she felt like she had been back in atrial fibrillation for the past month and noted some chest heaviness. She was restarted on Eliquis and underwent successful DCCV on 06/10/2023 with restoration of sinus rhythm. Unfortunately, she had early return of atrial fibrillation that seemed to be related to use of her Symbicort inhaler. She reported that she felt great for 1.5 days with complete resolution of chest heaviness but then developed significant shortness of breath after  not taking her Symbicort inhaler for several days. She felt like this was due to her COPD so she used her Symbicort inhaler and felt like this sent her back into atrial fibrillation. She again had associated chest heaviness with this. [facility] and repeat DCCV was initially arranged and scheduled for the end of 06/2023. However, this was later cancelled due to concerns about the cost and the feeling that an ablation would be the best treatment options. She was seen by Dr. Elberta Fortis on 08/25/2023 of this so she was see by Dr. Elberta Fortis and ablation was scheduled for 12/08/2023.   Patient presents today for follow-up. Here alone. Not much has changed since I last saw her in 04/2023. She has occasional palpitations that she describes as a fluttering sensation but usually does not have any significant palpitations. She has chronic dyspnea on exertion, especially with activity such as walking up an incline/ stairs, due to her COPD but this is not new and is stable. She has stable orthopnea but no PND. She denies any other shortness of breath at rest. She continues to have some mild intermittent chest heaviness/ aching that comes and go (but she states it is not a pain) which seems to be related to her atrial fibrillation. Do not think this is angina. This is stable from last visit. She has mild swelling of her right leg which is not new and is stable. She is prescribed Lasix 20mg  daily but states she does not take this on the days that she  has to leave her house due to increased urination. She usually only ends up taking it a couple of times a week.  She notes some mild lightheadedness/ dizziness in the mornings but this improves throughout the day. She also reports significant fatigue and states she sleeps a lot. However, this is not new. This has been going on for a long time and is not worse since she has been back in atrial fibrillation. She asked me to look at her leg during visit. She has this  circular area of hyperpigmentation/ slight erythema on the lateral aspect of her mid left calf. It is not warm to the touch, painful, indurated, or swollen. She is not sure how long this has been there.  Unfortunately, she is in the donut hole for her Eliquis and is currently not able to afford this. She was recently supplied samples and she has 2 weeks of this left. However, she was off her Eliquis for about 4 days.    EKGs/Labs/Other Studies Reviewed:    The following studies were reviewed:  Echocardiogram 06/09/2023: Impressions: 1. Left ventricular ejection fraction, by estimation, is 50 to 55%. The  left ventricle has low normal function. The left ventricle has no regional  wall motion abnormalities. There is mild left ventricular hypertrophy.  Left ventricular diastolic  parameters are indeterminate.   2. Right ventricular systolic function is mildly reduced. The right  ventricular size is mildly enlarged. There is moderately elevated  pulmonary artery systolic pressure. The estimated right ventricular  systolic pressure is 46.9 mmHg.   3. Left atrial size was moderately dilated.   4. Right atrial size was moderately dilated.   5. The mitral valve is degenerative. Moderate mitral valve regurgitation.  Severe mitral annular calcification.   6. The aortic valve is tricuspid. Aortic valve regurgitation is not  visualized. Aortic valve sclerosis/calcification is present, without any  evidence of aortic stenosis.   7. The inferior vena cava is dilated in size with >50% respiratory  variab    EKG:  EKG not ordered today.   Recent Labs: 04/30/2023: ALT 10; TSH 2.40 06/05/2023: BNP 331.7; BUN 13; Creatinine, Ser 0.95; Hemoglobin 13.8; Platelets 249; Potassium 4.7; Sodium 143  Recent Lipid Panel    Component Value Date/Time   CHOL 154 08/27/2022 1218   CHOL 148 11/18/2017 1205   TRIG 215 (H) 08/27/2022 1218   HDL 47 (L) 08/27/2022 1218   HDL 38 (L) 11/18/2017 1205   CHOLHDL 3.3  08/27/2022 1218   VLDL 26 06/19/2016 1136   LDLCALC 76 08/27/2022 1218    Physical Exam:    Vital Signs: BP 120/76   Pulse 99   Ht 5\' 3"  (1.6 m)   Wt 179 lb 12.8 oz (81.6 kg)   SpO2 97%   BMI 31.85 kg/m     Wt Readings from Last 3 Encounters:  09/01/23 179 lb 12.8 oz (81.6 kg)  08/25/23 178 lb (80.7 kg)  07/31/23 178 lb (80.7 kg)     General: 82 y.o. Caucasian female in no acute distress. HEENT: Normocephalic and atraumatic. Sclera clear.  Heart: Irregularly irregular rhythm with normal rate. Distinct S1 and S2. No murmurs, gallops, or rubs.  Lungs: No increased work of breathing. Clear to ausculation bilaterally. No wheezes, rhonchi, or rales.  Extremities: No to trace lower extremity edema bilaterally.     Skin: Warm and dry. Circular area of hyperpigmentation/ mild erythema on lateral aspect of left mid calf that is not painful, indurated, or warm to  the touch. Neuro: Alert and oriented x3. No focal deficits. Psych: Normal affect. Responds appropriately.   Assessment:    1. Persistent atrial fibrillation (HCC)   2. Chronic diastolic CHF (congestive heart failure) (HCC)   3. Chest heaviness   4. Moderate mitral regurgitation   5. Primary hypertension   6. Hyperlipidemia, unspecified hyperlipidemia type   7. Stage 3 chronic kidney disease, unspecified whether stage 3a or 3b CKD (HCC)   8. Discoloration of skin     Plan:    Persistent Atrial Fibrillation Initially diagnosed in 2017 and has been on Sotalol. She has done very well on the Sotalol until recently. She was noted to be back in atrial fibrillation at PCP's office in early May. She underwent successful DCCV on 06/10/2023 with restoration of sinus rhythm. Unfortunately, she had early return of atrial fibrillation within a few days which seemed to be correlated to use of her Symbicort inhaler. She is now on a different inhaler. She has been referred to EP and plan is to proceed with an ablation in 11/2023. - Rate  controlled. Rates in the high 90s in the office today. - Continue Sotalol 80mg  twice daily.  - Hesitant to add beta-blocker or long acting Diltiazem because she is usually bradycardic when in sinus rhythm.  - Continue anticoagulation with Eliquis 5mg  twice daily.  - Also followed by EP. Plan is for an ablation on 12/08/2023.   Of note, patient has reached her donut hole for Eliquis and is unable to afford this. She missed 4 days of Eliquis because of this but was recently given a 2 week sample. She has completed the Patient Assistant forms and we will fax these in for her today. Advised patient to let us know if she has not heard anything back from this in 1 week. Did discuss that the only other option if she does not get Patient Assistance would be Coumadin as I do not think Xarelto would be much cheaper for her.    Chronic Diastolic CHF Recent Echo in 05/2023 showed LVEF of 50-55% with normal wall motion and mild LVH, mildly enlarged RV with mildly reduced systolic function and moderately elevated PASP of 46.9 mmHg, moderate biatrial enlargement, and severe MAC with moderate MR.  - Patient has chronic dyspnea on exertion due to underlying COPD. Euvolemic on exam. - She is prescribed Lasix 20mg  daily but she states she only takes this on the days she does not have to leave the house due to frequent urination. She normally only takes it a couple of times a week. This seems to be working for her so OK to continue. - Asked her to keep a close eye on her ways and monitor for increased edema or worsening shortness of breath which could indicated a CHF exacerbation in setting of her atrial fibrillation.   Chest Heaviness  She continues to have some chest heaviness/ aching ("not chest pain") that comes and goes. These seems to be associated with her atrial fibrillation as it resolved with restoration of sinus rhythm and returned when she went back into atrial fibrillation. This is stable from last visit. I  do not think this is angina. I think it is due to her atrial fibrillation. Do not think any ischemic evaluation is necessary at this time. Unfortunately, she will not be able to ache her ablation until 11/2023. Advised her to let us know if she has any new or worsening symptoms.    Moderate Mitral Regurgitation  Noted on  Echo in 05/2023. - Will continue to monitor with routine serial Echos.    Hypertension BP well controlled.  - Continue current medications: Amlodipine 10mg  daily and Hydralazine 50mg  three times daily.    Hyperlipidemia Lipid panel in 08/2022: Total Cholesterol 154, Triglycerides 215, HDL 47, LDL 76.  - Continue Crestor 10mg  daily  - Labs followed by PCP.   CKD Stage III Baseline creatinine around 0.9 to 1.2. Stable at 0.95 on recent labs in 05/2023.  Skin Discoloration on Leg Patient has a circular area of hyperpigmentation/ slight erythema on the lateral aspect of her mid left calf. It is not warm to the touch, painful, indurated, or swollen. She is not sure how long this has been there. - No asymmetric left lower extremity edema (in fact, her right leg is the one that is usually swollen). She is also on Eliquis. She did miss about 4 days of this due to cost but is back on this. I think a DVT is unlikely. I did offer lower extremity venous dopplers but she declined. - She has good distal pulses and denies any claudication so don't think this is PAD. - Recommended following up with PCP if it does not resolve.   Disposition: Follow up in about 4 months after ablation.   Medication Adjustments/Labs and Tests Ordered: Current medicines are reviewed at length with the patient today.  Concerns regarding medicines are outlined above.  No orders of the defined types were placed in this encounter.  No orders of the defined types were placed in this encounter.   Patient Instructions  Medication Instructions:  The current medical regimen is effective;  continue present plan  and medications as directed. Please refer to the Current Medication list given to you today.  *If you need a refill on your cardiac medications before your next appointment, please call your pharmacy*  Lab Work: NONE If you have labs (blood work) drawn today and your tests are completely normal, you will receive your results only by:  MyChart Message (if you have MyChart) OR  A paper copy in the mail If you have any lab test that is abnormal or we need to change your treatment, we will call you to review the results.  Testing/Procedures: NONE  Follow-Up: At [facility], you and your health needs are our priority.  As part of our continuing mission to provide you with exceptional heart care, we have created designated Provider Care Teams.  These Care Teams include your primary Cardiologist (physician) and Advanced Practice Providers (APPs -  Physician Assistants and Nurse Practitioners) who all work together to provide you with the care you need, when you need it.  Your next appointment:   AFTER ABLATION   Provider:   Olga Millers, MD  or Marjie Skiff, PA-C          Heart Failure Education: Weigh yourself EVERY morning after you go to the bathroom but before you eat or drink anything. Write this number down in a weight log/diary. If you gain 3 pounds overnight or 5 pounds in a week, call the office. Take your medicines as prescribed. If you have concerns about your medications, please call us before you stop taking them.  Eat low salt foods--Limit salt (sodium) to 2000 mg per day. This will help prevent your body from holding onto fluid. Read food labels as many processed foods have a lot of sodium, especially canned goods and prepackaged meats. If you would like some assistance choosing low sodium  foods, we would be happy to set you up with a nutritionist. Limit all fluids for the day to less than 2 liters (64 ounces). Fluid includes all drinks, coffee, juice, ice chips,  soup, jello, and all other liquids. Stay as active as you can everyday. Staying active will give you more energy and make your muscles stronger. Start with 5 minutes at a time and work your way up to 30 minutes a day. Break up your activities--do some in the morning and some in the afternoon. Start with 3 days per week and work your way up to 5 days as you can.  If you have chest pain, feel short of breath, dizzy, or lightheaded, STOP. If you don't feel better after a short rest, call 911. If you do feel better, call the office to let us know you have symptoms with exercise.    Signed, Corrin Parker, PA-C  09/01/2023 5:12 PM    [facility]

## 2023-08-27 ENCOUNTER — Telehealth: Payer: Self-pay | Admitting: Cardiology

## 2023-08-27 DIAGNOSIS — I4819 Other persistent atrial fibrillation: Secondary | ICD-10-CM

## 2023-08-27 MED ORDER — APIXABAN 5 MG PO TABS
5.0000 mg | ORAL_TABLET | Freq: Two times a day (BID) | ORAL | Status: DC
Start: 2023-08-27 — End: 2024-01-15

## 2023-08-27 NOTE — Telephone Encounter (Signed)
Patient is requesting samples. She has been out of Eliquis for 2 days. She has already started the application process for assistance. She is in a doughnut hole

## 2023-08-27 NOTE — Telephone Encounter (Signed)
Pt c/o medication issue:  1. Name of Medication:   apixaban (ELIQUIS) 5 MG TABS tablet   2. How are you currently taking this medication (dosage and times per day)?   As prescribed  3. Are you having a reaction (difficulty breathing--STAT)?  No  4. What is your medication issue?   Patient stated she was told she can qualify for the [facility] program but has to complete paperwor.  Patient stated she has been out of this medication for the last 2 days and wants to get samples.

## 2023-08-27 NOTE — Telephone Encounter (Signed)
Ok to provide 1-2 weeks worth of samples, she should bring in pt assistance application so that it can be completed and submitted.

## 2023-08-27 NOTE — Telephone Encounter (Signed)
Spoke with patient and she is aware 2 weeks worth of samples are at the front desk waiting for her to pick up.

## 2023-08-27 NOTE — Telephone Encounter (Signed)
Sample request for Eliquis received. Indication: Afib  Last office visit: 08/25/23 ([place])  Scr: 0.95 (06/05/23)  Age: 82 Weight: 80.7kg  Eliquis 5mg  BID is appropriate dose.

## 2023-08-28 ENCOUNTER — Encounter: Payer: Self-pay | Admitting: Family Medicine

## 2023-08-28 NOTE — Progress Notes (Signed)
HI Rebekah Harrell  Your bone density shows a T-score of -2.3.  This is in the category of osteopenia or mildly thin bones.  Has been a slight decrease in bone density since her last test in 2020.   The current recommendation for osteopenia (mildly thin bones) treatment includes:   #1 calcium-total of 1200 mg of calcium daily.  If you eat a very calcium rich diet you may be able to obtain that without a supplement.  If not, then I recommend calcium 500 mg twice a day.  There are several products over-the-counter such as Caltrate D and Viactiv chews which are great options that contain calcium and vitamin D. #2 vitamin D-recommend 800 international units daily. #3 exercise-recommend 30 minutes of weightbearing exercise 3 days a week.  Resistance training ,such as doing bands and light weights, can be particularly helpful.

## 2023-09-01 ENCOUNTER — Encounter: Payer: Self-pay | Admitting: Student

## 2023-09-01 ENCOUNTER — Ambulatory Visit: Payer: Medicare HMO | Attending: Student | Admitting: Student

## 2023-09-01 VITALS — BP 120/76 | HR 99 | Ht 63.0 in | Wt 179.8 lb

## 2023-09-01 DIAGNOSIS — E785 Hyperlipidemia, unspecified: Secondary | ICD-10-CM | POA: Diagnosis not present

## 2023-09-01 DIAGNOSIS — I10 Essential (primary) hypertension: Secondary | ICD-10-CM | POA: Diagnosis not present

## 2023-09-01 DIAGNOSIS — I5032 Chronic diastolic (congestive) heart failure: Secondary | ICD-10-CM

## 2023-09-01 DIAGNOSIS — R0789 Other chest pain: Secondary | ICD-10-CM | POA: Diagnosis not present

## 2023-09-01 DIAGNOSIS — L819 Disorder of pigmentation, unspecified: Secondary | ICD-10-CM | POA: Diagnosis not present

## 2023-09-01 DIAGNOSIS — N183 Chronic kidney disease, stage 3 unspecified: Secondary | ICD-10-CM | POA: Diagnosis not present

## 2023-09-01 DIAGNOSIS — I4819 Other persistent atrial fibrillation: Secondary | ICD-10-CM | POA: Diagnosis not present

## 2023-09-01 DIAGNOSIS — I340 Nonrheumatic mitral (valve) insufficiency: Secondary | ICD-10-CM | POA: Diagnosis not present

## 2023-09-01 NOTE — Patient Instructions (Signed)
Medication Instructions:  The current medical regimen is effective;  continue present plan and medications as directed. Please refer to the Current Medication list given to you today.  *If you need a refill on your cardiac medications before your next appointment, please call your pharmacy*  Lab Work: NONE If you have labs (blood work) drawn today and your tests are completely normal, you will receive your results only by:  MyChart Message (if you have MyChart) OR  A paper copy in the mail If you have any lab test that is abnormal or we need to change your treatment, we will call you to review the results.  Testing/Procedures: NONE  Follow-Up: At [facility], you and your health needs are our priority.  As part of our continuing mission to provide you with exceptional heart care, we have created designated Provider Care Teams.  These Care Teams include your primary Cardiologist (physician) and Advanced Practice Providers (APPs -  Physician Assistants and Nurse Practitioners) who all work together to provide you with the care you need, when you need it.  Your next appointment:   AFTER ABLATION   Provider:   Olga Millers, MD  or Marjie Skiff, PA-C          Heart Failure Education: Weigh yourself EVERY morning after you go to the bathroom but before you eat or drink anything. Write this number down in a weight log/diary. If you gain 3 pounds overnight or 5 pounds in a week, call the office. Take your medicines as prescribed. If you have concerns about your medications, please call us before you stop taking them.  Eat low salt foods--Limit salt (sodium) to 2000 mg per day. This will help prevent your body from holding onto fluid. Read food labels as many processed foods have a lot of sodium, especially canned goods and prepackaged meats. If you would like some assistance choosing low sodium foods, we would be happy to set you up with a nutritionist. Limit all fluids for the day  to less than 2 liters (64 ounces). Fluid includes all drinks, coffee, juice, ice chips, soup, jello, and all other liquids. Stay as active as you can everyday. Staying active will give you more energy and make your muscles stronger. Start with 5 minutes at a time and work your way up to 30 minutes a day. Break up your activities--do some in the morning and some in the afternoon. Start with 3 days per week and work your way up to 5 days as you can.  If you have chest pain, feel short of breath, dizzy, or lightheaded, STOP. If you don't feel better after a short rest, call 911. If you do feel better, call the office to let us know you have symptoms with exercise.

## 2023-09-04 ENCOUNTER — Encounter: Payer: Self-pay | Admitting: Family Medicine

## 2023-09-08 NOTE — Progress Notes (Signed)
there is a little bit of worsening of the degenerative disc disease at T12-L1 and L1-L2.  This is closer to your belt line.  And a little bit of worsening at L2-L3 which is a little below your belt line.  There is a shifting of L4 on top of L5 but it does not look like that is new they say it was stable so they actually saw that back in 2021.  So the next step would be to consider either formal physical therapy to help reduce your pain and improve function.  It cannot undo degenerative disc disease but it can help greatly and improving function and reducing pain.  Or I can get you in with one of our sports med/orthopedist for treatment options.

## 2023-09-08 NOTE — Progress Notes (Signed)
OK please schedule with Dr. Karie Schwalbe. Thank you

## 2023-09-14 ENCOUNTER — Encounter: Payer: Self-pay | Admitting: Pulmonary Disease

## 2023-09-14 ENCOUNTER — Ambulatory Visit: Payer: Medicare HMO | Admitting: Pulmonary Disease

## 2023-09-14 VITALS — BP 112/80 | HR 97 | Ht 63.0 in | Wt 179.0 lb

## 2023-09-14 DIAGNOSIS — J4489 Other specified chronic obstructive pulmonary disease: Secondary | ICD-10-CM | POA: Diagnosis not present

## 2023-09-14 DIAGNOSIS — R911 Solitary pulmonary nodule: Secondary | ICD-10-CM | POA: Diagnosis not present

## 2023-09-14 NOTE — Patient Instructions (Addendum)
Nice to meet you  No changes to medication  I did give you some samples of Spiriva, each sample inhaler will last 2 weeks  I ordered repeat CT scan for 1 year follow-up February 2025,  Return to clinic in 5 to 6 months or sooner as needed with Dr. Judeth Horn

## 2023-09-14 NOTE — Progress Notes (Addendum)
Synopsis: Referred for COPD by Agapito Games, *  Subjective:   PATIENT ID: Rebekah Harrell GENDER: female DOB: Jan 28, 1941, MRN: [id number]  Chief Complaint  Patient presents with   Follow-up    Pt s here for Asthma-COPD overlap syndrome F/U visit. Pt complains of SOB while doing activities.   81 y.o. with history of allergy, asthma, COPD, AF, CKD, GERD, hiatal hernia, +ANA with recurrent angioedema of unclear etiology on plaquenil followed by [facility], referred for COPD and worsening DOE. She never had covid-19 infection.  Still dyspneic.  On Arnuity and Spiriva for asthma/COPD overlap.  Concern for LABA therapy exacerbating atrial fibrillation.  He is in permanent atrial fibrillation.  Failed cardioversion.  We discussed trying to maximize her inhaler therapy.  A bit limited with lack of LABA.  However discussed multiple cardiac etiologies including valvular heart disease, atrial fibrillation, pulmonary reasons hypertension as evidenced by increased left atrial size.  HPI initial visit: She says she's had breathing problems ever since she was little, attributes it at least in part to secondhand smoke exposure in past.   Now has DOE where she has stop speaking to catch her breath if she's in conversation however she is able to relate a good history today without interruption. DOE to 100 feet. She has no cough. No orthopnea. Weight has gone up recently. She was on course of prednisone for 10 days over January prescribed by rheumatologist but she noticed no effect on her breathing. Very rare sensation of dysphagia - doesn't frequently get choked on food/liquid. She snores once in a while. No PND. She is sleepy during the day but attributes it to her antihistamine regimen.   She didn't tolerate wixela well due to the dry powder formulation causing cough.   She sees allergist at [facility] in [place] sees Dr. Lacretia Nicks. On a bunch of antihistamines due to recurrent  angioedema of unclear etiology. Has not been on AIT  No family history of lung disease  She worked staging houses in past, Oceanographer in past. Lots of secondhand smoke exposure but she never smoked herself, no vaping/MJ. Has a dog and cat at home.     Past Medical History:  Diagnosis Date   Allergy    Aortic atherosclerosis (HCC)    Arthritis    "lower back; knees" (11/26/2016)   Asthma    Chronic atrial fibrillation (HCC)    CKD (chronic kidney disease), stage III (HCC)    patient unaware of this on 11/26/2016   Complication of anesthesia    "had trouble waking me up in the late 1990's after gallbladder OR"   COPD (chronic obstructive pulmonary disease) (HCC)    GERD (gastroesophageal reflux disease)    Heart murmur    "dx'd when I was a little kid"   History of hiatal hernia    Hyperlipidemia    Hypertension    Hypoglycemia      Family History  Problem Relation Age of Onset   CAD Mother        HEART ATTACK   Colon cancer Father        KIDNEY FAILURE/COLON CANCER   Atrial fibrillation Brother    Healthy Brother    Heart failure Maternal Grandmother    Kidney failure Maternal Grandfather    Emphysema Maternal Grandfather    Heart attack Paternal Grandmother      Past Surgical History:  Procedure Laterality Date   CARDIOVERSION N/A 06/10/2023   Procedure: CARDIOVERSION;  Surgeon:  Little Ishikawa, MD;  Location: [facility] INVASIVE CV LAB;  Service: Cardiovascular;  Laterality: N/A;   CHOLECYSTECTOMY OPEN  1990s   TONSILLECTOMY     TUBAL LIGATION      Social History   Socioeconomic History   Marital status: Divorced    Spouse name: Not on file   Number of children: 3   Years of education: 14   Highest education level: Some college, no degree  Occupational History   Occupation: stages houses for sale    Comment: self employed   Occupation: Retired.  Tobacco Use   Smoking status: Never    Passive exposure: Past   Smokeless tobacco: Never  Vaping  Use   Vaping status: Never Used  Substance and Sexual Activity   Alcohol use: Not Currently   Drug use: No   Sexual activity: Not Currently  Other Topics Concern   Not on file  Social History Narrative   Lives alone. One son lives close by incase she needs anything. She enjoys crafting and decorating.    Social Determinants of Health   Financial Resource Strain: Medium Risk (04/28/2023)   Overall Financial Resource Strain (CARDIA)    Difficulty of Paying Living Expenses: Somewhat hard  Food Insecurity: No Food Insecurity (04/28/2023)   Hunger Vital Sign    Worried About Running Out of Food in the Last Year: Never true    Ran Out of Food in the Last Year: Never true  Transportation Needs: No Transportation Needs (04/28/2023)   PRAPARE - Administrator, Civil Service (Medical): No    Lack of Transportation (Non-Medical): No  Physical Activity: Inactive (04/28/2023)   Exercise Vital Sign    Days of Exercise per Week: 0 days    Minutes of Exercise per Session: 0 min  Stress: Stress Concern Present (04/28/2023)   [employer] of Occupational Health - Occupational Stress Questionnaire    Feeling of Stress : Rather much  Social Connections: Socially Isolated (04/28/2023)   Social Connection and Isolation Panel [NHANES]    Frequency of Communication with Friends and Family: Three times a week    Frequency of Social Gatherings with Friends and Family: Patient declined    Attends Religious Services: Never    Database administrator or Organizations: No    Attends Banker Meetings: Never    Marital Status: Divorced  Catering manager Violence: Not At Risk (01/19/2023)   Humiliation, Afraid, Rape, and Kick questionnaire    Fear of Current or Ex-Partner: No    Emotionally Abused: No    Physically Abused: No    Sexually Abused: No     Allergies  Allergen Reactions   Ace Inhibitors Swelling   Tramadol Other (See Comments)    Night sweats, bad dreams   Atorvastatin  Other (See Comments)    Myalgias   Simvastatin     Myalgia    Celecoxib Other (See Comments)    hair loss   Cetirizine Hcl Other (See Comments)    fatigue   Penicillins Other (See Comments)    Unknown childhood reaction    Sulfonamide Derivatives Rash     Outpatient Medications Prior to Visit  Medication Sig Dispense Refill   amLODipine (NORVASC) 10 MG tablet Take 1 tablet (10 mg total) by mouth daily. 30 tablet 11   apixaban (ELIQUIS) 5 MG TABS tablet Take 1 tablet (5 mg total) by mouth 2 (two) times daily. 28 tablet    CALCIUM PO Take 1 tablet by  mouth at bedtime.     Cholecalciferol 125 MCG (5000 UT) capsule Take 5,000 Units by mouth daily.     famotidine (PEPCID) 20 MG tablet TAKE 1 TABLET AT BEDTIME 90 tablet 3   fexofenadine (ALLEGRA) 180 MG tablet TAKE 1 TABLET EVERY DAY 90 tablet 3   Fluticasone Furoate (ARNUITY ELLIPTA) 100 MCG/ACT AEPB Inhale 1 puff into the lungs daily. 30 each 11   furosemide (LASIX) 20 MG tablet Take 1 tablet (20 mg total) by mouth daily. 90 tablet 3   hydrALAZINE (APRESOLINE) 50 MG tablet Take 1 tablet (50 mg total) by mouth 3 (three) times daily.     hydroxychloroquine (PLAQUENIL) 200 MG tablet TAKE 1 TABLET EVERY DAY 90 tablet 3   Magnesium 200 MG TABS Take 1 tablet (200 mg total) by mouth daily. 60 each    montelukast (SINGULAIR) 10 MG tablet TAKE 1 TABLET AT BEDTIME 90 tablet 1   Multiple Vitamins-Minerals (MULTIVITAMIN PO) Take 1 tablet by mouth at bedtime.     omeprazole (PRILOSEC) 20 MG capsule TAKE 1 CAPSULE EVERY DAY 90 capsule 3   potassium chloride SA (KLOR-CON M) 20 MEQ tablet TAKE 1 TABLET EVERY DAY 90 tablet 3   rosuvastatin (CRESTOR) 10 MG tablet Take 1 tablet (10 mg total) by mouth daily. 90 tablet 3   sotalol (BETAPACE) 80 MG tablet TAKE 1 TABLET THREE TIMES DAILY (Patient taking differently: Take 80 mg by mouth 2 (two) times daily.) 270 tablet 3   Tiotropium Bromide Monohydrate (SPIRIVA RESPIMAT) 1.25 MCG/ACT AERS Inhale 2 puffs into  the lungs daily. 4 each 2   Zinc 50 MG TABS Take 50 mg by mouth daily.     No facility-administered medications prior to visit.       Objective:   Physical Exam:  General appearance: 82 y.o., female, NAD, conversant  Eyes: anicteric sclerae; PERRL, tracking appropriately HENT: NCAT; MMM Neck: Trachea midline; no lymphadenopathy, no JVD Lungs: CTAB, no crackles, no wheeze, with normal respiratory effort CV: RRR, no murmur  Abdomen: Soft, non-tender; non-distended, BS present  Extremities: No peripheral edema, warm Skin: Normal turgor and texture; no rash Psych: Appropriate affect Neuro: Alert and oriented to person and place, no focal deficit     There were no vitals filed for this visit.       on RA BMI Readings from Last 3 Encounters:  09/01/23 31.85 kg/m  08/25/23 33.63 kg/m  07/31/23 33.63 kg/m   Wt Readings from Last 3 Encounters:  09/01/23 179 lb 12.8 oz (81.6 kg)  08/25/23 178 lb (80.7 kg)  07/31/23 178 lb (80.7 kg)     CBC    Component Value Date/Time   WBC 8.7 06/05/2023 1548   WBC 8.4 04/30/2023 1433   RBC 4.80 06/05/2023 1548   RBC 4.88 04/30/2023 1433   HGB 13.8 06/05/2023 1548   HCT 41.4 06/05/2023 1548   PLT 249 06/05/2023 1548   MCV 86 06/05/2023 1548   MCH 28.8 06/05/2023 1548   MCH 27.9 04/30/2023 1433   MCHC 33.3 06/05/2023 1548   MCHC 31.8 (L) 04/30/2023 1433   RDW 14.5 06/05/2023 1548   LYMPHSABS 2,604 04/30/2023 1433   MONOABS 0.7 05/08/2022 1537   EOSABS 143 04/30/2023 1433   BASOSABS 67 04/30/2023 1433    Eos 240  Chest Imaging: CXR 04/06/20 reviewed by me with prominent R hilum  HRCT Chest 6/14 reviewed by me with subtle subpleural reticular opacities, air trapping, RLL 1.49mm ggo, calcified LNs  HRCT Chest 01/22/23  reviewed by me similar to prior  Pulmonary Functions Testing Results:    Latest Ref Rng & Units 07/10/2022    9:53 AM  PFT Results  FVC-Pre L 1.82   FVC-Predicted Pre % 72   FVC-Post L 1.98    FVC-Predicted Post % 79   Pre FEV1/FVC % % 68   Post FEV1/FCV % % 70   FEV1-Pre L 1.23   FEV1-Predicted Pre % 66   FEV1-Post L 1.40   DLCO uncorrected ml/min/mmHg 13.01   DLCO UNC% % 71   DLCO corrected ml/min/mmHg 13.01   DLCO COR %Predicted % 71   DLVA Predicted % 74   TLC L 5.56   TLC % Predicted % 113   RV % Predicted % 149    Personally viewed and interpreted as normal spirometry, no bronchodilator response, air trapping on lung volumes, mildly reduced diffusing capacity   Echocardiogram:   TTE 04/11/20: 1. Left ventricular ejection fraction, by estimation, is 55 to 60%. The  left ventricle has normal function. The left ventricle has no regional  wall motion abnormalities. Left ventricular diastolic parameters are  consistent with Grade II diastolic  dysfunction (pseudonormalization).   2. Right ventricular systolic function is normal. The right ventricular  size is normal. There is mildly elevated pulmonary artery systolic  pressure.   3. Left atrial size was moderately dilated.   4. The mitral valve is normal in structure. Mild to moderate mitral valve  regurgitation. No evidence of mitral stenosis.   5. The aortic valve is normal in structure. Aortic valve regurgitation is  not visualized. No aortic stenosis is present.   6. The inferior vena cava is normal in size with greater than 50%  respiratory variability, suggesting right atrial pressure of 3 mmHg.   7. Compared to echocardiogram from 2017 MR from mild now is mild to  moderate, LA size increased to moderate enlargement from mild. Disatolic  dysfunction.   TTE 06/09/23  1. Left ventricular ejection fraction, by estimation, is 50 to 55%. The  left ventricle has low normal function. The left ventricle has no regional  wall motion abnormalities. There is mild left ventricular hypertrophy.  Left ventricular diastolic  parameters are indeterminate.   2. Right ventricular systolic function is mildly reduced. The  right  ventricular size is mildly enlarged. There is moderately elevated  pulmonary artery systolic pressure. The estimated right ventricular  systolic pressure is 46.9 mmHg.   3. Left atrial size was moderately dilated.   4. Right atrial size was moderately dilated.   5. The mitral valve is degenerative. Moderate mitral valve regurgitation.  Severe mitral annular calcification.   6. The aortic valve is tricuspid. Aortic valve regurgitation is not  visualized. Aortic valve sclerosis/calcification is present, without any  evidence of aortic stenosis.   7. The inferior vena cava is dilated in size with >50% respiratory  variability, suggesting right atrial pressure of 8 mmHg.       Assessment & Plan:   Dyspnea on exertion: Suspect multifactorial but largely due to cardiac disease given significant valvular disease, atrial fibrillation, evidence of pulmonary venous hypertension with dilated left atrium.  Also with possible contribution of asthma.  Air trapping on PFTs.  Tolerating Arnuity, Spiriva for ICS/LAMA therapy.  Avoiding LABA given atrial fibrillation.  However this is unlikely be significant contributor to atrial fibrillation burden.  That is likely largely driven by her valvular heart disease.  Hopefully upcoming A-fib ablation will be helpful.  Lung nodule: 1  cm GGO.  Stable on follow-up scan 01/2023.  Initially noted 05/2022.  Repeat scan 01/2024 ordered.    Karren Burly, MD [facility] 09/14/2023 1:40 PM   I spent 40 minutes in the care of the patient including face-to-face visit, review of records, coordination of care.

## 2023-09-15 ENCOUNTER — Ambulatory Visit: Payer: Medicare HMO | Admitting: Sports Medicine

## 2023-09-18 ENCOUNTER — Other Ambulatory Visit: Payer: Self-pay | Admitting: Cardiology

## 2023-09-18 DIAGNOSIS — I10 Essential (primary) hypertension: Secondary | ICD-10-CM

## 2023-09-22 ENCOUNTER — Ambulatory Visit: Payer: Medicare HMO

## 2023-09-22 ENCOUNTER — Ambulatory Visit (INDEPENDENT_AMBULATORY_CARE_PROVIDER_SITE_OTHER): Payer: Medicare HMO | Admitting: Sports Medicine

## 2023-09-22 ENCOUNTER — Encounter: Payer: Self-pay | Admitting: Sports Medicine

## 2023-09-22 DIAGNOSIS — M7989 Other specified soft tissue disorders: Secondary | ICD-10-CM | POA: Diagnosis not present

## 2023-09-22 DIAGNOSIS — R600 Localized edema: Secondary | ICD-10-CM | POA: Diagnosis not present

## 2023-09-22 DIAGNOSIS — M4316 Spondylolisthesis, lumbar region: Secondary | ICD-10-CM

## 2023-09-22 DIAGNOSIS — R2242 Localized swelling, mass and lump, left lower limb: Secondary | ICD-10-CM

## 2023-09-22 DIAGNOSIS — R2241 Localized swelling, mass and lump, right lower limb: Secondary | ICD-10-CM

## 2023-09-22 MED ORDER — TRAMADOL HCL 50 MG PO TABS
25.0000 mg | ORAL_TABLET | Freq: Three times a day (TID) | ORAL | 0 refills | Status: DC | PRN
Start: 2023-09-22 — End: 2023-10-12

## 2023-09-22 NOTE — Assessment & Plan Note (Addendum)
82 year old female, chronic axial low back pain now with right-sided radicular symptoms, known spondylolisthesis, initially responded to conservative treatment and CBD oil in the past. CBD no longer effective. Unfortunately physical therapy and Tylenol were not effective. Tramadol seemed to work in the past but she did have some intolerable night sweats and bad dreams. On Eliquis, unable to use NSAIDs. We will try half tab tramadol for now. She will continue PT and return to see me in about 6 weeks, MR for interventional planning if not better.

## 2023-09-22 NOTE — Progress Notes (Signed)
    Procedures performed today:    None.  Independent interpretation of notes and tests performed by another provider:   None.  Brief History, Exam, Impression, and Recommendations:    Spondylolisthesis at L4-L68 level 82 year old female, chronic axial low back pain now with right-sided radicular symptoms, known spondylolisthesis, initially responded to conservative treatment and CBD oil in the past. CBD no longer effective. Unfortunately physical therapy and Tylenol were not effective. Tramadol seemed to work in the past but she did have some intolerable night sweats and bad dreams. On Eliquis, unable to use NSAIDs. We will try half tab tramadol for now. She will continue PT and return to see me in about 6 weeks, MR for interventional planning if not better.  Localized swelling of right lower extremity Increasing right lower extremity swelling, positive Homans' sign, adding DVT ultrasound.  Mass of lower leg, left Visible and palpable fullness over the muscle belly of the medial head of the gastrocnemius, adding a soft tissue ultrasound.    ____________________________________________ Ihor Austin. Benjamin Stain, M.D., ABFM., CAQSM., AME. Primary Care and Sports Medicine [facility]  Adjunct Professor of Family Medicine  [employer]  Restaurant manager, fast food

## 2023-09-22 NOTE — Assessment & Plan Note (Signed)
Increasing right lower extremity swelling, positive Homans' sign, adding DVT ultrasound.

## 2023-09-22 NOTE — Assessment & Plan Note (Signed)
Visible and palpable fullness over the muscle belly of the medial head of the gastrocnemius, adding a soft tissue ultrasound.

## 2023-09-26 ENCOUNTER — Other Ambulatory Visit: Payer: Self-pay | Admitting: Family Medicine

## 2023-10-01 ENCOUNTER — Other Ambulatory Visit (HOSPITAL_COMMUNITY): Payer: Self-pay

## 2023-10-01 ENCOUNTER — Telehealth: Payer: Self-pay

## 2023-10-01 MED ORDER — APIXABAN 5 MG PO TABS: 5.0000 mg | ORAL_TABLET | Freq: Two times a day (BID) | ORAL | Status: DC

## 2023-10-01 NOTE — Telephone Encounter (Signed)
Send to pharmacy team to review request for samples

## 2023-10-01 NOTE — Telephone Encounter (Signed)
Got a message regarding issues with BMS application for Eliquis. Patient makes too little per BMS and they require her to apply to ssa.gov first. Called and submitted application with patient this morning. She will get a determination via mail and has been made aware to be on the look out for it. If they approve her this will help cover all her drug costs, if she is denied I can try again with BMS at that point.

## 2023-10-01 NOTE — Telephone Encounter (Signed)
82 yo F, 81.2 kg, SCr 0.95, Last OV 09/01/23  Ok to give samples of Eliquis 5 mg

## 2023-10-01 NOTE — Addendum Note (Signed)
Addended by: Lindell Spar on: 10/01/2023 04:57 PM   Modules accepted: Orders

## 2023-10-01 NOTE — Telephone Encounter (Signed)
1 box of samples left for pick up

## 2023-10-01 NOTE — Telephone Encounter (Signed)
Good morning Cathy, can we get at least 2 weeks of samples for this patient until SSA low income determination comes back?

## 2023-10-01 NOTE — Telephone Encounter (Signed)
Sorry Lynden Ang, please disregard. This is a NL patient.

## 2023-10-12 ENCOUNTER — Telehealth: Payer: Self-pay | Admitting: Sports Medicine

## 2023-10-12 DIAGNOSIS — M4316 Spondylolisthesis, lumbar region: Secondary | ICD-10-CM

## 2023-10-12 MED ORDER — TRAMADOL HCL 50 MG PO TABS
25.0000 mg | ORAL_TABLET | Freq: Three times a day (TID) | ORAL | 0 refills | Status: DC | PRN
Start: 2023-10-12 — End: 2023-11-03

## 2023-10-12 NOTE — Telephone Encounter (Signed)
Done

## 2023-10-12 NOTE — Telephone Encounter (Signed)
Patient called she is requesting a refill for Tramadol 50mg    Pharmacy [facility] on [address] [place] [address]  Phone: [phone]

## 2023-10-18 ENCOUNTER — Other Ambulatory Visit: Payer: Self-pay | Admitting: Family Medicine

## 2023-11-03 ENCOUNTER — Ambulatory Visit (INDEPENDENT_AMBULATORY_CARE_PROVIDER_SITE_OTHER): Payer: Medicare HMO | Admitting: Sports Medicine

## 2023-11-03 ENCOUNTER — Encounter: Payer: Self-pay | Admitting: Sports Medicine

## 2023-11-03 DIAGNOSIS — M4316 Spondylolisthesis, lumbar region: Secondary | ICD-10-CM

## 2023-11-03 MED ORDER — TRAMADOL HCL 50 MG PO TABS
50.0000 mg | ORAL_TABLET | Freq: Two times a day (BID) | ORAL | 1 refills | Status: DC | PRN
Start: 2023-11-03 — End: 2024-05-20

## 2023-11-03 NOTE — Progress Notes (Signed)
    Procedures performed today:    None.  Independent interpretation of notes and tests performed by another provider:   None.  Brief History, Exam, Impression, and Recommendations:    Spondylolisthesis at L4-L5 level Exquisitely pleasant 82 year old female with chronic axial low back pain and right-sided radicular symptoms with known spondylolisthesis, PT and Tylenol not effective, tramadol was working but she had some night sweats. We restarted tramadol at a lower dose, she then was able to ramp up to 50 mg twice a day with no side effects. On Eliquis, unable to use NSAIDs. She will restart her home conditioning, refilling tramadol for use twice daily, we can continue the above plan long-term, return as needed.    ____________________________________________ Ihor Austin. Benjamin Stain, M.D., ABFM., CAQSM., AME. Primary Care and Sports Medicine [facility]  Adjunct Professor of Family Medicine  [employer] [facility] [employer]  Restaurant manager, fast food

## 2023-11-03 NOTE — Assessment & Plan Note (Signed)
Exquisitely pleasant 82 year old female with chronic axial low back pain and right-sided radicular symptoms with known spondylolisthesis, PT and Tylenol not effective, tramadol was working but she had some night sweats. We restarted tramadol at a lower dose, she then was able to ramp up to 50 mg twice a day with no side effects. On Eliquis, unable to use NSAIDs. She will restart her home conditioning, refilling tramadol for use twice daily, we can continue the above plan long-term, return as needed.

## 2023-11-10 ENCOUNTER — Telehealth: Payer: Self-pay

## 2023-11-10 DIAGNOSIS — I4819 Other persistent atrial fibrillation: Secondary | ICD-10-CM

## 2023-11-10 NOTE — Telephone Encounter (Signed)
Pt is scheduled for Afib Ablation with Dr. Elberta Fortis on 12/17 at 12:30.  She will go to [facility] in [place] for labs CT is scheduled on 12/4 at 10:00.   Instruction letters sent via MyChart per pt's request. I reviewed instructions with pt ~ she will review letters and call with any questions or concerns.

## 2023-11-21 ENCOUNTER — Other Ambulatory Visit: Payer: Self-pay | Admitting: Family Medicine

## 2023-11-23 DIAGNOSIS — I4819 Other persistent atrial fibrillation: Secondary | ICD-10-CM | POA: Diagnosis not present

## 2023-11-24 ENCOUNTER — Telehealth: Payer: Self-pay | Admitting: Cardiology

## 2023-11-24 LAB — BASIC METABOLIC PANEL
BUN/Creatinine Ratio: 17 (ref 12–28)
BUN: 21 mg/dL (ref 8–27)
CO2: 22 mmol/L (ref 20–29)
Calcium: 9.1 mg/dL (ref 8.7–10.3)
Chloride: 104 mmol/L (ref 96–106)
Creatinine, Ser: 1.24 mg/dL — ABNORMAL HIGH (ref 0.57–1.00)
Glucose: 92 mg/dL (ref 70–99)
Potassium: 4.1 mmol/L (ref 3.5–5.2)
Sodium: 142 mmol/L (ref 134–144)
eGFR: 43 mL/min/{1.73_m2} — ABNORMAL LOW (ref >59)

## 2023-11-24 LAB — CBC
Hematocrit: 44.5 % (ref 34.0–46.6)
Hemoglobin: 14.2 g/dL (ref 11.1–15.9)
MCH: 28.5 pg (ref 26.6–33.0)
MCHC: 31.9 g/dL (ref 31.5–35.7)
MCV: 89 fL (ref 79–97)
Platelets: 279 10*3/uL (ref 150–450)
RBC: 4.98 x10E6/uL (ref 3.77–5.28)
RDW: 13.8 % (ref 11.7–15.4)
WBC: 9 10*3/uL (ref 3.4–10.8)

## 2023-11-24 NOTE — Telephone Encounter (Signed)
Patient is wanting to know if her CT can be performed in [place] or somewhere closer to where she lives. She reports she was called and advised her copay will be $172 to have performed tomorrow due to it being at a hospital, and states she has never paid that much before. Patient explained she understands this is important, but is concerned with her finical situation. She is requesting callback ASAP due to the test being setup for tomorrow morning. Please advise.

## 2023-11-24 NOTE — Telephone Encounter (Signed)
Advised by H. Cedric Fishman RN that CT department spoke to patient and she is agreeable to proceed.

## 2023-11-25 ENCOUNTER — Ambulatory Visit (HOSPITAL_COMMUNITY)
Admission: RE | Admit: 2023-11-25 | Discharge: 2023-11-25 | Disposition: A | Discharge status: Home / Self Care | Payer: Medicare HMO | Source: Ambulatory Visit | Attending: Cardiology | Admitting: Cardiology

## 2023-11-25 DIAGNOSIS — I4819 Other persistent atrial fibrillation: Secondary | ICD-10-CM | POA: Diagnosis not present

## 2023-11-25 DIAGNOSIS — I700 Atherosclerosis of aorta: Secondary | ICD-10-CM | POA: Diagnosis not present

## 2023-11-25 MED ORDER — IOHEXOL 350 MG/ML SOLN
95.0000 mL | Freq: Once | INTRAVENOUS | Status: AC | PRN
  Administered 2023-11-25: 95 mL via INTRAVENOUS

## 2023-11-26 ENCOUNTER — Telehealth: Payer: Self-pay | Admitting: *Deleted

## 2023-11-26 DIAGNOSIS — E785 Hyperlipidemia, unspecified: Secondary | ICD-10-CM

## 2023-11-26 MED ORDER — ROSUVASTATIN CALCIUM 20 MG PO TABS: 20.0000 mg | ORAL_TABLET | Freq: Every day | ORAL | 3 refills | Status: DC

## 2023-11-26 NOTE — Telephone Encounter (Signed)
Spoke with pt, she is currently taking rosuvastatin 10 mg and wants to increase to 20 mg as she has researched the calcium score and knows her LDL needs to be lower. New script sent to the pharmacy and Lab orders mailed to the pt

## 2023-11-26 NOTE — Telephone Encounter (Signed)
-----   Message from Olga Millers sent at 11/26/2023  1:29 AM EST ----- DC zocor and treat with crestor 40 mg daily; lipids and liver 8 weeks Olga Millers ----- Message ----- From: Regan Lemming, MD Sent: 11/25/2023   4:40 PM EST To: Agapito Games, MD; Baird Lyons, RN; #  CT without LAA thrombus. Coronary calcium per primary cardiology

## 2023-11-30 ENCOUNTER — Other Ambulatory Visit: Payer: Self-pay | Admitting: *Deleted

## 2023-11-30 DIAGNOSIS — E785 Hyperlipidemia, unspecified: Secondary | ICD-10-CM

## 2023-11-30 MED ORDER — ROSUVASTATIN CALCIUM 20 MG PO TABS
20.0000 mg | ORAL_TABLET | Freq: Every day | ORAL | 3 refills | Status: DC
Start: 2023-11-30 — End: 2024-11-03

## 2023-12-04 ENCOUNTER — Telehealth: Payer: Self-pay | Admitting: Cardiology

## 2023-12-04 NOTE — Telephone Encounter (Signed)
Patient states that she just had labs done on 12/2. Do she need to have it done again. Please advise

## 2023-12-07 NOTE — Anesthesia Preprocedure Evaluation (Addendum)
Anesthesia Evaluation  Patient identified by MRN, date of birth, ID band Patient awake    Reviewed: Allergy & Precautions, NPO status , Patient's Chart, lab work & pertinent test results, reviewed documented beta blocker date and time   History of Anesthesia Complications Negative for: history of anesthetic complications  Airway Mallampati: II  TM Distance: >3 FB Neck ROM: Full    Dental  (+) Dental Advisory Given, Teeth Intact   Pulmonary asthma , COPD,  COPD inhaler   Pulmonary exam normal breath sounds clear to auscultation       Cardiovascular hypertension, Pt. on medications and Pt. on home beta blockers pulmonary hypertension+ dysrhythmias Atrial Fibrillation + Valvular Problems/Murmurs MR  Rhythm:Irregular Rate:Normal  Echo 05/2023  1. Left ventricular ejection fraction, by estimation, is 50 to 55%. The left ventricle has low normal function. The left ventricle has no regional wall motion abnormalities. There is mild left ventricular hypertrophy. Left ventricular diastolic parameters are indeterminate.   2. Right ventricular systolic function is mildly reduced. The right ventricular size is mildly enlarged. There is moderately elevated pulmonary artery systolic pressure. The estimated right ventricular systolic pressure is 46.9 mmHg.   3. Left atrial size was moderately dilated.   4. Right atrial size was moderately dilated.   5. The mitral valve is degenerative. Moderate mitral valve regurgitation.  Severe mitral annular calcification.   6. The aortic valve is tricuspid. Aortic valve regurgitation is not visualized. Aortic valve sclerosis/calcification is present, without any evidence of aortic stenosis.   7. The inferior vena cava is dilated in size with >50% respiratory variability, suggesting right atrial pressure of 8 mmHg.     Neuro/Psych negative neurological ROS  negative psych ROS   GI/Hepatic hiatal hernia,GERD   Controlled and Medicated,,  Endo/Other    Renal/GU Renal disease     Musculoskeletal  (+) Arthritis ,    Abdominal   Peds  Hematology   Anesthesia Other Findings   Reproductive/Obstetrics                             Anesthesia Physical Anesthesia Plan  ASA: 4  Anesthesia Plan: General   Post-op Pain Management: Tylenol PO (pre-op)*   Induction: Intravenous  PONV Risk Score and Plan: 3 and Treatment may vary due to age or medical condition, Ondansetron and Dexamethasone  Airway Management Planned: Oral ETT  Additional Equipment: None  Intra-op Plan:   Post-operative Plan: Extubation in OR  Informed Consent: I have reviewed the patients History and Physical, chart, labs and discussed the procedure including the risks, benefits and alternatives for the proposed anesthesia with the patient or authorized representative who has indicated his/her understanding and acceptance.     Dental advisory given  Plan Discussed with: CRNA  Anesthesia Plan Comments: (Risks of anesthesia explained at length. This includes, but is not limited to, sore throat, damage to teeth, lips gums, tongue and vocal cords, nausea and vomiting, reactions to medications, stroke, heart attack, and death. All patient questions were answered and the patient wishes to proceed. )       Anesthesia Quick Evaluation

## 2023-12-07 NOTE — Pre-Procedure Instructions (Signed)
Instructed patient on the following items: Arrival time 1000 Nothing to eat or drink after midnight No meds AM of procedure Responsible person to drive you home and stay with you for 24 hrs  Have you missed any doses of anti-coagulant Eliquis- takes twice a day, hasn't missed any doses.  Don't take dose in the morning.

## 2023-12-08 ENCOUNTER — Ambulatory Visit (HOSPITAL_BASED_OUTPATIENT_CLINIC_OR_DEPARTMENT_OTHER): Payer: Self-pay | Admitting: Anesthesiology

## 2023-12-08 ENCOUNTER — Ambulatory Visit (HOSPITAL_COMMUNITY)
Admission: RE | Disposition: A | Discharge status: Home / Self Care | Payer: Medicare HMO | Source: Home / Self Care | Attending: Cardiology

## 2023-12-08 ENCOUNTER — Ambulatory Visit (HOSPITAL_COMMUNITY): Payer: Self-pay | Admitting: Anesthesiology

## 2023-12-08 ENCOUNTER — Ambulatory Visit (HOSPITAL_COMMUNITY)
Admission: RE | Admit: 2023-12-08 | Discharge: 2023-12-08 | Disposition: A | Discharge status: Home / Self Care | Payer: Medicare HMO | Attending: Cardiology | Admitting: Cardiology

## 2023-12-08 ENCOUNTER — Other Ambulatory Visit: Payer: Self-pay

## 2023-12-08 DIAGNOSIS — I4891 Unspecified atrial fibrillation: Secondary | ICD-10-CM | POA: Diagnosis not present

## 2023-12-08 DIAGNOSIS — Z79899 Other long term (current) drug therapy: Secondary | ICD-10-CM | POA: Diagnosis not present

## 2023-12-08 DIAGNOSIS — J449 Chronic obstructive pulmonary disease, unspecified: Secondary | ICD-10-CM | POA: Diagnosis not present

## 2023-12-08 DIAGNOSIS — I130 Hypertensive heart and chronic kidney disease with heart failure and stage 1 through stage 4 chronic kidney disease, or unspecified chronic kidney disease: Secondary | ICD-10-CM | POA: Insufficient documentation

## 2023-12-08 DIAGNOSIS — E785 Hyperlipidemia, unspecified: Secondary | ICD-10-CM | POA: Insufficient documentation

## 2023-12-08 DIAGNOSIS — E1122 Type 2 diabetes mellitus with diabetic chronic kidney disease: Secondary | ICD-10-CM | POA: Insufficient documentation

## 2023-12-08 DIAGNOSIS — K219 Gastro-esophageal reflux disease without esophagitis: Secondary | ICD-10-CM | POA: Insufficient documentation

## 2023-12-08 DIAGNOSIS — I4819 Other persistent atrial fibrillation: Secondary | ICD-10-CM | POA: Diagnosis not present

## 2023-12-08 DIAGNOSIS — I5032 Chronic diastolic (congestive) heart failure: Secondary | ICD-10-CM | POA: Insufficient documentation

## 2023-12-08 DIAGNOSIS — I129 Hypertensive chronic kidney disease with stage 1 through stage 4 chronic kidney disease, or unspecified chronic kidney disease: Secondary | ICD-10-CM | POA: Diagnosis not present

## 2023-12-08 DIAGNOSIS — N183 Chronic kidney disease, stage 3 unspecified: Secondary | ICD-10-CM | POA: Diagnosis not present

## 2023-12-08 DIAGNOSIS — I4892 Unspecified atrial flutter: Secondary | ICD-10-CM | POA: Diagnosis not present

## 2023-12-08 HISTORY — PX: ATRIAL FIBRILLATION ABLATION: EP1191

## 2023-12-08 LAB — POCT ACTIVATED CLOTTING TIME: Activated Clotting Time: 320 s

## 2023-12-08 SURGERY — ATRIAL FIBRILLATION ABLATION
Anesthesia: General

## 2023-12-08 MED ORDER — FENTANYL CITRATE (PF) 250 MCG/5ML IJ SOLN
INTRAMUSCULAR | Status: DC | PRN
  Administered 2023-12-08: 100 ug via INTRAVENOUS

## 2023-12-08 MED ORDER — ONDANSETRON HCL 4 MG/2ML IJ SOLN
4.0000 mg | Freq: Four times a day (QID) | INTRAMUSCULAR | Status: DC | PRN
Start: 2023-12-08 — End: 2023-12-08

## 2023-12-08 MED ORDER — ACETAMINOPHEN 325 MG PO TABS
650.0000 mg | ORAL_TABLET | ORAL | Status: DC | PRN
Start: 2023-12-08 — End: 2023-12-08

## 2023-12-08 MED ORDER — ACETAMINOPHEN 500 MG PO TABS
1000.0000 mg | ORAL_TABLET | Freq: Once | ORAL | Status: AC
  Administered 2023-12-08: 1000 mg via ORAL
  Filled 2023-12-08: qty 2

## 2023-12-08 MED ORDER — SODIUM CHLORIDE 0.9 % IV SOLN: INTRAVENOUS | Status: DC | PRN

## 2023-12-08 MED ORDER — PHENYLEPHRINE 80 MCG/ML (10ML) SYRINGE FOR IV PUSH (FOR BLOOD PRESSURE SUPPORT)
PREFILLED_SYRINGE | INTRAVENOUS | Status: DC | PRN
  Administered 2023-12-08 (×3): 80 ug via INTRAVENOUS
  Administered 2023-12-08: 160 ug via INTRAVENOUS

## 2023-12-08 MED ORDER — ONDANSETRON HCL 4 MG/2ML IJ SOLN
INTRAMUSCULAR | Status: DC | PRN
  Administered 2023-12-08: 4 mg via INTRAVENOUS

## 2023-12-08 MED ORDER — SODIUM CHLORIDE 0.9% FLUSH: 3.0000 mL | Freq: Two times a day (BID) | INTRAVENOUS | Status: DC

## 2023-12-08 MED ORDER — PROPOFOL 10 MG/ML IV BOLUS
INTRAVENOUS | Status: DC | PRN
  Administered 2023-12-08: 120 mg via INTRAVENOUS

## 2023-12-08 MED ORDER — ROCURONIUM BROMIDE 10 MG/ML (PF) SYRINGE
PREFILLED_SYRINGE | INTRAVENOUS | Status: DC | PRN
  Administered 2023-12-08: 70 mg via INTRAVENOUS

## 2023-12-08 MED ORDER — HEPARIN SODIUM (PORCINE) 1000 UNIT/ML IJ SOLN
INTRAMUSCULAR | Status: DC | PRN
  Administered 2023-12-08: 14000 [IU] via INTRAVENOUS

## 2023-12-08 MED ORDER — ATROPINE SULFATE 1 MG/10ML IJ SOSY
PREFILLED_SYRINGE | INTRAMUSCULAR | Status: AC
  Filled 2023-12-08: qty 10

## 2023-12-08 MED ORDER — HEPARIN (PORCINE) IN NACL 1000-0.9 UT/500ML-% IV SOLN
INTRAVENOUS | Status: DC | PRN
  Administered 2023-12-08 (×3): 500 mL

## 2023-12-08 MED ORDER — PHENYLEPHRINE HCL-NACL 20-0.9 MG/250ML-% IV SOLN
INTRAVENOUS | Status: DC | PRN
  Administered 2023-12-08: 30 ug/min via INTRAVENOUS

## 2023-12-08 MED ORDER — ATROPINE SULFATE 1 MG/ML IV SOLN
INTRAVENOUS | Status: DC | PRN
  Administered 2023-12-08: 1 mg via INTRAVENOUS

## 2023-12-08 MED ORDER — FENTANYL CITRATE (PF) 100 MCG/2ML IJ SOLN
INTRAMUSCULAR | Status: AC
  Filled 2023-12-08: qty 2

## 2023-12-08 MED ORDER — SUGAMMADEX SODIUM 200 MG/2ML IV SOLN
INTRAVENOUS | Status: DC | PRN
  Administered 2023-12-08: 400 mg via INTRAVENOUS

## 2023-12-08 MED ORDER — SODIUM CHLORIDE 0.9 % IV SOLN: INTRAVENOUS | Status: DC

## 2023-12-08 MED ORDER — SODIUM CHLORIDE 0.9 % IV SOLN: 250.0000 mL | INTRAVENOUS | Status: DC | PRN

## 2023-12-08 MED ORDER — HEPARIN SODIUM (PORCINE) 1000 UNIT/ML IJ SOLN
INTRAMUSCULAR | Status: AC
  Filled 2023-12-08: qty 10

## 2023-12-08 MED ORDER — DEXAMETHASONE SODIUM PHOSPHATE 10 MG/ML IJ SOLN
INTRAMUSCULAR | Status: DC | PRN
  Administered 2023-12-08: 4 mg via INTRAVENOUS

## 2023-12-08 MED ORDER — LIDOCAINE 2% (20 MG/ML) 5 ML SYRINGE
INTRAMUSCULAR | Status: DC | PRN
  Administered 2023-12-08: 80 mg via INTRAVENOUS

## 2023-12-08 MED ORDER — SODIUM CHLORIDE 0.9% FLUSH: 3.0000 mL | INTRAVENOUS | Status: DC | PRN

## 2023-12-08 SURGICAL SUPPLY — 21 items
BAG SNAP BAND KOVER 36X36 (MISCELLANEOUS) IMPLANT
CABLE PFA RX CATH CONN (CABLE) IMPLANT
CATH 8FR REPROCESSED SOUNDSTAR (CATHETERS) ×1
CATH 8FR SOUNDSTAR REPROCESSED (CATHETERS) IMPLANT
CATH EZ STEER NAV 8MM D-F CUR (ABLATOR) IMPLANT
CATH FARAWAVE ABLATION 31 (CATHETERS) IMPLANT
CATH OCTARAY 2.0 F 3-3-3-3-3 (CATHETERS) IMPLANT
CATH WEBSTER BI DIR CS D-F CRV (CATHETERS) IMPLANT
CLOSURE PERCLOSE PROSTYLE (VASCULAR PRODUCTS) IMPLANT
COVER SWIFTLINK CONNECTOR (BAG) ×1 IMPLANT
DILATOR VESSEL 38 20CM 16FR (INTRODUCER) IMPLANT
GUIDEWIRE INQWIRE 1.5J.035X260 (WIRE) IMPLANT
INQWIRE 1.5J .035X260CM (WIRE) ×1
KIT VERSACROSS CNCT FARADRIVE (KITS) IMPLANT
PACK EP LF (CUSTOM PROCEDURE TRAY) ×1 IMPLANT
PAD DEFIB RADIO PHYSIO CONN (PAD) ×1 IMPLANT
PATCH CARTO3 (PAD) IMPLANT
SHEATH FARADRIVE STEERABLE (SHEATH) IMPLANT
SHEATH PINNACLE 8F 10CM (SHEATH) IMPLANT
SHEATH PINNACLE 9F 10CM (SHEATH) IMPLANT
SHEATH PROBE COVER 6X72 (BAG) IMPLANT

## 2023-12-08 NOTE — Progress Notes (Signed)
Patient O2 sats noted to dip to 88% on room air. When instructed to take deep breaths patient O2 levels increase to 91%. 2L of O2 placed and O2 now at 95%. Dr. Elberta Fortis aware. Patient alert and oriented with non-labored breathing.

## 2023-12-08 NOTE — H&P (Signed)
  Electrophysiology Office Note:   Date:  12/08/2023  ID:  DEUNDRA HULLUM, DOB 12/08/1941, MRN [id number]  Primary Cardiologist: Olga Millers, MD Electrophysiologist: Regan Lemming, MD      History of Present Illness:   ACELYNN STUCKMAN is a 82 y.o. female with h/o Diastolic heart failure, hypertension, hyperlipidemia, CKD stage III, COPD, atrial fibrillation seen today for  for Electrophysiology evaluation of atrial fibrillation at the request of Jorja Loa.    Today, denies symptoms of palpitations, chest pain, shortness of breath, orthopnea, PND, lower extremity edema, claudication, dizziness, presyncope, syncope, bleeding, or neurologic sequela. The patient is tolerating medications without difficulties. Plan ablation today.   EP Information / Studies Reviewed:    EKG is ordered today. Personal review as below.        Risk Assessment/Calculations:    CHA2DS2-VASc Score = 5   This indicates a 7.2% annual risk of stroke. The patient's score is based upon: CHF History: 1 HTN History: 1 Diabetes History: 0 Stroke History: 0 Vascular Disease History: 0 Age Score: 2 Gender Score: 1             Physical Exam:   VS:  BP 124/79   Pulse 98   Temp (!) 97.5 F (36.4 C) (Oral)   Resp 17   Ht 5\' 2"  (1.575 m)   Wt 81.6 kg   SpO2 96%   BMI 32.92 kg/m    Wt Readings from Last 3 Encounters:  12/08/23 81.6 kg  09/14/23 81.2 kg  09/01/23 81.6 kg    GEN: No acute distress.   Neck: No JVD Cardiac: iRRR, no murmurs, rubs, or gallops.  Respiratory: decreased BS bases bilaterally. GI: Soft, nontender, non-distended  MS: No edema; No deformity. Neuro:  Nonfocal  Skin: warm and dry,  Psych: Normal affect    ASSESSMENT AND PLAN:    1.  Persistent atrial fibrillation: YAMIKA CIFARELLI has presented today for surgery, with the diagnosis of AF.  The various methods of treatment have been discussed with the patient and family. After consideration of risks, benefits  and other options for treatment, the patient has consented to  Procedure(s): Catheter ablation as a surgical intervention .  Risks include but not limited to complete heart block, stroke, esophageal damage, nerve damage, bleeding, vascular damage, tamponade, perforation, MI, and death. The patient's history has been reviewed, patient examined, no change in status, stable for surgery.  I have reviewed the patient's chart and labs.  Questions were answered to the patient's satisfaction.    Kassi Esteve Elberta Fortis, MD 12/08/2023 11:14 AM

## 2023-12-08 NOTE — Transfer of Care (Signed)
Immediate Anesthesia Transfer of Care Note  Patient: Rebekah Harrell  Procedure(s) Performed: ATRIAL FIBRILLATION ABLATION  Patient Location: PACU and Cath Lab  Anesthesia Type:General  Level of Consciousness: awake, oriented, and patient cooperative  Airway & Oxygen Therapy: Patient Spontanous Breathing and Patient connected to nasal cannula oxygen  Post-op Assessment: Report given to RN and Post -op Vital signs reviewed and stable  Post vital signs: Reviewed and stable  Last Vitals:  Vitals Value Taken Time  BP 102/51 12/08/23 1415  Temp 36.6 C 12/08/23 1356  Pulse 61 12/08/23 1418  Resp 17 12/08/23 1418  SpO2 92 % 12/08/23 1418  Vitals shown include unfiled device data.  Last Pain:  Vitals:   12/08/23 1356  TempSrc: Tympanic  PainSc: 0-No pain         Complications: There were no known notable events for this encounter.

## 2023-12-08 NOTE — Discharge Instructions (Signed)
Cardiac Ablation, Care After  This sheet gives you information about how to care for yourself after your procedure. Your health care provider may also give you more specific instructions. If you have problems or questions, contact your health care provider. What can I expect after the procedure? After the procedure, it is common to have: Bruising around your puncture site. Tenderness around your puncture site. Skipped heartbeats. If you had an atrial fibrillation ablation, you may have atrial fibrillation during the first several months after your procedure.  Tiredness (fatigue).  Follow these instructions at home: Puncture site care  Follow instructions from your health care provider about how to take care of your puncture site. Make sure you: Remove square bandages present, this may be removed at 4:00 pm.  Check your puncture site every day for signs of infection. Check for: Redness, swelling, or pain. Fluid or blood. If your puncture site starts to bleed, lie down on your back, apply firm pressure to the area, and contact your health care provider. Warmth. Pus or a bad smell. A pea or small marble sized lump at the site is normal and can take up to three months to resolve.  Driving Do not drive for at least 4 days after your procedure or however long your health care provider recommends. (Do not resume driving if you have previously been instructed not to drive for other health reasons.) Do not drive or use heavy machinery while taking prescription pain medicine. Activity Avoid activities that take a lot of effort for at least 7 days after your procedure. Do not lift anything that is heavier than 5 lb (4.5 kg) for one week.  No sexual activity for 1 week.  Return to your normal activities as told by your health care provider. Ask your health care provider what activities are safe for you. General instructions Take over-the-counter and prescription medicines only as told by your health  care provider. Do not use any products that contain nicotine or tobacco, such as cigarettes and e-cigarettes. If you need help quitting, ask your health care provider. You may shower after removal of bandages, but Do not take baths, swim, or use a hot tub for 1 week.  Do not drink alcohol for 24 hours after your procedure. Keep all follow-up visits as told by your health care provider. This is important. Contact a health care provider if: You have redness, mild swelling, or pain around your puncture site. You have fluid or blood coming from your puncture site that stops after applying firm pressure to the area for 15 minutes.CALL 911 Your puncture site feels warm to the touch. You have pus or a bad smell coming from your puncture site. You have a fever. You have chest pain or discomfort that spreads to your neck, jaw, or arm. You have chest pain that is worse with lying on your back or taking a deep breath. You are sweating a lot. You feel nauseous. You have a fast or irregular heartbeat. You have shortness of breath. You are dizzy or light-headed and feel the need to lie down. You have pain or numbness in the arm or leg closest to your puncture site. Get help right away if: Your puncture site suddenly swells. Lay flat, apply firm pressure for 15 minutes. CALL 911 Your puncture site is bleeding and the bleeding does not stop after applying firm pressure to the area for 15 minutes. These symptoms may represent a serious problem that is an emergency. Do not wait to  see if the symptoms will go away. Get medical help right away. Call your local emergency services (911 in the [place] Do not drive yourself to the hospital. Summary After the procedure, it is normal to have bruising and tenderness at the puncture site in your groin, neck, or forearm. Check your puncture site every day for signs of infection. Get help right away if your puncture site is bleeding and the bleeding does not stop after  applying firm pressure to the area. This is a medical emergency. This information is not intended to replace advice given to you by your health care provider. Make sure you discuss any questions you have with your health care provider.

## 2023-12-08 NOTE — Anesthesia Postprocedure Evaluation (Signed)
Anesthesia Post Note  Patient: Rebekah Harrell  Procedure(s) Performed: ATRIAL FIBRILLATION ABLATION     Patient location during evaluation: PACU Anesthesia Type: General Level of consciousness: sedated and patient cooperative Pain management: pain level controlled Vital Signs Assessment: post-procedure vital signs reviewed and stable Respiratory status: spontaneous breathing Cardiovascular status: stable Anesthetic complications: no   There were no known notable events for this encounter.  Last Vitals:  Vitals:   12/08/23 1615 12/08/23 1630  BP: 123/66 121/63  Pulse: 67 64  Resp: 16 17  Temp:    SpO2: 91% 90%    Last Pain:  Vitals:   12/08/23 1538  TempSrc:   PainSc: 0-No pain                 Lewie Loron

## 2023-12-08 NOTE — Anesthesia Procedure Notes (Signed)
Procedure Name: Intubation Date/Time: 12/08/2023 12:12 PM  Performed by: Sandie Ano, CRNAPre-anesthesia Checklist: Patient identified, Emergency Drugs available, Suction available and Patient being monitored Patient Re-evaluated:Patient Re-evaluated prior to induction Oxygen Delivery Method: Circle System Utilized Preoxygenation: Pre-oxygenation with 100% oxygen Induction Type: IV induction Ventilation: Mask ventilation without difficulty Laryngoscope Size: Mac and 3 Grade View: Grade II Tube type: Oral Tube size: 7.0 mm Number of attempts: 1 Airway Equipment and Method: Stylet and Oral airway Placement Confirmation: ETT inserted through vocal cords under direct vision, positive ETCO2 and breath sounds checked- equal and bilateral Secured at: 22 cm Tube secured with: Tape Dental Injury: Teeth and Oropharynx as per pre-operative assessment

## 2023-12-09 ENCOUNTER — Encounter (HOSPITAL_COMMUNITY): Payer: Self-pay | Admitting: Cardiology

## 2023-12-09 MED FILL — Fentanyl Citrate Preservative Free (PF) Inj 100 MCG/2ML: INTRAMUSCULAR | Qty: 2 | Status: AC

## 2023-12-10 DIAGNOSIS — T464X5A Adverse effect of angiotensin-converting-enzyme inhibitors, initial encounter: Secondary | ICD-10-CM | POA: Diagnosis not present

## 2023-12-10 DIAGNOSIS — I3139 Other pericardial effusion (noninflammatory): Secondary | ICD-10-CM | POA: Diagnosis not present

## 2023-12-10 DIAGNOSIS — Z9889 Other specified postprocedural states: Secondary | ICD-10-CM | POA: Diagnosis not present

## 2023-12-10 DIAGNOSIS — J440 Chronic obstructive pulmonary disease with acute lower respiratory infection: Secondary | ICD-10-CM | POA: Diagnosis not present

## 2023-12-10 DIAGNOSIS — M328 Other forms of systemic lupus erythematosus: Secondary | ICD-10-CM | POA: Diagnosis not present

## 2023-12-10 DIAGNOSIS — J9601 Acute respiratory failure with hypoxia: Secondary | ICD-10-CM | POA: Diagnosis not present

## 2023-12-10 DIAGNOSIS — K449 Diaphragmatic hernia without obstruction or gangrene: Secondary | ICD-10-CM | POA: Diagnosis not present

## 2023-12-10 DIAGNOSIS — I130 Hypertensive heart and chronic kidney disease with heart failure and stage 1 through stage 4 chronic kidney disease, or unspecified chronic kidney disease: Secondary | ICD-10-CM | POA: Diagnosis not present

## 2023-12-10 DIAGNOSIS — E7849 Other hyperlipidemia: Secondary | ICD-10-CM | POA: Diagnosis not present

## 2023-12-10 DIAGNOSIS — R062 Wheezing: Secondary | ICD-10-CM | POA: Diagnosis not present

## 2023-12-10 DIAGNOSIS — R7989 Other specified abnormal findings of blood chemistry: Secondary | ICD-10-CM | POA: Diagnosis not present

## 2023-12-10 DIAGNOSIS — I083 Combined rheumatic disorders of mitral, aortic and tricuspid valves: Secondary | ICD-10-CM | POA: Diagnosis not present

## 2023-12-10 DIAGNOSIS — J810 Acute pulmonary edema: Secondary | ICD-10-CM | POA: Diagnosis not present

## 2023-12-10 DIAGNOSIS — J90 Pleural effusion, not elsewhere classified: Secondary | ICD-10-CM | POA: Diagnosis not present

## 2023-12-10 DIAGNOSIS — J811 Chronic pulmonary edema: Secondary | ICD-10-CM | POA: Diagnosis not present

## 2023-12-10 DIAGNOSIS — I517 Cardiomegaly: Secondary | ICD-10-CM | POA: Diagnosis not present

## 2023-12-10 DIAGNOSIS — I5033 Acute on chronic diastolic (congestive) heart failure: Secondary | ICD-10-CM | POA: Diagnosis not present

## 2023-12-10 DIAGNOSIS — J449 Chronic obstructive pulmonary disease, unspecified: Secondary | ICD-10-CM | POA: Diagnosis not present

## 2023-12-10 DIAGNOSIS — M329 Systemic lupus erythematosus, unspecified: Secondary | ICD-10-CM | POA: Diagnosis not present

## 2023-12-10 DIAGNOSIS — R0902 Hypoxemia: Secondary | ICD-10-CM | POA: Diagnosis not present

## 2023-12-10 DIAGNOSIS — I10 Essential (primary) hypertension: Secondary | ICD-10-CM | POA: Diagnosis not present

## 2023-12-10 DIAGNOSIS — R748 Abnormal levels of other serum enzymes: Secondary | ICD-10-CM | POA: Diagnosis not present

## 2023-12-10 DIAGNOSIS — T783XXA Angioneurotic edema, initial encounter: Secondary | ICD-10-CM | POA: Diagnosis not present

## 2023-12-10 DIAGNOSIS — N1832 Chronic kidney disease, stage 3b: Secondary | ICD-10-CM | POA: Diagnosis not present

## 2023-12-10 DIAGNOSIS — R0600 Dyspnea, unspecified: Secondary | ICD-10-CM | POA: Diagnosis not present

## 2023-12-10 DIAGNOSIS — I2510 Atherosclerotic heart disease of native coronary artery without angina pectoris: Secondary | ICD-10-CM | POA: Diagnosis not present

## 2023-12-10 DIAGNOSIS — R600 Localized edema: Secondary | ICD-10-CM | POA: Diagnosis not present

## 2023-12-10 DIAGNOSIS — I4891 Unspecified atrial fibrillation: Secondary | ICD-10-CM | POA: Diagnosis not present

## 2023-12-10 DIAGNOSIS — R918 Other nonspecific abnormal finding of lung field: Secondary | ICD-10-CM | POA: Diagnosis not present

## 2023-12-10 DIAGNOSIS — J189 Pneumonia, unspecified organism: Secondary | ICD-10-CM | POA: Diagnosis not present

## 2023-12-10 DIAGNOSIS — I700 Atherosclerosis of aorta: Secondary | ICD-10-CM | POA: Diagnosis not present

## 2023-12-22 ENCOUNTER — Inpatient Hospital Stay: Payer: Medicare HMO | Admitting: Physician Assistant

## 2023-12-22 NOTE — Progress Notes (Deleted)
 Cardiology Office Note:    Date:  12/22/2023   ID:  Rebekah Harrell, DOB 01-08-1941, MRN [id number]  PCP:  Alvan Dorothyann BIRCH, MD  Cardiologist:  Redell Shallow, MD Electrophysiologist:  Soyla Gladis Norton, MD { Click to update primary MD,subspecialty MD or APP then REFRESH:1}    Referring MD: Alvan Dorothyann BIRCH, *   Chief Complaint: follow-up of CHF and atrial fibrillation  History of Present Illness:    Rebekah Harrell is a 82 y.o. female with a history of persistent atrial fibrillation on Eliquis , chronic diastolic CHF, mild to moderate MR, hypertension, hyperlipidemia, CKD stage III, COPD, and GERD who is followed by Dr. Shallow and presents today for follow up of CHF and atrial fibrillation.    Patient was initial diagnosed with atrial fibrillation in 05/2016. She was admitted in 11/2022 with atrial fibrillation with RVR and acute diastolic CHF. Echo showed LVEF of 50-55% with normal wall motion and moderate LVH, mildly reduced RV function, and mild MR. She was started on Sotalol  and converted to sinus rhythm. Last Echo in 03/2020 showed LVEF of 55-60% with normal wall motion and grade 2 diastolic dysfunction, normal RV function, moderate left atrial enlargement, and mild to moderate MR. She was last seen by Dr. Shallow in 12/2021 at which time she reported dyspnea on exertion but was otherwise doing well from a cardiac standpoint. Of note, she discontinued Eliquis  on her own due to risk of bleeding and expense.    She was seen by me in 04/2023 and was back in atrial fibrillation at that time. She reported that she felt like she had been back in atrial fibrillation for the past month and noted some chest heaviness. She was restarted on Eliquis  and underwent successful DCCV on 06/10/2023 with restoration of sinus rhythm. Unfortunately, she had early return of atrial fibrillation that seemed to be related to use of her Symbicort  inhaler. She reported that she felt great for 1.5 days  with complete resolution of chest heaviness but then developed significant shortness of breath after not taking her Symbicort  inhaler for several days. She felt like this was due to her COPD so she used her Symbicort  inhaler and felt like this sent her back into atrial fibrillation. She again had associated chest heaviness with this. [facility] was [facility] and repeat DCCV was initially arranged and scheduled for the end of 06/2023. However, this was later cancelled due to concerns about the cost and the feeling that an ablation would be the best treatment options. She was seen by Dr. Norton and ultimately underwent successful ablation on 12/08/2023.   She was then admitted from 12/10/2023 to 12/24/2023 for acute hypoxic respiratory felt to be secondary to bilateral pneumonia, COPD exacerbation, and acute diastolic CHF exacerbation. Pro-BNP was elevated at 3,613 >> 2,038. High-sensitivity troponin was elevated at 494 >> 456.  Echo showed LVEF of 67-70% with normal wall motion and moderate diastolic dysfunction as well as mild AS/ AI and mild MR. Myoview was negative for ischemia. She was treated with antibiotics, steroids, and Lasix . Sotalol  was stopped during admission due to prolonged QTc and she was discharged on Metoprolol  instead. Home Amlodipine  and Hydralazine  were stopped at discharge as well to help avoid hypotension.  Patient presents today for follow-up. ***  Persistent Atrial Fibrillation Initially diagnosed in 2017 and has been on Sotalol . She had done very well on the Sotalol  until recently. She was noted to be back in atrial fibrillation at PCP's  office in early May. She underwent successful DCCV on 06/10/2023 with restoration of sinus rhythm. Unfortunately, she had early return of atrial fibrillation. She underwent an ablation in 11/2023.  - EKG today shows *** - Sotalol  was stopped during recent admission at [facility] due to prolonged QTc.  - Continue Toprol -XL 25mg  twice daily.   - Continue anticoagulation with Eliquis  5mg  twice daily.    Chronic Diastolic CHF She was recently admitted at [facility] in 11/2023 for acute hypoxic respiratory failure secondary to bilateral pneumonia, COPD exacerbation, and acute diastolic CHF exacerbation. Echo at that time showed LVEF of 67-70% with normal wall motion and moderate diastolic dysfunction as well as mild AS/ AI and mild MR. - Euvolemic on exam.  - Continue Lasix  20mg  daily.  - Continue daily weights and sodium/ fluid restrictions. ***    Mild to Moderate Mitral Regurgitation  Mild Aortic Stenosis/ Insufficiency Echo in 05/2023 showed moderate MR; however, most recent Echo in 11/2023 showed only mild MR as well as mild AS/ AI. - Can continue to monitor with routine serial Echos.    Hypertension BP well controlled. *** - Continue Toprol -XL 25mg  twice daily.    Hyperlipidemia Lipid panel in 08/2022: Total Cholesterol 154, Triglycerides 215, HDL 47, LDL 76.  - Continue Crestor  10mg  daily  - Labs followed by PCP.   CKD Stage III Baseline creatinine around 0.9 to 1.2. Stable at 0.96 on recent labs on 12/23/2023.   EKGs/Labs/Other Studies Reviewed:    The following studies were reviewed:  Echocardiogram 06/09/2023: Impressions: 1. Left ventricular ejection fraction, by estimation, is 50 to 55%. The  left ventricle has low normal function. The left ventricle has no regional  wall motion abnormalities. There is mild left ventricular hypertrophy.  Left ventricular diastolic  parameters are indeterminate.   2. Right ventricular systolic function is mildly reduced. The right  ventricular size is mildly enlarged. There is moderately elevated  pulmonary artery systolic pressure. The estimated right ventricular  systolic pressure is 46.9 mmHg.   3. Left atrial size was moderately dilated.   4. Right atrial size was moderately dilated.   5. The mitral valve is degenerative. Moderate mitral valve regurgitation.  Severe mitral  annular calcification.   6. The aortic valve is tricuspid. Aortic valve regurgitation is not  visualized. Aortic valve sclerosis/calcification is present, without any  evidence of aortic stenosis.   7. The inferior vena cava is dilated in size with >50% respiratory  variab  _______________  Echocardiogram 12/11/2023 ([facility]): Impressions: Left Ventricle: Systolic function is high normal to borderline  hyperdynamic. EF: 67- 70%.    Left Ventricle: Wall motion is normal.    Left Ventricle: Doppler parameters consistent with moderate diastolic  dysfunction and elevated LA pressure.    Mitral Valve: There is severe annular calcification.    Mitral Valve: Mitral valve structure is normal. The leaflets are mildly  thickened and exhibit normal excursion.    Mitral Valve: There is mild regurgitation.    Aortic Valve: The aortic valve is tricuspid. The leaflets are mildly  thickened and exhibit normal excursion. The leaflets are mildly calcified.    Aortic Valve: Mild aortic valve regurgitation.    Aortic Valve: There is mild stenosis.  Peak velocity 2.8 m/s.  Peak  gradient 31 mmHg.  Aortic valve area by planimetry 1.8 cm.  Dimensionless  index 0.38.    Left Atrium: Left atrium is moderately dilated at 4.900 cm. Left atrium  volume index is severely increased (>48 mL/m2).  Tricuspid Valve: The right ventricular systolic pressure is mildly  elevated - 44 mmHg.    Tricuspid Valve: There is mild regurgitation. TR jet velocity 3.2  m/sec.  _______________  Pablo 12/22/2023 ([facility]): Impression: Ejection fraction calculates to 51%. No ischemia   EKG:  EKG ordered today. EKG personally reviewed and demonstrates ***.  Recent Labs: 04/30/2023: ALT 10; TSH 2.40 06/05/2023: BNP 331.7 11/23/2023: BUN 21; Creatinine, Ser 1.24; Hemoglobin 14.2; Platelets 279; Potassium 4.1; Sodium 142  Recent Lipid Panel    Component Value Date/Time   CHOL 154 08/27/2022 1218   CHOL 148 11/18/2017 1205    TRIG 215 (H) 08/27/2022 1218   HDL 47 (L) 08/27/2022 1218   HDL 38 (L) 11/18/2017 1205   CHOLHDL 3.3 08/27/2022 1218   VLDL 26 06/19/2016 1136   LDLCALC 76 08/27/2022 1218    Physical Exam:    Vital Signs: There were no vitals taken for this visit.    Wt Readings from Last 3 Encounters:  12/08/23 180 lb (81.6 kg)  09/14/23 179 lb (81.2 kg)  09/01/23 179 lb 12.8 oz (81.6 kg)     General: 82 y.o. female in no acute distress. HEENT: Normocephalic and atraumatic. Sclera clear.  Neck: Supple. No carotid bruits. No JVD. Heart: *** RRR. Distinct S1 and S2. No murmurs, gallops, or rubs.  Lungs: No increased work of breathing. Clear to ausculation bilaterally. No wheezes, rhonchi, or rales.  Abdomen: Soft, non-distended, and non-tender to palpation.  Extremities: No lower extremity edema.  Radial and distal pedal pulses 2+ and equal bilaterally. Skin: Warm and dry. Neuro: No focal deficits. Psych: Normal affect. Responds appropriately.   Assessment:    No diagnosis found.  Plan:     Disposition: Follow up in ***   Signed, Aline FORBES Door, PA-C  12/22/2023 10:38 AM     [facility]

## 2023-12-24 ENCOUNTER — Telehealth: Payer: Self-pay | Admitting: Pulmonary Disease

## 2023-12-24 NOTE — Discharge Summary (Addendum)
 [facility]  [facility] Inpatient Discharge Summary  PCP: Dorothyann JONETTA Byars, MD Discharge Details   Admit date:         12/10/2023 Discharge date:        12/24/2023  Hospital Days:    14 days  Code Status:   Full Code Advanced Directives on file: No Directive        Discharge Diagnoses:  Principal Problem:   Acute respiratory failure with hypoxia (*) Active Problems:   Angioedema due to angiotensin converting enzyme inhibitor (ACE-I)   Systemic lupus erythematosus, unspecified (*)   Atrial fibrillation (*)   CKD (chronic kidney disease) stage 3, GFR 30-59 ml/min (*)   COPD (chronic obstructive pulmonary disease) (*)   Essential hypertension, benign   Hyperlipidemia   Electrical isolation of left atrial appendage after cardiac ablation procedure for atrial fibrillation   Cardiac enzymes elevated   Pneumonia of both lungs due to infectious organism   Task list for follow-up: Follow-up cardiology outpatient clinic.  Re: Event monitor, medication management   Follow-Up Appointments Suggested: Callie E Goodrich, PA-C [address] [place] [address] [place] [address] [phone]     Dorothyann JONETTA Byars, MD [address] [facility] [address] [place] [address] [id number]     [facility] [place], [place] [address] [place]   [address] [phone]    Ambulatory referral to Cardiology     Follow-Up Appointments Already Scheduled: No future appointments.  Discharge Medications: Current Discharge Medication List     DISCONTINUED medications     amLODIPine  besylate (NORVASC ) 10 mg tablet      hydrALAzine  HCl (APRESOLINE ) 50 mg tablet      SOTALOL  80 MG tablet        NEW medications   Details  metoprolol  succinate (TOPROL -XL) 25 mg 24 hr tablet Take one tablet (25 mg dose) by mouth every 12 (twelve) hours. Start date: 12/24/2023       CONTINUED medications    Details  apixaban  (ELIQUIS ) 5 mg tablet Take one tablet (5 mg dose) by mouth 2 (two) times daily.    Calcium  Carb-Cholecalciferol (CALCIUM  600+D3 PO) Take 1 tablet by mouth daily.    cholecalciferol 1,000 units (25 mcg) tablet Take one tablet (1,000 Units dose) by mouth daily.    famotidine  (PEPCID ) 20 MG tablet Take one tablet (20 mg dose) by mouth daily.    furosemide  (LASIX ) 20 mg tablet Take one tablet (20 mg dose) by mouth daily.    hydroxychloroquine  (PLAQUENIL ) 200 MG tablet Take one tablet (200 mg dose) by mouth daily. VISIT NEEDED FOR FURTHER REFILLS    Magnesium  400 MG CAPS     montelukast  (SINGULAIR ) 10 MG tablet Take one tablet (10 mg dose) by mouth at bedtime.    Multiple Vitamins-Minerals (MULTIVITAMIN WOMEN 50+) TABS Take 1 tablet by mouth daily.    omeprazole  (PRILOSEC) 20 MG capsule Take one capsule (20 mg dose) by mouth daily.    potassium chloride  (K-DUR,KLOR-CON ) 20 mEq CR tablet Take one tablet (20 mEq dose) by mouth daily.    rosuvastatin  calcium  (CRESTOR ) 10 mg tablet Take two tablets (20 mg dose) by mouth daily.    tiotropium bromide (SPIRIVA  RESPIMAT) 1.25 mcg/actuation inhaler Inhale two puffs into the lungs daily.    traMADol  (ULTRAM ) 50 mg tablet Take one tablet (50 mg dose) by mouth every 12 (twelve) hours as needed.    zinc 50 mg tablet Take one tablet (50 mg dose)  by mouth daily.        Allergies: Allergies  Allergen Reactions  . Ace Inhibitors Swelling  . Celecoxib Other    REACTION: hair loss  . Sulfa Antibiotics Rash  . Penicillins     Consultations this Admission: CONSULT HEART FAILURE SUPPORT (NAVIGATOR / CHF CLINIC) IP CONSULT TO CASE MANAGEMENT, RN/SW IP CONSULT TO CARDIOLOGY IP CONSULT TO PULMONOLOGY IP CONSULT TO NUTRITION SERVICES IP CONSULT TO CARDIOLOGY  Procedures/Imaging:     NM Heart Spect Ph Stress (R)  Final Result  IMPRESSION: Ejection fraction calculates to 51%. No ischemia    Electronically Signed by: Glendia Guillaume, MD on 12/22/2023 3:15 PM    XR Chest Ap Portable  Final Result  IMPRESSION: Interval improvement of edema and effusions.    Persistent right perihilar and left basilar opacities.    Electronically Signed by: Grayce Linear, MD on 12/14/2023 9:30 AM    CT Chest WO Contrast  Final Result  IMPRESSION:  Worsening bilateral infiltrates.    Small pleural effusions.    Coronary artery calcifications.    There is small hiatal hernia.           Electronically Signed by: Sonny Livings, MD on 12/13/2023 5:29 AM    Echocardiogram Complete WO Enhancing Agent  Final Result  Left Ventricle: Systolic function is high normal to borderline   hyperdynamic. EF: 67- 70%.  .  Left Ventricle: Wall motion is normal.  .  Left Ventricle: Doppler parameters consistent with moderate diastolic   dysfunction and elevated LA pressure.  .  Mitral Valve: There is severe annular calcification.  .  Mitral Valve: Mitral valve structure is normal. The leaflets are mildly   thickened and exhibit normal excursion.  .  Mitral Valve: There is mild regurgitation.  .  Aortic Valve: The aortic valve is tricuspid. The leaflets are mildly   thickened and exhibit normal excursion. The leaflets are mildly calcified.  .  Aortic Valve: Mild aortic valve regurgitation.  .  Aortic Valve: There is mild stenosis.  Peak velocity 2.8 m/s.  Peak   gradient 31 mmHg.  Aortic valve area by planimetry 1.8 cm.  Dimensionless   index 0.38.  SABRA  Left Atrium: Left atrium is moderately dilated at 4.900 cm. Left atrium   volume index is severely increased (>48 mL/m2).  .  Tricuspid Valve: The right ventricular systolic pressure is mildly   elevated - 44 mmHg.  .  Tricuspid Valve: There is mild regurgitation. TR jet velocity 3.2   m/sec.    It was technically difficult study.  Compared to previous study from June 2024 LV systolic function has   improved from previous 50 to 55%, now 67 to 70%.      US  Venous Lower  Extremity Bilateral  Final Result  IMPRESSION:  No evidence of deep venous thrombosis.    Electronically Signed by: Grayce Linear, MD on 12/11/2023 9:45 AM    CT Chest W Contrast  Final Result  IMPRESSION:  1.  No PE or dissection. There is minimal coronary atherosclerosis. Small pericardial effusion. Presumably reactive mediastinal adenopathy.  2.  Bilateral infiltrates, consistent with pneumonia.            Electronically Signed by: Franky Cheshire MD, PHD on 12/10/2023 11:14 AM    XR Chest Ap Portable  Final Result  IMPRESSION:  1.  Interstitial and airspace opacities most prominent within the lower lobes compatible with edema or infectious process. Small bilateral pleural effusions. No pneumothorax.  Electronically Signed by: Valma Companion, MD on 12/10/2023 9:20 AM      Pertinent Labs:  Cardiac Labs: Recent Labs    Units 12/22/23 1731  BNP pg/mL 2,038*   CBC: Recent Labs    Units 12/18/23 0428  WBC thou/mcL 4.7  HGB gm/dL 89.2*  PLT thou/mcL 828   BMP: Recent Labs    Units 12/23/23 0619 12/21/23 0410 12/18/23 0428  NA mmol/L 139 139 137  K mmol/L 4.1 3.8 3.7  CL mmol/L 103 101 100  CO2 mmol/L 28 28 25   BUN mg/dL 25 23 24   CREATININE mg/dL 9.03 9.03 8.97*  MAGNESIUM  mg/dL  --  1.7 1.8   Lipid Panel: No results for input(s): CHOL, TRIG, HDL, LDL in the last 168 hours. Liver Enzymes: No results for input(s): INR, AST, ALT, ALKPHOS, BILITOT in the last 168 hours. Endocrine Panels: Recent Labs    Units 12/23/23 9380 12/21/23 0410 12/18/23 0428  GLUCOSE mg/dL 94 90 99    Hospital Course   Physicians involved in care during this hospitalization Attending Provider: Norleen JONETTA Hopes, MD Attending Provider: Charleen DELENA Shutter, DO Attending Provider: Derryl Duval, MD Attending Provider: Erla Simper, MD Attending Provider: Candido Pebbles, MD Attending Provider: Derryl Duval, MD Attending Provider: Darleene Norleen Railing, MD Attending Provider: Candido Pebbles, MD Attending Provider: Erla Simper, MD Attending Provider: Classie Leek, MD Admitting Provider: Charleen DELENA Shutter, DO Consulting Physician: Youlanda Juba, MD Consulting Physician: Erla Simper, MD Consulting Physician: Cathlean FORBES Lanius, MD Consulting Physician: Molinda KANDICE Packer, MD Consulting Physician: Augusto ONEIDA Crawford Mickey., MD Consulting Physician: Darleene Norleen Railing, MD Consulting Physician: Cathlean FORBES Lanius, MD Consulting Physician: Erla Simper, MD   HPI per admitting provider: 83 y.o. female who presents with shortness of breath. PMH ASTHMA/COPD, HTN/HLD, Afib, DHF, CKD3b.  Patient underwent ablation of A-fib at [facility] 2 days ago, On 12/08/2023.  She follows with pulmonology and has additional history of positive ANA with recurrent angioedema of unclear etiology on Plaquenil  and is followed by [facility] rheumatology.   She presented to the ER today with increasing shortness of breath that started around 9 PM.  Patient reports asymmetric lower extremity edema that started before her ablation.  She reports compliance with both home Lasix  and Eliquis .  She denies chest pain nausea vomiting. Pulse ox was 69% on room air in the ER and did improve with 5 L nasal cannula.  She later desatted again as low as 81% and was put on high flow nasal cannula at 10 L.  She is afebrile heart rates averaging in the 70s to 80s systolic blood pressure in the 120s.  ABG with hypoxemia with PaO2 of 47 on 6 L O2.  Initial troponin 518 with a 1 and 3-hour delta of -24-62 respectively.  CMP fairly unremarkable.  D-dimer trivially elevated at 0.55.  White blood cell 17.  CT chest with contrast negative for PE or dissection but notes small pericardial effusion as well as bilateral infiltrates consistent with pneumonia.  EKG with sinus rhythm and no acute ST or T wave changes.      Hospital Course:    Progress noted by provider of 12/23/2023: Vona Whiters  is a 83 y.o. White or Caucasian [1] female with:   Active Hospital Problems   Acute respiratory failure with hypoxia (*) secondary to acute on chronic diastolic heart failure as well as community-acquired pneumonia: Also has history of COPD. Responded well to antibiotics and diuresis.   Procalcitonin only minimally elevated and  white blood cell count normalized. Oxygenation significantly improved and currently down to 2-3 L nasal cannula.     Cardiac enzymes elevated: Secondary to CHF.  No evidence of ACS.  Initially cardiology had thought about stress test, but now appears to be stabilized.  Nuclear medicine stress test 12/31 unremarkable for any ischemia     Angioedema due to angiotensin converting enzyme inhibitor (ACE-I): Stable medical issue.     Systemic lupus erythematosus, unspecified (*): Continue Plaquenil .  Followed by rheumatology as outpatient     Atrial fibrillation (*): Electrical isolation of left atrial appendage after cardiac ablation procedure for atrial fibrillation Now normal sinus rhythm, continue Eliquis      CKD (chronic kidney disease) stage 3b, GFR 30-59 ml/min (*): Current creatinine better than baseline currently.  Monitoring.     Essential hypertension, benign: Blood pressures have been softer, secondary diuresis.     Hyperlipidemia     Obesity: Meets criteria BMI greater than 30    Further Hospital course: Seen and evaluated bedside.  No new complaints.  We discussed the cardiology recommendations which included but are not limited to discontinuation of sotalol  and continuation of metoprolol  ER 25 mg twice daily.  Cardiologist recommends follow-up in the outpatient clinic in 1 to 2 weeks.   Recommendations: Discussed at bedside Follow-up with cardiology outpatient clinic.  Cardiology recommends event monitor. Discontinue sotalol  per cardiology recommendations.   Take metoprolol  ER 25 mg p.o. twice daily cardiac recommendation.   Discharge diagnoses: Acute  hypoxic respiratory failure Acute on chronic diastolic CHF Community-acquired pneumonia Injury edema due to ACE inhibitor Other chronic medical conditions: Atrial fibrillation, CKD stage IIIb, hypertension, hyperlipidemia, obesity, systemic lupus erythematosus    Cardiology recommendations: PLAN: 1.  Lexiscan nuclear stress test is pending today. 2. Considering QTc prolongation 491 ms after 2 doses of sotalol  and known asthma with acute respiratory failure on this admission and acute heart failure on this admission in 83 years old female, I would discontinue sotalol  and start metoprolol  ER 25 mg p.o. twice a day. I discussed this plan with EP Dr. Lilian who evaluated patient few days ago during rounds earlier during admission and he agreed with above plan.  3.  If Lexiscan nuclear stress test is unremarkable, we will see patient as needed.   4. Follow-up with primary cardiologist in 1 to 2 weeks for further management of atrial fibrillation and diastolic heart failure.  5. I would keep potassium between 4.0-5.0 range and magnesium  2.0-2.5 range. 6. I would avoid hypotension. 7.  I would get outpatient 2-week cardiac monitor.   ADDENDUM:   Lexiscan nuclear stress test today revealed no evidence of ischemia, low normal LV systolic function with EF 51%. Continue current medications. Will see patient as needed.  Please let us  know if you have any questions. Follow-up with her cardiologist in 1 to 2 weeks as above.       BP (!) 112/58 (Patient Position: Lying)   Pulse 62   Temp 97.9 F (36.6 C) (Oral)   Resp 18   Ht 1.575 m (5' 2)   Wt 77.2 kg (170 lb 3.1 oz)   SpO2 96%   BMI 31.13 kg/m   Physical Exam HENT:     Right Ear: External ear normal.     Left Ear: External ear normal.  Cardiovascular:     Rate and Rhythm: Normal rate.     Heart sounds:     No friction rub. No gallop.  Abdominal:     Tenderness:  There is no abdominal tenderness.  Musculoskeletal:     Cervical  back: Normal range of motion.  Neurological:     General: No focal deficit present.     Mental Status: She is alert.  Psychiatric:        Mood and Affect: Mood normal.     Post Hospital Care   Activity:   Weight Bearing Status:          Oxygen Orders for Discharge: O2 Device: Nasal cannula SpO2: 96 % O2 Flow Rate (L/min): 3 L/min A RR: 16 A RR: 16  Diet: Diet and Nourishment Orders (From admission, onward)     Start       12/17/23 1200  Dietary nutrition supplements Ensure Plus High Protein; Vanilla, Strawberry  With Meals       Question Answer Comment  Select Supplement Ensure Plus High Protein   Select Flavor Vanilla   Select Flavor Strawberry        12/10/23 1731  Cardiac diet 2 gm NA  Diet effective now       Question:  Na restriction, if any:  Answer:  2 gm NA              Wound Care Recommendations:    Lines/Drains/Airways: Patient Lines/Drains/Airways Status     Active LDAs     Name Placement date Placement time Site Days   Peripheral IV 22 G Left;Posterior Forearm 12/17/23  0746  Forearm  7            Therapy Recommendations:  PT: Anticipated Intensity of Rehab at Next Level of Care: Multiple times per week Type: HH PT Anticipated Caregiver Needs at Next Level of Care: Intermittent assistance DME Equipment Recommendations: 4-wheeled walker with seat     AM-PAC Basic Mobility Raw Score (out of 24): 20 Routine Mobility Goal: Walk 10 Steps or More & Toilet in Bathroom 6  OT: Anticipated Intensity of Rehab at Next Level of Care: Multiple times per week Type: HH OT Anticipated Caregiver Needs at Next Level of Care: Intermittent assistance   DME Equipment Recommendations: 4-wheeled walker with seat  SLP:              Home Health Orders: DME Orders (From admission, onward)            For Home Use Only DME Gaseous Oxygen  (Arrange for Home Use Only DME Gaseous Oxygen)  Once (Routine)       Question Answer Comment  O2  Delivery device: Nasal Cannula   O2 rate: 3 Lpm   Frequency: Continuous, stationary and portable     Placed in And Linked Group      For Home Use Only DME Gaseous Oxygen  (Arrange for Home Use Only DME Gaseous Oxygen)  Once (Routine)       Comments: LON 99  Question Answer Comment  O2 Delivery device: Nasal Cannula   O2 rate: 3 Lpm   Frequency: Continuous, stationary and portable     Placed in And Linked Group      Arrange for Home Use Only DME Walker / Type: 4-wheeled walker with seat  Once (Routine)       Question Answer Comment  Type: 4-wheeled walker with seat   Equipment justification Patient has mobility limitation that significantly impairs the ability to participate in MRADLs   Equipment justification Patient is able to safely use rolling walker   Equipment justification Patient's mobility deficit can sufficiently be resolved with the use of a  rolling walker   Height 1.575 m (5' 2)   Weight (kg) 74.9                Home Health Agency             Ambulatory Referral to Home Health       Question Answer Comment  Home Health Face to Face Attestation Order Questions: Order questions are part of the face to face attestation   Home Health Disciplines: Skilled Nursing   Home Health Disciplines: Physical Therapy   Reason for Skilled Nursing (enter specifics in comment boxes): Disease/Medication Management   Home health services are needed due to: difficulty with ambulation/transfers due to condition   Home health services are needed due to: alteration in ability to perform self care related to condition   This certifies this patient requires services in the home due to: taxing effort to ambulate   Reason for referral: Acute respiratory failure, CHF, generalized weakness,         Ambulatory Referral to Home Health       Question Answer Comment  Home Health Disciplines: Physical Therapy   This certifies this patient requires services in the home due to: shortness  of breath or dyspnea                 I spent 40 minutes performing discharge services.   Electronically signed: Classie Leek, MD 12/24/2023 / 5:01 PM

## 2023-12-24 NOTE — Telephone Encounter (Signed)
 I called PT to set up FU appt. She explained she is in the hospital and had been for 2 weeks. She was in for a ablation and got pneumonia. They were weaning her off 02 and she said she should be released soon. I asked her to call us  for a HFU when she was released.NFN

## 2023-12-25 ENCOUNTER — Inpatient Hospital Stay: Payer: Medicare HMO | Admitting: Family Medicine

## 2023-12-25 ENCOUNTER — Telehealth: Payer: Self-pay

## 2023-12-25 NOTE — Transitions of Care (Post Inpatient/ED Visit) (Signed)
   12/25/2023  Name: Rebekah Harrell MRN: [id number] DOB: [birth year]  Today's TOC FU Call Status: Today's TOC FU Call Status:: Unsuccessful Call (1st Attempt) Unsuccessful Call (1st Attempt) Date: 12/25/23  Attempted to reach the patient regarding the most recent Inpatient/ED visit. Message left on identified voicemail with return call information.  Follow Up Plan: Additional outreach attempts will be made to reach the patient to complete the Transitions of Care (Post Inpatient/ED visit) call.   Barnie Gowda RN, BSN, CCM RN Care Manager  Transitions of Care  [facility]  [phone]

## 2023-12-28 ENCOUNTER — Telehealth: Payer: Self-pay

## 2023-12-28 ENCOUNTER — Other Ambulatory Visit: Payer: Self-pay | Admitting: Family Medicine

## 2023-12-28 ENCOUNTER — Other Ambulatory Visit: Payer: Self-pay | Admitting: Pulmonary Disease

## 2023-12-28 DIAGNOSIS — K2100 Gastro-esophageal reflux disease with esophagitis, without bleeding: Secondary | ICD-10-CM

## 2023-12-28 MED ORDER — MONTELUKAST SODIUM 10 MG PO TABS: 10.0000 mg | ORAL_TABLET | Freq: Every day | ORAL | 0 refills | Status: DC

## 2023-12-28 NOTE — Transitions of Care (Post Inpatient/ED Visit) (Signed)
   12/28/2023  Name: Rebekah Harrell MRN: [id number] DOB: 01-Dec-1941  Today's TOC FU Call Status: Today's TOC FU Call Status:: Unsuccessful Call (2nd Attempt) Unsuccessful Call (2nd Attempt) Date: 12/28/23  Attempted to reach the patient regarding the most recent Inpatient/ED visit.  Follow Up Plan: Additional outreach attempts will be made to reach the patient to complete the Transitions of Care (Post Inpatient/ED visit) call.   Barnie Gowda RN, BSN, CCM RN Care Manager  Transitions of Care  [facility]  [phone]

## 2023-12-28 NOTE — Telephone Encounter (Signed)
 Copied from CRM [id number]. Topic: Clinical - Medication Refill >> Dec 28, 2023 10:53 AM Delon DASEN wrote: Most Recent Primary Care Visit:  Provider: CURTIS DEBBY PARAS  Department: [facility]  Visit Type: OFFICE VISIT  Date: 11/03/2023  Medication: ***  Has the patient contacted their pharmacy?  (Agent: If no, request that the patient contact the pharmacy for the refill. If patient does not wish to contact the pharmacy document the reason why and proceed with request.) (Agent: If yes, when and what did the pharmacy advise?)  Is this the correct pharmacy for this prescription?  If no, delete pharmacy and type the correct one.  This is the patient's preferred pharmacy:  [id number] - [place], [place] - [address] AT [facility] OF [address] [place] [address] Phone: [phone] Fax: [phone]  [facility] - [place], [place] - [address] [place] [address] Phone: [phone] Fax: [phone]   Has the prescription been filled recently?   Is the patient out of the medication?   Has the patient been seen for an appointment in the last year OR does the patient have an upcoming appointment?   Can we respond through MyChart?   Agent: Please be advised that Rx refills may take up to 3 business days. We ask that you follow-up with your pharmacy.

## 2023-12-30 ENCOUNTER — Telehealth: Payer: Self-pay

## 2023-12-30 NOTE — Telephone Encounter (Signed)
 Pt informed of providers result & recommendations. Pt verbalized understanding. All questions, if any, were answered. Pt states that she is feeling better and better each day since her hospitalization she has had to use a walker and supplemental O2@ 2-3#. She is taking lasix  20mg  daily. She is weighing daily currently @hm  163.8# she will continue to take daily and call as directed. She states that sh is continuing to take numbness and edema on her right leg, she states that she cannot bend her toes she she does not understand why they cannot find a reason for this-the edema and numbness. Appt scheduled as well

## 2023-12-30 NOTE — Telephone Encounter (Signed)
 Copied from CRM [id number]. Topic: Clinical - Prescription Issue >> Dec 29, 2023  1:39 PM Curlee DEL wrote: Reason for CRM: Patient was recently discharged from the hospital and they did a medication reconciliation with a pharmacist. A recommendation was made for an inhaler and they faxed over some paperwork to the office for the provider's review - If the paperwork needs to be re faxed or they need to be reached - they can call [facility] at [phone] ext [phone].

## 2023-12-30 NOTE — Telephone Encounter (Signed)
 I'm sorry she got sick with pneumonia. I would recommend she re-schedule her appointment in the next week or two to make sure she is doing okay after recent hospitalization (I don't think I am back in the clinic for a few weeks so it can be with another APP or Dr. Pietro). She should also follow-up with her PCP. It looks like she was discharged on Lasix  20mg  daily. Is she taking this? She should continue to monitor her weight at home and let us  know if she gains 3 lbs in 1 day or 5 lbs in 1 week. She should also let us  know if she has any worsening edema.  Thank you!

## 2023-12-31 ENCOUNTER — Ambulatory Visit: Payer: Medicare HMO | Admitting: Student

## 2023-12-31 NOTE — Telephone Encounter (Signed)
 I know she has had the edema for a while, but how long as she had the numbness. Was she having that during her hospitalization? They got lower extremity venous dopplers during her admission at [facility] and they were negative for DVT which is good. We can discuss more at follow-up visit.  Thank you!

## 2023-12-31 NOTE — Telephone Encounter (Signed)
 Called pt and she states that the numbness started about 2 months ago. Stopped while in the hospital. And the numbness started again when she returned home. She states that we have done multiple tests to try to figure out why she is numb. She states that she thinks it has something to do with dehydration. She does not like to drink so she thinks she is dehydrated.

## 2024-01-02 NOTE — Telephone Encounter (Signed)
 Ok thank you. We can just discuss further at her follow-up visit later this month.   Thank you!

## 2024-01-05 ENCOUNTER — Ambulatory Visit (HOSPITAL_COMMUNITY): Payer: Medicare HMO | Admitting: Internal Medicine

## 2024-01-06 ENCOUNTER — Ambulatory Visit (INDEPENDENT_AMBULATORY_CARE_PROVIDER_SITE_OTHER): Payer: Medicare HMO | Admitting: Family Medicine

## 2024-01-06 VITALS — BP 123/55 | HR 77 | Ht 63.0 in | Wt 162.0 lb

## 2024-01-06 DIAGNOSIS — I10 Essential (primary) hypertension: Secondary | ICD-10-CM | POA: Diagnosis not present

## 2024-01-06 DIAGNOSIS — R2241 Localized swelling, mass and lump, right lower limb: Secondary | ICD-10-CM

## 2024-01-06 DIAGNOSIS — I480 Paroxysmal atrial fibrillation: Secondary | ICD-10-CM

## 2024-01-06 DIAGNOSIS — J449 Chronic obstructive pulmonary disease, unspecified: Secondary | ICD-10-CM

## 2024-01-06 DIAGNOSIS — N1831 Chronic kidney disease, stage 3a: Secondary | ICD-10-CM

## 2024-01-06 MED ORDER — METOPROLOL SUCCINATE ER 25 MG PO TB24: 25.0000 mg | ORAL_TABLET | Freq: Two times a day (BID) | ORAL | 0 refills | Status: DC

## 2024-01-06 NOTE — Assessment & Plan Note (Signed)
 COPD with bilat PNA.  We just discussed that it is probably going to take another month for her to really recover.  Just encouraged her to start moving and getting a little bit more active gradually.  She is on 3 L and she has been wearing it on and off depending on how she feels.  So we did do a 6-minute walk test today she did have to take a break around 4 minutes because her legs and back were hurting.  But the lowest level it dropped to was 90% off of oxygen on room air.

## 2024-01-06 NOTE — Assessment & Plan Note (Signed)
 Pressures look great.  Will refill metoprolol  until she gets back in with cardiology that way they need to make some adjustments at that time they can but it does look great and home blood pressures look wonderful as well.

## 2024-01-06 NOTE — Telephone Encounter (Signed)
 I have not seen this and pt did not mention this while at her appt.

## 2024-01-06 NOTE — Progress Notes (Signed)
Established Patient Office Visit  Subjective  Patient ID: Rebekah Harrell, female    DOB: 1941/01/26  Age: 83 y.o. MRN: [id number]  Chief Complaint  Patient presents with   Hospitalization Follow-up    Pt was admitted to [facility] for Hypoxia.     HPI  Rebekah Harrell an 83 year old female who is here today for hospital follow-up.  She was admitted to the hospital on December 19 for acute respiratory failure after developing hospital-acquired pneumonia after procedure for atrial fibrillation.  She was discharged home 14 days later on January 2.  Sotalol and hydroxyzine were both discontinued and she was switched to metoprolol.  She was told to hold the metoprolol if her systolic blood pressure was less than 110 so she has had to skip it a couple times but she is almost out of the medication and her follow-up with cardiology is not till the end of the month.  Did bring in her home blood pressure log.  Just feels really tired.  And still getting short of breath with activities.  He was discharged home from the hospital on 3 L.  Ablation was successful which has been fantastic.  Home blood pressure log mostly in the 120s.  The lowest was 107 and the highest was 130.  Pulse predominantly in the 90s.  She lost a significant amount of weight while hospitalized she went and waning around 180 and was discharged home at 159.    ROS    Objective:     BP (!) 123/55   Pulse 77   Ht 5\' 3"  (1.6 m)   Wt 162 lb (73.5 kg)   SpO2 100% Comment: 3L  BMI 28.70 kg/m    Physical Exam Vitals and nursing note reviewed.  Constitutional:      Appearance: Normal appearance.  HENT:     Head: Normocephalic and atraumatic.  Eyes:     Conjunctiva/sclera: Conjunctivae normal.  Cardiovascular:     Rate and Rhythm: Normal rate and regular rhythm.  Pulmonary:     Effort: Pulmonary effort is normal.     Breath sounds: Normal breath sounds. No wheezing.     Comments: Coarse BS bilat Skin:    General: Skin is  warm and dry.  Neurological:     Mental Status: She is alert.  Psychiatric:        Mood and Affect: Mood normal.      No results found for any visits on 01/06/24.    The ASCVD Risk score (Arnett DK, et al., 2019) failed to calculate for the following reasons:   The 2019 ASCVD risk score is only valid for ages 97 to 62    Assessment & Plan:   Problem List Items Addressed This Visit       Cardiovascular and Mediastinum   HYPERTENSION, BENIGN ESSENTIAL - Primary   Pressures look great.  Will refill metoprolol until she gets back in with cardiology that way they need to make some adjustments at that time they can but it does look great and home blood pressures look wonderful as well.      Relevant Medications   metoprolol succinate (TOPROL-XL) 25 MG 24 hr tablet   Atrial fibrillation (HCC)   So far she has done well after her cardioversion and has remained in sinus rhythm.  She did need to refill the metoprolol until she can get back in with cardiology.      Relevant Medications   metoprolol succinate (TOPROL-XL) 25 MG 24 hr tablet  Respiratory   COPD (chronic obstructive pulmonary disease) (HCC)   COPD with bilat PNA.  We just discussed that it is probably going to take another month for her to really recover.  Just encouraged her to start moving and getting a little bit more active gradually.  She is on 3 L and she has been wearing it on and off depending on how she feels.  So we did do a 6-minute walk test today she did have to take a break around 4 minutes because her legs and back were hurting.  But the lowest level it dropped to was 90% off of oxygen on room air.        Genitourinary   CKD (chronic kidney disease) stage 3, GFR 30-59 ml/min (HCC)   Continue to follow renal function every 6 months.        Other   Localized swelling of right lower extremity   She says when she gets volume overloaded she always tends to swell just in her right foot and leg it does  not really affect her left.  Right now it has been pretty well-controlled but she is keeping an eye on it.       Return in about 6 weeks (around 02/17/2024) for recheck lungs.   I spent 45 minutes on the day of the encounter to include pre-visit record review, face-to-face time with the patient and post visit ordering of test.  Nani Gasser, MD

## 2024-01-08 ENCOUNTER — Telehealth: Payer: Self-pay | Admitting: Pulmonary Disease

## 2024-01-08 NOTE — Telephone Encounter (Signed)
Raynelle Fanning states patient needs RX for rescue inhaler. Pharmacy is [facility] [place]. Please call the patient at [phone]. Raynelle Fanning phone number is [phone].

## 2024-01-08 NOTE — Assessment & Plan Note (Signed)
So far she has done well after her cardioversion and has remained in sinus rhythm.  She did need to refill the metoprolol until she can get back in with cardiology.

## 2024-01-08 NOTE — Assessment & Plan Note (Signed)
Continue to follow renal function every 6 months. 

## 2024-01-08 NOTE — Assessment & Plan Note (Signed)
She says when she gets volume overloaded she always tends to swell just in her right foot and leg it does not really affect her left.  Right now it has been pretty well-controlled but she is keeping an eye on it.

## 2024-01-10 NOTE — Progress Notes (Signed)
Cardiology Office Note:    Date:  01/21/2024   ID:  Rebekah Harrell, DOB 1941-08-27, MRN [id number]  PCP:  Agapito Games, MD  Cardiologist:  Olga Millers, MD Electrophysiologist:  Regan Lemming, MD     Referring MD: Agapito Games, *   Chief Complaint: follow-up of CHF and atrial fibrillation  History of Present Illness:    Rebekah Harrell is a 83 y.o. female with a history of persistent atrial fibrillation on Eliquis, chronic diastolic CHF, mild to moderate MR, hypertension, hyperlipidemia, CKD stage III, COPD, and GERD who is followed by Dr. Jens Som and presents today for follow up of CHF and atrial fibrillation.    Patient was initial diagnosed with atrial fibrillation in 05/2016. She was admitted in 11/2022 with atrial fibrillation with RVR and acute diastolic CHF. Echo showed LVEF of 50-55% with normal wall motion and moderate LVH, mildly reduced RV function, and mild MR. She was started on Sotalol and converted to sinus rhythm. Last Echo in 03/2020 showed LVEF of 55-60% with normal wall motion and grade 2 diastolic dysfunction, normal RV function, moderate left atrial enlargement, and mild to moderate MR. She was last seen by Dr. Jens Som in 12/2021 at which time she reported dyspnea on exertion but was otherwise doing well from a cardiac standpoint. Of note, she discontinued Eliquis on her own due to risk of bleeding and expense.    She was seen by me in 04/2023 and was back in atrial fibrillation at that time. She reported that she felt like she had been back in atrial fibrillation for the past month and noted some chest heaviness. She was restarted on Eliquis and underwent successful DCCV on 06/10/2023 with restoration of sinus rhythm. Unfortunately, she had early return of atrial fibrillation that seemed to be related to use of her Symbicort inhaler. She reported that she felt great for 1.5 days with complete resolution of chest heaviness but then developed  significant shortness of breath after not taking her Symbicort inhaler for several days. She felt like this was due to her COPD so she used her Symbicort inhaler and felt like this sent her back into atrial fibrillation. She again had associated chest heaviness with this. [facility] was [facility] and repeat DCCV was initially arranged and scheduled for the end of 06/2023. However, this was later cancelled due to concerns about the cost and the feeling that an ablation would be the best treatment options. She was seen by Dr. Elberta Fortis and ultimately underwent successful ablation on 12/08/2023.   She was then admitted from 12/10/2023 to 12/24/2023 for acute hypoxic respiratory felt to be secondary to bilateral pneumonia, COPD exacerbation, and acute diastolic CHF exacerbation. Pro-BNP was elevated at 3,613 >> 2,038. High-sensitivity troponin was elevated at 494 >> 456.  Echo showed LVEF of 67-70% with normal wall motion and moderate diastolic dysfunction as well as mild AS/ AI and mild MR. Myoview was negative for ischemia. She was treated with antibiotics, steroids, and Lasix. Sotalol was stopped during admission due to prolonged QTc and she was discharged on Metoprolol instead. Home Amlodipine and Hydralazine were stopped at discharge as well to help avoid hypotension.  Patient presents today for follow-up. Here alone. She reports significant dyspnea on exertion, especially when walking up stairs, and orthopnea. No real PND. She is having to wear 2L of O2 at night. She also reports lower extremity edema since leaving the hospital. Dopplers in the hospital were negative for  DVT.  However, she denies any weight gain and states she actually lost about 20 lbs during her prolonged hospitalization. She is still having some occasional chest heaviness that occurs mainly at rest and will last for a couple of minutes at a time and then resolve. However, she states this is much better than before and occurring much  less frequently. She denies any palpitations. No lightheadedness/ dizziness or syncope.   She was seen by Pulmonology earlier this week on 01/18/2024. Chest x-ray showed findings consistent with COPD as well as blunting of bilateral costophrenic angles favoring bilateral trace pleural effusion vs pleural thickening. Mild superimposed pulmonary edema could not be included. She was instructed to increase her Lasix to 20mg  twice daily for 2 days, which she did, but she did not notice much a difference in urinary output or symptoms.   Patient brings in a log of her BP/HR. Overall BP and HR have been well controlled. BP has ranged between 106-138/50-75. HR has ranged from 61-77 bpm.   EKGs/Labs/Other Studies Reviewed:    The following studies were reviewed:  Echocardiogram 06/09/2023: Impressions: 1. Left ventricular ejection fraction, by estimation, is 50 to 55%. The  left ventricle has low normal function. The left ventricle has no regional  wall motion abnormalities. There is mild left ventricular hypertrophy.  Left ventricular diastolic  parameters are indeterminate.   2. Right ventricular systolic function is mildly reduced. The right  ventricular size is mildly enlarged. There is moderately elevated  pulmonary artery systolic pressure. The estimated right ventricular  systolic pressure is 46.9 mmHg.   3. Left atrial size was moderately dilated.   4. Right atrial size was moderately dilated.   5. The mitral valve is degenerative. Moderate mitral valve regurgitation.  Severe mitral annular calcification.   6. The aortic valve is tricuspid. Aortic valve regurgitation is not  visualized. Aortic valve sclerosis/calcification is present, without any  evidence of aortic stenosis.   7. The inferior vena cava is dilated in size with >50% respiratory  variab  _______________   Echocardiogram 12/11/2023 ([facility]): Impressions: Left Ventricle: Systolic function is high normal to borderline   hyperdynamic. EF: 67- 70%.    Left Ventricle: Wall motion is normal.    Left Ventricle: Doppler parameters consistent with moderate diastolic  dysfunction and elevated LA pressure.    Mitral Valve: There is severe annular calcification.    Mitral Valve: Mitral valve structure is normal. The leaflets are mildly  thickened and exhibit normal excursion.    Mitral Valve: There is mild regurgitation.    Aortic Valve: The aortic valve is tricuspid. The leaflets are mildly  thickened and exhibit normal excursion. The leaflets are mildly calcified.    Aortic Valve: Mild aortic valve regurgitation.    Aortic Valve: There is mild stenosis.  Peak velocity 2.8 m/s.  Peak  gradient 31 mmHg.  Aortic valve area by planimetry 1.8 cm.  Dimensionless  index 0.38.    Left Atrium: Left atrium is moderately dilated at 4.900 cm. Left atrium  volume index is severely increased (>48 mL/m2).    Tricuspid Valve: The right ventricular systolic pressure is mildly  elevated - 44 mmHg.    Tricuspid Valve: There is mild regurgitation. TR jet velocity 3.2  m/sec.  _______________   Celine Ahr 12/22/2023 ([facility]): Impression: Ejection fraction calculates to 51%. No ischemia    EKG:  EKG ordered today.   EKG Interpretation Date/Time:  Thursday January 21 2024 14:33:56 EST Ventricular Rate:  73 PR Interval:  80 QRS Duration:  82 QT Interval:  412 QTC Calculation: 453 R Axis:   75  Text Interpretation: Sinus rhythm with short PR Abnormal T wave in inferior leads Slight ST depression in leads V3-V5 Confirmed by Marjie Skiff [phone]) on 01/21/2024 2:54:33 PM    Recent Labs: 04/30/2023: ALT 10; TSH 2.40 06/05/2023: BNP 331.7 11/23/2023: BUN 21; Creatinine, Ser 1.24; Hemoglobin 14.2; Platelets 279; Potassium 4.1; Sodium 142  Recent Lipid Panel    Component Value Date/Time   CHOL 154 08/27/2022 1218   CHOL 148 11/18/2017 1205   TRIG 215 (H) 08/27/2022 1218   HDL 47 (L) 08/27/2022 1218   HDL 38 (L)  11/18/2017 1205   CHOLHDL 3.3 08/27/2022 1218   VLDL 26 06/19/2016 1136   LDLCALC 76 08/27/2022 1218    Physical Exam:    Vital Signs: BP 122/70 (BP Location: Left Arm, Cuff Size: Normal)   Pulse 73   Ht 5\' 3"  (1.6 m)   Wt 160 lb 12.8 oz (72.9 kg)   SpO2 91%   BMI 28.48 kg/m     Wt Readings from Last 3 Encounters:  01/21/24 160 lb 12.8 oz (72.9 kg)  01/18/24 163 lb 3.2 oz (74 kg)  01/06/24 162 lb (73.5 kg)     General: 83 y.o. Caucasian female in no acute distress. HEENT: Normocephalic and atraumatic. Sclera clear.  Neck: Supple. No JVD. Heart: RRR. Soft II/VI systolic murmur.  Lungs: No increased work of breathing. Clear to ausculation bilaterally. No wheezes, rhonchi, or rales.  Abdomen: Soft, non-distended, and non-tender to palpation.  Extremities: Trace lower extremity edema bilaterally.  Skin: Warm and dry. Neuro: No focal deficits. Psych: Normal affect. Responds appropriately.   Assessment:    1. Acute on chronic diastolic CHF (congestive heart failure) (HCC)   2. Persistent atrial fibrillation (HCC)   3. Atypical chest pain   4. Mitral valve insufficiency, unspecified etiology   5. Nonrheumatic aortic valve stenosis   6. Nonrheumatic aortic valve insufficiency   7. Essential hypertension, benign   8. Hyperlipidemia, unspecified hyperlipidemia type   9. Stage 3 chronic kidney disease, unspecified whether stage 3a or 3b CKD (HCC)     Plan:    Acute on Chronic Diastolic CHF She was recently admitted at [facility] in 11/2023 for acute hypoxic respiratory failure secondary to bilateral pneumonia, COPD exacerbation, and acute diastolic CHF exacerbation. Echo at that time showed LVEF of 67-70% with normal wall motion and moderate diastolic dysfunction as well as mild AS/ AI and mild MR. - She describes significant dyspnea on exertion as well as orthopnea. She is also now requiring O2 at night. She does not appear significantly volume overloaded on exam. However, chest  x-ray earlier this week was suggestive of mild volume overload.  - Will check BNP and BMET today. - Currently on Lasix 20mg  daily. Will likely increase Lasix to 40mg  daily and will likely add Spironolactone or SGLT2 inhibitor but will wait for BMET results from today before doing this.  - Continue daily weights and sodium/ fluid restrictions.  - Will arrange close follow-up in about 2 weeks.   Persistent Atrial Fibrillation Initially diagnosed in 2017 and has been on Sotalol. She had done very well on the Sotalol until recently. She was noted to be back in atrial fibrillation at PCP's office in early May. She underwent successful DCCV in 05/2023 with restoration of sinus rhythm. Unfortunately, she had early return of atrial fibrillation. She underwent an ablation in 11/2023.  - EKG today  shows normal sinus rhythm with rate 73 bpm. QTc 453 ms. - Sotalol was stopped during recent admission at [facility] due to prolonged QTc. Will remain off of this.  - Continue Toprol-XL 25mg  twice daily.  - Continue anticoagulation with Eliquis 5mg  twice daily.   Atypical Chest Pain Patient describes some atypical chest heaviness primarily at rest. However, she states this is much improved from before and is occurring less frequently. Myoview during recent admission in 11/2023 was negative.  - Questions whether some of this is due to volume overload. Continue management of CHF as above. - No additional ischemic work-up necessary at this time.     Mild to Moderate Mitral Regurgitation  Mild Aortic Stenosis/ Insufficiency Echo in 05/2023 showed moderate MR; however, most recent Echo in 11/2023 showed only mild MR as well as mild AS/ AI. - Can continue to monitor with routine serial Echos.    Hypertension BP well controlled.  - Continue Toprol-XL 25mg  twice daily.    Hyperlipidemia Lipid panel in 08/2022: Total Cholesterol 154, Triglycerides 215, HDL 47, LDL 76.  - Continue Crestor 10mg  daily  - Labs followed by  PCP.   CKD Stage III Baseline creatinine around 0.9 to 1.2. Stable at 0.96 on recent labs on 12/23/2023.   Disposition: Follow up in 2 weeks.    Signed, Corrin Parker, PA-C  01/21/2024 10:34 PM    [facility]

## 2024-01-15 ENCOUNTER — Other Ambulatory Visit: Payer: Self-pay | Admitting: Cardiology

## 2024-01-15 DIAGNOSIS — I4819 Other persistent atrial fibrillation: Secondary | ICD-10-CM

## 2024-01-15 NOTE — Telephone Encounter (Signed)
Prescription refill request for Eliquis received. Indication: Afib  Last office visit: 09/01/23 Irene Limbo)  Scr: 0.96 (12/23/23)  Age: 83 Weight: 73.5kg  Appropriate dose. Refill sent.

## 2024-01-18 ENCOUNTER — Ambulatory Visit (HOSPITAL_BASED_OUTPATIENT_CLINIC_OR_DEPARTMENT_OTHER): Payer: Medicare HMO | Admitting: Pulmonary Disease

## 2024-01-18 ENCOUNTER — Ambulatory Visit (HOSPITAL_BASED_OUTPATIENT_CLINIC_OR_DEPARTMENT_OTHER): Payer: Medicare HMO

## 2024-01-18 ENCOUNTER — Encounter (HOSPITAL_BASED_OUTPATIENT_CLINIC_OR_DEPARTMENT_OTHER): Payer: Self-pay | Admitting: Pulmonary Disease

## 2024-01-18 ENCOUNTER — Other Ambulatory Visit (HOSPITAL_COMMUNITY): Payer: Self-pay

## 2024-01-18 VITALS — BP 108/50 | HR 86 | Resp 20 | Ht 63.0 in | Wt 163.2 lb

## 2024-01-18 DIAGNOSIS — R0602 Shortness of breath: Secondary | ICD-10-CM

## 2024-01-18 DIAGNOSIS — J4489 Other specified chronic obstructive pulmonary disease: Secondary | ICD-10-CM | POA: Diagnosis not present

## 2024-01-18 DIAGNOSIS — J449 Chronic obstructive pulmonary disease, unspecified: Secondary | ICD-10-CM | POA: Diagnosis not present

## 2024-01-18 DIAGNOSIS — R918 Other nonspecific abnormal finding of lung field: Secondary | ICD-10-CM | POA: Diagnosis not present

## 2024-01-18 DIAGNOSIS — I5043 Acute on chronic combined systolic (congestive) and diastolic (congestive) heart failure: Secondary | ICD-10-CM

## 2024-01-18 DIAGNOSIS — R0609 Other forms of dyspnea: Secondary | ICD-10-CM

## 2024-01-18 NOTE — Telephone Encounter (Signed)
Albuterol Is not listed on patient medication list Patient seen in clinic today by JE.Marland Kitchen

## 2024-01-18 NOTE — Patient Instructions (Signed)
CXR today. Will call about results Ambulatory O2 to 89% Recommend wearing 2-3L O2 for goal SpO2 >90% Continue oxygen 3L at night Please take extra lasix 20 mg x 2 days Follow-up with Cardiology on Thursday

## 2024-01-18 NOTE — Progress Notes (Signed)
Subjective:   PATIENT ID: Rebekah Harrell GENDER: female DOB: Nov 05, 1941, MRN: [id number]  Chief Complaint  Patient presents with   Hospitalization Follow-up    12/19 Pneumonia- out of breathe by the time we got to room-feels like minor improvement since hospital.     Reason for Visit: Hospital follow-up  Mr. Rebekah Harrell is a 83 year old female with COPD, HTN, atrial fibrillation s/p CV, HTN, CKD IIIa, SLE who presents for hospital follow-up.  She was recently seen in follow-up with her PCP on 01/05/23 for but today her resting O2 90%. She was hospitalized from 12/19-12/24/23 for recent ablation on 12/17 and found with AHRF secondary to pneumonia on 12/19 requiring ICU admission. Patient reported diuresis of 21 lbs Hospital discharge commented on CXR with persistent right perihilar and left basilar opacities and CT Chest with worsening bilateral infiltrates, small pleural effusions. She reports she has to sit up to sleep and wearing oxygen at night with sudden awakenings. Denies fevers, chills. Walking is limited to walking to mailbox. Currently taking lasix 20 mg daily   Last seen by Dr. Judeth Horn on 09/14/23 for routine follow-up for dyspnea on exertion thought related to primarily her cardiac disease. On Arnuity and Spiriva and avoiding LABA due to atrial fibrillation.  I have personally reviewed patient's past medical/family/social history, allergies, current medications.  Past Medical History:  Diagnosis Date   Allergy    Aortic atherosclerosis (HCC)    Arthritis    "lower back; knees" (11/26/2016)   Asthma    Chronic atrial fibrillation (HCC)    CKD (chronic kidney disease), stage III (HCC)    patient unaware of this on 11/26/2016   Complication of anesthesia    "had trouble waking me up in the late 1990's after gallbladder OR"   COPD (chronic obstructive pulmonary disease) (HCC)    GERD (gastroesophageal reflux disease)    Heart murmur    "dx'd when I was a little  kid"   History of hiatal hernia    Hyperlipidemia    Hypertension    Hypoglycemia      Family History  Problem Relation Age of Onset   CAD Mother        HEART ATTACK   Colon cancer Father        KIDNEY FAILURE/COLON CANCER   Atrial fibrillation Brother    Healthy Brother    Heart failure Maternal Grandmother    Kidney failure Maternal Grandfather    Emphysema Maternal Grandfather    Heart attack Paternal Grandmother      Social History   Occupational History   Occupation: stages houses for sale    Comment: self employed   Occupation: Retired.  Tobacco Use   Smoking status: Never    Passive exposure: Past   Smokeless tobacco: Never  Vaping Use   Vaping status: Never Used  Substance and Sexual Activity   Alcohol use: Not Currently   Drug use: No   Sexual activity: Not Currently    Allergies  Allergen Reactions   Ace Inhibitors Swelling   Atorvastatin Other (See Comments)    Myalgias   Simvastatin     Myalgia    Yellow Dyes (Non-Tartrazine) Other (See Comments)    Allergy test   Celecoxib Other (See Comments)    hair loss   Cetirizine Hcl Other (See Comments)    fatigue   Penicillins Other (See Comments)    Unknown childhood reaction    Sulfonamide Derivatives Rash  Outpatient Medications Prior to Visit  Medication Sig Dispense Refill   apixaban (ELIQUIS) 5 MG TABS tablet TAKE 1 TABLET TWICE DAILY 180 tablet 1   CALCIUM PO Take 1 tablet by mouth at bedtime.     Cholecalciferol 25 MCG (1000 UT) tablet Take 1,000 Units by mouth daily.     famotidine (PEPCID) 20 MG tablet TAKE 1 TABLET AT BEDTIME 90 tablet 3   Fluticasone Furoate (ARNUITY ELLIPTA) 100 MCG/ACT AEPB Inhale 1 puff into the lungs daily. 30 each 11   furosemide (LASIX) 20 MG tablet Take 1 tablet (20 mg total) by mouth daily. 90 tablet 3   hydroxychloroquine (PLAQUENIL) 200 MG tablet TAKE 1 TABLET EVERY DAY 90 tablet 3   Magnesium 200 MG TABS Take 1 tablet (200 mg total) by mouth daily. 60  each    metoprolol succinate (TOPROL-XL) 25 MG 24 hr tablet Take 1 tablet (25 mg total) by mouth every 12 (twelve) hours. 60 tablet 0   montelukast (SINGULAIR) 10 MG tablet Take 1 tablet (10 mg total) by mouth at bedtime. 90 tablet 0   Multiple Vitamins-Minerals (MULTIVITAMIN PO) Take 1 tablet by mouth at bedtime.     omeprazole (PRILOSEC) 20 MG capsule TAKE 1 CAPSULE EVERY DAY 90 capsule 3   potassium chloride SA (KLOR-CON M) 20 MEQ tablet TAKE 1 TABLET EVERY DAY 90 tablet 3   rosuvastatin (CRESTOR) 20 MG tablet Take 1 tablet (20 mg total) by mouth daily. 90 tablet 3   SPIRIVA RESPIMAT 1.25 MCG/ACT AERS INHALE 2 PUFFS INTO THE LUNGS DAILY. 12 g 3   traMADol (ULTRAM) 50 MG tablet Take 1 tablet (50 mg total) by mouth every 12 (twelve) hours as needed for moderate pain (pain score 4-6). (Patient taking differently: Take 50 mg by mouth 2 (two) times daily.) 180 tablet 1   Zinc 50 MG TABS Take 50 mg by mouth daily.     No facility-administered medications prior to visit.    Review of Systems  Constitutional:  Negative for chills, diaphoresis, fever, malaise/fatigue and weight loss.  HENT:  Negative for congestion.   Respiratory:  Positive for shortness of breath. Negative for cough, hemoptysis, sputum production and wheezing.   Cardiovascular:  Negative for chest pain, palpitations and leg swelling.     Objective:   Vitals:   01/18/24 1010  BP: (!) 108/50  Pulse: 86  Resp: 20  SpO2: 90%  Weight: 163 lb 3.2 oz (74 kg)  Height: 5\' 3"  (1.6 m)   SpO2: 90 %  Physical Exam: General: Well-appearing, no acute distress HENT: [place], AT Eyes: EOMI, no scleral icterus Respiratory: Clear to auscultation bilaterally.  No crackles, wheezing or rales Cardiovascular: RRR, -M/R/G, no JVD Extremities:-Edema,-tenderness Neuro: AAO x4, CNII-XII grossly intact Psych: Normal mood, normal affect  Data Reviewed:  Imaging: CXR ordered this visit. Reviewed and consistent with trace bilateral pleural  effusions and mild pulmonary edema  Assessment & Plan:   Discussion: 83 year old female with COPD, HTN, atrial fibrillation s/p CV, HTN, CKD IIIa, SLE who presents for hospital follow-up. Borderline ambulatory O2. Difficulty sleeping at night due to dyspnea. Today's CXR consistent with mild volume overload. No peripheral edema on exam. Advised additional lasix as noted below. Monitor O2 sats closely.    Acute on chronic diastolic heart failure Hx atrial fibrillation s/p CV, rate-controlled CXR today. Will call about results  Addendum: Reviewed and consistent with trace bilateral pleural effusions and mild pulmonary edema Ambulatory O2 to 89% Recommend wearing 2-3L O2  for goal SpO2 >90% Continue oxygen 3L at night Please take extra lasix 20 mg x 2 days Follow-up with Cardiology on Thursday   Orders Placed This Encounter  Procedures   DG Chest 2 View    Standing Status:   Future    Number of Occurrences:   1    Expiration Date:   01/17/2025    Reason for Exam (SYMPTOM  OR DIAGNOSIS REQUIRED):   shortness of breath    Preferred imaging location?:   [facility]  No orders of the defined types were placed in this encounter.   Return in about 3 months (around 04/17/2024) for with Dr. Judeth Horn.  I have spent a total time of 40-minutes on the day of the appointment reviewing prior documentation, coordinating care and discussing medical diagnosis and plan with the patient/family. Imaging, labs and tests included in this note have been reviewed and interpreted independently by me.  Unique Sillas Mechele Collin, MD [facility] 01/18/2024 1:01 PM

## 2024-01-21 ENCOUNTER — Other Ambulatory Visit: Payer: Self-pay | Admitting: Family Medicine

## 2024-01-21 ENCOUNTER — Encounter: Payer: Self-pay | Admitting: Student

## 2024-01-21 ENCOUNTER — Ambulatory Visit: Payer: Medicare HMO | Attending: Student | Admitting: Student

## 2024-01-21 VITALS — BP 122/70 | HR 73 | Ht 63.0 in | Wt 160.8 lb

## 2024-01-21 DIAGNOSIS — I10 Essential (primary) hypertension: Secondary | ICD-10-CM | POA: Diagnosis not present

## 2024-01-21 DIAGNOSIS — E785 Hyperlipidemia, unspecified: Secondary | ICD-10-CM

## 2024-01-21 DIAGNOSIS — N183 Chronic kidney disease, stage 3 unspecified: Secondary | ICD-10-CM | POA: Diagnosis not present

## 2024-01-21 DIAGNOSIS — I4819 Other persistent atrial fibrillation: Secondary | ICD-10-CM | POA: Diagnosis not present

## 2024-01-21 DIAGNOSIS — I340 Nonrheumatic mitral (valve) insufficiency: Secondary | ICD-10-CM

## 2024-01-21 DIAGNOSIS — R0789 Other chest pain: Secondary | ICD-10-CM

## 2024-01-21 DIAGNOSIS — I350 Nonrheumatic aortic (valve) stenosis: Secondary | ICD-10-CM

## 2024-01-21 DIAGNOSIS — I5033 Acute on chronic diastolic (congestive) heart failure: Secondary | ICD-10-CM | POA: Diagnosis not present

## 2024-01-21 DIAGNOSIS — I5032 Chronic diastolic (congestive) heart failure: Secondary | ICD-10-CM | POA: Diagnosis not present

## 2024-01-21 DIAGNOSIS — I351 Nonrheumatic aortic (valve) insufficiency: Secondary | ICD-10-CM | POA: Diagnosis not present

## 2024-01-21 NOTE — Patient Instructions (Addendum)
Medication Instructions:  The current medical regimen is effective;  continue present plan and medications as directed. Please refer to the Current Medication list given to you today.  *If you need a refill on your cardiac medications before your next appointment, please call your pharmacy*  Lab Work: BMET AND BNP-TODAY  Testing/Procedures: NONE  Follow-Up: At [facility], you and your health needs are our priority.  As part of our continuing mission to provide you with exceptional heart care, we have created designated Provider Care Teams.  These Care Teams include your primary Cardiologist (physician) and Advanced Practice Providers (APPs -  Physician Assistants and Nurse Practitioners) who all work together to provide you with the care you need, when you need it.  Your next appointment:   KEEP SCHEDULED   Provider:   Marjie Skiff, PA-C    Then, DR CAMNITZ wi

## 2024-01-22 ENCOUNTER — Other Ambulatory Visit: Payer: Self-pay

## 2024-01-22 DIAGNOSIS — R0609 Other forms of dyspnea: Secondary | ICD-10-CM

## 2024-01-22 LAB — BASIC METABOLIC PANEL
BUN/Creatinine Ratio: 16 (ref 12–28)
BUN: 18 mg/dL (ref 8–27)
CO2: 21 mmol/L (ref 20–29)
Calcium: 9.7 mg/dL (ref 8.7–10.3)
Chloride: 103 mmol/L (ref 96–106)
Creatinine, Ser: 1.15 mg/dL — ABNORMAL HIGH (ref 0.57–1.00)
Glucose: 95 mg/dL (ref 70–99)
Potassium: 4.6 mmol/L (ref 3.5–5.2)
Sodium: 143 mmol/L (ref 134–144)
eGFR: 48 mL/min/{1.73_m2} — ABNORMAL LOW (ref >59)

## 2024-01-22 LAB — BRAIN NATRIURETIC PEPTIDE: BNP: 191.8 pg/mL — ABNORMAL HIGH (ref 0.0–100.0)

## 2024-01-22 MED ORDER — DAPAGLIFLOZIN PROPANEDIOL 10 MG PO TABS: 10.0000 mg | ORAL_TABLET | Freq: Every day | ORAL | 3 refills | Status: DC

## 2024-01-22 MED ORDER — FUROSEMIDE 20 MG PO TABS: 40.0000 mg | ORAL_TABLET | Freq: Every day | ORAL | 3 refills | Status: DC

## 2024-01-25 ENCOUNTER — Other Ambulatory Visit: Payer: Self-pay

## 2024-01-25 ENCOUNTER — Ambulatory Visit: Payer: Medicare HMO

## 2024-01-25 VITALS — BP 121/60 | Ht 63.0 in | Wt 159.0 lb

## 2024-01-25 DIAGNOSIS — Z0000 Encounter for general adult medical examination without abnormal findings: Secondary | ICD-10-CM | POA: Diagnosis not present

## 2024-01-25 DIAGNOSIS — H9193 Unspecified hearing loss, bilateral: Secondary | ICD-10-CM

## 2024-01-25 DIAGNOSIS — Z1231 Encounter for screening mammogram for malignant neoplasm of breast: Secondary | ICD-10-CM

## 2024-01-25 MED ORDER — DAPAGLIFLOZIN PROPANEDIOL 10 MG PO TABS: 10.0000 mg | ORAL_TABLET | Freq: Every day | ORAL | 3 refills | Status: DC

## 2024-01-25 NOTE — Patient Instructions (Signed)
  Ms. Seyer , Thank you for taking time to come for your Medicare Wellness Visit. I appreciate your ongoing commitment to your health goals. Please review the following plan we discussed and let me know if I can assist you in the future.   These are the goals we discussed:  Goals       Activity and Exercise Increased      She would like to walk more. More exercise. She is going to check into a program with the [facility]      Exercise 3x per week (30 min per time)      Get out and walk at least 3 times a week for 30 minutes at a time.      Medication Management      Patient Goals/Self-Care Activities Over the next 180 days, patient will:  take medications as prescribed  Follow Up Plan: Telephone follow up appointment with care management team member scheduled for:  6 months       Patient Stated (pt-stated)      Would like to be generally healthier.      Patient Stated (pt-stated)      Patient stated that she would like to start eating healthier and exercising.      Weight (lb) < 200 lb (90.7 kg)      Would like to loose at least 10 lbs if not more. 25 lbs.        This is a list of the screening recommended for you and due dates:  Health Maintenance  Topic Date Due   COVID-19 Vaccine (1) Never done   Pneumonia Vaccine (1 of 2 - PCV) Never done   Flu Shot  03/21/2024*   Zoster (Shingles) Vaccine (1 of 2) 03/29/2024*   Medicare Annual Wellness Visit  01/24/2025   DTaP/Tdap/Td vaccine (3 - Td or Tdap) 07/01/2029   DEXA scan (bone density measurement)  Completed   HPV Vaccine  Aged Out   Hepatitis C Screening  Discontinued  *Topic was postponed. The date shown is not the original due date.

## 2024-01-25 NOTE — Addendum Note (Signed)
Addended by: Chalmers Cater on: 01/25/2024 11:27 AM   Modules accepted: Level of Service

## 2024-01-25 NOTE — Progress Notes (Signed)
Subjective:   Rebekah Harrell is a 83 y.o. female who presents for Medicare Annual (Subsequent) preventive examination.  Visit Complete: Virtual I connected with  Rebekah Harrell on 01/25/24 by a audio enabled telemedicine application and verified that I am speaking with the correct person using two identifiers.  Patient Location: Home  Provider Location: Office/Clinic  I discussed the limitations of evaluation and management by telemedicine. The patient expressed understanding and agreed to proceed.  Vital Signs: Because this visit was a virtual/telehealth visit, some criteria may be missing or patient reported. Any vitals not documented were not able to be obtained and vitals that have been documented are patient reported.  Patient Medicare AWV questionnaire was completed by the patient on 01/24/2024; I have confirmed that all information answered by patient is correct and no changes since this date.  Cardiac Risk Factors include: advanced age (>18men, >55 women);hypertension;dyslipidemia     Objective:    Today's Vitals   01/25/24 1054  BP: 121/60  Weight: 159 lb (72.1 kg)  Height: 5\' 3"  (1.6 m)  PainSc: 0-No pain   Body mass index is 28.17 kg/m.     01/25/2024   11:07 AM 12/08/2023   10:45 AM 01/19/2023   11:14 AM 01/15/2022   10:04 AM 04/26/2021    3:12 PM 03/07/2019    1:20 PM 12/27/2018    3:24 PM  Advanced Directives  Does Patient Have a Medical Advance Directive? No No No No No No No  Would patient like information on creating a medical advance directive? No - Patient declined No - Patient declined No - Patient declined No - Patient declined Yes (MAU/Ambulatory/Procedural Areas - Information given) No - Patient declined No - Patient declined    Current Medications (verified) Outpatient Encounter Medications as of 01/25/2024  Medication Sig   apixaban (ELIQUIS) 5 MG TABS tablet TAKE 1 TABLET TWICE DAILY   CALCIUM PO Take 1 tablet by mouth at bedtime.    Cholecalciferol 25 MCG (1000 UT) tablet Take 1,000 Units by mouth daily.   dapagliflozin propanediol (FARXIGA) 10 MG TABS tablet Take 1 tablet (10 mg total) by mouth daily before breakfast.   famotidine (PEPCID) 20 MG tablet TAKE 1 TABLET AT BEDTIME   Fluticasone Furoate (ARNUITY ELLIPTA) 100 MCG/ACT AEPB Inhale 1 puff into the lungs daily.   furosemide (LASIX) 20 MG tablet Take 2 tablets (40 mg total) by mouth daily.   hydroxychloroquine (PLAQUENIL) 200 MG tablet TAKE 1 TABLET EVERY DAY   Magnesium 200 MG TABS Take 1 tablet (200 mg total) by mouth daily.   metoprolol succinate (TOPROL-XL) 25 MG 24 hr tablet Take 1 tablet (25 mg total) by mouth every 12 (twelve) hours.   montelukast (SINGULAIR) 10 MG tablet Take 1 tablet (10 mg total) by mouth at bedtime.   Multiple Vitamins-Minerals (MULTIVITAMIN PO) Take 1 tablet by mouth at bedtime.   omeprazole (PRILOSEC) 20 MG capsule TAKE 1 CAPSULE EVERY DAY   potassium chloride SA (KLOR-CON M) 20 MEQ tablet TAKE 1 TABLET EVERY DAY   rosuvastatin (CRESTOR) 20 MG tablet Take 1 tablet (20 mg total) by mouth daily.   SPIRIVA RESPIMAT 1.25 MCG/ACT AERS INHALE 2 PUFFS INTO THE LUNGS DAILY.   traMADol (ULTRAM) 50 MG tablet Take 1 tablet (50 mg total) by mouth every 12 (twelve) hours as needed for moderate pain (pain score 4-6). (Patient taking differently: Take 50 mg by mouth 2 (two) times daily.)   Zinc 50 MG TABS Take 50 mg by  mouth daily.   No facility-administered encounter medications on file as of 01/25/2024.    Allergies (verified) Ace inhibitors, Atorvastatin, Simvastatin, Yellow dyes (non-tartrazine), Celecoxib, Cetirizine hcl, Penicillins, and Sulfonamide derivatives   History: Past Medical History:  Diagnosis Date   Allergy    Aortic atherosclerosis (HCC)    Arthritis    "lower back; knees" (11/26/2016)   Asthma    Chronic atrial fibrillation (HCC)    CKD (chronic kidney disease), stage III (HCC)    patient unaware of this on 11/26/2016    Complication of anesthesia    "had trouble waking me up in the late 1990's after gallbladder OR"   COPD (chronic obstructive pulmonary disease) (HCC)    GERD (gastroesophageal reflux disease)    Heart murmur    "dx'd when I was a little kid"   History of hiatal hernia    Hyperlipidemia    Hypertension    Hypoglycemia    Past Surgical History:  Procedure Laterality Date   ATRIAL FIBRILLATION ABLATION N/A 12/08/2023   Procedure: ATRIAL FIBRILLATION ABLATION;  Surgeon: Regan Lemming, MD;  Location: [facility] INVASIVE CV LAB;  Service: Cardiovascular;  Laterality: N/A;   CARDIOVERSION N/A 06/10/2023   Procedure: CARDIOVERSION;  Surgeon: Little Ishikawa, MD;  Location: [facility] INVASIVE CV LAB;  Service: Cardiovascular;  Laterality: N/A;   CHOLECYSTECTOMY OPEN  1990s   TONSILLECTOMY     TUBAL LIGATION     Family History  Problem Relation Age of Onset   CAD Mother        HEART ATTACK   Colon cancer Father        KIDNEY FAILURE/COLON CANCER   Atrial fibrillation Brother    Healthy Brother    Heart failure Maternal Grandmother    Kidney failure Maternal Grandfather    Emphysema Maternal Grandfather    Heart attack Paternal Grandmother    Social History   Socioeconomic History   Marital status: Divorced    Spouse name: Not on file   Number of children: 3   Years of education: 14   Highest education level: Some college, no degree  Occupational History   Occupation: stages houses for sale    Comment: self employed   Occupation: Retired.  Tobacco Use   Smoking status: Never    Passive exposure: Past   Smokeless tobacco: Never  Vaping Use   Vaping status: Never Used  Substance and Sexual Activity   Alcohol use: Not Currently   Drug use: No   Sexual activity: Not Currently  Other Topics Concern   Not on file  Social History Narrative   Lives alone. One son lives close by incase she needs anything. She enjoys crafting and decorating.    Social Drivers of Manufacturing engineer Strain: Low Risk  (01/25/2024)   Overall Financial Resource Strain (CARDIA)    Difficulty of Paying Living Expenses: Not hard at all  Recent Concern: Financial Resource Strain - Medium Risk (10/30/2023)   Overall Financial Resource Strain (CARDIA)    Difficulty of Paying Living Expenses: Somewhat hard  Food Insecurity: No Food Insecurity (12/10/2023)   Received from [facility]   Hunger Vital Sign    Worried About Running Out of Food in the Last Year: Never true    Ran Out of Food in the Last Year: Never true  Transportation Needs: No Transportation Needs (01/25/2024)   PRAPARE - Administrator, Civil Service (Medical): No    Lack of Transportation (Non-Medical): No  Physical Activity: Insufficiently Active (01/25/2024)   Exercise Vital Sign    Days of Exercise per Week: 1 day    Minutes of Exercise per Session: 30 min  Stress: No Stress Concern Present (01/25/2024)   [employer] of Occupational Health - Occupational Stress Questionnaire    Feeling of Stress : Only a little  Social Connections: Moderately Isolated (01/25/2024)   Social Connection and Isolation Panel [NHANES]    Frequency of Communication with Friends and Family: More than three times a week    Frequency of Social Gatherings with Friends and Family: Once a week    Attends Religious Services: Never    Database administrator or Organizations: Yes    Attends Engineer, structural: More than 4 times per year    Marital Status: Divorced    Tobacco Counseling Counseling given: Not Answered   Clinical Intake:  Pre-visit preparation completed: Yes  Pain : No/denies pain Pain Score: 0-No pain     BMI - recorded: 28.17 Nutritional Status: BMI 25 -29 Overweight Diabetes: No  How often do you need to have someone help you when you read instructions, pamphlets, or other written materials from your doctor or pharmacy?: 1 - Never What is the last grade level you completed in  school?: 13  Interpreter Needed?: No      Activities of Daily Living    01/25/2024   10:57 AM 01/24/2024    1:33 PM  In your present state of health, do you have any difficulty performing the following activities:  Hearing? 1 1  Comment She noticed problems hearing on the phone.   Vision? 1 1  Comment [facility]   Difficulty concentrating or making decisions? 0 0  Walking or climbing stairs? 1 1  Dressing or bathing? 0 0  Doing errands, shopping? 0 1  Preparing Food and eating ? N N  Using the Toilet? N N  In the past six months, have you accidently leaked urine? N N  Do you have problems with loss of bowel control? N N  Managing your Medications? N N  Managing your Finances? N N  Housekeeping or managing your Housekeeping? Y Y  Comment She is going to hire someone to deep clean.     Patient Care Team: Agapito Games, MD as PCP - General (Family Medicine) Jens Som Madolyn Frieze, MD as PCP - Cardiology (Cardiology) Regan Lemming, MD as PCP - Electrophysiology (Cardiology) Edwyna Ready (Allergy and Immunology) Gabriel Carina, [facility] (Inactive) as Pharmacist (Pharmacist) Luciano Cutter, MD as Consulting Physician (Pulmonary Disease)  Indicate any recent Medical Services you may have received from other than [facility] providers in the past year (date may be approximate).     Assessment:   This is a routine wellness examination for Sakai.  Hearing/Vision screen Hearing Screening - Comments:: Unable to test Vision Screening - Comments:: Unable to test.    Goals Addressed             This Visit's Progress    Activity and Exercise Increased       She would like to walk more. More exercise. She is going to check into a program with the [facility]      Depression Screen    01/25/2024   11:11 AM 07/31/2023    2:35 PM 04/30/2023    1:34 PM 01/19/2023   11:14 AM 12/29/2022    1:45 PM 08/27/2022   11:25 AM 01/15/2022   10:06 AM  PHQ  2/9 Scores  PHQ - 2 Score 0 2  0 1 1 3 4   PHQ- 9 Score  13    13 14     Fall Risk    01/25/2024   11:09 AM 01/24/2024    1:33 PM 07/31/2023    2:08 PM 04/30/2023    1:33 PM 01/19/2023   11:14 AM  Fall Risk   Falls in the past year? 0 0 0 0 1  Number falls in past yr: 0 0 0 0 0  Injury with Fall? 0 0 0 0 1  Risk for fall due to : No Fall Risks  No Fall Risks No Fall Risks History of fall(s)  Follow up Falls evaluation completed  Falls evaluation completed Falls evaluation completed Falls evaluation completed;Education provided;Falls prevention discussed    MEDICARE RISK AT HOME: Medicare Risk at Home Any stairs in or around the home?: Yes If so, are there any without handrails?: Yes Home free of loose throw rugs in walkways, pet beds, electrical cords, etc?: Yes Adequate lighting in your home to reduce risk of falls?: Yes Life alert?: No Use of a cane, walker or w/c?: No Grab bars in the bathroom?: No Shower chair or bench in shower?: No Elevated toilet seat or a handicapped toilet?: No  TIMED UP AND GO:  Was the test performed?  No    Cognitive Function:        01/25/2024   11:11 AM 01/19/2023   11:30 AM 01/15/2022   10:21 AM 03/07/2019    1:25 PM  6CIT Screen  What Year? 0 points 0 points 0 points 0 points  What month? 0 points 0 points 0 points 0 points  What time? 0 points 0 points 0 points 0 points  Count back from 20 0 points 0 points 0 points 0 points  Months in reverse 0 points 0 points 0 points 0 points  Repeat phrase 0 points 0 points 0 points 2 points  Total Score 0 points 0 points 0 points 2 points    Immunizations Immunization History  Administered Date(s) Administered   Td 05/24/2001   Tdap 07/02/2019    TDAP status: Up to date  Flu Vaccine status: Declined, Education has been provided regarding the importance of this vaccine but patient still declined. Advised may receive this vaccine at local pharmacy or Health Dept. Aware to provide a copy of the vaccination record if obtained from  local pharmacy or Health Dept. Verbalized acceptance and understanding.  Pneumococcal vaccine status: Declined,  Education has been provided regarding the importance of this vaccine but patient still declined. Advised may receive this vaccine at local pharmacy or Health Dept. Aware to provide a copy of the vaccination record if obtained from local pharmacy or Health Dept. Verbalized acceptance and understanding.   Covid-19 vaccine status: Declined, Education has been provided regarding the importance of this vaccine but patient still declined. Advised may receive this vaccine at local pharmacy or Health Dept.or vaccine clinic. Aware to provide a copy of the vaccination record if obtained from local pharmacy or Health Dept. Verbalized acceptance and understanding.  Qualifies for Shingles Vaccine? Yes   Zostavax completed No   Shingrix Completed?: No.    Education has been provided regarding the importance of this vaccine. Patient has been advised to call insurance company to determine out of pocket expense if they have not yet received this vaccine. Advised may also receive vaccine at local pharmacy or Health Dept. Verbalized acceptance and understanding.  Screening  Tests Health Maintenance  Topic Date Due   COVID-19 Vaccine (1) Never done   Pneumonia Vaccine 61+ Years old (1 of 2 - PCV) Never done   INFLUENZA VACCINE  03/21/2024 (Originally 07/23/2023)   Zoster Vaccines- Shingrix (1 of 2) 03/29/2024 (Originally 10/20/1960)   Medicare Annual Wellness (AWV)  01/24/2025   DTaP/Tdap/Td (3 - Td or Tdap) 07/01/2029   DEXA SCAN  Completed   HPV VACCINES  Aged Out   Hepatitis C Screening  Discontinued    Health Maintenance  Health Maintenance Due  Topic Date Due   COVID-19 Vaccine (1) Never done   Pneumonia Vaccine 80+ Years old (1 of 2 - PCV) Never done    Colorectal cancer screening: No longer required.   Mammogram status: No longer required due to age.  Bone Density status: Completed  08/26/2023. Results reflect: Bone density results: OSTEOPENIA. Repeat every 2  years.  Lung Cancer Screening: (Low Dose CT Chest recommended if Age 42-80 years, 20 pack-year currently smoking OR have quit w/in 15years.) does not qualify.   Lung Cancer Screening Referral: n/a  Additional Screening:  Hepatitis C Screening: does qualify; Completed 05/03/2021  Vision Screening: Recommended annual ophthalmology exams for early detection of glaucoma and other disorders of the eye. Is the patient up to date with their annual eye exam?  Yes  Who is the provider or what is the name of the office in which the patient attends annual eye exams? [facility] If pt is not established with a provider, would they like to be referred to a provider to establish care? No .   Dental Screening: Recommended annual dental exams for proper oral hygiene  Diabetic Foot Exam:n/a   Community Resource Referral / Chronic Care Management: CRR required this visit?  No   CCM required this visit?  No     Plan:     I have personally reviewed and noted the following in the patient's chart:   Medical and social history Use of alcohol, tobacco or illicit drugs  Current medications and supplements including opioid prescriptions. Patient is currently taking opioid prescriptions. Information provided to patient regarding non-opioid alternatives. Patient advised to discuss non-opioid treatment plan with their provider. Functional ability and status Nutritional status Physical activity Advanced directives List of other physicians Hospitalizations, surgeries, and ER visits in previous 12 months Vitals Screenings to include cognitive, depression, and falls Referrals and appointments  In addition, I have reviewed and discussed with patient certain preventive protocols, quality metrics, and best practice recommendations. A written personalized care plan for preventive services as well as general preventive health  recommendations were provided to patient.     Esmond Harps, CMA   01/25/2024   After Visit Summary: (MyChart) Due to this being a telephonic visit, the after visit summary with patients personalized plan was offered to patient via MyChart   Nurse Notes:   Mammogram ordered.  Audiology referral

## 2024-01-25 NOTE — Telephone Encounter (Signed)
Patient identification verified by 2 forms. [name], RN     Called and spoke to patient. Verified patient wants farxiga sent to [facility] [address] in [place].  Called [facility] and cancelled prescription there.  Patient agrees with plan, no questions at this time

## 2024-01-30 NOTE — Progress Notes (Unsigned)
Cardiology Office Note:    Date:  02/02/2024   ID:  Rebekah Harrell, DOB 1941/12/01, MRN [id number]  PCP:  Agapito Games, MD  Cardiologist:  Olga Millers, MD Electrophysiologist:  Regan Lemming, MD { Click to update primary MD,subspecialty MD or APP then REFRESH:1}    Referring MD: Agapito Games, *   Chief Complaint: follow-up of CHF  History of Present Illness:    Rebekah Harrell is a 83 y.o. female with a history of  persistent atrial fibrillation on Eliquis, chronic diastolic CHF, mild to moderate MR, hypertension, hyperlipidemia, CKD stage III, COPD, and GERD who is followed by Dr. Jens Som and presents today for follow up of CHF.   Patient was initial diagnosed with atrial fibrillation in 05/2016. She was admitted in 11/2022 with atrial fibrillation with RVR and acute diastolic CHF. Echo showed LVEF of 50-55% with normal wall motion and moderate LVH, mildly reduced RV function, and mild MR. She was started on Sotalol and converted to sinus rhythm. Last Echo in 03/2020 showed LVEF of 55-60% with normal wall motion and grade 2 diastolic dysfunction, normal RV function, moderate left atrial enlargement, and mild to moderate MR. She was last seen by Dr. Jens Som in 12/2021 at which time she reported dyspnea on exertion but was otherwise doing well from a cardiac standpoint. Of note, she discontinued Eliquis on her own due to risk of bleeding and expense.    She was seen by me in 04/2023 and was back in atrial fibrillation at that time. She reported that she felt like she had been back in atrial fibrillation for the past month and noted some chest heaviness. She was restarted on Eliquis and underwent successful DCCV on 06/10/2023 with restoration of sinus rhythm. Unfortunately, she had early return of atrial fibrillation that seemed to be related to use of her Symbicort inhaler. She reported that she felt great for 1.5 days with complete resolution of chest heaviness but  then developed significant shortness of breath after not taking her Symbicort inhaler for several days. She felt like this was due to her COPD so she used her Symbicort inhaler and felt like this sent her back into atrial fibrillation. She again had associated chest heaviness with this. [facility] and repeat DCCV was initially arranged and scheduled for the end of 06/2023. However, this was later cancelled due to concerns about the cost and the feeling that an ablation would be the best treatment options. She was seen by Dr. Elberta Fortis and ultimately underwent successful ablation on 12/08/2023.    She was then admitted from 12/10/2023 to 12/24/2023 at [facility] for acute hypoxic respiratory felt to be secondary to bilateral pneumonia, COPD exacerbation, and acute diastolic CHF exacerbation. Pro-BNP was elevated at 3,613 >> 2,038. High-sensitivity troponin was elevated at 494 >> 456.  Echo showed LVEF of 67-70% with normal wall motion and moderate diastolic dysfunction as well as mild AS/ AI and mild MR. Myoview was negative for ischemia. She was treated with antibiotics, steroids, and Lasix. Sotalol was stopped during admission due to prolonged QTc and she was discharged on Metoprolol instead. Home Amlodipine and Hydralazine were stopped at discharge as well to help avoid hypotension.  She was last seen by me on 01/21/2024 at which time she reported significant dyspnea on exertion (especially when walking up stairs), orthopnea, and edema. She also reported occasional chest heaviness that occurred mainly at rest but stated this had improved since recent  hospitalization. BNP was only mildly elevated at 191. Lasix was increased to 40mg  daily and she was started on Farxiga.   Patient presents today for follow-up. Edema significantly Breathing slowly breathing better in the morning gets better throughout the day. Orthopnea improving. Panic attacks. No palpitations, lightheadedness, ddiziness/  syncop.   Pain in foot last night 12 bee stings at ball of foot. Finally went a way.   Chronic Diastolic CHF She was recently admitted at [facility] in 11/2023 for acute hypoxic respiratory failure secondary to bilateral pneumonia, COPD exacerbation, and acute diastolic CHF exacerbation. Echo at that time showed LVEF of 67-70% with normal wall motion and moderate diastolic dysfunction as well as mild AS/ AI and mild MR.  - Doing better on increased dose of Lasix. ***.  - Continue Lasix 40mg  daily.  - Continue Farxiga 10mg  daily.  - Continue daily weights and sodium/ fluid restrictions.  - Will repeat BMET today. ***   Persistent Atrial Fibrillation Initially diagnosed in 2017 and has been on Sotalol. She had done very well on the Sotalol until recently. She was noted to be back in atrial fibrillation at PCP's office in early May. She underwent successful DCCV in 05/2023 with restoration of sinus rhythm. Unfortunately, she had early return of atrial fibrillation. She underwent an ablation in 11/2023.  - Maintaining sinus rhythm on exam.  - Continue Toprol-XL 25mg  twice daily.  - Continue anticoagulation with Eliquis 5mg  twice daily.    Atypical Chest Pain Patient describes some atypical chest heaviness primarily at rest. However, she states this is much improved from before and is occurring less frequently. Myoview during recent admission in 11/2023 was negative.  - *** - No additional ischemic work-up necessary at this time.     Mild to Moderate Mitral Regurgitation  Mild Aortic Stenosis/ Insufficiency Echo in 05/2023 showed moderate MR; however, most recent Echo in 11/2023 showed only mild MR as well as mild AS/ AI. - Can continue to monitor with routine serial Echos.    Hypertension BP well controlled.  - Continue Toprol-XL 25mg  twice daily.    Hyperlipidemia Lipid panel in 08/2022: Total Cholesterol 154, Triglycerides 215, HDL 47, LDL 76.  - Continue Crestor 10mg  daily  - Labs followed  by PCP.   CKD Stage III Baseline creatinine around 0.9 to 1.2.   EKGs/Labs/Other Studies Reviewed:    The following studies were reviewed:  Echocardiogram 06/09/2023: Impressions: 1. Left ventricular ejection fraction, by estimation, is 50 to 55%. The  left ventricle has low normal function. The left ventricle has no regional  wall motion abnormalities. There is mild left ventricular hypertrophy.  Left ventricular diastolic  parameters are indeterminate.   2. Right ventricular systolic function is mildly reduced. The right  ventricular size is mildly enlarged. There is moderately elevated  pulmonary artery systolic pressure. The estimated right ventricular  systolic pressure is 46.9 mmHg.   3. Left atrial size was moderately dilated.   4. Right atrial size was moderately dilated.   5. The mitral valve is degenerative. Moderate mitral valve regurgitation.  Severe mitral annular calcification.   6. The aortic valve is tricuspid. Aortic valve regurgitation is not  visualized. Aortic valve sclerosis/calcification is present, without any  evidence of aortic stenosis.   7. The inferior vena cava is dilated in size with >50% respiratory  variab  _______________   Echocardiogram 12/11/2023 ([facility]): Impressions: Left Ventricle: Systolic function is high normal to borderline  hyperdynamic. EF: 67- 70%.    Left Ventricle: Wall  motion is normal.    Left Ventricle: Doppler parameters consistent with moderate diastolic  dysfunction and elevated LA pressure.    Mitral Valve: There is severe annular calcification.    Mitral Valve: Mitral valve structure is normal. The leaflets are mildly  thickened and exhibit normal excursion.    Mitral Valve: There is mild regurgitation.    Aortic Valve: The aortic valve is tricuspid. The leaflets are mildly  thickened and exhibit normal excursion. The leaflets are mildly calcified.    Aortic Valve: Mild aortic valve regurgitation.    Aortic Valve:  There is mild stenosis.  Peak velocity 2.8 m/s.  Peak  gradient 31 mmHg.  Aortic valve area by planimetry 1.8 cm.  Dimensionless  index 0.38.    Left Atrium: Left atrium is moderately dilated at 4.900 cm. Left atrium  volume index is severely increased (>48 mL/m2).    Tricuspid Valve: The right ventricular systolic pressure is mildly  elevated - 44 mmHg.    Tricuspid Valve: There is mild regurgitation. TR jet velocity 3.2  m/sec.  _______________   Celine Ahr 12/22/2023 ([facility]): Impression: Ejection fraction calculates to 51%. No ischemia   EKG:  EKG not ordered today.   Recent Labs: 04/30/2023: ALT 10; TSH 2.40 11/23/2023: Hemoglobin 14.2; Platelets 279 01/21/2024: BNP 191.8; BUN 18; Creatinine, Ser 1.15; Potassium 4.6; Sodium 143  Recent Lipid Panel    Component Value Date/Time   CHOL 154 08/27/2022 1218   CHOL 148 11/18/2017 1205   TRIG 215 (H) 08/27/2022 1218   HDL 47 (L) 08/27/2022 1218   HDL 38 (L) 11/18/2017 1205   CHOLHDL 3.3 08/27/2022 1218   VLDL 26 06/19/2016 1136   LDLCALC 76 08/27/2022 1218    Physical Exam:    Vital Signs: BP (!) 112/52 (BP Location: Left Arm, Patient Position: Sitting, Cuff Size: Normal)   Pulse 60   Ht 5\' 3"  (1.6 m)   Wt 160 lb (72.6 kg)   BMI 28.34 kg/m     Wt Readings from Last 3 Encounters:  02/02/24 160 lb (72.6 kg)  01/25/24 159 lb (72.1 kg)  01/21/24 160 lb 12.8 oz (72.9 kg)     General: 83 y.o. female in no acute distress. HEENT: Normocephalic and atraumatic. Sclera clear.  Neck: Supple. No JVD. Heart: RRR. Distinct S1 and S2. No murmurs, gallops, or rubs. Soft murmur ? Lungs: No increased work of breathing. Clear to ausculation bilaterally. No wheezes, rhonchi, or rales.  Abdomen: Soft, non-distended, and non-tender to palpation.  Extremities: No lower extremity edema.  Radial, distal pedal, and posterior tibial pulses 2+ and equal bilaterally. Skin: Warm and dry. Neuro: No focal deficits. Psych: Normal affect. Responds  appropriately.   Assessment:    1. Chronic diastolic CHF (congestive heart failure) (HCC)   2. Persistent atrial fibrillation (HCC)   3. Mitral valve insufficiency, unspecified etiology   4. Nonrheumatic aortic valve stenosis     Plan:     Disposition: Follow up in ***   Signed, Corrin Parker, PA-C  02/02/2024 3:50 PM    [facility]

## 2024-02-01 ENCOUNTER — Ambulatory Visit: Payer: Medicare HMO | Admitting: Family Medicine

## 2024-02-02 ENCOUNTER — Ambulatory Visit: Payer: Medicare HMO | Attending: Student | Admitting: Student

## 2024-02-02 ENCOUNTER — Encounter: Payer: Self-pay | Admitting: Student

## 2024-02-02 ENCOUNTER — Other Ambulatory Visit (HOSPITAL_COMMUNITY): Payer: Self-pay

## 2024-02-02 VITALS — BP 112/52 | HR 60 | Ht 63.0 in | Wt 160.0 lb

## 2024-02-02 DIAGNOSIS — E785 Hyperlipidemia, unspecified: Secondary | ICD-10-CM | POA: Diagnosis not present

## 2024-02-02 DIAGNOSIS — I5032 Chronic diastolic (congestive) heart failure: Secondary | ICD-10-CM

## 2024-02-02 DIAGNOSIS — I484 Atypical atrial flutter: Secondary | ICD-10-CM

## 2024-02-02 DIAGNOSIS — I4819 Other persistent atrial fibrillation: Secondary | ICD-10-CM | POA: Diagnosis not present

## 2024-02-02 DIAGNOSIS — I10 Essential (primary) hypertension: Secondary | ICD-10-CM

## 2024-02-02 DIAGNOSIS — N183 Chronic kidney disease, stage 3 unspecified: Secondary | ICD-10-CM

## 2024-02-02 DIAGNOSIS — M79671 Pain in right foot: Secondary | ICD-10-CM

## 2024-02-02 DIAGNOSIS — I350 Nonrheumatic aortic (valve) stenosis: Secondary | ICD-10-CM

## 2024-02-02 DIAGNOSIS — I340 Nonrheumatic mitral (valve) insufficiency: Secondary | ICD-10-CM | POA: Diagnosis not present

## 2024-02-02 NOTE — Patient Instructions (Signed)
    Lab Work:  Your physician recommends that you HAVE LAB WORK TODAY  If you have labs (blood work) drawn today and your tests are completely normal, you will receive your results only by: MyChart Message (if you have MyChart) OR A paper copy in the mail If you have any lab test that is abnormal or we need to change your treatment, we will call you to review the results.   Follow-Up: At [facility], you and your health needs are our priority.  As part of our continuing mission to provide you with exceptional heart care, we have created designated Provider Care Teams.  These Care Teams include your primary Cardiologist (physician) and Advanced Practice Providers (APPs -  Physician Assistants and Nurse Practitioners) who all work together to provide you with the care you need, when you need it.    Your next appointment:   3 month(s)  Provider:   Marjie Skiff PA

## 2024-02-03 ENCOUNTER — Encounter: Payer: Self-pay | Admitting: Student

## 2024-02-03 LAB — MAGNESIUM: Magnesium: 1.7 mg/dL (ref 1.6–2.3)

## 2024-02-03 LAB — BASIC METABOLIC PANEL
BUN/Creatinine Ratio: 15 (ref 12–28)
BUN: 18 mg/dL (ref 8–27)
CO2: 20 mmol/L (ref 20–29)
Calcium: 9.5 mg/dL (ref 8.7–10.3)
Chloride: 104 mmol/L (ref 96–106)
Creatinine, Ser: 1.19 mg/dL — ABNORMAL HIGH (ref 0.57–1.00)
Glucose: 77 mg/dL (ref 70–99)
Potassium: 4.4 mmol/L (ref 3.5–5.2)
Sodium: 144 mmol/L (ref 134–144)
eGFR: 46 mL/min/{1.73_m2} — ABNORMAL LOW (ref >59)

## 2024-02-04 ENCOUNTER — Ambulatory Visit: Payer: Medicare HMO

## 2024-02-04 DIAGNOSIS — K449 Diaphragmatic hernia without obstruction or gangrene: Secondary | ICD-10-CM

## 2024-02-04 DIAGNOSIS — I2510 Atherosclerotic heart disease of native coronary artery without angina pectoris: Secondary | ICD-10-CM

## 2024-02-04 DIAGNOSIS — R918 Other nonspecific abnormal finding of lung field: Secondary | ICD-10-CM

## 2024-02-04 DIAGNOSIS — I700 Atherosclerosis of aorta: Secondary | ICD-10-CM | POA: Diagnosis not present

## 2024-02-04 DIAGNOSIS — R911 Solitary pulmonary nodule: Secondary | ICD-10-CM

## 2024-02-04 DIAGNOSIS — J984 Other disorders of lung: Secondary | ICD-10-CM | POA: Diagnosis not present

## 2024-02-07 ENCOUNTER — Other Ambulatory Visit: Payer: Self-pay | Admitting: Family Medicine

## 2024-02-09 ENCOUNTER — Ambulatory Visit: Payer: Self-pay | Admitting: Family Medicine

## 2024-02-09 ENCOUNTER — Other Ambulatory Visit: Payer: Self-pay | Admitting: Family Medicine

## 2024-02-09 ENCOUNTER — Telehealth: Payer: Self-pay | Admitting: Family Medicine

## 2024-02-09 NOTE — Telephone Encounter (Signed)
metoprolol succinate (TOPROL-XL) 25 MG 24 hr tablet called about refill this morning and pharmacy does not have it, stating it was closed out, please call patient [phone]  Copied from CRM [id number]. Topic: Clinical - Prescription Issue >> Feb 09, 2024  5:10 PM Victorino Dike T wrote: Reason for CRM: metoprolol succinate (TOPROL-XL) 25 MG 24 hr tablet called about refill this morning and pharmacy does not have it, stating it was closed out, please call patient [phone]

## 2024-02-09 NOTE — Telephone Encounter (Signed)
Copied from CRM [id number]. Topic: Clinical - Medication Refill >> Feb 09, 2024 10:10 AM Fuller Mandril wrote: Most Recent Primary Care Visit:  Provider: Chalmers Cater  Department: [facility] CARE [facility]  Visit Type: MEDICARE AWV, SEQUENTIAL  Date: 01/25/2024  Medication: metoprolol succinate (TOPROL-XL) 25 MG 24 hr tablet  Has the patient contacted their pharmacy? Yes (Agent: If no, request that the patient contact the pharmacy for the refill. If patient does not wish to contact the pharmacy document the reason why and proceed with request.) (Agent: If yes, when and what did the pharmacy advise?) has not received response   Is this the correct pharmacy for this prescription? Yes If no, delete pharmacy and type the correct one.  This is the patient's preferred pharmacy:  [id number] - [place], [place] - [address] AT [facility] [address] [place] [address] Phone: [phone] Fax: [phone]  Has the prescription been filled recently? No  Is the patient out of the medication? N/A  Has the patient been seen for an appointment in the last year OR does the patient have an upcoming appointment? Yes  Can we respond through MyChart? Yes  Agent: Please be advised that Rx refills may take up to 3 business days. We ask that you follow-up with your pharmacy.

## 2024-02-11 ENCOUNTER — Telehealth: Payer: Self-pay | Admitting: *Deleted

## 2024-02-11 ENCOUNTER — Other Ambulatory Visit: Payer: Self-pay | Admitting: Family Medicine

## 2024-02-11 ENCOUNTER — Other Ambulatory Visit: Payer: Self-pay

## 2024-02-11 MED ORDER — DAPAGLIFLOZIN PROPANEDIOL 10 MG PO TABS: 10.0000 mg | ORAL_TABLET | Freq: Every day | ORAL | 3 refills | Status: DC

## 2024-02-11 MED ORDER — METOPROLOL SUCCINATE ER 25 MG PO TB24: 25.0000 mg | ORAL_TABLET | Freq: Two times a day (BID) | ORAL | 3 refills | Status: DC

## 2024-02-11 NOTE — Telephone Encounter (Signed)
 This has been addressed.

## 2024-02-11 NOTE — Telephone Encounter (Signed)
Copied from CRM [id number]. Topic: Clinical - Medication Refill >> Feb 11, 2024 10:38 AM Kristie Cowman wrote: Most Recent Primary Care Visit:  Provider: Chalmers Cater  Department: [facility] CARE MKV  Visit Type: MEDICARE AWV, SEQUENTIAL  Date: 01/25/2024  Medication: Metoprolol  Has the patient contacted their pharmacy? Yes (Agent: If no, request that the patient contact the pharmacy for the refill. If patient does not wish to contact the pharmacy document the reason why and proceed with request.) (Agent: If yes, when and what did the pharmacy advise?)  Patient has been out of metoprolol for over a week.  She needs the 5-7 day prescription sent over to [facility] and the 90 day prescription sent to [facility] (fax [phone])  Is this the correct pharmacy for this prescription? Yes If no, delete pharmacy and type the correct one.  This is the patient's preferred pharmacy:  [facility][id number] - [place], [place] - [address] AT [facility] OF [address] [place] [address] Phone: [phone] Fax: [phone]  [facility] - [place], [place] - [address] [place] [address] Phone: [phone] Fax: [phone]   Has the prescription been filled recently? No  Is the patient out of the medication? Yes  Has the patient been seen for an appointment in the last year OR does the patient have an upcoming appointment? Yes  Can we respond through MyChart? No  Agent: Please be advised that Rx refills may take up to 3 business days. We ask that you follow-up with your pharmacy.

## 2024-02-11 NOTE — Telephone Encounter (Signed)
Copied from CRM [id number]. Topic: Clinical - Prescription Issue >> Feb 11, 2024  2:14 PM Nila Nephew wrote: Reason for CRM: Patient calling to state that PCP has neglected to refill her metoprolo for over a week. Patient is demanding to know why PCP denied prescription. Informed patient that, per documentation, Dr. Jens Som has filled the prescription.

## 2024-02-11 NOTE — Telephone Encounter (Signed)
Reviewing  last office note per Thane Edu PA  Persistent Atrial Fibrillation Initially diagnosed in 2017 and has been on Sotalol. She had done very well on the Sotalol until recently. She was noted to be back in atrial fibrillation at PCP's office in early May. She underwent successful DCCV in 05/2023 with restoration of sinus rhythm. Unfortunately, she had early return of atrial fibrillation. She underwent an ablation in 11/2023.  - Maintaining sinus rhythm on exam.  - Continue Toprol-XL 25mg  twice daily.  - Continue anticoagulation with Eliquis 5mg  twice daily.     Marjie Skiff PA-C 02/02/24  Order placed for prescription Metoprolol Succinate - primary cardiologist is Dr Jens Som

## 2024-02-11 NOTE — Telephone Encounter (Signed)
Copied from CRM [id number]. Topic: Clinical - Medication Refill >> Feb 11, 2024 10:32 AM Kristie Cowman wrote: Most Recent Primary Care Visit:  Provider: Chalmers Cater  Department: [facility] CARE [facility]  Visit Type: MEDICARE AWV, SEQUENTIAL  Date: 01/25/2024  Medication: Metoprolol  Has the patient contacted their pharmacy? Yes (Agent: If no, request that the patient contact the pharmacy for the refill. If patient does not wish to contact the pharmacy document the reason why and proceed with request.) (Agent: If yes, when and what did the pharmacy advise?)  Marylene Land from [facility] called and they need the prescription for metoprolol (90 days) sent over to them (fax number - [phone]) and metoprolol 5-7 days sent over to [facility].    Is this the correct pharmacy for this prescription? Yes If no, delete pharmacy and type the correct one.  This is the patient's preferred pharmacy:  [facility] #[id number] - [place], [place] - [address] AT [facility] [address] [place] [address] Phone: [phone] Fax: [phone]  [facility] - [place], [place] - [address] [place] [address] Phone: [phone] Fax: [phone]  [facility] [address], [place] - [address] [place] [address] Phone: [phone] Fax: [phone]   Has the prescription been filled recently? No  Is the patient out of the medication? Yes  Has the patient been seen for an appointment in the last year OR does the patient have an upcoming appointment? Yes  Can we respond through MyChart? No  Agent: Please be advised that Rx refills may take up to 3 business days. We ask that you follow-up with your pharmacy.

## 2024-02-12 NOTE — Telephone Encounter (Signed)
Medication sent in by cardiology.

## 2024-02-15 ENCOUNTER — Other Ambulatory Visit: Payer: Self-pay

## 2024-02-15 MED ORDER — METOPROLOL SUCCINATE ER 25 MG PO TB24: 25.0000 mg | ORAL_TABLET | Freq: Two times a day (BID) | ORAL | 3 refills | Status: DC

## 2024-02-18 ENCOUNTER — Ambulatory Visit (INDEPENDENT_AMBULATORY_CARE_PROVIDER_SITE_OTHER): Payer: Medicare HMO | Admitting: Family Medicine

## 2024-02-18 ENCOUNTER — Encounter: Payer: Self-pay | Admitting: Family Medicine

## 2024-02-18 VITALS — BP 124/63 | HR 59 | Ht 63.0 in | Wt 159.0 lb

## 2024-02-18 DIAGNOSIS — I480 Paroxysmal atrial fibrillation: Secondary | ICD-10-CM | POA: Diagnosis not present

## 2024-02-18 DIAGNOSIS — R079 Chest pain, unspecified: Secondary | ICD-10-CM | POA: Diagnosis not present

## 2024-02-18 DIAGNOSIS — J449 Chronic obstructive pulmonary disease, unspecified: Secondary | ICD-10-CM | POA: Diagnosis not present

## 2024-02-18 DIAGNOSIS — R490 Dysphonia: Secondary | ICD-10-CM

## 2024-02-18 NOTE — Assessment & Plan Note (Signed)
 Continue apixaban.  She was able to finally get her metoprolol and her mail order pharmacy should be shipping out another 90-day supply in the next week.

## 2024-02-18 NOTE — Assessment & Plan Note (Signed)
 Fortunately she developed voice hoarseness on Arnuity.  Interestingly she had had similar side effects when she was on Symbicort.  For now she has continued with her Spiriva I did add the Arnuity to her intolerance list she says the hoarseness has gone away now that she has held the Arnuity for a week or more.  She would prefer to just stick with the Spiriva for now.

## 2024-02-18 NOTE — Telephone Encounter (Signed)
 Refill sent 02/15/24

## 2024-02-18 NOTE — Progress Notes (Signed)
 Established Patient Office Visit  Subjective  Patient ID: Rebekah Harrell, female    DOB: 01-15-1941  Age: 83 y.o. MRN: [id number]  Chief Complaint  Patient presents with   Follow-up    HPI  Here for 6-week follow-up.  She is doing well.  She is off of her oxygen completely in fact the company was supposed to come pick it up yesterday but they did not come back out.    She also had an episode of chest pain this morning that occurred on the left side she said that it radiated down her left arm and into her back.  She was sitting at the table she was not doing anything active at the time.  It lasted about 30 minutes and it finally just eased off.  She has been doing some lifting and putting her house back in order but had not done anything that morning.  COPD-she felt like the Arnuity was giving her voice hoarseness so she stopped it several days ago and feels like her voice is getting better she has continued the Spiriva.  She said she had something similar happen when she was on Symbicort.  She wants to know if she could just continue with the Spiriva for now.    ROS    Objective:     BP 124/63   Pulse (!) 59   Ht 5\' 3"  (1.6 m)   Wt 159 lb (72.1 kg)   SpO2 98%   BMI 28.17 kg/m    Physical Exam Vitals and nursing note reviewed.  Constitutional:      Appearance: Normal appearance.  HENT:     Head: Normocephalic and atraumatic.  Eyes:     Conjunctiva/sclera: Conjunctivae normal.  Cardiovascular:     Rate and Rhythm: Normal rate and regular rhythm.  Pulmonary:     Effort: Pulmonary effort is normal.     Breath sounds: Normal breath sounds.  Skin:    General: Skin is warm and dry.  Neurological:     Mental Status: She is alert.  Psychiatric:        Mood and Affect: Mood normal.      No results found for any visits on 02/18/24.    The ASCVD Risk score (Arnett DK, et al., 2019) failed to calculate for the following reasons:   The 2019 ASCVD risk score is  only valid for ages 50 to 46    Assessment & Plan:   Problem List Items Addressed This Visit       Cardiovascular and Mediastinum   Atrial fibrillation (HCC)   Continue apixaban.  She was able to finally get her metoprolol and her mail order pharmacy should be shipping out another 90-day supply in the next week.        Respiratory   COPD (chronic obstructive pulmonary disease) (HCC)   Fortunately she developed voice hoarseness on Arnuity.  Interestingly she had had similar side effects when she was on Symbicort.  For now she has continued with her Spiriva I did add the Arnuity to her intolerance list she says the hoarseness has gone away now that she has held the Arnuity for a week or more.  She would prefer to just stick with the Spiriva for now.      Other Visit Diagnoses       Left-sided chest pain    -  Primary   Relevant Orders   EKG 12-Lead     Voice hoarseness  Atypical chest pain-unclear etiology we did go ahead and do an EKG today.  The whole episode lasted about 30 minutes but then resolved on its own and she has not had a recurrence.  EKG today shows a rate of 60 bpm with sinus arrhythmia and a PVC.  Otherwise normal.  No significant change compared to prior EKG from January 21, 2024.  Return in about 4 months (around 06/17/2024) for COPD.    Nani Gasser, MD

## 2024-02-23 ENCOUNTER — Encounter: Payer: Self-pay | Admitting: Pulmonary Disease

## 2024-02-25 ENCOUNTER — Ambulatory Visit: Payer: Medicare HMO | Attending: Family Medicine | Admitting: Audiology

## 2024-02-25 DIAGNOSIS — H903 Sensorineural hearing loss, bilateral: Secondary | ICD-10-CM | POA: Diagnosis not present

## 2024-02-25 NOTE — Procedures (Signed)
 [facility] [address] [place], [place]  [address] [phone]  AUDIOLOGICAL  EVALUATION  NAME: Rebekah Harrell     DOB:   12-20-1941      MRN: [id number]                                                                                     DATE: 02/25/2024     REFERENT: Agapito Games, MD STATUS: Outpatient DIAGNOSIS: Sensorineural hearing loss, bilateral  History: Rebekah Harrell was seen for an audiological evaluation due to concerns regarding her hearing sensitivity and left tinnitus. Rebekah Harrell reports she had a fall 3 years ago and hit the left side of her head.  She reports after the fall she noted left tinnitus which she describes as constant. Rebekah Harrell reports she has coped with the tinnitus and utilizes tinnitus management strategies. Rebekah Harrell reports subjectively her hearing sensitivity is better in the right ear and worse in the left ear.  She denies otalgia, aural fullness, and dizziness. Rebekah Harrell reports difficulty hearing speech in the presence of background noise. Rebekah Harrell reports difficulty hearing some phone conversations. Rebekah Harrell reports her dog sits near her left shoulder and barks in her ear and possibly causing her hearing loss.   Evaluation:  Otoscopy showed a clear view of the tympanic membranes, bilaterally Tympanometry results were consistent with normal middle ear pressure and normal tympanic membrane mobility (Type A) bilaterally.  Audiometric testing was completed using Conventional Audiometry techniques with insert earphones and TDH headphones. Test results are consistent in the right ear with normal hearing sensitivity sloping to a mild to moderately-severe sensorineural hearing loss. Test results are consistent in the left ear with normal hearing sensitivity sloping to a moderate to severe sensorineural hearing loss. There is an asymmetric sensorineural hearing loss noted at 2000-8000 Hz, worse in the left ear.  Speech Recognition  Thresholds were obtained at 25 dB HL in the right ear and at 20  dB HL in the left ear. Word Recognition Testing was completed at 70 dB HL and Rebekah Harrell scored 96% in the right ear and 100% in the left ear.    Results:  The test results were reviewed with Rebekah Harrell. Test results are consistent in the right ear with normal hearing sensitivity sloping to a mild to moderately-severe sensorineural hearing loss. Test results are consistent in the left ear with normal hearing sensitivity sloping to a moderate to severe sensorineural hearing loss. There is an asymmetric sensorineural hearing loss noted at 2000-8000 Hz, worse in the left ear. Rebekah Harrell will have hearing and communication difficulty in adverse listening environments. She will benefit from the use of good communication strategies. She will benefit from the use of a hearing aid for the left ear. Tinnitus management strategies were reviewed. A referral to an ENT was reviewed due to asymmetric hearing loss and left tinnitus. The referral to the ENT will need to be placed by Rebekah Harrell's primary care physician.   Recommendations: 1.   Referral to an ENT for left tinnitus and left asymmetric sensorineural hearing loss 2.   Communication needs assessment with an audiologist who dispenses hearing aids if motivated to pursue a hearing aid for  the left ear 3.   Continue to monitor hearing sensitivity   30 minutes spent testing and counseling on results.   If you have any questions please feel free to contact me at [phone].  Marton Redwood Audiologist, Au.D., CCC-A 02/25/2024  3:09 PM  Cc: Agapito Games, MD

## 2024-03-15 NOTE — Progress Notes (Unsigned)
 Electrophysiology Office Note:   Date:  03/16/2024  ID:  Rebekah Harrell, DOB 03-03-1941, MRN [id number]  Primary Cardiologist: Olga Millers, MD Primary Heart Failure: None Electrophysiologist: Will Jorja Loa, MD      History of Present Illness:   Rebekah Harrell is a 83 y.o. female with h/o AF, HTN, HLD, bilateral carotid artery stenosis, aortic atherosclerosis, COPD, GERD, IBS, CKD III, lupus (pt reports is a questionable dx) seen today for routine electrophysiology follow up post ablation.   Since last being seen in our clinic the patient reports she has had a rough few months since her ablation.  She underwent AF ablation on 12/08/23 without complication. She reports she was admitted 12/19 - 12/23/22 to [facility].  She had not been taking her lasix post ablation and she developed shortness of breath.  She was profoundly hypoxic on presentation 69% on RA.  She was admitted with bilateral infiltrates, small pleural effusions and was treated as PNA.  Her sotalol was stopped with QTc of 491 ms and she was started on Toprol XL.  She had a negative stress test while inpatient. She was discharged on O2 and has worked very hard with PT to recover but is doing much better. She notes she occasionally has chest discomfort that she can resolve with movement.   She denies chest pain, palpitations, dyspnea, PND, orthopnea, nausea, vomiting, dizziness, syncope, edema, weight gain, or early satiety.   Review of systems complete and found to be negative unless listed in HPI.   EP Information / Studies Reviewed:    EKG is ordered today. Personal review as below.  EKG Interpretation Date/Time:  Wednesday March 16 2024 14:21:56 EDT Ventricular Rate:  76 PR Interval:  124 QRS Duration:  90 QT Interval:  430 QTC Calculation: 483 R Axis:   77  Text Interpretation: Sinus rhythm with Premature atrial complexes Confirmed by Canary Brim ([id number]) on 03/16/2024 2:30:51 PM   Studies:  ECHO  05/2023 > LVEF 50-55%, LA/RA moderately dilated  CT Cardiac Morphology 11/2023 > normal pulmonary vein drainage into the LA, the LAA is a large chicken wing type, CCS 408 / 73rd percentile for matched controls EPS 12/08/23 >  AF on presentation, successful electrical isolation and anatomical encircling of all four pulmonary veins with PFA, posterior wall isolation using PFA, CTI ablation ECHO 12/13/23 ([facility]) > LVEF 67-70%, mild MV regurgitation, mildly thickened AV leaflets  CT Chest w/Contrast 12/11/23 > neg for PE / dissection, minimal coronary atherosclerosis, small pericardial effusion, bilateral infiltrates consistent with PNA Lexiscan NM Stress 12/23/23 > no ischemia, low normal LV systolic function w/EF 51%  Arrhythmia / AAD AF > initial dx ~2017 DCCV 05/2023     Risk Assessment/Calculations:    CHA2DS2-VASc Score = 5   This indicates a 7.2% annual risk of stroke. The patient's score is based upon: CHF History: 1 HTN History: 1 Diabetes History: 0 Stroke History: 0 Vascular Disease History: 0 Age Score: 2 Gender Score: 1             Physical Exam:   VS:  BP 120/68   Pulse 76   Ht 5\' 3"  (1.6 m)   Wt 161 lb 3.2 oz (73.1 kg)   SpO2 95%   BMI 28.56 kg/m    Wt Readings from Last 3 Encounters:  03/16/24 161 lb 3.2 oz (73.1 kg)  02/18/24 159 lb (72.1 kg)  02/02/24 160 lb (72.6 kg)     GEN: elderly female, appears younger than age,  well nourished, well developed in no acute distress NECK: No JVD; No carotid bruits CARDIAC: Regular rate and rhythm, soft SEM, rubs, gallops RESPIRATORY:  Clear to auscultation without rales, wheezing or rhonchi  ABDOMEN: Soft, non-tender, non-distended EXTREMITIES:  No edema; No deformity   ASSESSMENT AND PLAN:    Persistent Atrial Fibrillation Atypical Atrial Flutter  CHA2DS2-VASc 5, s/p ablation 11/2023  -OAC for stroke prophylaxis  -no symptom burden since ablation, none noted while inpatient   Secondary Hypercoagulable State   -continue Eliquis 5mg  BID, dose reviewed and appropriate by wt / Cr -no bleeding issues  -recent labs reviewed   Chronic Diastolic CHF  -euvolemic on exam   Moderate MV Regurgitation  -per primary cardiology   Recent LLL, R Perihilar Infiltrates  COPD  -hx hiatal hernia, ? Viral PNA vs aspiration  -recovered well  -has slept sitting up for years   Follow up with Dr. Elberta Fortis or EP APP in 6 months  Signed, Canary Brim, NP-C, AGACNP-BC [facility] - Electrophysiology  03/16/2024, 5:15 PM

## 2024-03-16 ENCOUNTER — Ambulatory Visit: Payer: Medicare HMO | Attending: Pulmonary Disease | Admitting: Pulmonary Disease

## 2024-03-16 ENCOUNTER — Other Ambulatory Visit: Payer: Self-pay | Admitting: Family Medicine

## 2024-03-16 ENCOUNTER — Other Ambulatory Visit: Payer: Self-pay | Admitting: Student

## 2024-03-16 ENCOUNTER — Encounter: Payer: Self-pay | Admitting: Pulmonary Disease

## 2024-03-16 ENCOUNTER — Ambulatory Visit: Payer: Medicare HMO | Admitting: Cardiology

## 2024-03-16 VITALS — BP 120/68 | HR 76 | Ht 63.0 in | Wt 161.2 lb

## 2024-03-16 DIAGNOSIS — D6869 Other thrombophilia: Secondary | ICD-10-CM

## 2024-03-16 DIAGNOSIS — I340 Nonrheumatic mitral (valve) insufficiency: Secondary | ICD-10-CM | POA: Diagnosis not present

## 2024-03-16 DIAGNOSIS — K2100 Gastro-esophageal reflux disease with esophagitis, without bleeding: Secondary | ICD-10-CM

## 2024-03-16 DIAGNOSIS — I484 Atypical atrial flutter: Secondary | ICD-10-CM

## 2024-03-16 DIAGNOSIS — R0609 Other forms of dyspnea: Secondary | ICD-10-CM

## 2024-03-16 DIAGNOSIS — I4819 Other persistent atrial fibrillation: Secondary | ICD-10-CM

## 2024-03-16 DIAGNOSIS — I5032 Chronic diastolic (congestive) heart failure: Secondary | ICD-10-CM

## 2024-03-16 NOTE — Patient Instructions (Signed)
 Medication Instructions:  Your physician recommends that you continue on your current medications as directed. Please refer to the Current Medication list given to you today.  *If you need a refill on your cardiac medications before your next appointment, please call your pharmacy*  Lab Work: None ordered If you have labs (blood work) drawn today and your tests are completely normal, you will receive your results only by: MyChart Message (if you have MyChart) OR A paper copy in the mail If you have any lab test that is abnormal or we need to change your treatment, we will call you to review the results.  Follow-Up: At [facility], you and your health needs are our priority.  As part of our continuing mission to provide you with exceptional heart care, we have created designated Provider Care Teams.  These Care Teams include your primary Cardiologist (physician) and Advanced Practice Providers (APPs -  Physician Assistants and Nurse Practitioners) who all work together to provide you with the care you need, when you need it.  Your next appointment:   6 month(s)  Provider:   Canary Brim, NP

## 2024-03-17 MED ORDER — FUROSEMIDE 20 MG PO TABS: 40.0000 mg | ORAL_TABLET | Freq: Every day | ORAL | 3 refills | Status: DC

## 2024-03-30 ENCOUNTER — Ambulatory Visit: Payer: Medicare HMO

## 2024-03-30 DIAGNOSIS — Z1231 Encounter for screening mammogram for malignant neoplasm of breast: Secondary | ICD-10-CM | POA: Diagnosis not present

## 2024-03-31 ENCOUNTER — Ambulatory Visit (INDEPENDENT_AMBULATORY_CARE_PROVIDER_SITE_OTHER): Admitting: Family Medicine

## 2024-03-31 ENCOUNTER — Ambulatory Visit (INDEPENDENT_AMBULATORY_CARE_PROVIDER_SITE_OTHER)

## 2024-03-31 ENCOUNTER — Encounter: Payer: Self-pay | Admitting: Family Medicine

## 2024-03-31 VITALS — BP 129/66 | HR 73 | Ht 63.0 in | Wt 162.0 lb

## 2024-03-31 DIAGNOSIS — S20211A Contusion of right front wall of thorax, initial encounter: Secondary | ICD-10-CM | POA: Diagnosis not present

## 2024-03-31 DIAGNOSIS — W010XXA Fall on same level from slipping, tripping and stumbling without subsequent striking against object, initial encounter: Secondary | ICD-10-CM | POA: Diagnosis not present

## 2024-03-31 DIAGNOSIS — R0789 Other chest pain: Secondary | ICD-10-CM

## 2024-03-31 DIAGNOSIS — J449 Chronic obstructive pulmonary disease, unspecified: Secondary | ICD-10-CM

## 2024-03-31 DIAGNOSIS — M549 Dorsalgia, unspecified: Secondary | ICD-10-CM

## 2024-03-31 DIAGNOSIS — W19XXXA Unspecified fall, initial encounter: Secondary | ICD-10-CM | POA: Diagnosis not present

## 2024-03-31 DIAGNOSIS — R079 Chest pain, unspecified: Secondary | ICD-10-CM | POA: Diagnosis not present

## 2024-03-31 NOTE — Progress Notes (Signed)
 Pt fell 2 weeks ago. She stated that she fell forward and she hit her nose,chin and chest. She stated that she had her purse against her chest which helped to break the fall. She now is c/o chest pain and wonders whether or not she should have a CXR done.   Up until yesterday afternoon she stated that she had not been able to use her inhalers.

## 2024-03-31 NOTE — Assessment & Plan Note (Signed)
 Was not able to use her inhalers for the last couple of weeks because of the chest discomfort but did start using it yesterday and today and does feel a little better so just encouraged her to continue to use her inhalers regularly.

## 2024-03-31 NOTE — Progress Notes (Signed)
 Acute Office Visit  Subjective:     Patient ID: Rebekah Harrell, female    DOB: 03-19-1941, 83 y.o.   MRN: [id number]  Chief Complaint  Patient presents with   Fall        Chest Pain    HPI Patient is in today for    Pt fell 2 weeks ago after tripping.  She was at [employer] and was trying to hurry to get to the bathroom and the rubber on her shoe caught the floor.. She stated that she fell forward and she hit her nose,chin and chest. She stated that she had her purse against her chest which helped to break the fall. She now is c/o chest pain and wonders whether or not she should have a CXR done.  It has been hard to take a deep breath.  She said the pain more so on the right side of the chest and her right mid back.  She says the bruise on her chin seems to be healing well and within a couple of days her right knee no longer hurt.   Up until yesterday afternoon she stated that she had not been able to use her inhalers.  She said she did use it yesterday and today and has felt a little bit better in regards to the shortness of breath.      ROS      Objective:    BP 129/66   Pulse 73   Ht 5\' 3"  (1.6 m)   Wt 162 lb (73.5 kg)   SpO2 95%   BMI 28.70 kg/m    Physical Exam Vitals and nursing note reviewed.  Constitutional:      Appearance: Normal appearance.  HENT:     Head: Normocephalic and atraumatic.  Eyes:     Conjunctiva/sclera: Conjunctivae normal.  Cardiovascular:     Rate and Rhythm: Normal rate and regular rhythm.     Heart sounds: Murmur heard.  Pulmonary:     Effort: Pulmonary effort is normal.     Breath sounds: Normal breath sounds. No wheezing.  Skin:    General: Skin is warm and dry.  Neurological:     Mental Status: She is alert.  Psychiatric:        Mood and Affect: Mood normal.     No results found for any visits on 03/31/24.      Assessment & Plan:   Problem List Items Addressed This Visit       Respiratory   COPD (chronic  obstructive pulmonary disease) (HCC)   Was not able to use her inhalers for the last couple of weeks because of the chest discomfort but did start using it yesterday and today and does feel a little better so just encouraged her to continue to use her inhalers regularly.      Other Visit Diagnoses       Rib contusion, right, initial encounter    -  Primary   Relevant Orders   DG Ribs Unilateral W/Chest Right     Chest wall pain       Relevant Orders   DG Ribs Unilateral W/Chest Right     Fall on same level from slipping, tripping or stumbling, initial encounter       Relevant Orders   DG Ribs Unilateral W/Chest Right      Chest x-ray with rib film for further workup today we will call with results once available continue to use her inhaler since that  does seem to be providing some relief she really cannot take oral and for anti-inflammatories because she is on Eliquis so we discussed using heat and/or ice and maybe trying a topical rub like Biofreeze she has some at home.  Also consider a lidocaine patch.  No orders of the defined types were placed in this encounter.   No follow-ups on file.  Nani Gasser, MD

## 2024-04-01 ENCOUNTER — Encounter: Payer: Self-pay | Admitting: Family Medicine

## 2024-04-01 NOTE — Progress Notes (Signed)
 Please call patient. Normal mammogram.  Repeat in 1 year.

## 2024-04-04 ENCOUNTER — Other Ambulatory Visit: Payer: Self-pay | Admitting: Family Medicine

## 2024-04-05 ENCOUNTER — Encounter: Payer: Self-pay | Admitting: Family Medicine

## 2024-04-05 NOTE — Progress Notes (Signed)
 Hi Rebekah Harrell, x-ray shows no evidence of rib fracture or acute findings in the chest itself.  Little bit of scar tissue in that right lung.  So I think at this point it is probably more consistent with just a contusion versus a fracture it may still take a few weeks for it to start to feel significantly better.  Just continue with your heat or ice whichever feels better and gentle stretches.

## 2024-04-19 ENCOUNTER — Ambulatory Visit: Payer: Medicare HMO | Admitting: Pulmonary Disease

## 2024-04-19 ENCOUNTER — Encounter: Payer: Self-pay | Admitting: Pulmonary Disease

## 2024-04-19 VITALS — BP 134/70 | HR 64 | Ht 63.0 in | Wt 164.2 lb

## 2024-04-19 DIAGNOSIS — J45909 Unspecified asthma, uncomplicated: Secondary | ICD-10-CM

## 2024-04-19 DIAGNOSIS — I509 Heart failure, unspecified: Secondary | ICD-10-CM | POA: Diagnosis not present

## 2024-04-19 DIAGNOSIS — R0609 Other forms of dyspnea: Secondary | ICD-10-CM | POA: Diagnosis not present

## 2024-04-19 DIAGNOSIS — R918 Other nonspecific abnormal finding of lung field: Secondary | ICD-10-CM

## 2024-04-19 DIAGNOSIS — Z7722 Contact with and (suspected) exposure to environmental tobacco smoke (acute) (chronic): Secondary | ICD-10-CM | POA: Diagnosis not present

## 2024-04-19 NOTE — Progress Notes (Signed)
 Synopsis: Referred for COPD by Rebekah Harrell, *  Subjective:   PATIENT ID: Rebekah Harrell GENDER: female DOB: [birth year], MRN: [id number]  Chief Complaint  Patient presents with   Follow-up    Patient states her breathing is getting worse.   83 y.o. with history of allergy , asthma, COPD, AF, CKD, GERD, hiatal hernia, +ANA with recurrent angioedema of unclear etiology on plaquenil  followed by [facility], referred for COPD and worsening DOE. She never had covid-19 infection.  Still dyspneic.  On Arnuity and Spiriva  for asthma/COPD overlap.  Cough seems worse when she uses her Arnuity.  Coughing fits.  Not really helping.  She stopped it and symptoms not worse.  We discussed her status Breo alone.  She had A-fib ablation/2024.  Note reviewed.  She was hospitalized 2 days later with pneumonia and CHF exacerbation with bilateral pleural effusions.  Bilateral pleural effusions seem to increase in size on repeat CT scan presumably in setting of volume resuscitation and sepsis.  She was diuresed and lost per her report nearly 20 pounds.  It looks like her last weight was 10 pounds down from her reported admission weight.  Regardless she felt better.  She is intermittently had dyspnea.  She feels better when she takes extra Lasix  and her weight is less.  We discussed most likely she has intermittent volume overload related to your valvular disease, structural heart disease.  She had a chest x-ray 12/2023 that shows small bilateral pleural effusions.  Repeat/2025 shows small but improved size of bilateral effusions.  She had a repeat CT scan likely groundglass opacities been present since 2023 that showed stable groundglass opacity and new nodule.  I see no sign of pleural effusion on that CT scan.  This was 01/2024.  HPI initial visit: She says she's had breathing problems ever since she was little, attributes it at least in part to secondhand smoke exposure in past.   Now has DOE where she has  stop speaking to catch her breath if she's in conversation however she is able to relate a good history today without interruption. DOE to 100 feet. She has no cough. No orthopnea. Weight has gone up recently. She was on course of prednisone  for 10 days over January prescribed by rheumatologist but she noticed no effect on her breathing. Very rare sensation of dysphagia - doesn't frequently get choked on food/liquid. She snores once in a while. No PND. She is sleepy during the day but attributes it to her antihistamine regimen.   She didn't tolerate wixela well due to the dry powder formulation causing cough.   She sees [facility] [place] sees Rebekah Harrell. On a bunch of antihistamines due to recurrent angioedema of unclear etiology. Has not been on AIT  No family history of lung disease  She worked staging houses in past, Oceanographer in past. Lots of secondhand smoke exposure but she never smoked herself, no vaping/MJ. Has a dog and cat at home.     Past Medical History:  Diagnosis Date   Allergy     Aortic atherosclerosis (HCC)    Arthritis    "lower back; knees" (11/26/2016)   Asthma    Chronic atrial fibrillation (HCC)    CKD (chronic kidney disease), stage III (HCC)    patient unaware of this on 11/26/2016   Complication of anesthesia    "had trouble waking me up in the late 1990's after gallbladder OR"   COPD (chronic obstructive pulmonary disease) (  HCC)    GERD (gastroesophageal reflux disease)    Heart murmur    "dx'd when I was a little kid"   History of hiatal hernia    Hyperlipidemia    Hypertension    Hypoglycemia      Family History  Problem Relation Age of Onset   CAD Mother        HEART ATTACK   Colon cancer Father        KIDNEY FAILURE/COLON CANCER   Atrial fibrillation Brother    Healthy Brother    Heart failure Maternal Grandmother    Kidney failure Maternal Grandfather    Emphysema Maternal Grandfather    Heart attack  Paternal Grandmother      Past Surgical History:  Procedure Laterality Date   ATRIAL FIBRILLATION ABLATION N/A 12/08/2023   Procedure: ATRIAL FIBRILLATION ABLATION;  Surgeon: Rebekah Pump, MD;  Location: [facility] INVASIVE CV LAB;  Service: Cardiovascular;  Laterality: N/A;   CARDIOVERSION N/A 06/10/2023   Procedure: CARDIOVERSION;  Surgeon: Rebekah Hamburg, MD;  Location: [facility] INVASIVE CV LAB;  Service: Cardiovascular;  Laterality: N/A;   CHOLECYSTECTOMY OPEN  1990s   TONSILLECTOMY     TUBAL LIGATION      Social History   Socioeconomic History   Marital status: Divorced    Spouse name: Not on file   Number of children: 3   Years of education: 14   Highest education level: Some college, no degree  Occupational History   Occupation: stages houses for sale    Comment: self employed   Occupation: Retired.  Tobacco Use   Smoking status: Never    Passive exposure: Past   Smokeless tobacco: Never  Vaping Use   Vaping status: Never Used  Substance and Sexual Activity   Alcohol use: Not Currently   Drug use: No   Sexual activity: Not Currently  Other Topics Concern   Not on file  Social History Narrative   Lives alone. One son lives close by incase she needs anything. She enjoys crafting and decorating.    Social Drivers of Health   Financial Resource Strain: Medium Risk (02/15/2024)   Overall Financial Resource Strain (CARDIA)    Difficulty of Paying Living Expenses: Somewhat hard  Food Insecurity: Patient Declined (02/15/2024)   Hunger Vital Sign    Worried About Running Out of Food in the Last Year: Patient declined    Ran Out of Food in the Last Year: Patient declined  Transportation Needs: No Transportation Needs (02/15/2024)   PRAPARE - Administrator, Civil Service (Medical): No    Lack of Transportation (Non-Medical): No  Physical Activity: Insufficiently Active (02/15/2024)   Exercise Vital Sign    Days of Exercise per Week: 1 day    Minutes of  Exercise per Session: 30 min  Stress: Stress Concern Present (02/15/2024)   [employer] of Occupational Health - Occupational Stress Questionnaire    Feeling of Stress : To some extent  Social Connections: Moderately Isolated (02/15/2024)   Social Connection and Isolation Panel [NHANES]    Frequency of Communication with Friends and Family: More than three times a week    Frequency of Social Gatherings with Friends and Family: Once a week    Attends Religious Services: Never    Database administrator or Organizations: No    Attends Engineer, structural: More than 4 times per year    Marital Status: Divorced  Intimate Partner Violence: Not At Risk (01/25/2024)  Humiliation, Afraid, Rape, and Kick questionnaire    Fear of Current or Ex-Partner: No    Emotionally Abused: No    Physically Abused: No    Sexually Abused: No     Allergies  Allergen Reactions   Ace Inhibitors Swelling   Arnuity Ellipta  [Fluticasone  Furoate] Other (See Comments)    Voice hoarseness   Atorvastatin Other (See Comments)    Myalgias   Simvastatin      Myalgia    Yellow Dyes (Non-Tartrazine) Other (See Comments)    Allergy  test   Celecoxib Other (See Comments)    hair loss   Cetirizine Hcl Other (See Comments)    fatigue   Penicillins Other (See Comments)    Unknown childhood reaction    Sulfonamide Derivatives Rash     Outpatient Medications Prior to Visit  Medication Sig Dispense Refill   apixaban  (ELIQUIS ) 5 MG TABS tablet TAKE 1 TABLET TWICE DAILY 180 tablet 1   CALCIUM  PO Take 1 tablet by mouth at bedtime.     Cholecalciferol 25 MCG (1000 UT) tablet Take 1,000 Units by mouth daily.     dapagliflozin  propanediol (FARXIGA ) 10 MG TABS tablet Take 1 tablet (10 mg total) by mouth daily before breakfast. 90 tablet 3   famotidine  (PEPCID ) 20 MG tablet TAKE 1 TABLET AT BEDTIME 90 tablet 3   furosemide  (LASIX ) 20 MG tablet Take 2 tablets (40 mg total) by mouth daily. 180 tablet 3   Magnesium   200 MG TABS Take 1 tablet (200 mg total) by mouth daily. 60 each    metoprolol  succinate (TOPROL -XL) 25 MG 24 hr tablet Take 1 tablet (25 mg total) by mouth every 12 (twelve) hours. 180 tablet 3   montelukast  (SINGULAIR ) 10 MG tablet TAKE 1 TABLET(10 MG) BY MOUTH AT BEDTIME 90 tablet 0   Multiple Vitamins-Minerals (MULTIVITAMIN PO) Take 1 tablet by mouth at bedtime.     omeprazole  (PRILOSEC) 20 MG capsule TAKE 1 CAPSULE EVERY DAY 90 capsule 3   potassium chloride  SA (KLOR-CON  M) 20 MEQ tablet TAKE 1 TABLET EVERY DAY 90 tablet 3   rosuvastatin  (CRESTOR ) 20 MG tablet Take 1 tablet (20 mg total) by mouth daily. 90 tablet 3   traMADol  (ULTRAM ) 50 MG tablet Take 1 tablet (50 mg total) by mouth every 12 (twelve) hours as needed for moderate pain (pain score 4-6). (Patient taking differently: Take 50 mg by mouth 2 (two) times daily.) 180 tablet 1   SPIRIVA  RESPIMAT 1.25 MCG/ACT AERS INHALE 2 PUFFS INTO THE LUNGS DAILY. 12 g 3   No facility-administered medications prior to visit.       Objective:   Physical Exam:  General appearance: 83 y.o., female, NAD, conversant  Eyes: anicteric sclerae; PERRL, tracking appropriately HENT: NCAT; MMM Neck: Trachea midline; no lymphadenopathy, no JVD Lungs: CTAB, no crackles, no wheeze, with normal respiratory effort CV: RRR, no murmur  Abdomen: Soft, non-tender; non-distended, BS present  Extremities: No peripheral edema, warm Skin: Normal turgor and texture; no rash Psych: Appropriate affect Neuro: Alert and oriented to person and place, no focal deficit     Vitals:   04/19/24 1359  BP: (!) 140/70  Pulse: 64  SpO2: 93%  Weight: 164 lb 3.2 oz (74.5 kg)  Height: 5\' 3"  (1.6 m)       93% on RA BMI Readings from Last 3 Encounters:  04/19/24 29.09 kg/m  03/31/24 28.70 kg/m  03/16/24 28.56 kg/m   Wt Readings from Last 3 Encounters:  04/19/24 164 lb  3.2 oz (74.5 kg)  03/31/24 162 lb (73.5 kg)  03/16/24 161 lb 3.2 oz (73.1 kg)      CBC    Component Value Date/Time   WBC 9.0 11/23/2023 1557   WBC 8.4 04/30/2023 1433   RBC 4.98 11/23/2023 1557   RBC 4.88 04/30/2023 1433   HGB 14.2 11/23/2023 1557   HCT 44.5 11/23/2023 1557   PLT 279 11/23/2023 1557   MCV 89 11/23/2023 1557   MCH 28.5 11/23/2023 1557   MCH 27.9 04/30/2023 1433   MCHC 31.9 11/23/2023 1557   MCHC 31.8 (L) 04/30/2023 1433   RDW 13.8 11/23/2023 1557   LYMPHSABS 2,604 04/30/2023 1433   MONOABS 0.7 05/08/2022 1537   EOSABS 143 04/30/2023 1433   BASOSABS 67 04/30/2023 1433    Eos 240  Chest Imaging: CXR 04/06/20 reviewed by me with prominent R hilum  HRCT Chest 6/14 reviewed by me with subtle subpleural reticular opacities, air trapping, RLL 1.49mm ggo, calcified LNs  HRCT Chest 01/22/23 reviewed by me similar to prior  Pulmonary Functions Testing Results:    Latest Ref Rng & Units 07/10/2022    9:53 AM  PFT Results  FVC-Pre L 1.82   FVC-Predicted Pre % 72   FVC-Post L 1.98   FVC-Predicted Post % 79   Pre FEV1/FVC % % 68   Post FEV1/FCV % % 70   FEV1-Pre L 1.23   FEV1-Predicted Pre % 66   FEV1-Post L 1.40   DLCO uncorrected ml/min/mmHg 13.01   DLCO UNC% % 71   DLCO corrected ml/min/mmHg 13.01   DLCO COR %Predicted % 71   DLVA Predicted % 74   TLC L 5.56   TLC % Predicted % 113   RV % Predicted % 149    Personally viewed and interpreted as normal spirometry, no bronchodilator response, air trapping on lung volumes, mildly reduced diffusing capacity   Echocardiogram:   TTE 04/11/20: 1. Left ventricular ejection fraction, by estimation, is 55 to 60%. The  left ventricle has normal function. The left ventricle has no regional  wall motion abnormalities. Left ventricular diastolic parameters are  consistent with Grade II diastolic  dysfunction (pseudonormalization).   2. Right ventricular systolic function is normal. The right ventricular  size is normal. There is mildly elevated pulmonary artery systolic  pressure.   3.  Left atrial size was moderately dilated.   4. The mitral valve is normal in structure. Mild to moderate mitral valve  regurgitation. No evidence of mitral stenosis.   5. The aortic valve is normal in structure. Aortic valve regurgitation is  not visualized. No aortic stenosis is present.   6. The inferior vena cava is normal in size with greater than 50%  respiratory variability, suggesting right atrial pressure of 3 mmHg.   7. Compared to echocardiogram from 2017 MR from mild now is mild to  moderate, LA size increased to moderate enlargement from mild. Disatolic  dysfunction.   TTE 06/09/23  1. Left ventricular ejection fraction, by estimation, is 50 to 55%. The  left ventricle has low normal function. The left ventricle has no regional  wall motion abnormalities. There is mild left ventricular hypertrophy.  Left ventricular diastolic  parameters are indeterminate.   2. Right ventricular systolic function is mildly reduced. The right  ventricular size is mildly enlarged. There is moderately elevated  pulmonary artery systolic pressure. The estimated right ventricular  systolic pressure is 46.9 mmHg.   3. Left atrial size was moderately dilated.  4. Right atrial size was moderately dilated.   5. The mitral valve is degenerative. Moderate mitral valve regurgitation.  Severe mitral annular calcification.   6. The aortic valve is tricuspid. Aortic valve regurgitation is not  visualized. Aortic valve sclerosis/calcification is present, without any  evidence of aortic stenosis.   7. The inferior vena cava is dilated in size with >50% respiratory  variability, suggesting right atrial pressure of 8 mmHg.       Assessment & Plan:   Dyspnea on exertion: Suspect multifactorial but largely due to cardiac disease given significant valvular disease, atrial fibrillation, evidence of pulmonary venous hypertension with dilated left atrium.  Also with possible contribution of asthma.  Air trapping  on PFTs.  Tolerating Arnuity, Spiriva  for ICS/LAMA therapy.  Avoiding LABA given atrial fibrillation.  However this is unlikely be significant contributor to atrial fibrillation burden.  That is likely largely driven by her valvular heart disease.  Worsened after ablation with intermittent signs of volume overload on imaging and admission 11/2023 with CHF exacerbation. Pulmonary vein stenosis? But volume overload is not new, was issue prior to ablation. Message sent to cardiology colleagues.  Asthma: Well controlled, cough worse on arnuity - stop Arnuity. Continue Spiriva .  Chronic Lung nodule: 1 cm GGO.  Stable on follow-up scan 01/2024.  Initially noted 05/2022.  Repeat scan 01/2025 ordered. Plan to follow through 2027  New lung nodule: seen 01/2024. 1 year repeat CT ordered as above.    Guerry Leek, MD [facility] 04/19/2024 2:24 PM   I spent 41 minutes in the care of the patient including face-to-face visit, review of records, coordination of care.

## 2024-04-19 NOTE — Patient Instructions (Signed)
 Okay to stop Arnuity since it seems to make things worse  Continue Spiriva   Keep an eye on your weight, if increases by 2 pounds in 24 hours I would recommend taking additional Lasix .  Or if weight is increased by 5 pounds over the course of 5 days I would take an additional Lasix .  I will message your cardiology team.  Will repeat a CT scan in February 2026 to keep an eye on the small nodules.  Everything looks okay otherwise.  Return to clinic in 3 months or sooner as needed with Dr. Marygrace Snellen

## 2024-04-23 NOTE — Progress Notes (Deleted)
 Cardiology Office Note:    Date:  04/23/2024   ID:  KRYSTAN STEARNES, DOB [birth year], MRN [id number]  PCP:  [name], MD  Cardiologist:  Alexandria Angel, MD Electrophysiologist:  Lei Pump, MD { Click to update primary MD,subspecialty MD or APP then REFRESH:1}    Referring MD: [name], *   Chief Complaint: follow-up of CHF and atrial fibrillation  History of Present Illness:    ALMETER MORINE is a 83 y.o. female with a history of persistent atrial fibrillation on Eliquis , chronic diastolic CHF, mild to moderate MR, hypertension, hyperlipidemia, CKD stage III, COPD, and GERD who is followed by Dr. Audery Blazing and presents today for routine follow-up of CHF and atrial fibrillation.  Patient was initial diagnosed with atrial fibrillation in 05/2016. She was admitted in 11/2022 with atrial fibrillation with RVR and acute diastolic CHF. Echo showed LVEF of 50-55% with normal wall motion and moderate LVH, mildly reduced RV function, and mild MR. She was started on Sotalol  and converted to sinus rhythm. Last Echo in 03/2020 showed LVEF of 55-60% with normal wall motion and grade 2 diastolic dysfunction, normal RV function, moderate left atrial enlargement, and mild to moderate MR. She was last seen by Dr. Audery Blazing in 12/2021 at which time she reported dyspnea on exertion but was otherwise doing well from a cardiac standpoint. Of note, she discontinued Eliquis  on her own due to risk of bleeding and expense.   She was seen by me in 04/2023 and was back in atrial fibrillation at that time. She reported that she felt like she had been back in atrial fibrillation for the past month and noted some chest heaviness. She was restarted on Eliquis  and underwent successful DCCV on 06/10/2023 with restoration of sinus rhythm. Unfortunately, she had early return of atrial fibrillation that seemed to be related to use of her Symbicort  inhaler. She reported that she felt great for 1.5  days with complete resolution of chest heaviness but then developed significant shortness of breath after not taking her Symbicort  inhaler for several days. She felt like this was due to her COPD so she used her Symbicort  inhaler and felt like this sent her back into atrial fibrillation. She again had associated chest heaviness with this. [facility] and repeat DCCV was initially arranged and scheduled for the end of 06/2023. However, this was later cancelled due to concerns about the cost and the feeling that an ablation would be the best treatment options. She was seen by Dr. Lawana Pray and ultimately underwent successful ablation on 12/08/2023.   She was then admitted from 12/10/2023 to 12/24/2023 at [facility] for acute hypoxic respiratory felt to be secondary to bilateral pneumonia, COPD exacerbation, and acute diastolic CHF exacerbation. Pro-BNP was elevated at 3,613 >> 2,038. High-sensitivity troponin was elevated at 494 >> 456.  Echo showed LVEF of 67-70% with normal wall motion and moderate diastolic dysfunction as well as mild AS/ AI and mild MR. Myoview was negative for ischemia. She was treated with antibiotics, steroids, and Lasix . Sotalol  was stopped during admission due to prolonged QTc and she was discharged on Metoprolol  instead. Home Amlodipine  and Hydralazine  were stopped at discharge as well to help avoid hypotension.   She was seen by me in 12/2023 at which time Lasix  was increased and she was started on Farxiga  due to significant dyspnea on exertion, orthopnea, and edema. She was last seen by me in 01/2024 at which time she  was feeling better. Her edema had resolved and her dyspnea on exertion and orthopnea were improving. She last seen by Creighton Doffing, NP with EP, in 02/2024 at which time she was maintaining sinus rhythm and was stable from a cardiac standpoint.   She was recently seen by Dr. Marygrace Snellen (Pulmonology) on 04/19/2024 at which time she reported persistent dyspnea  on exertion. This was felt to be multifactorial but he felt this was "largely due to cardiac disease given significant valvular disease, atrial fibrillation, evidence of pulmonary hypertension with dilated left atrium" and felt like she was likely having intermittent episodes of volume overload.   Patient presents today for follow-up. ***  Chronic Diastolic CHF She was admitted at [facility] in 11/2023 for acute hypoxic respiratory failure secondary to bilateral pneumonia, COPD exacerbation, and acute diastolic CHF exacerbation. Echo at that time showed LVEF of 67-70% with normal wall motion and moderate diastolic dysfunction as well as mild AS/ AI and mild MR.  - Euvolemic on exam.  - Continue Lasix  40mg  daily.  - Continue Farxiga  10mg  daily.  - Continue daily weights and sodium/ fluid restrictions.    Persistent Atrial Fibrillation Initially diagnosed in 2017 and has been on Sotalol . She had done very well on the Sotalol  until recently. She was noted to be back in atrial fibrillation at PCP's office in early May. She underwent successful DCCV in 05/2023 with restoration of sinus rhythm. Unfortunately, she had early return of atrial fibrillation. She underwent an ablation in 11/2023.  - Maintaining sinus rhythm on exam.  - Continue Toprol -XL 25mg  twice daily.  - Continue anticoagulation with Eliquis  5mg  twice daily.    Atypical Chest Pain *** Patient describes some atypical chest heaviness primarily at rest. However, she states this is much improved from before and is occurring less frequently. Myoview during recent admission in 11/2023 was negative.  - Chest heaviness resolved with increased dose of Lasix  so suspect it was due to volume overload.  - No additional ischemic work-up necessary at this time.     Mild to Moderate Mitral Regurgitation  Mild Aortic Stenosis/ Insufficiency Echo in 05/2023 showed moderate MR; however, most recent Echo in 11/2023 showed only mild MR as well as mild AS/  AI. - Can continue to monitor with routine serial Echos.    Hypertension BP well controlled. *** - Continue Toprol -XL 25mg  twice daily.    Hyperlipidemia Lipid panel in 08/2022: Total Cholesterol 154, Triglycerides 215, HDL 47, LDL 76. *** - Continue Crestor  10mg  daily  - Labs followed by PCP.   CKD Stage III Baseline creatinine around 0.9 to 1.2.   EKGs/Labs/Other Studies Reviewed:    The following studies were reviewed:  Echocardiogram 06/09/2023: Impressions: 1. Left ventricular ejection fraction, by estimation, is 50 to 55%. The  left ventricle has low normal function. The left ventricle has no regional  wall motion abnormalities. There is mild left ventricular hypertrophy.  Left ventricular diastolic  parameters are indeterminate.   2. Right ventricular systolic function is mildly reduced. The right  ventricular size is mildly enlarged. There is moderately elevated  pulmonary artery systolic pressure. The estimated right ventricular  systolic pressure is 46.9 mmHg.   3. Left atrial size was moderately dilated.   4. Right atrial size was moderately dilated.   5. The mitral valve is degenerative. Moderate mitral valve regurgitation.  Severe mitral annular calcification.   6. The aortic valve is tricuspid. Aortic valve regurgitation is not  visualized. Aortic valve sclerosis/calcification is present, without any  evidence of aortic stenosis.   7. The inferior vena cava is dilated in size with >50% respiratory  variab  _______________   Echocardiogram 12/11/2023 ([facility]): Impressions: Left Ventricle: Systolic function is high normal to borderline  hyperdynamic. EF: 67- 70%.    Left Ventricle: Wall motion is normal.    Left Ventricle: Doppler parameters consistent with moderate diastolic  dysfunction and elevated LA pressure.    Mitral Valve: There is severe annular calcification.    Mitral Valve: Mitral valve structure is normal. The leaflets are mildly  thickened and  exhibit normal excursion.    Mitral Valve: There is mild regurgitation.    Aortic Valve: The aortic valve is tricuspid. The leaflets are mildly  thickened and exhibit normal excursion. The leaflets are mildly calcified.    Aortic Valve: Mild aortic valve regurgitation.    Aortic Valve: There is mild stenosis.  Peak velocity 2.8 m/s.  Peak  gradient 31 mmHg.  Aortic valve area by planimetry 1.8 cm.  Dimensionless  index 0.38.    Left Atrium: Left atrium is moderately dilated at 4.900 cm. Left atrium  volume index is severely increased (>48 mL/m2).    Tricuspid Valve: The right ventricular systolic pressure is mildly  elevated - 44 mmHg.    Tricuspid Valve: There is mild regurgitation. TR jet velocity 3.2  m/sec.  _______________  Dickie Found 12/22/2023 ([facility]): Impression: Ejection fraction calculates to 51%. No ischemia    EKG:  EKG not ordered today.  Recent Labs: 04/30/2023: ALT 10; TSH 2.40 11/23/2023: Hemoglobin 14.2; Platelets 279 01/21/2024: BNP 191.8 02/02/2024: BUN 18; Creatinine, Ser 1.19; Magnesium  1.7; Potassium 4.4; Sodium 144  Recent Lipid Panel    Component Value Date/Time   CHOL 154 08/27/2022 1218   CHOL 148 11/18/2017 1205   TRIG 215 (H) 08/27/2022 1218   HDL 47 (L) 08/27/2022 1218   HDL 38 (L) 11/18/2017 1205   CHOLHDL 3.3 08/27/2022 1218   VLDL 26 06/19/2016 1136   LDLCALC 76 08/27/2022 1218    Physical Exam:    Vital Signs: There were no vitals taken for this visit.    Wt Readings from Last 3 Encounters:  04/19/24 164 lb 3.2 oz (74.5 kg)  03/31/24 162 lb (73.5 kg)  03/16/24 161 lb 3.2 oz (73.1 kg)     General: 83 y.o. female in no acute distress. HEENT: Normocephalic and atraumatic. Sclera clear.  Neck: Supple. No carotid bruits. No JVD. Heart: *** RRR. Distinct S1 and S2. No murmurs, gallops, or rubs.  Lungs: No increased work of breathing. Clear to ausculation bilaterally. No wheezes, rhonchi, or rales.  Abdomen: Soft, non-distended, and  non-tender to palpation.  Extremities: No lower extremity edema.  Radial and distal pedal pulses 2+ and equal bilaterally. Skin: Warm and dry. Neuro: No focal deficits. Psych: Normal affect. Responds appropriately.   Assessment:    No diagnosis found.  Plan:     Disposition: Follow up in ***   Signed, Casimer Clear, PA-C  04/23/2024 4:49 PM    [facility]

## 2024-05-03 ENCOUNTER — Ambulatory Visit: Payer: Medicare HMO | Admitting: Student

## 2024-05-20 ENCOUNTER — Other Ambulatory Visit (INDEPENDENT_AMBULATORY_CARE_PROVIDER_SITE_OTHER): Payer: Self-pay

## 2024-05-20 DIAGNOSIS — M4316 Spondylolisthesis, lumbar region: Secondary | ICD-10-CM

## 2024-05-20 NOTE — Telephone Encounter (Deleted)
 Opened in error

## 2024-05-20 NOTE — Telephone Encounter (Signed)
 Patient is requesting a refill on Tramadol .   Last fill 11/02/24 last visit 11/02/24  Pended prescription.

## 2024-05-23 MED ORDER — TRAMADOL HCL 50 MG PO TABS: 50.0000 mg | ORAL_TABLET | Freq: Two times a day (BID) | ORAL | 0 refills | Status: DC | PRN

## 2024-05-23 NOTE — Telephone Encounter (Signed)
 Copied from CRM [id number]. Topic: Clinical - Prescription Issue >> May 20, 2024  1:39 PM Rebekah Harrell H wrote: Reason for CRM: Patient is calling because she is wanting someone from the clinic to call her once the provider signs off on her rx. I spoke with Rebekah Harrell who helped me about the statu of the medicine. I informed her that we are just waiting on the provider to sign off on it and she said okay and just wants a call once its done.

## 2024-05-23 NOTE — Telephone Encounter (Signed)
..  PDMP reviewed during this encounter. No concerns.  Sent one month until visit with Dr. Melven Stable.

## 2024-06-08 ENCOUNTER — Other Ambulatory Visit: Payer: Self-pay | Admitting: Cardiology

## 2024-06-08 ENCOUNTER — Other Ambulatory Visit: Payer: Self-pay | Admitting: Pulmonary Disease

## 2024-06-08 DIAGNOSIS — I4819 Other persistent atrial fibrillation: Secondary | ICD-10-CM

## 2024-06-08 NOTE — Progress Notes (Signed)
 Cardiology Office Note:    Date:  06/09/2024   ID:  Rebekah Harrell, DOB 1941-12-08, MRN [id number]  PCP:  Alvan Dorothyann BIRCH, MD  Cardiologist:  Redell Shallow, MD Electrophysiologist:  Soyla Gladis Norton, MD     Referring MD: Alvan Dorothyann BIRCH, *   Chief Complaint: follow-up of CHF and atrial fibrillation   History of Present Illness:    Rebekah Harrell is a 83 y.o. female with a history of persistent atrial fibrillation s/p ablation in 11/2023 on Eliquis , chronic diastolic CHF, mild to moderate MR, hypertension, hyperlipidemia, CKD stage III, COPD, and GERD who is followed by Dr. Shallow and presents today for routine follow-up of CHF and atrial fibrillation.  Patient was initial diagnosed with atrial fibrillation in 05/2016. She was admitted in 11/2022 with atrial fibrillation with RVR and acute diastolic CHF. Echo showed LVEF of 50-55% with normal wall motion and moderate LVH, mildly reduced RV function, and mild MR. She was started on Sotalol  and converted to sinus rhythm. Last Echo in 03/2020 showed LVEF of 55-60% with normal wall motion and grade 2 diastolic dysfunction, normal RV function, moderate left atrial enlargement, and mild to moderate MR. She was last seen by Dr. Shallow in 12/2021 at which time she reported dyspnea on exertion but was otherwise doing well from a cardiac standpoint. Of note, she discontinued Eliquis  on her own due to risk of bleeding and expense.   She was seen by me in 04/2023 and was back in atrial fibrillation at that time. She reported that she felt like she had been back in atrial fibrillation for the past month and noted some chest heaviness. She was restarted on Eliquis  and underwent successful DCCV in 05/2023 with restoration of sinus rhythm. Unfortunately, she had early return of atrial fibrillation that seemed to be related to use of her Symbicort  inhaler. She reported that she felt great for 1.5 days with complete resolution of chest  heaviness but then developed significant shortness of breath after not taking her Symbicort  inhaler for several days. She felt like this was due to her COPD so she used her Symbicort  inhaler and felt like this sent her back into atrial fibrillation. She again had associated chest heaviness with this. She was referred to the [facility] and repeat DCCV was initially arranged and scheduled for the end of 06/2023. However, this was later cancelled due to concerns about the cost and the feeling that an ablation would be the best treatment options. She was seen by Dr. Norton and ultimately underwent successful ablation in 11/2023  She was then admitted from 12/10/2023 to 12/24/2023 at [facility] for acute hypoxic respiratory felt to be secondary to bilateral pneumonia, COPD exacerbation, and acute diastolic CHF exacerbation. Pro-BNP was elevated at 3,613 >> 2,038. High-sensitivity troponin was elevated at 494 >> 456.  Echo showed LVEF of 67-70% with normal wall motion and moderate diastolic dysfunction as well as mild AS/ AI and mild MR. Myoview was negative for ischemia. She was treated with antibiotics, steroids, and Lasix . Sotalol  was stopped during admission due to prolonged QTc and she was discharged on Metoprolol  instead. Home Amlodipine  and Hydralazine  were stopped at discharge as well to help avoid hypotension.   She was seen by me in 12/2023 at which time Lasix  was increased and she was started on Farxiga  due to significant dyspnea on exertion, orthopnea, and edema. She was last seen by me in 01/2024 at which time she was feeling better. Her edema had  resolved and her dyspnea on exertion and orthopnea were improving. She last seen by Daphne Barrack, NP with EP, in 02/2024 at which time she was maintaining sinus rhythm and was stable from a cardiac standpoint.   She was recently seen by Dr. Annella (Pulmonology) on 04/19/2024 at which time she reported persistent dyspnea on exertion. This was felt to be  multifactorial but he felt this was largely due to cardiac disease given significant valvular disease, atrial fibrillation, evidence of pulmonary hypertension with dilated left atrium and felt like she was likely having intermittent episodes of volume overload.   Patient presents today for follow-up.  Patient's main complaints today are fatigue/decreased energy level and continued dyspnea on exertion.  She states she has had chronic dyspnea on exertion that has been getting progressively worse over the last decade.  However, compared to a year ago she feels her breathing is stable.  She also has had some orthopnea since her hospitalization for pneumonia earlier this year but this is stable.  She denies any PND. She decreased her Lasix  from 40mg  to 20 mg once daily because she felt like the higher dose was drying out her skin.  Her weight is remaining stable on this current dose and she has had no recurrent edema.  She also reports she has no energy and describes getting very tired with minor housework.  This is not new but she does feel like it is gotten worse since her admission for pneumonia.  She was started on metoprolol  during that admission so question whether this is the cause of her worsening fatigue.  She has had some atypical chest pain since the time of her atrial fibrillation ablation.  She is describes this as a brief sharp pain in her chest that only occurs at rest.  No exertional chest pain.  She states this is getting better with time though and is not occurring as frequently.  She also had a mechanical fall (slipped on the floor) a couple months ago and landed on her chest/face.  She had some clear chest wall pain after this for about 2 weeks but this has since resolved.  Neither of these 2 types of pain sound like angina.  She has not had any known recurrent atrial fibrillation since her ablation and denies any palpitations, lightheadedness, dizziness, or syncope  EKGs/Labs/Other Studies  Reviewed:    Echocardiogram 06/09/2023: Impressions: 1. Left ventricular ejection fraction, by estimation, is 50 to 55%. The  left ventricle has low normal function. The left ventricle has no regional  wall motion abnormalities. There is mild left ventricular hypertrophy.  Left ventricular diastolic  parameters are indeterminate.   2. Right ventricular systolic function is mildly reduced. The right  ventricular size is mildly enlarged. There is moderately elevated  pulmonary artery systolic pressure. The estimated right ventricular  systolic pressure is 46.9 mmHg.   3. Left atrial size was moderately dilated.   4. Right atrial size was moderately dilated.   5. The mitral valve is degenerative. Moderate mitral valve regurgitation.  Severe mitral annular calcification.   6. The aortic valve is tricuspid. Aortic valve regurgitation is not  visualized. Aortic valve sclerosis/calcification is present, without any  evidence of aortic stenosis.   7. The inferior vena cava is dilated in size with >50% respiratory  variab  _______________   Echocardiogram 12/11/2023 ([facility]): Impressions: Left Ventricle: Systolic function is high normal to borderline  hyperdynamic. EF: 67- 70%.    Left Ventricle: Wall motion is normal.  Left Ventricle: Doppler parameters consistent with moderate diastolic  dysfunction and elevated LA pressure.    Mitral Valve: There is severe annular calcification.    Mitral Valve: Mitral valve structure is normal. The leaflets are mildly  thickened and exhibit normal excursion.    Mitral Valve: There is mild regurgitation.    Aortic Valve: The aortic valve is tricuspid. The leaflets are mildly  thickened and exhibit normal excursion. The leaflets are mildly calcified.    Aortic Valve: Mild aortic valve regurgitation.    Aortic Valve: There is mild stenosis.  Peak velocity 2.8 m/s.  Peak  gradient 31 mmHg.  Aortic valve area by planimetry 1.8 cm.  Dimensionless   index 0.38.    Left Atrium: Left atrium is moderately dilated at 4.900 cm. Left atrium  volume index is severely increased (>48 mL/m2).    Tricuspid Valve: The right ventricular systolic pressure is mildly  elevated - 44 mmHg.    Tricuspid Valve: There is mild regurgitation. TR jet velocity 3.2  m/sec.  _______________   Pablo 12/22/2023 ([facility]): Impression: Ejection fraction calculates to 51%. No ischemia      EKG:  EKG not ordered today.  Recent Labs: 11/23/2023: Hemoglobin 14.2; Platelets 279 01/21/2024: BNP 191.8 02/02/2024: BUN 18; Creatinine, Ser 1.19; Magnesium  1.7; Potassium 4.4; Sodium 144  Recent Lipid Panel    Component Value Date/Time   CHOL 154 08/27/2022 1218   CHOL 148 11/18/2017 1205   TRIG 215 (H) 08/27/2022 1218   HDL 47 (L) 08/27/2022 1218   HDL 38 (L) 11/18/2017 1205   CHOLHDL 3.3 08/27/2022 1218   VLDL 26 06/19/2016 1136   LDLCALC 76 08/27/2022 1218    Physical Exam:    Vital Signs: BP 132/78   Pulse (!) 59   Ht 5' 3 (1.6 m)   Wt 160 lb 12.8 oz (72.9 kg)   SpO2 94%   BMI 28.48 kg/m     Wt Readings from Last 3 Encounters:  06/09/24 160 lb 12.8 oz (72.9 kg)  04/19/24 164 lb 3.2 oz (74.5 kg)  03/31/24 162 lb (73.5 kg)     General: 83 y.o. Caucasian female in no acute distress. HEENT: Normocephalic and atraumatic. Neck: Supple. No JVD. Heart: RRR. II-III/ VI systolic murmur. No murmurs, gallops, or rubs.  Lungs: No increased work of breathing. Clear to ausculation bilaterally. No wheezes, rhonchi, or rales.  Extremities: No lower extremity edema.   Skin: Warm and dry. Neuro: No focal deficits. Psych: Normal affect. Responds appropriately.   Assessment:    1. Dyspnea on exertion   2. Chronic diastolic CHF (congestive heart failure) (HCC)   3. Persistent atrial fibrillation (HCC)   4. Mitral valve insufficiency, unspecified etiology   5. Aortic valve stenosis, etiology of cardiac valve disease unspecified   6. Aortic valve  insufficiency, etiology of cardiac valve disease unspecified   7. Primary hypertension   8. Hyperlipidemia, unspecified hyperlipidemia type   9. Stage 3 chronic kidney disease, unspecified whether stage 3a or 3b CKD (HCC)   10. Fatigue, unspecified type     Plan:    Dyspnea on Exertion Chronic Diastolic CHF She was admitted at [facility] in 11/2023 for acute hypoxic respiratory failure secondary to bilateral pneumonia, COPD exacerbation, and acute diastolic CHF exacerbation. Echo at that time showed LVEF of 67-70% with normal wall motion and moderate diastolic dysfunction as well as mild AS/ AI and mild MR.  - She continues to have chronic dyspnea on exertion but is euvolemic on exam.  Weight is down 4lbs since last visit. - Continue Lasix  20mg  daily.  - Continue Farxiga  10mg  daily.  - Patient thinks her dyspnea is largely pulmonary in nature and I agree with this. No additional cardiac work-up necessary at this time.   Persistent Atrial Fibrillation Initially diagnosed in 2017 and has been on Sotalol . She had done very well on the Sotalol  until recently. She was noted to be back in atrial fibrillation at PCP's office in early May. She underwent successful DCCV in 05/2023 with restoration of sinus rhythm. Unfortunately, she had early return of atrial fibrillation. She underwent an ablation in 11/2023.  - Maintaining sinus rhythm on exam.  - Currently on Toprol -XL 25mg  twice daily. Will stop this given reports of significant fatigue and start Diltiazem  120mg  daily instead.  - Continue anticoagulation with Eliquis  5mg  twice daily.    Atypical Chest Pain  Patient has a history of atypical chest pain. Myoview during admission at [facility] in 11/2023 was negative.  - She continues to have atypical chest pain that does not sound like angina. - No additional ischemic work-up necessary at this time.     Mild to Moderate Mitral Regurgitation  Mild Aortic Stenosis/ Insufficiency Echo in 05/2023 showed  moderate MR; however, most recent Echo in 11/2023 showed only mild MR as well as mild AS/ AI. - Can continue to monitor with routine serial Echos.    Hypertension BP initially mildly elevated at 140/68 but improved to 132/78 on my personal recheck at the end of visit.  - Will stop Toprol -XL and switch Diltiazem  120mg  daily as above.    Hyperlipidemia Lipid panel in 08/2022: Total Cholesterol 154, Triglycerides 215, HDL 47, LDL 76.  - Continue Crestor  10mg  daily  - She is due for repeat labs. She is not fasting today but will come back for lipid panel and CMET. She would like to do this at a [facility] facility closer to her house.  CKD Stage III Baseline creatinine around 0.9 to 1.2.   Fatigue Patient reports significant fatigue that has gotten worse over the last 6 months.  - Will stop Toprol -XL and switch to Diltiazem  as above to see if this helps.  - Will check TSH when she comes back for fasting labs.  Disposition: Follow up in 4 months.   Signed, Aline FORBES Door, PA-C  06/09/2024 7:02 PM    [facility]

## 2024-06-08 NOTE — Telephone Encounter (Signed)
 Prescription refill request for Eliquis  received. Indication:afib Last office visit:3/25 Scr:1.19  2/25 Age: 83 Weight:74.5  kg  Prescription refilled

## 2024-06-09 ENCOUNTER — Other Ambulatory Visit (HOSPITAL_COMMUNITY): Payer: Self-pay

## 2024-06-09 ENCOUNTER — Ambulatory Visit: Attending: Student | Admitting: Student

## 2024-06-09 ENCOUNTER — Encounter: Payer: Self-pay | Admitting: Student

## 2024-06-09 VITALS — BP 132/78 | HR 59 | Ht 63.0 in | Wt 160.8 lb

## 2024-06-09 DIAGNOSIS — I340 Nonrheumatic mitral (valve) insufficiency: Secondary | ICD-10-CM

## 2024-06-09 DIAGNOSIS — I351 Nonrheumatic aortic (valve) insufficiency: Secondary | ICD-10-CM | POA: Diagnosis not present

## 2024-06-09 DIAGNOSIS — I350 Nonrheumatic aortic (valve) stenosis: Secondary | ICD-10-CM | POA: Diagnosis not present

## 2024-06-09 DIAGNOSIS — R5383 Other fatigue: Secondary | ICD-10-CM

## 2024-06-09 DIAGNOSIS — I10 Essential (primary) hypertension: Secondary | ICD-10-CM

## 2024-06-09 DIAGNOSIS — R0609 Other forms of dyspnea: Secondary | ICD-10-CM | POA: Diagnosis not present

## 2024-06-09 DIAGNOSIS — E785 Hyperlipidemia, unspecified: Secondary | ICD-10-CM

## 2024-06-09 DIAGNOSIS — N183 Chronic kidney disease, stage 3 unspecified: Secondary | ICD-10-CM

## 2024-06-09 DIAGNOSIS — I4819 Other persistent atrial fibrillation: Secondary | ICD-10-CM

## 2024-06-09 DIAGNOSIS — I5032 Chronic diastolic (congestive) heart failure: Secondary | ICD-10-CM | POA: Diagnosis not present

## 2024-06-09 MED ORDER — DILTIAZEM HCL ER COATED BEADS 120 MG PO CP24
120.0000 mg | ORAL_CAPSULE | Freq: Every day | ORAL | 3 refills | Status: DC
  Filled 2024-06-09: qty 90, 90d supply, fill #0
  Filled 2024-08-24: qty 90, 90d supply, fill #1

## 2024-06-09 MED ORDER — FUROSEMIDE 20 MG PO TABS: 20.0000 mg | ORAL_TABLET | Freq: Every day | ORAL | Status: DC

## 2024-06-09 NOTE — Patient Instructions (Addendum)
 Medication Instructions:  - STOP Metoprolol  - START Diltiazem  120 mg taking 1 daily *If you need a refill on your cardiac medications before your next appointment, please call your pharmacy*  Lab Work: GO TO A [facility] NEAR YOU, FASTING, FOR:  LIPID, CMET, & TSH  If you have labs (blood work) drawn today and your tests are completely normal, you will receive your results only by: MyChart Message (if you have MyChart) OR A paper copy in the mail If you have any lab test that is abnormal or we need to change your treatment, we will call you to review the results.  Testing/Procedures: None ordered  Follow-Up: At [facility], you and your health needs are our priority.  As part of our continuing mission to provide you with exceptional heart care, our providers are all part of one team.  This team includes your primary Cardiologist (physician) and Advanced Practice Providers or APPs (Physician Assistants and Nurse Practitioners) who all work together to provide you with the care you need, when you need it.  Your next appointment:   4-6 month(s)  Provider:   Callie Goodrich, PA-C          We recommend signing up for the patient portal called MyChart.  Sign up information is provided on this After Visit Summary.  MyChart is used to connect with patients for Virtual Visits (Telemedicine).  Patients are able to view lab/test results, encounter notes, upcoming appointments, etc.  Non-urgent messages can be sent to your provider as well.   To learn more about what you can do with MyChart, go to [email or web address].   Other Instructions

## 2024-06-17 ENCOUNTER — Ambulatory Visit: Payer: Medicare HMO | Admitting: Family Medicine

## 2024-06-20 ENCOUNTER — Ambulatory Visit (INDEPENDENT_AMBULATORY_CARE_PROVIDER_SITE_OTHER): Admitting: Family Medicine

## 2024-06-20 VITALS — BP 107/76 | HR 92 | Ht 63.0 in | Wt 162.1 lb

## 2024-06-20 DIAGNOSIS — J449 Chronic obstructive pulmonary disease, unspecified: Secondary | ICD-10-CM | POA: Diagnosis not present

## 2024-06-20 DIAGNOSIS — N183 Chronic kidney disease, stage 3 unspecified: Secondary | ICD-10-CM

## 2024-06-20 DIAGNOSIS — S0992XA Unspecified injury of nose, initial encounter: Secondary | ICD-10-CM | POA: Diagnosis not present

## 2024-06-20 DIAGNOSIS — I10 Essential (primary) hypertension: Secondary | ICD-10-CM | POA: Diagnosis not present

## 2024-06-20 DIAGNOSIS — M4316 Spondylolisthesis, lumbar region: Secondary | ICD-10-CM

## 2024-06-20 MED ORDER — TRAMADOL HCL 50 MG PO TABS: 50.0000 mg | ORAL_TABLET | Freq: Two times a day (BID) | ORAL | 2 refills | Status: DC | PRN

## 2024-06-20 MED ORDER — STIOLTO RESPIMAT 2.5-2.5 MCG/ACT IN AERS: 2.0000 | INHALATION_SPRAY | Freq: Every day | RESPIRATORY_TRACT | 1 refills | Status: DC

## 2024-06-20 NOTE — Assessment & Plan Note (Signed)
 Doing a follow renal function every 6 months and we will plan to do a urine microalbumin yearly.

## 2024-06-20 NOTE — Assessment & Plan Note (Signed)
 Blood pressure is a little on the lower side today after his recent switch of medication.  She is not feeling lightheaded or dizzy so we discussed just checking her blood pressure a couple times this week and a couple of times next week and seeing if blood pressures are staying well-controlled.  If they are on the low end then she may want to reach back out to cardiology or me and we could slightly decrease the diltiazem  if needed.

## 2024-06-20 NOTE — Patient Instructions (Signed)
 Check your blood pressure at home a couple times this week and a couple times next week again if pressures are still staying low feel free to send those to me or to the cardiologist and we can make a slight adjustment with the Cardizem .

## 2024-06-20 NOTE — Progress Notes (Unsigned)
 Established Patient Office Visit  Subjective  Patient ID: Rebekah Harrell, female    DOB: 1941-06-16  Age: 83 y.o. MRN: [id number]  Chief Complaint  Patient presents with   COPD    HPI  F/U COPD -she is currently on Spiriva .  She is not really sure how much it is helping.  Now it has been really hot and humid and the air quality days have been low she tries to stay inside is much as she can.  F/U HTN -no recent chest pain or shortness of breath in fact she recently just saw the cardiologist and her blood pressure was in the 130s.  They discontinued her metoprolol  and switch her to diltiazem .  {History (Optional):23778}  ROS    Objective:     BP 107/76   Pulse 92   Ht 5' 3 (1.6 m)   Wt 162 lb 1.9 oz (73.5 kg)   SpO2 96%   BMI 28.72 kg/m  {Vitals History (Optional):23777}  Physical Exam Vitals and nursing note reviewed.  Constitutional:      Appearance: Normal appearance.  HENT:     Head: Normocephalic and atraumatic.     Nose:     Comments: Her nose and cartilage is actually tilted towards the left.  Eyes:     Conjunctiva/sclera: Conjunctivae normal.    Cardiovascular:     Rate and Rhythm: Normal rate and regular rhythm.  Pulmonary:     Effort: Pulmonary effort is normal.     Breath sounds: Normal breath sounds.   Skin:    General: Skin is warm and dry.   Neurological:     Mental Status: She is alert.   Psychiatric:        Mood and Affect: Mood normal.      No results found for any visits on 06/20/24.  {Labs (Optional):23779}  The ASCVD Risk score (Arnett DK, et al., 2019) failed to calculate for the following reasons:   The 2019 ASCVD risk score is only valid for ages 30 to 61    Assessment & Plan:   Problem List Items Addressed This Visit       Cardiovascular and Mediastinum   HYPERTENSION, BENIGN ESSENTIAL   Blood pressure is a little on the lower side today after his recent switch of medication.  She is not feeling lightheaded or  dizzy so we discussed just checking her blood pressure a couple times this week and a couple of times next week and seeing if blood pressures are staying well-controlled.  If they are on the low end then she may want to reach back out to cardiology or me and we could slightly decrease the diltiazem  if needed.        Respiratory   COPD (chronic obstructive pulmonary disease) (HCC) - Primary   Relevant Medications   Tiotropium Bromide-Olodaterol (STIOLTO RESPIMAT) 2.5-2.5 MCG/ACT AERS     Musculoskeletal and Integument   Spondylolisthesis at L4-L5 level   Relevant Medications   traMADol  (ULTRAM ) 50 MG tablet     Genitourinary   CKD (chronic kidney disease) stage 3, GFR 30-59 ml/min (HCC)   Doing a follow renal function every 6 months and we will plan to do a urine microalbumin yearly.      Other Visit Diagnoses       Injury of nose, initial encounter       Relevant Orders   Ambulatory referral to ENT      Significant distortion of the cartilage to the left  on her nose so I think seeing an ENT would be really helpful she says right now she can barely breathe out of 1 side of her nose it got worse after she face planted at [employer] she had already had an original injury when she was a kid dove into the pool but now it is much more noticeable deviation.  Return in about 6 months (around 12/20/2024) for Hypertension.    Dorothyann Byars, MD

## 2024-06-21 ENCOUNTER — Encounter: Payer: Self-pay | Admitting: Family Medicine

## 2024-06-29 ENCOUNTER — Other Ambulatory Visit: Payer: Self-pay | Admitting: Student

## 2024-06-29 DIAGNOSIS — I350 Nonrheumatic aortic (valve) stenosis: Secondary | ICD-10-CM | POA: Diagnosis not present

## 2024-06-29 DIAGNOSIS — I351 Nonrheumatic aortic (valve) insufficiency: Secondary | ICD-10-CM | POA: Diagnosis not present

## 2024-06-29 DIAGNOSIS — I10 Essential (primary) hypertension: Secondary | ICD-10-CM | POA: Diagnosis not present

## 2024-06-29 DIAGNOSIS — N183 Chronic kidney disease, stage 3 unspecified: Secondary | ICD-10-CM | POA: Diagnosis not present

## 2024-06-29 DIAGNOSIS — I340 Nonrheumatic mitral (valve) insufficiency: Secondary | ICD-10-CM | POA: Diagnosis not present

## 2024-06-29 DIAGNOSIS — E785 Hyperlipidemia, unspecified: Secondary | ICD-10-CM | POA: Diagnosis not present

## 2024-06-29 DIAGNOSIS — I4819 Other persistent atrial fibrillation: Secondary | ICD-10-CM | POA: Diagnosis not present

## 2024-06-29 DIAGNOSIS — I5032 Chronic diastolic (congestive) heart failure: Secondary | ICD-10-CM | POA: Diagnosis not present

## 2024-06-30 LAB — COMPREHENSIVE METABOLIC PANEL WITH GFR
ALT: 7 IU/L (ref 0–32)
AST: 20 IU/L (ref 0–40)
Albumin: 4.1 g/dL (ref 3.7–4.7)
Alkaline Phosphatase: 89 IU/L (ref 44–121)
BUN/Creatinine Ratio: 12 (ref 12–28)
BUN: 16 mg/dL (ref 8–27)
Bilirubin Total: 0.7 mg/dL (ref 0.0–1.2)
CO2: 20 mmol/L (ref 20–29)
Calcium: 9.5 mg/dL (ref 8.7–10.3)
Chloride: 104 mmol/L (ref 96–106)
Creatinine, Ser: 1.34 mg/dL — ABNORMAL HIGH (ref 0.57–1.00)
Globulin, Total: 2.7 g/dL (ref 1.5–4.5)
Glucose: 91 mg/dL (ref 70–99)
Potassium: 4.6 mmol/L (ref 3.5–5.2)
Sodium: 140 mmol/L (ref 134–144)
Total Protein: 6.8 g/dL (ref 6.0–8.5)
eGFR: 40 mL/min/1.73 — ABNORMAL LOW (ref >59)

## 2024-06-30 LAB — LIPID PANEL
Chol/HDL Ratio: 2.7 ratio (ref 0.0–4.4)
Cholesterol, Total: 125 mg/dL (ref 100–199)
HDL: 47 mg/dL (ref >39)
LDL Chol Calc (NIH): 56 mg/dL (ref 0–99)
Triglycerides: 124 mg/dL (ref 0–149)
VLDL Cholesterol Cal: 22 mg/dL (ref 5–40)

## 2024-06-30 LAB — TSH: TSH: 4.55 u[IU]/mL — ABNORMAL HIGH (ref 0.450–4.500)

## 2024-07-01 ENCOUNTER — Emergency Department (HOSPITAL_BASED_OUTPATIENT_CLINIC_OR_DEPARTMENT_OTHER)

## 2024-07-01 ENCOUNTER — Emergency Department (HOSPITAL_BASED_OUTPATIENT_CLINIC_OR_DEPARTMENT_OTHER)
Admission: EM | Admit: 2024-07-01 | Discharge: 2024-07-02 | Disposition: A | Discharge status: Home / Self Care | Attending: Emergency Medicine | Admitting: Emergency Medicine

## 2024-07-01 ENCOUNTER — Telehealth: Payer: Self-pay | Admitting: Student

## 2024-07-01 ENCOUNTER — Other Ambulatory Visit: Payer: Self-pay

## 2024-07-01 ENCOUNTER — Ambulatory Visit
Admission: EM | Admit: 2024-07-01 | Discharge: 2024-07-01 | Disposition: A | Discharge status: Home / Self Care | Attending: Family Medicine | Admitting: Family Medicine

## 2024-07-01 ENCOUNTER — Emergency Department (HOSPITAL_COMMUNITY)

## 2024-07-01 ENCOUNTER — Encounter (HOSPITAL_BASED_OUTPATIENT_CLINIC_OR_DEPARTMENT_OTHER): Payer: Self-pay

## 2024-07-01 DIAGNOSIS — I129 Hypertensive chronic kidney disease with stage 1 through stage 4 chronic kidney disease, or unspecified chronic kidney disease: Secondary | ICD-10-CM | POA: Insufficient documentation

## 2024-07-01 DIAGNOSIS — Z79899 Other long term (current) drug therapy: Secondary | ICD-10-CM | POA: Insufficient documentation

## 2024-07-01 DIAGNOSIS — J449 Chronic obstructive pulmonary disease, unspecified: Secondary | ICD-10-CM | POA: Insufficient documentation

## 2024-07-01 DIAGNOSIS — R55 Syncope and collapse: Secondary | ICD-10-CM

## 2024-07-01 DIAGNOSIS — I10 Essential (primary) hypertension: Secondary | ICD-10-CM

## 2024-07-01 DIAGNOSIS — I6782 Cerebral ischemia: Secondary | ICD-10-CM | POA: Diagnosis not present

## 2024-07-01 DIAGNOSIS — J984 Other disorders of lung: Secondary | ICD-10-CM | POA: Diagnosis not present

## 2024-07-01 DIAGNOSIS — Z7901 Long term (current) use of anticoagulants: Secondary | ICD-10-CM | POA: Diagnosis not present

## 2024-07-01 DIAGNOSIS — R42 Dizziness and giddiness: Secondary | ICD-10-CM | POA: Diagnosis not present

## 2024-07-01 DIAGNOSIS — Z743 Need for continuous supervision: Secondary | ICD-10-CM | POA: Diagnosis not present

## 2024-07-01 DIAGNOSIS — R918 Other nonspecific abnormal finding of lung field: Secondary | ICD-10-CM | POA: Diagnosis not present

## 2024-07-01 DIAGNOSIS — N183 Chronic kidney disease, stage 3 unspecified: Secondary | ICD-10-CM | POA: Insufficient documentation

## 2024-07-01 DIAGNOSIS — I517 Cardiomegaly: Secondary | ICD-10-CM | POA: Diagnosis not present

## 2024-07-01 DIAGNOSIS — G4489 Other headache syndrome: Secondary | ICD-10-CM | POA: Diagnosis not present

## 2024-07-01 DIAGNOSIS — R911 Solitary pulmonary nodule: Secondary | ICD-10-CM | POA: Diagnosis not present

## 2024-07-01 DIAGNOSIS — R0989 Other specified symptoms and signs involving the circulatory and respiratory systems: Secondary | ICD-10-CM | POA: Diagnosis not present

## 2024-07-01 DIAGNOSIS — I672 Cerebral atherosclerosis: Secondary | ICD-10-CM | POA: Diagnosis not present

## 2024-07-01 LAB — POCT URINALYSIS DIP (MANUAL ENTRY)
Bilirubin, UA: NEGATIVE
Blood, UA: NEGATIVE
Glucose, UA: 500 mg/dL — AB
Ketones, POC UA: NEGATIVE mg/dL
Nitrite, UA: NEGATIVE
Protein Ur, POC: NEGATIVE mg/dL
Spec Grav, UA: <=1.005 — AB (ref 1.010–1.025)
Urobilinogen, UA: 0.2 U/dL — NL
pH, UA: 6 (ref 5.0–8.0)

## 2024-07-01 LAB — URINALYSIS, ROUTINE W REFLEX MICROSCOPIC
Bilirubin Urine: NEGATIVE
Glucose, UA: >=500 mg/dL — AB
Hgb urine dipstick: NEGATIVE
Ketones, ur: NEGATIVE mg/dL
Nitrite: NEGATIVE
Protein, ur: NEGATIVE mg/dL
Specific Gravity, Urine: 1.01 (ref 1.005–1.030)
pH: 6 (ref 5.0–8.0)

## 2024-07-01 LAB — CBC
HCT: 40 % (ref 36.0–46.0)
Hemoglobin: 13.2 g/dL (ref 12.0–15.0)
MCH: 27.7 pg (ref 26.0–34.0)
MCHC: 33 g/dL (ref 30.0–36.0)
MCV: 84 fL (ref 80.0–100.0)
Platelets: 265 K/uL (ref 150–400)
RBC: 4.76 MIL/uL (ref 3.87–5.11)
RDW: 15.2 % (ref 11.5–15.5)
WBC: 10 K/uL (ref 4.0–10.5)
nRBC: 0 % (ref 0.0–0.2)

## 2024-07-01 LAB — COMPREHENSIVE METABOLIC PANEL WITH GFR
ALT: 8 U/L (ref 0–44)
AST: 21 U/L (ref 15–41)
Albumin: 4.5 g/dL (ref 3.5–5.0)
Alkaline Phosphatase: 99 U/L (ref 38–126)
Anion gap: 15 (ref 5–15)
BUN: 9 mg/dL (ref 8–23)
CO2: 23 mmol/L (ref 22–32)
Calcium: 9.8 mg/dL (ref 8.9–10.3)
Chloride: 105 mmol/L (ref 98–111)
Creatinine, Ser: 1.16 mg/dL — ABNORMAL HIGH (ref 0.44–1.00)
GFR, Estimated: 47 mL/min — ABNORMAL LOW (ref >60)
Glucose, Bld: 95 mg/dL (ref 70–99)
Potassium: 3.7 mmol/L (ref 3.5–5.1)
Sodium: 142 mmol/L (ref 135–145)
Total Bilirubin: 0.6 mg/dL (ref 0.0–1.2)
Total Protein: 7.9 g/dL (ref 6.5–8.1)

## 2024-07-01 LAB — POCT FASTING CBG KUC MANUAL ENTRY: POCT Glucose (KUC): 129 mg/dL — AB (ref 70–99)

## 2024-07-01 LAB — URINALYSIS, MICROSCOPIC (REFLEX): RBC / HPF: NONE SEEN RBC/hpf (ref 0–5)

## 2024-07-01 LAB — TROPONIN T, HIGH SENSITIVITY
Troponin T High Sensitivity: <15 ng/L (ref <19)
Troponin T High Sensitivity: 15 ng/L (ref <19)

## 2024-07-01 LAB — CBG MONITORING, ED: Glucose-Capillary: 93 mg/dL (ref 70–99)

## 2024-07-01 LAB — POCT RAPID STREP A (OFFICE)

## 2024-07-01 MED ORDER — ACETAMINOPHEN 325 MG PO TABS
650.0000 mg | ORAL_TABLET | Freq: Once | ORAL | Status: AC
  Administered 2024-07-01: 650 mg via ORAL
  Filled 2024-07-01: qty 2

## 2024-07-01 MED ORDER — IOHEXOL 300 MG/ML  SOLN
75.0000 mL | Freq: Once | INTRAMUSCULAR | Status: AC | PRN
  Administered 2024-07-01: 75 mL via INTRAVENOUS

## 2024-07-01 NOTE — ED Notes (Signed)
 Pt. Reports she checked her B/P at 10am today and her B/P was 148/80s and HR was in 90s and her HR felt to be rising and then B/P started rising.  Pt. Reports she has history of the AF and she was concerned and called cardiology.  Pt. Went to UC and Pt. Was told to come into ED due to poss. Electrolytes.  Pt. Also has c/o headache.    On arrival to ED no confusion in Pt.  Pt. Reports pressure on arrival to ED some feeling of syncope and never any LOC  just a feeling.  Pt. Always alert and able to describe her feelings.  Pt. Skin warm and dry with noted color WNL.  Pt. Able to pass swallow eval and grips equal on both sides.  Pt. Walks steady gait to restroom and has no c/o nausea on arrival to room 10

## 2024-07-01 NOTE — ED Triage Notes (Signed)
 Pt states that she started having some dizziness and lightheadedness yesterday afternoon. At that time her bp was 140's/80's. This morning, pt bp has increased up to systolic 170's-180's. Pt's cardiologist told her to be seen at an Urgent Care. She was checked there this am and sent here for further testing. Pt also had some constipation yesterday. Pt denies HA, N/V.   Aleck FORBES Molt, RN

## 2024-07-01 NOTE — ED Triage Notes (Signed)
 Pt presents to uc with co near syncope yesterday afternoon with high bp this morning at home.  Reports recent constipation that changed to loose stools yesterday. Pt denies any abd pain, nausea or vomiting or cramps but has had a decreased appetite.  Pt reports she did not take lasix  this morning due to the pcp thinking she may be dehydrated when speaking to them about her symptoms they requested she be seen here.

## 2024-07-01 NOTE — ED Provider Notes (Addendum)
 [place] [facility] Provider Note   CSN: [id number] Arrival date & time: 07/01/24  1403     Patient presents with: Hypertension   Rebekah Harrell is a 83 y.o. female.   Patient with known history of hypertension.  Also known history of atrial fibrillation was had an ablation and is on Eliquis .  Patient's blood pressure medicine recently changed from a beta-blocker to Cardizem  CD.  But that was on June 19.  So she has been on that for a while.  Patient with a complaint of her blood pressure being high past couple days some near syncope feeling lightheadedness no real room spinning not feeling off balance.  No chest pain.  Patient discussed with her cardiologist blood pressure being high she was sent to urgent care then urgent care sent her here.  Patient does have a little bit of a posterior headache there also no nausea or vomiting.  No visual changes other than she did feel as if maybe things were graying out at times and no numbness no weakness to her extremities no speech problems.  Temp here 98.2 pulse 84 blood pressure 179/91.  Repeat was 184/86.  Past medical here significant for chronic atrial fibrillation hypertension aortic atherosclerosis hyperlipidemia chronic kidney disease stage III and COPD.  Patient is never used tobacco products.  Patient's main concern was really that the blood pressure has been high which is unusual for her.  She is not sure why.  Patient also did have some diarrhea overnight no blood in it she occasionally will have that no real vomiting no fevers but had sort of a tremor feeling.       Prior to Admission medications   Medication Sig Start Date End Date Taking? Authorizing Provider  CALCIUM  PO Take 1 tablet by mouth at bedtime.    [provider]  Cholecalciferol 25 MCG (1000 UT) tablet Take 1,000 Units by mouth daily.    [provider]  dapagliflozin  propanediol (FARXIGA ) 10 MG TABS tablet Take 1  tablet (10 mg total) by mouth daily before breakfast. 02/11/24   Goodrich, Callie E, PA-C  diltiazem  (CARDIZEM  CD) 120 MG 24 hr capsule Take 1 capsule (120 mg total) by mouth daily. 06/09/24   Goodrich, Callie E, PA-C  ELIQUIS  5 MG TABS tablet TAKE 1 TABLET TWICE DAILY 06/08/24   Pietro Redell RAMAN, MD  famotidine  (PEPCID ) 20 MG tablet TAKE 1 TABLET AT BEDTIME 12/28/23   Alvan Dorothyann BIRCH, MD  furosemide  (LASIX ) 20 MG tablet Take 1 tablet (20 mg total) by mouth daily. 06/09/24   Goodrich, Callie E, PA-C  hydroxychloroquine  (PLAQUENIL ) 200 MG tablet Take 200 mg by mouth daily. 04/01/24   [provider]  Magnesium  200 MG TABS Take 1 tablet (200 mg total) by mouth daily. 12/10/16   Dow Arland BROCKS, NP  montelukast  (SINGULAIR ) 10 MG tablet TAKE 1 TABLET(10 MG) BY MOUTH AT BEDTIME 04/04/24   Alvan Dorothyann BIRCH, MD  Multiple Vitamins-Minerals (MULTIVITAMIN PO) Take 1 tablet by mouth at bedtime.    [provider]  omeprazole  (PRILOSEC) 20 MG capsule TAKE 1 CAPSULE EVERY DAY 03/16/24   Alvan Dorothyann BIRCH, MD  potassium chloride  SA (KLOR-CON  M) 20 MEQ tablet TAKE 1 TABLET EVERY DAY 10/22/23   Alvan Dorothyann BIRCH, MD  rosuvastatin  (CRESTOR ) 20 MG tablet Take 1 tablet (20 mg total) by mouth daily. 11/30/23   Pietro Redell RAMAN, MD  SPIRIVA  RESPIMAT 1.25 MCG/ACT AERS INHALE 2 PUFFS INTO THE  LUNGS DAILY. 01/02/24 06/20/25  Mannam, Praveen, MD  Tiotropium Bromide-Olodaterol (STIOLTO RESPIMAT ) 2.5-2.5 MCG/ACT AERS Inhale 2 puffs into the lungs daily. 06/20/24   Alvan Dorothyann BIRCH, MD  traMADol  (ULTRAM ) 50 MG tablet Take 1 tablet (50 mg total) by mouth every 12 (twelve) hours as needed for moderate pain (pain score 4-6). 06/20/24   Alvan Dorothyann BIRCH, MD    Allergies: Ace inhibitors, Arnuity ellipta  [fluticasone  furoate], Atorvastatin, Simvastatin , Yellow dyes (non-tartrazine), Celecoxib, Cetirizine hcl, Penicillins, and Sulfonamide derivatives    Review of Systems  Constitutional:   Negative for chills and fever.  HENT:  Negative for ear pain and sore throat.   Eyes:  Negative for pain and visual disturbance.  Respiratory:  Negative for cough and shortness of breath.   Cardiovascular:  Negative for chest pain, palpitations and leg swelling.  Gastrointestinal:  Positive for diarrhea. Negative for abdominal pain and vomiting.  Genitourinary:  Negative for dysuria and hematuria.  Musculoskeletal:  Negative for arthralgias and back pain.  Skin:  Negative for color change and rash.  Neurological:  Positive for light-headedness and headaches. Negative for dizziness, seizures, syncope, facial asymmetry, speech difficulty, weakness and numbness.  All other systems reviewed and are negative.   Updated Vital Signs BP (!) 184/86 (BP Location: Left Arm)   Pulse 84   Temp 98.2 F (36.8 C) (Oral)   Resp 18   Ht 1.6 m (5' 3)   Wt 73.5 kg   SpO2 97%   BMI 28.70 kg/m   Physical Exam Vitals and nursing note reviewed.  Constitutional:      General: She is not in acute distress.    Appearance: Normal appearance. She is well-developed.  HENT:     Head: Normocephalic and atraumatic.     Mouth/Throat:     Mouth: Mucous membranes are moist.  Eyes:     Extraocular Movements: Extraocular movements intact.     Conjunctiva/sclera: Conjunctivae normal.     Pupils: Pupils are equal, round, and reactive to light.  Cardiovascular:     Rate and Rhythm: Normal rate and regular rhythm.     Heart sounds: No murmur heard. Pulmonary:     Effort: Pulmonary effort is normal. No respiratory distress.     Breath sounds: Normal breath sounds.  Abdominal:     Palpations: Abdomen is soft.     Tenderness: There is no abdominal tenderness.  Musculoskeletal:        General: No swelling.     Cervical back: Normal range of motion and neck supple. No rigidity.     Right lower leg: No edema.     Left lower leg: No edema.  Skin:    General: Skin is warm and dry.     Capillary Refill:  Capillary refill takes less than 2 seconds.  Neurological:     General: No focal deficit present.     Mental Status: She is alert and oriented to person, place, and time.     Cranial Nerves: No cranial nerve deficit.     Sensory: No sensory deficit.     Motor: No weakness.  Psychiatric:        Mood and Affect: Mood normal.     (all labs ordered are listed, but only abnormal results are displayed) Labs Reviewed  COMPREHENSIVE METABOLIC PANEL WITH GFR - Abnormal; Notable for the following components:      Result Value   Creatinine, Ser 1.16 (*)    GFR, Estimated 47 (*)    All other  components within normal limits  CBC  URINALYSIS, ROUTINE W REFLEX MICROSCOPIC  CBG MONITORING, ED  TROPONIN T, HIGH SENSITIVITY    EKG: EKG Interpretation Date/Time:  Friday July 01 2024 14:55:59 EDT Ventricular Rate:  82 PR Interval:  130 QRS Duration:  90 QT Interval:  404 QTC Calculation: 472 R Axis:   94  Text Interpretation: Normal sinus rhythm Rightward axis Nonspecific ST abnormality Abnormal ECG No previous ECGs available No significant change since last tracing Confirmed by My [place] [phone]) on 07/01/2024 4:25:43 PM  Radiology: No results found.   Procedures   Medications Ordered in the ED - No data to display                                  Medical Decision Making Amount and/or Complexity of Data Reviewed Labs: ordered. Radiology: ordered.  Risk OTC drugs. Prescription drug management.   Patient's EKG patient not in atrial fibrillation.  No significant change in EKG compared to old.  Complete metabolic panel GFR 47 with a creatinine 1.16 which is actually somewhat baseline for her if the little bit improved.  No electrolyte abnormalities.  CBC is normal with white count of 10 hemoglobin 13.2 platelets 265.  Urinalysis is pending.  Get chest x-ray head CT and put her on cardiac monitor.  Also will get troponins.  Initial troponin very normal less than 15.   Urinalysis here normal.  Delta troponins pending.  Urinalysis did have 6-10 white blood cells bacteria rare.  Will send for urine culture.  Complete metabolic panel normal as mentioned above GFR 47 which is pulm at baseline for her.  Head CT had some vague hypoattenuation right frontal lobe differential considerations include volume averaging or age-indeterminate infarct if there is concern for acute stroke MRI is recommended.  Chest x-ray cardiomegaly with pulmonary vascular congestion left basilar atelectasis or pneumonia.  All these potentially could explain some of her symptoms.  Will send urine culture.  Will discuss the head CT results with neurology.  Will get CT chest to further evaluate the concerns for pneumonia.  Discussed with neurology Dr. Voncile, who is recommending that she be sent in for MRI if positive give them a call for admission if negative then we will have her ready to go home.  But first here will get the CT of the chest to rule out the concerns for pneumonia.  And patient's delta troponin is still pending.  Also based on this concern blood pressure will not be addressed.  Currently systolics like low 176.  And in the face of possible stroke we will be trying to bring that down right now.  CRITICAL CARE Performed by: Jaselyn Nahm Total critical care time: 45 minutes Critical care time was exclusive of separately billable procedures and treating other patients. Critical care was necessary to treat or prevent imminent or life-threatening deterioration. Critical care was time spent personally by me on the following activities: development of treatment plan with patient and/or surrogate as well as nursing, discussions with consultants, evaluation of patient's response to treatment, examination of patient, obtaining history from patient or surrogate, ordering and performing treatments and interventions, ordering and review of laboratory studies, ordering and review of radiographic  studies, pulse oximetry and re-evaluation of patient's condition.  Delta troponin is 15 so no acute changes in that.  CT chest without evidence of pneumonia.  But there is mosaic groundglass attenuation throughout both lungs  suggestive of small airway disease and air trapping.  But no pneumonia.  There is an interval increase in density and a solid component of a nodule in the right lower lobe concerning for slow-growing tumor.  Close follow-up will be required for that.  Patient will be sent in for MRI.  ED provider accepting at [facility] for MRI is Dr. Redell Pinal.  [facility] will transfer.   Final diagnoses:  Primary hypertension  Near syncope    ED Discharge Orders     None          Geraldene Hamilton, MD 07/01/24 1717    Geraldene Hamilton, MD 07/01/24 DANIAL Geraldene Hamilton, MD 07/01/24 1911    Geraldene Hamilton, MD 07/01/24 IRVEN    Geraldene Hamilton, MD 07/01/24 2025    Geraldene Hamilton, MD 07/01/24 2034

## 2024-07-01 NOTE — ED Notes (Signed)
 Pt hooked up to the monitor with the 5 lead, bp cuff and pulse ox

## 2024-07-01 NOTE — ED Notes (Signed)
 Delta trop dropped off at lab at this time.

## 2024-07-01 NOTE — Discharge Instructions (Addendum)
 Advised patient to go to [facility] ED now for further evaluation of elevated blood pressure and near syncope.  Advised patient urinalysis within normal limits and is not dehydrated.  Advised patient to monitor blood pressure daily in the morning prior to eating breakfast.  Advised patient to log measurements for the next 10 mornings so that cardiology/PCP can better understand daily blood pressure trends.  Advised if syncopal episodes worsen please follow-up with cardiology, PCP or go to [facility] ED for further evaluation.

## 2024-07-01 NOTE — ED Notes (Signed)
 Patient transported to CT

## 2024-07-01 NOTE — ED Notes (Signed)
 [facility] called for transport.

## 2024-07-01 NOTE — Telephone Encounter (Signed)
 She had an appointment with PCP before the fourth of July- she was told by her pcp to check bp's.   Yesterday, started feeling faint/dizzy/shakey/sweaty.  Diarrhea started yesterday afternoon- she has had loose stools every few hours.   She reports that her upper body is kind of shakey about 10pm last night  145/78 hr104 - last night  168/84 hr 78 - 9:45am  178/84 hr 104 - currently  She took all her meds this morning at about 9 am, she ate a banana.   Informed her that having diarrhea can cause dehydration and electolyte imbalances. This can cause heart rate to increase and BP changes.   She reports that she just had lab work 06/29/24- provider needs to review it.   She reports that when she gets up and does anything she feels pressure in her head/pounding/ and she feels so dizzy that she feels she wants to pass out, so she has to sit down. She has not passed out, she has been laying on the couch.   Her BP went up to 180/81- but she has been talking on the phone. Informed her that the best thing to do at this time would be to go to an Urgent Care or an ER. I will send this information to her provider to see if they have any further recommendations for her. We will call only if there are further recommendations.

## 2024-07-01 NOTE — Discharge Instructions (Addendum)
 CT showed no evidence of pneumonia.  But did show concern for a right lower lobe pulmonary nodule which will need outpatient close follow-up by your providers.

## 2024-07-01 NOTE — Telephone Encounter (Signed)
   Pt c/o BP issue: STAT if pt c/o blurred vision, one-sided weakness or slurred speech.  STAT if BP is GREATER than 180/120 TODAY.  STAT if BP is LESS than 90/60 and SYMPTOMATIC TODAY  1. What is your BP concern? Pt states her bp has started to rise since last night  2. Have you taken any BP medication today? Yes   3. What are your last 5 BP readings?  145/78 hr104 - last night  168/84 hr 78 - 9:45am  178/84 hr 104 - currently  4. Are you having any other symptoms (ex. Dizziness, headache, blurred vision, passed out)? Dizziness

## 2024-07-01 NOTE — ED Notes (Signed)
 Patient is being discharged from the Urgent Care and sent to the Emergency Department via POV with her son . Per Ragan NP, patient is in need of higher level of care due to abnormal spec gravity and glucosuria. Patient is aware and verbalizes understanding of plan of care.  Vitals:   07/01/24 1256  BP: (!) 178/76  Pulse: (!) 103  Resp: 19  Temp: (!) 97.5 F (36.4 C)  SpO2: 98%

## 2024-07-01 NOTE — ED Triage Notes (Signed)
 PT transfer from [facility] for MRI

## 2024-07-01 NOTE — ED Provider Notes (Signed)
 Rebekah Harrell    CSN: [id number] Arrival date & time: 07/01/24  1243      History   Chief Complaint Chief Complaint  Patient presents with   Near Syncope   Hypertension    HPI Rebekah Harrell is a 83 y.o. female.   HPI 83 year old female presents with elevated heart rate, high blood pressure and near syncope this afternoon.  Patient reports recent constipation and change in stool.  Patient denies abdominal pain, nausea, or vomiting but endorses decreased appetite.  Patient reports she may be dehydrated.  PMH significant for chronic atrial fibrillation, lupus, COPD, and aortic atherosclerosis.  Patient is currently taking apixaban  and denies any unusual bleeding.  Past Medical History:  Diagnosis Date   Allergy     Aortic atherosclerosis (HCC)    Arthritis    lower back; knees (11/26/2016)   Asthma    Chronic atrial fibrillation (HCC)    CKD (chronic kidney disease), stage III (HCC)    patient unaware of this on 11/26/2016   Complication of anesthesia    had trouble waking me up in the late 1990's after gallbladder OR   COPD (chronic obstructive pulmonary disease) (HCC)    GERD (gastroesophageal reflux disease)    Heart murmur    dx'd when I was a little kid   History of hiatal hernia    Hyperlipidemia    Hypertension    Hypoglycemia     Patient Active Problem List   Diagnosis Date Noted   Localized swelling of right lower extremity 09/22/2023   Mass of lower leg, left 09/22/2023   Hypercoagulable state due to persistent atrial fibrillation (HCC) 06/26/2023   Tinnitus 08/27/2022   Lupus 08/27/2022   GERD (gastroesophageal reflux disease) 02/24/2022   Proteinuria 07/25/2021   Leukocytes in urine 07/25/2021   Angioedema due to angiotensin converting enzyme inhibitor (ACE-I) 07/31/2017   Urticaria, chronic 07/31/2017   Atrial fibrillation (HCC) 11/26/2016   Aortic atherosclerosis (HCC) 07/07/2016   CKD (chronic kidney disease) stage 3, GFR 30-59  ml/min (HCC) 10/29/2015   Osteoarthritis of left thumb 06/07/2014   Osteoarthritis of right thumb 06/07/2014   Arrhythmia 04/30/2011   Spondylolisthesis at L4-L5 level 04/03/2010   Osteopenia after menopause 04/03/2010   SEBORRHEIC KERATOSIS 02/18/2008   CAROTID ARTERY STENOSIS, BILATERAL 02/15/2008   COPD (chronic obstructive pulmonary disease) (HCC) 08/03/2007   IBS 08/03/2007   Hyperlipidemia 07/05/2007   HYPERTENSION, BENIGN ESSENTIAL 07/05/2007    Past Surgical History:  Procedure Laterality Date   ATRIAL FIBRILLATION ABLATION N/A 12/08/2023   Procedure: ATRIAL FIBRILLATION ABLATION;  Surgeon: Inocencio Soyla Lunger, MD;  Location: [facility] INVASIVE CV LAB;  Service: Cardiovascular;  Laterality: N/A;   CARDIOVERSION N/A 06/10/2023   Procedure: CARDIOVERSION;  Surgeon: Kate Lonni LITTIE, MD;  Location: [facility] INVASIVE CV LAB;  Service: Cardiovascular;  Laterality: N/A;   CHOLECYSTECTOMY OPEN  1990s   TONSILLECTOMY     TUBAL LIGATION      OB History     Gravida  3   Para      Term      Preterm      AB      Living  3      SAB      IAB      Ectopic      Multiple      Live Births               Home Medications    Prior to Admission medications   Medication  Sig Start Date End Date Taking? Authorizing Provider  CALCIUM  PO Take 1 tablet by mouth at bedtime.    [provider]  Cholecalciferol 25 MCG (1000 UT) tablet Take 1,000 Units by mouth daily.    [provider]  dapagliflozin  propanediol (FARXIGA ) 10 MG TABS tablet Take 1 tablet (10 mg total) by mouth daily before breakfast. 02/11/24   Goodrich, Callie E, PA-C  diltiazem  (CARDIZEM  CD) 120 MG 24 hr capsule Take 1 capsule (120 mg total) by mouth daily. 06/09/24   Goodrich, Callie E, PA-C  ELIQUIS  5 MG TABS tablet TAKE 1 TABLET TWICE DAILY 06/08/24   Pietro Redell RAMAN, MD  famotidine  (PEPCID ) 20 MG tablet TAKE 1 TABLET AT BEDTIME 12/28/23   Alvan Dorothyann BIRCH, MD  furosemide  (LASIX ) 20 MG  tablet Take 1 tablet (20 mg total) by mouth daily. 06/09/24   Goodrich, Callie E, PA-C  hydroxychloroquine  (PLAQUENIL ) 200 MG tablet Take 200 mg by mouth daily. 04/01/24   [provider]  Magnesium  200 MG TABS Take 1 tablet (200 mg total) by mouth daily. 12/10/16   Dow Arland BROCKS, NP  montelukast  (SINGULAIR ) 10 MG tablet TAKE 1 TABLET(10 MG) BY MOUTH AT BEDTIME 04/04/24   Alvan Dorothyann BIRCH, MD  Multiple Vitamins-Minerals (MULTIVITAMIN PO) Take 1 tablet by mouth at bedtime.    [provider]  omeprazole  (PRILOSEC) 20 MG capsule TAKE 1 CAPSULE EVERY DAY 03/16/24   Alvan Dorothyann BIRCH, MD  potassium chloride  SA (KLOR-CON  M) 20 MEQ tablet TAKE 1 TABLET EVERY DAY 10/22/23   Alvan Dorothyann BIRCH, MD  rosuvastatin  (CRESTOR ) 20 MG tablet Take 1 tablet (20 mg total) by mouth daily. 11/30/23   Pietro Redell RAMAN, MD  SPIRIVA  RESPIMAT 1.25 MCG/ACT AERS INHALE 2 PUFFS INTO THE LUNGS DAILY. 01/02/24 06/20/25  Mannam, Praveen, MD  Tiotropium Bromide-Olodaterol (STIOLTO RESPIMAT ) 2.5-2.5 MCG/ACT AERS Inhale 2 puffs into the lungs daily. 06/20/24   Alvan Dorothyann BIRCH, MD  traMADol  (ULTRAM ) 50 MG tablet Take 1 tablet (50 mg total) by mouth every 12 (twelve) hours as needed for moderate pain (pain score 4-6). 06/20/24   Alvan Dorothyann BIRCH, MD    Family History Family History  Problem Relation Age of Onset   CAD Mother        HEART ATTACK   Colon cancer Father        KIDNEY FAILURE/COLON CANCER   Atrial fibrillation Brother    Healthy Brother    Heart failure Maternal Grandmother    Kidney failure Maternal Grandfather    Emphysema Maternal Grandfather    Heart attack Paternal Grandmother     Social History Social History   Tobacco Use   Smoking status: Never    Passive exposure: Past   Smokeless tobacco: Never  Vaping Use   Vaping status: Never Used  Substance Use Topics   Alcohol use: Not Currently   Drug use: No     Allergies   Ace inhibitors, Arnuity ellipta   [fluticasone  furoate], Atorvastatin, Simvastatin , Yellow dyes (non-tartrazine), Celecoxib, Cetirizine hcl, Penicillins, and Sulfonamide derivatives   Review of Systems Review of Systems  Cardiovascular:        Syncopal episodes since this morning with elevated blood pressure reading  All other systems reviewed and are negative.    Physical Exam Triage Vital Signs ED Triage Vitals  Encounter Vitals Group     BP 07/01/24 1256 (!) 178/76     Girls Systolic BP Percentile --      Girls Diastolic BP Percentile --  Boys Systolic BP Percentile --      Boys Diastolic BP Percentile --      Pulse Rate 07/01/24 1256 (!) 103     Resp 07/01/24 1256 19     Temp 07/01/24 1256 (!) 97.5 F (36.4 C)     Temp src --      SpO2 07/01/24 1256 98 %     Weight --      Height --      Head Circumference --      Peak Flow --      Pain Score 07/01/24 1254 0     Pain Loc --      Pain Education --      Exclude from Growth Chart --    No data found.  Updated Vital Signs BP (!) 178/76   Pulse (!) 103   Temp (!) 97.5 F (36.4 C)   Resp 19   SpO2 98%    Physical Exam Vitals and nursing note reviewed.  Constitutional:      Appearance: Normal appearance. She is normal weight.  HENT:     Head: Normocephalic and atraumatic.     Mouth/Throat:     Mouth: Mucous membranes are moist.     Pharynx: Oropharynx is clear.  Eyes:     Extraocular Movements: Extraocular movements intact.     Conjunctiva/sclera: Conjunctivae normal.     Pupils: Pupils are equal, round, and reactive to light.  Cardiovascular:     Rate and Rhythm: Normal rate and regular rhythm.     Pulses: Normal pulses.     Heart sounds: Normal heart sounds.  Pulmonary:     Effort: Pulmonary effort is normal.     Breath sounds: Normal breath sounds. No wheezing, rhonchi or rales.  Musculoskeletal:        General: Normal range of motion.     Cervical back: Normal range of motion and neck supple.  Skin:    General: Skin is warm  and dry.  Neurological:     General: No focal deficit present.     Mental Status: She is alert and oriented to person, place, and time. Mental status is at baseline.  Psychiatric:        Mood and Affect: Mood normal.        Behavior: Behavior normal.      UC Treatments / Results  Labs (all labs ordered are listed, but only abnormal results are displayed) Labs Reviewed  POCT URINALYSIS DIP (MANUAL ENTRY) - Abnormal; Notable for the following components:      Result Value   Glucose, UA =500 (*)    Spec Grav, UA <=1.005 (*)    All other components within normal limits  POCT FASTING CBG [facility] MANUAL ENTRY - Abnormal; Notable for the following components:   POCT Glucose ([facility]) 129 (*)    All other components within normal limits  POCT RAPID STREP A (OFFICE)    EKG   Radiology No results found.  Procedures Procedures (including critical Harrell time)  Medications Ordered in UC Medications - No data to display  Initial Impression / Assessment and Plan / UC Course  I have reviewed the triage vital signs and the nursing notes.  Pertinent labs & imaging results that were available during my Harrell of the patient were reviewed by me and considered in my medical decision making (see chart for details).     MDM: 1.  Near syncope-UA reveals glucosuria 500 CBG 123 in clinic, advised patient to  go to med [facility] ED for further evaluation.  Patient and son agreed and verbalized understanding of these instructions and this plan of Harrell today. Advised patient/son to go to Med [facility] ED now for further evaluation of elevated blood pressure and near syncope.  Advised patient urinalysis within normal limits and is not dehydrated.  2.  Hypertension, unspecified type- advised patient to monitor blood pressure daily in the morning prior to eating breakfast.  Advised patient to log measurements for the next 10 mornings so that cardiology/PCP can better understand daily blood pressure  trends.  Advised if syncopal episodes worsen please follow-up with cardiology, PCP or go to [facility] ED for further evaluation.  Patient discharged home, hemodynamically stable. Final Clinical Impressions(s) / UC Diagnoses   Final diagnoses:  Near syncope  Hypertension, unspecified type     Discharge Instructions      Advised patient to go to Med [facility] ED now for further evaluation of elevated blood pressure and near syncope.  Advised patient urinalysis within normal limits and is not dehydrated.  Advised patient to monitor blood pressure daily in the morning prior to eating breakfast.  Advised patient to log measurements for the next 10 mornings so that cardiology/PCP can better understand daily blood pressure trends.  Advised if syncopal episodes worsen please follow-up with cardiology, PCP or go to [facility] ED for further evaluation.     ED Prescriptions   None    PDMP not reviewed this encounter.   Teddy Sharper, FNP 07/01/24 1357

## 2024-07-02 ENCOUNTER — Emergency Department (HOSPITAL_COMMUNITY)

## 2024-07-02 ENCOUNTER — Ambulatory Visit: Payer: Self-pay | Admitting: Student

## 2024-07-02 DIAGNOSIS — I6782 Cerebral ischemia: Secondary | ICD-10-CM | POA: Diagnosis not present

## 2024-07-02 MED ORDER — PROCHLORPERAZINE EDISYLATE 10 MG/2ML IJ SOLN
5.0000 mg | Freq: Once | INTRAMUSCULAR | Status: AC
  Administered 2024-07-02: 5 mg via INTRAVENOUS
  Filled 2024-07-02: qty 2

## 2024-07-02 MED ORDER — DIPHENHYDRAMINE HCL 50 MG/ML IJ SOLN
12.5000 mg | Freq: Once | INTRAMUSCULAR | Status: AC
  Administered 2024-07-02: 12.5 mg via INTRAVENOUS
  Filled 2024-07-02: qty 1

## 2024-07-02 NOTE — ED Provider Notes (Signed)
 I assumed care of this patient from previous provider.  Please see their note for further details of history, exam, and MDM.   Briefly patient is a 83 y.o. female who presented transferred from [facility] to obtain an MRI rule out stroke for dizziness.  Patient is hemodynamically stable.  Was unable to do the MRI without medicine. Ordered nausea medicine and Benadryl .  MRI negative.  Patient was able to ambulate without complication.  The patient appears reasonably screened and/or stabilized for discharge and I doubt any other medical condition or other [facility] requiring further screening, evaluation, or treatment in the ED at this time. I have discussed the findings, Dx and Tx plan with the patient/family who expressed understanding and agree(s) with the plan. Discharge instructions discussed at length. The patient/family was given strict return precautions who verbalized understanding of the instructions. No further questions at time of discharge.  Disposition: Discharge  Condition: Good  ED Discharge Orders     None         Follow Up: Alvan Dorothyann BIRCH, MD [address] [place] [address] [phone]  Call  to schedule an appointment for close follow up         Trine Raynell Moder, MD 07/02/24 [phone]

## 2024-07-03 ENCOUNTER — Other Ambulatory Visit: Payer: Self-pay | Admitting: Family Medicine

## 2024-07-03 LAB — URINE CULTURE

## 2024-07-04 NOTE — Telephone Encounter (Signed)
 Call to patient regarding concerns about elevated BP and recent ED visit. Offered DOD appt 7/16 at 1:30 pm. Patient accepting, discussed to go to ED if elevated BP returns, especially if accompanied by weakness, blurry vision or headache. Patient verbalizes understanding.

## 2024-07-06 ENCOUNTER — Ambulatory Visit: Attending: Cardiology | Admitting: Cardiovascular Disease

## 2024-07-06 ENCOUNTER — Encounter: Payer: Self-pay | Admitting: Cardiovascular Disease

## 2024-07-06 ENCOUNTER — Encounter: Payer: Self-pay | Admitting: Family Medicine

## 2024-07-06 VITALS — BP 158/72 | HR 75 | Ht 63.0 in | Wt 161.6 lb

## 2024-07-06 DIAGNOSIS — I10 Essential (primary) hypertension: Secondary | ICD-10-CM | POA: Diagnosis not present

## 2024-07-06 DIAGNOSIS — R0989 Other specified symptoms and signs involving the circulatory and respiratory systems: Secondary | ICD-10-CM

## 2024-07-06 NOTE — Patient Instructions (Signed)
 Medication Instructions:  Your physician recommends that you continue on your current medications as directed. Please refer to the Current Medication list given to you today.  *If you need a refill on your cardiac medications before your next appointment, please call your pharmacy*   Testing/Procedures: Your physician has requested that you have a carotid duplex. This test is an ultrasound of the carotid arteries in your neck. It looks at blood flow through these arteries that supply the brain with blood. Allow one hour for this exam. There are no restrictions or special instructions. This will take place at [address] 4th floor  Please note: We ask at that you not bring children with you during ultrasound (echo/ vascular) testing. Due to room size and safety concerns, children are not allowed in the ultrasound rooms during exams. Our front office staff cannot provide observation of children in our lobby area while testing is being conducted. An adult accompanying a patient to their appointment will only be allowed in the ultrasound room at the discretion of the ultrasound technician under special circumstances. We apologize for any inconvenience.   Follow-Up: At [facility], you and your health needs are our priority.  As part of our continuing mission to provide you with exceptional heart care, our providers are all part of one team.  This team includes your primary Cardiologist (physician) and Advanced Practice Providers or APPs (Physician Assistants and Nurse Practitioners) who all work together to provide you with the care you need, when you need it.  Your next appointment:   3 month(s)  Provider:   Callie Goodrich, PA-C          We recommend signing up for the patient portal called MyChart.  Sign up information is provided on this After Visit Summary.  MyChart is used to connect with patients for Virtual Visits (Telemedicine).  Patients are able to view lab/test  results, encounter notes, upcoming appointments, etc.  Non-urgent messages can be sent to your provider as well.   To learn more about what you can do with MyChart, go to [email or web address].   Other Instructions Dr. Court has requested that you schedule an appointment with one of our clinical pharmacists for a blood pressure check appointment within the next 6-8 weeks.  If you monitor your blood pressure (BP) at home, please bring your BP cuff and your BP readings with you to this appointment  HOW TO TAKE YOUR BLOOD PRESSURE: Rest 5 minutes before taking your blood pressure. Don't smoke or drink caffeinated beverages for at least 30 minutes before. Take your blood pressure before (not after) you eat. Sit comfortably with your back supported and both feet on the floor (don't cross your legs). Elevate your arm to heart level on a table or a desk. Use the proper sized cuff. It should fit smoothly and snugly around your bare upper arm. There should be enough room to slip a fingertip under the cuff. The bottom edge of the cuff should be 1 inch above the crease of the elbow. Ideally, take 3 measurements at one sitting and record the average.

## 2024-07-06 NOTE — Assessment & Plan Note (Signed)
 History of essential hypertension with blood pressure measured today at 158/72.  She is being seen as an add-on because of a recent ER visit on 07/01/2024 when her blood pressure was measured at 184/86 for unclear reasons.  She had recently seen Callie Goodrich, PA-C who altered her medications and placed her on low-dose Cardizem .  She checks her blood pressures at home and for the most part they are higher than they have been in the past for unclear reasons.  I am going to have her keep a 30-day blood pressure log and see a Pharm.D. back in 4 to 6 weeks to review make appropriate changes.

## 2024-07-06 NOTE — Progress Notes (Signed)
 07/06/2024 Rebekah Harrell   [birth year]  [id number]  Primary Physician Alvan Dorothyann BIRCH, MD Primary Cardiologist: Dorn JINNY Lesches MD GENI CODY MADEIRA, FSCAI  HPI:  Rebekah Harrell is a 83 y.o. mildly overweight divorced Caucasian female mother of 3, grandmother of 4 grandchildren patient of Dr. Vertie whom she has not seen in several years who I am seeing as an add-on for DOD because of recent ER visit for hypertension.  She has a history of treated hypertension hyperlipidemia.  She is never had a heart attack or stroke.  She does have a long history of A-fib status post ablation by Dr. Inocencio 12/08/2023 maintaining sinus rhythm on Eliquis  oral anticoagulation.  She does have a history of moderate MR.  She was seen in the ER 07/01/2024 with elevated blood pressure at 184/86 for unclear reasons.  Workup was unrevealing.  She does get short of breath but does have reactive airways disease.  She denies chest pain.   Current Meds  Medication Sig   CALCIUM  PO Take 1 tablet by mouth at bedtime.   Cholecalciferol 25 MCG (1000 UT) tablet Take 1,000 Units by mouth daily.   dapagliflozin  propanediol (FARXIGA ) 10 MG TABS tablet Take 1 tablet (10 mg total) by mouth daily before breakfast.   diltiazem  (CARDIZEM  CD) 120 MG 24 hr capsule Take 1 capsule (120 mg total) by mouth daily.   ELIQUIS  5 MG TABS tablet TAKE 1 TABLET TWICE DAILY   famotidine  (PEPCID ) 20 MG tablet TAKE 1 TABLET AT BEDTIME   furosemide  (LASIX ) 20 MG tablet Take 1 tablet (20 mg total) by mouth daily.   Magnesium  200 MG TABS Take 1 tablet (200 mg total) by mouth daily.   montelukast  (SINGULAIR ) 10 MG tablet TAKE 1 TABLET(10 MG) BY MOUTH AT BEDTIME   Multiple Vitamins-Minerals (MULTIVITAMIN PO) Take 1 tablet by mouth at bedtime.   omeprazole  (PRILOSEC) 20 MG capsule TAKE 1 CAPSULE EVERY DAY   potassium chloride  SA (KLOR-CON  M) 20 MEQ tablet TAKE 1 TABLET EVERY DAY   rosuvastatin  (CRESTOR ) 20 MG tablet Take 1 tablet (20  mg total) by mouth daily.   Tiotropium Bromide-Olodaterol (STIOLTO RESPIMAT ) 2.5-2.5 MCG/ACT AERS Inhale 2 puffs into the lungs daily.   traMADol  (ULTRAM ) 50 MG tablet Take 1 tablet (50 mg total) by mouth every 12 (twelve) hours as needed for moderate pain (pain score 4-6).     Allergies  Allergen Reactions   Ace Inhibitors Swelling   Arnuity Ellipta  [Fluticasone  Furoate] Other (See Comments)    Voice hoarseness   Atorvastatin Other (See Comments)    Myalgias   Simvastatin      Myalgia    Yellow Dyes (Non-Tartrazine) Other (See Comments)    Allergy  test   Celecoxib Other (See Comments)    hair loss   Cetirizine Hcl Other (See Comments)    fatigue   Penicillins Other (See Comments)    Unknown childhood reaction    Sulfonamide Derivatives Rash    Social History   Socioeconomic History   Marital status: Divorced    Spouse name: Not on file   Number of children: 3   Years of education: 14   Highest education level: Some college, no degree  Occupational History   Occupation: stages houses for sale    Comment: self employed   Occupation: Retired.  Tobacco Use   Smoking status: Never    Passive exposure: Past   Smokeless tobacco: Never  Vaping Use   Vaping status: Never Used  Substance and Sexual Activity   Alcohol use: Not Currently   Drug use: No   Sexual activity: Not Currently  Other Topics Concern   Not on file  Social History Narrative   Lives alone. One son lives close by incase she needs anything. She enjoys crafting and decorating.    Social Drivers of Health   Financial Resource Strain: Medium Risk (06/16/2024)   Overall Financial Resource Strain (CARDIA)    Difficulty of Paying Living Expenses: Somewhat hard  Food Insecurity: Patient Declined (06/16/2024)   Hunger Vital Sign    Worried About Running Out of Food in the Last Year: Patient declined    Ran Out of Food in the Last Year: Patient declined  Transportation Needs: No Transportation Needs  (06/16/2024)   PRAPARE - Administrator, Civil Service (Medical): No    Lack of Transportation (Non-Medical): No  Physical Activity: Insufficiently Active (06/16/2024)   Exercise Vital Sign    Days of Exercise per Week: 1 day    Minutes of Exercise per Session: 60 min  Stress: Stress Concern Present (06/16/2024)   [employer] of Occupational Health - Occupational Stress Questionnaire    Feeling of Stress: To some extent  Social Connections: Socially Isolated (06/16/2024)   Social Connection and Isolation Panel    Frequency of Communication with Friends and Family: More than three times a week    Frequency of Social Gatherings with Friends and Family: Once a week    Attends Religious Services: Never    Database administrator or Organizations: No    Attends Engineer, structural: Not on file    Marital Status: Divorced  Intimate Partner Violence: Not At Risk (01/25/2024)   Humiliation, Afraid, Rape, and Kick questionnaire    Fear of Current or Ex-Partner: No    Emotionally Abused: No    Physically Abused: No    Sexually Abused: No     Review of Systems: General: negative for chills, fever, night sweats or weight changes.  Cardiovascular: negative for chest pain, dyspnea on exertion, edema, orthopnea, palpitations, paroxysmal nocturnal dyspnea or shortness of breath Dermatological: negative for rash Respiratory: negative for cough or wheezing Urologic: negative for hematuria Abdominal: negative for nausea, vomiting, diarrhea, bright red blood per rectum, melena, or hematemesis Neurologic: negative for visual changes, syncope, or dizziness All other systems reviewed and are otherwise negative except as noted above.    Blood pressure (!) 158/72, pulse 75, height 5' 3 (1.6 m), weight 161 lb 9.6 oz (73.3 kg), SpO2 95%.  General appearance: alert and no distress Neck: no adenopathy, no JVD, supple, symmetrical, trachea midline, thyroid  not enlarged, symmetric, no  tenderness/mass/nodules, and bilateral carotid bruits Lungs: clear to auscultation bilaterally Heart: Soft outflow tract murmur Extremities: extremities normal, atraumatic, no cyanosis or edema Pulses: 2+ and symmetric Skin: Skin color, texture, turgor normal. No rashes or lesions Neurologic: Grossly normal  EKG not performed today      ASSESSMENT AND PLAN:   HYPERTENSION, BENIGN ESSENTIAL History of essential hypertension with blood pressure measured today at 158/72.  She is being seen as an add-on because of a recent ER visit on 07/01/2024 when her blood pressure was measured at 184/86 for unclear reasons.  She had recently seen Callie Goodrich, PA-C who altered her medications and placed her on low-dose Cardizem .  She checks her blood pressures at home and for the most part they are higher than they have been in the past for unclear reasons.  I  am going to have her keep a 30-day blood pressure log and see a Pharm.D. back in 4 to 6 weeks to review make appropriate changes.     Dorn DOROTHA Lesches MD FACP,FACC,FAHA, [facility] 07/06/2024 2:03 PM

## 2024-07-08 NOTE — Telephone Encounter (Signed)
 Faxed order

## 2024-07-08 NOTE — Telephone Encounter (Signed)
 Dopplers printed.  Lets fax to attention Dr. Wadie per her note below.

## 2024-07-11 ENCOUNTER — Ambulatory Visit: Payer: Self-pay | Admitting: Cardiovascular Disease

## 2024-07-11 ENCOUNTER — Other Ambulatory Visit (HOSPITAL_COMMUNITY): Payer: Self-pay

## 2024-07-11 ENCOUNTER — Ambulatory Visit (HOSPITAL_COMMUNITY)
Admission: RE | Admit: 2024-07-11 | Discharge: 2024-07-11 | Disposition: A | Discharge status: Home / Self Care | Source: Ambulatory Visit | Attending: Cardiovascular Disease | Admitting: Cardiovascular Disease

## 2024-07-11 DIAGNOSIS — R0989 Other specified symptoms and signs involving the circulatory and respiratory systems: Secondary | ICD-10-CM | POA: Insufficient documentation

## 2024-07-11 NOTE — Telephone Encounter (Signed)
 Spoke with pt, Aware of dr vertie recommendations. She reports here blood pressure has been better, 128/71, 129/57, 140/68, 123/61, and 130/61, she would like to wait and see how things go before changing medications. Patient voiced understanding of when to call.

## 2024-07-13 ENCOUNTER — Other Ambulatory Visit: Payer: Self-pay | Admitting: *Deleted

## 2024-07-13 MED ORDER — MONTELUKAST SODIUM 10 MG PO TABS: 10.0000 mg | ORAL_TABLET | Freq: Every day | ORAL | 3 refills | Status: AC

## 2024-07-15 ENCOUNTER — Ambulatory Visit: Admitting: Pulmonary Disease

## 2024-07-20 DIAGNOSIS — J342 Deviated nasal septum: Secondary | ICD-10-CM | POA: Diagnosis not present

## 2024-07-20 DIAGNOSIS — J3489 Other specified disorders of nose and nasal sinuses: Secondary | ICD-10-CM | POA: Diagnosis not present

## 2024-08-10 ENCOUNTER — Other Ambulatory Visit: Payer: Self-pay | Admitting: Family Medicine

## 2024-08-13 ENCOUNTER — Encounter: Payer: Self-pay | Admitting: Family Medicine

## 2024-08-13 DIAGNOSIS — M4316 Spondylolisthesis, lumbar region: Secondary | ICD-10-CM

## 2024-08-15 MED ORDER — TRAMADOL HCL 50 MG PO TABS
50.0000 mg | ORAL_TABLET | Freq: Two times a day (BID) | ORAL | 2 refills | Status: AC | PRN
Start: 2024-08-15 — End: ?

## 2024-08-23 ENCOUNTER — Encounter: Payer: Self-pay | Admitting: Sports Medicine

## 2024-08-24 ENCOUNTER — Ambulatory Visit: Attending: Cardiovascular Disease | Admitting: Pharmacist Clinician (PhC)/ Clinical Pharmacy Specialist

## 2024-08-24 ENCOUNTER — Encounter: Payer: Self-pay | Admitting: Pharmacist Clinician (PhC)/ Clinical Pharmacy Specialist

## 2024-08-24 ENCOUNTER — Other Ambulatory Visit (HOSPITAL_COMMUNITY): Payer: Self-pay

## 2024-08-24 DIAGNOSIS — I10 Essential (primary) hypertension: Secondary | ICD-10-CM

## 2024-08-24 NOTE — Patient Instructions (Signed)
 Follow up appointment: October 23 with Rebekah Harrell  Take your BP meds as follows:  Continue with the diltiazem  once daily  Check your blood pressure at home daily (if able) and keep record of the readings.  Your blood pressure goal is < 130/80  To check your pressure at home you will need to:  1. Sit up in a chair, with feet flat on the floor and back supported. Do not cross your ankles or legs. 2. Rest your left arm so that the cuff is about heart level. If the cuff goes on your upper arm,  then just relax the arm on the table, arm of the chair or your lap. If you have a wrist cuff, we  suggest relaxing your wrist against your chest (think of it as Pledging the Flag with the  wrong arm).  3. Place the cuff snugly around your arm, about 1 inch above the crook of your elbow. The  cords should be inside the groove of your elbow.  4. Sit quietly, with the cuff in place, for about 5 minutes. After that 5 minutes press the power  button to start a reading. 5. Do not talk or move while the reading is taking place.  6. Record your readings on a sheet of paper. Although most cuffs have a memory, it is often  easier to see a pattern developing when the numbers are all in front of you.  7. You can repeat the reading after 1-3 minutes if it is recommended  Make sure your bladder is empty and you have not had caffeine or tobacco within the last 30 min  Always bring your blood pressure log with you to your appointments. If you have not brought your monitor in to be double checked for accuracy, please bring it to your next appointment.  You can find a list of quality blood pressure cuffs at [email or web address]  Important lifestyle changes to control high blood pressure  Intervention  Effect on the BP  Lose extra pounds and watch your waistline Weight loss is one of the most effective lifestyle changes for controlling blood pressure. If you're overweight or obese, losing even a small amount of weight can  help reduce blood pressure. Blood pressure might go down by about 1 millimeter of mercury (mm Hg) with each kilogram (about 2.2 pounds) of weight lost.  Exercise regularly As a general goal, aim for at least 30 minutes of moderate physical activity every day. Regular physical activity can lower high blood pressure by about 5 to 8 mm Hg.  Eat a healthy diet Eating a diet rich in whole grains, fruits, vegetables, and low-fat dairy products and low in saturated fat and cholesterol. A healthy diet can lower high blood pressure by up to 11 mm Hg.  Reduce salt (sodium) in your diet Even a small reduction of sodium in the diet can improve heart health and reduce high blood pressure by about 5 to 6 mm Hg.  Limit alcohol One drink equals 12 ounces of beer, 5 ounces of wine, or 1.5 ounces of 80-proof liquor.  Limiting alcohol to less than one drink a day for women or two drinks a day for men can help lower blood pressure by about 4 mm Hg.   If you have any questions or concerns please use My Chart to send questions or call the office at [phone]

## 2024-08-24 NOTE — Assessment & Plan Note (Addendum)
 Assessment: BP is controlled in office BP 116/66 mmHg;  Home average 124 systolic in past 3 weeks Tolerates diltiazem  well, without any side effects Denies SOB, palpitation, chest pain, headaches,or swelling Reiterated the importance of regular exercise and low salt diet   Plan:  Continue taking diltiazem  120 mg daily Patient to keep record of BP readings with heart rate and report to us  at the next visit Patient to follow up with Callie in October  Labs ordered today:  none

## 2024-08-24 NOTE — Progress Notes (Signed)
 Office Visit    Patient Name: Rebekah Harrell Date of Encounter: 08/24/2024  Primary Care Provider:  Alvan Dorothyann BIRCH, MD Primary Cardiologist:  Redell Shallow, MD  Chief Complaint    Hypertension  Significant Past Medical History   AF Persistent, s/p ablation 12/24, CHADS2-VASc = 5, on diltiazem  120 daily  CHF Chronic diastolic (grade 2) dysfunction  HLD 7/25 LDL 56 on rosuvastatin  20  CKD Stage 3, GFR 47 (7/25)    Allergies  Allergen Reactions   Ace Inhibitors Swelling   Arnuity Ellipta  [Fluticasone  Furoate] Other (See Comments)    Voice hoarseness   Atorvastatin Other (See Comments)    Myalgias   Simvastatin      Myalgia    Yellow Dyes (Non-Tartrazine) Other (See Comments)    Allergy  test   Celecoxib Other (See Comments)    hair loss   Cetirizine Hcl Other (See Comments)    fatigue   Penicillins Other (See Comments)    Unknown childhood reaction    Sulfonamide Derivatives Rash    History of Present Illness    Rebekah Harrell is a 83 y.o. female patient of Dr Shallow, in the office today for hypertension evaluation.   Rebekah Harrell was seen in June by Callie Goodrich, with a BP of 132/78.  Because of fatigue related to metoprolol , she was switched to diltiazem  (for AF).  In July she presented to the ED with a BP of 184/86.  No medications were added, and she was seen in follow up by Dr. Court (DOD) within a week.  Pressure still elevated at 158/72.  He asked that she keep a BP log and see PharmD in 4-6 weeks.  She is in the office today.  Has been checking her BP 2-3 times per day, see below.  No concerns with diltiazem  other than getting product without yellow dye.   Is currently getting at [facility], encouraged her to use delivery service as sites are not convenient to home   Blood Pressure Goal:  130/80  Current Medications:  diltiazem  120 mg daily,   Previously tried:  metoprolol  - fatigue  Family Hx:  mother with MI, brother with AF  Social Hx:       Tobacco: no  Alcohol: no   Diet:   feta cheese omelet or yogurt for breakfast; lots of lunch meat sandwiches , grilled cheese, pb&j; rotissire chicken [employer]al[employer] choice dinners; protein shake (w/ milk and ice cream)  Exercise: none  Home BP readings:   has 28 readings over past 3 weeks, only 6have systolic > 130, highest 141.  Diastolic all WNL.  (Range 103-141/52-67)   Accessory Clinical Findings    Lab Results  Component Value Date   CREATININE 1.16 (H) 07/01/2024   BUN 9 07/01/2024   NA 142 07/01/2024   K 3.7 07/01/2024   CL 105 07/01/2024   CO2 23 07/01/2024   Lab Results  Component Value Date   ALT 8 07/01/2024   AST 21 07/01/2024   ALKPHOS 99 07/01/2024   BILITOT 0.6 07/01/2024   Lab Results  Component Value Date   HGBA1C 5.8 (H) 06/19/2016    Home Medications    Current Outpatient Medications  Medication Sig Dispense Refill   CALCIUM  PO Take 1 tablet by mouth at bedtime.     Cholecalciferol 25 MCG (1000 UT) tablet Take 1,000 Units by mouth daily.     dapagliflozin  propanediol (FARXIGA ) 10 MG TABS tablet Take 1 tablet (10 mg total) by mouth daily  before breakfast. 90 tablet 3   diltiazem  (CARDIZEM  CD) 120 MG 24 hr capsule Take 1 capsule (120 mg total) by mouth daily. 90 capsule 3   ELIQUIS  5 MG TABS tablet TAKE 1 TABLET TWICE DAILY 180 tablet 3   famotidine  (PEPCID ) 20 MG tablet TAKE 1 TABLET AT BEDTIME 90 tablet 3   furosemide  (LASIX ) 20 MG tablet Take 1 tablet (20 mg total) by mouth daily.     hydroxychloroquine  (PLAQUENIL ) 200 MG tablet Take 200 mg by mouth daily. (Patient not taking: Reported on 07/06/2024)     Magnesium  200 MG TABS Take 1 tablet (200 mg total) by mouth daily. 60 each    montelukast  (SINGULAIR ) 10 MG tablet Take 1 tablet (10 mg total) by mouth at bedtime. 90 tablet 3   Multiple Vitamins-Minerals (MULTIVITAMIN PO) Take 1 tablet by mouth at bedtime.     omeprazole  (PRILOSEC) 20 MG capsule TAKE 1 CAPSULE EVERY DAY 90 capsule 3   potassium  chloride SA (KLOR-CON  M) 20 MEQ tablet TAKE 1 TABLET EVERY DAY 90 tablet 3   rosuvastatin  (CRESTOR ) 20 MG tablet Take 1 tablet (20 mg total) by mouth daily. 90 tablet 3   SPIRIVA  RESPIMAT 1.25 MCG/ACT AERS INHALE 2 PUFFS INTO THE LUNGS DAILY. (Patient not taking: Reported on 07/06/2024) 12 g 3   Tiotropium Bromide-Olodaterol (STIOLTO RESPIMAT ) 2.5-2.5 MCG/ACT AERS Inhale 2 puffs into the lungs daily. 12 g 1   traMADol  (ULTRAM ) 50 MG tablet Take 1 tablet (50 mg total) by mouth every 12 (twelve) hours as needed for moderate pain (pain score 4-6). 60 tablet 2   No current facility-administered medications for this visit.       Assessment & Plan    HYPERTENSION, BENIGN ESSENTIAL Assessment: BP is controlled in office BP 116/66 mmHg;  Home average 124 systolic in past 3 weeks Tolerates diltiazem  well, without any side effects Denies SOB, palpitation, chest pain, headaches,or swelling Reiterated the importance of regular exercise and low salt diet   Plan:  Continue taking diltiazem  120 mg daily Patient to keep record of BP readings with heart rate and report to us  at the next visit Patient to follow up with Callie in October  Labs ordered today:  none   Allean Mink PharmD CPP [facility]  [address] [place], [place] [address] [phone]

## 2024-08-25 ENCOUNTER — Encounter: Payer: Self-pay | Admitting: Pulmonary Disease

## 2024-09-01 ENCOUNTER — Ambulatory Visit: Admitting: Pulmonary Disease

## 2024-09-17 ENCOUNTER — Other Ambulatory Visit: Payer: Self-pay | Admitting: Cardiology

## 2024-09-17 DIAGNOSIS — E785 Hyperlipidemia, unspecified: Secondary | ICD-10-CM

## 2024-09-28 ENCOUNTER — Encounter: Payer: Self-pay | Admitting: Pulmonary Disease

## 2024-09-28 ENCOUNTER — Ambulatory Visit: Admitting: Pulmonary Disease

## 2024-09-28 VITALS — BP 124/75 | HR 67 | Temp 98.2°F | Ht 63.0 in | Wt 160.4 lb

## 2024-09-28 DIAGNOSIS — R911 Solitary pulmonary nodule: Secondary | ICD-10-CM

## 2024-09-28 NOTE — Patient Instructions (Signed)
 Nice to see you again  Continue Stiolto 2 puffs once a day  I ordered a PET scan to keep an eye on the small nodule that has showed some changes over the last few months.  If this is concerning and we need to do a biopsy I can help arrange that in the coming weeks.  Possibly even before next follow-up visit if we need to.  I will be in touch after the results.  Return clinic in 6 weeks with Dr. Annella or sooner as needed

## 2024-09-28 NOTE — Progress Notes (Addendum)
 Synopsis: Referred for COPD by Alvan Dorothyann BIRCH, *  Subjective:   PATIENT ID: Rebekah Harrell GENDER: female DOB: 03-Sep-1941, MRN: [id number]  No chief complaint on file.  83 y.o. with history of allergy , asthma, COPD, AF, CKD, GERD, hiatal hernia, +ANA with recurrent angioedema of unclear etiology on plaquenil  followed by [facility], referred for COPD and worsening DOE whom we are seeing for follow-up.  Most recent PCP note reviewed.  Multiple ED notes reviewed.  Returns to clinic for routine evaluation.  Inhaler was switched from Spiriva  to Stiolto.  Historically we have been avoiding LABA therapy given appearance today for atrial fibrillation.  No description as to why this change was made via PCP note.  She is unsure if it is any better.  Does not seem worse.  In terms of dyspnea.  She had a fall.  Broke her nose.  Went to the ED 06/2024 or some concern for TIA.  Part of the workup was a CT scan that showed that the ground glass opacity right lower lobe stable in size but slight increase in solid component in the center.  We discussed options for further evaluation agree on PET scan moving forward.  HPI initial visit: She says she's had breathing problems ever since she was little, attributes it at least in part to secondhand smoke exposure in past.   Now has DOE where she has stop speaking to catch her breath if she's in conversation however she is able to relate a good history today without interruption. DOE to 100 feet. She has no cough. No orthopnea. Weight has gone up recently. She was on course of prednisone  for 10 days over January prescribed by rheumatologist but she noticed no effect on her breathing. Very rare sensation of dysphagia - doesn't frequently get choked on food/liquid. She snores once in a while. No PND. She is sleepy during the day but attributes it to her antihistamine regimen.   She didn't tolerate wixela well due to the dry powder formulation causing cough.    She sees [facility] in [place] sees Dr. Scannell. On a bunch of antihistamines due to recurrent angioedema of unclear etiology. Has not been on AIT  No family history of lung disease  She worked staging houses in past, Oceanographer in past. Lots of secondhand smoke exposure but she never smoked herself, no vaping/MJ. Has a dog and cat at home.     Past Medical History:  Diagnosis Date   Allergy     Aortic atherosclerosis    Arthritis    lower back; knees (11/26/2016)   Asthma    Chronic atrial fibrillation (HCC)    CKD (chronic kidney disease), stage III (HCC)    patient unaware of this on 11/26/2016   Complication of anesthesia    had trouble waking me up in the late 1990's after gallbladder OR   COPD (chronic obstructive pulmonary disease) (HCC)    GERD (gastroesophageal reflux disease)    Heart murmur    dx'd when I was a little kid   History of hiatal hernia    Hyperlipidemia    Hypertension    Hypoglycemia      Family History  Problem Relation Age of Onset   CAD Mother        HEART ATTACK   Colon cancer Father        KIDNEY FAILURE/COLON CANCER   Atrial fibrillation Brother    Healthy Brother    Heart failure Maternal  Grandmother    Kidney failure Maternal Grandfather    Emphysema Maternal Grandfather    Heart attack Paternal Grandmother      Past Surgical History:  Procedure Laterality Date   ATRIAL FIBRILLATION ABLATION N/A 12/08/2023   Procedure: ATRIAL FIBRILLATION ABLATION;  Surgeon: Inocencio Soyla Lunger, MD;  Location: [facility] INVASIVE CV LAB;  Service: Cardiovascular;  Laterality: N/A;   CARDIOVERSION N/A 06/10/2023   Procedure: CARDIOVERSION;  Surgeon: Kate Lonni CROME, MD;  Location: [facility] INVASIVE CV LAB;  Service: Cardiovascular;  Laterality: N/A;   CHOLECYSTECTOMY OPEN  1990s   TONSILLECTOMY     TUBAL LIGATION      Social History   Socioeconomic History   Marital status: Divorced    Spouse name: Not on  file   Number of children: 3   Years of education: 14   Highest education level: Some college, no degree  Occupational History   Occupation: stages houses for sale    Comment: self employed   Occupation: Retired.  Tobacco Use   Smoking status: Never    Passive exposure: Past   Smokeless tobacco: Never  Vaping Use   Vaping status: Never Used  Substance and Sexual Activity   Alcohol use: Not Currently   Drug use: No   Sexual activity: Not Currently  Other Topics Concern   Not on file  Social History Narrative   Lives alone. One son lives close by incase she needs anything. She enjoys crafting and decorating.    Social Drivers of Health   Financial Resource Strain: Medium Risk (06/16/2024)   Overall Financial Resource Strain (CARDIA)    Difficulty of Paying Living Expenses: Somewhat hard  Food Insecurity: Patient Declined (06/16/2024)   Hunger Vital Sign    Worried About Running Out of Food in the Last Year: Patient declined    Ran Out of Food in the Last Year: Patient declined  Transportation Needs: No Transportation Needs (06/16/2024)   PRAPARE - Administrator, Civil Service (Medical): No    Lack of Transportation (Non-Medical): No  Physical Activity: Insufficiently Active (06/16/2024)   Exercise Vital Sign    Days of Exercise per Week: 1 day    Minutes of Exercise per Session: 60 min  Stress: Stress Concern Present (06/16/2024)   [employer] of Occupational Health - Occupational Stress Questionnaire    Feeling of Stress: To some extent  Social Connections: Socially Isolated (06/16/2024)   Social Connection and Isolation Panel    Frequency of Communication with Friends and Family: More than three times a week    Frequency of Social Gatherings with Friends and Family: Once a week    Attends Religious Services: Never    Database administrator or Organizations: No    Attends Engineer, structural: Not on file    Marital Status: Divorced  Intimate  Partner Violence: Not At Risk (01/25/2024)   Humiliation, Afraid, Rape, and Kick questionnaire    Fear of Current or Ex-Partner: No    Emotionally Abused: No    Physically Abused: No    Sexually Abused: No     Allergies  Allergen Reactions   Ace Inhibitors Swelling   Arnuity Ellipta  [Fluticasone  Furoate] Other (See Comments)    Voice hoarseness   Atorvastatin Other (See Comments)    Myalgias   Simvastatin      Myalgia    Yellow Dyes (Non-Tartrazine) Other (See Comments)    Allergy  test   Celecoxib Other (See Comments)    hair loss  Cetirizine Hcl Other (See Comments)    fatigue   Penicillins Other (See Comments)    Unknown childhood reaction    Sulfonamide Derivatives Rash     Outpatient Medications Prior to Visit  Medication Sig Dispense Refill   CALCIUM  PO Take 1 tablet by mouth at bedtime.     Cholecalciferol 25 MCG (1000 UT) tablet Take 1,000 Units by mouth daily.     dapagliflozin  propanediol (FARXIGA ) 10 MG TABS tablet Take 1 tablet (10 mg total) by mouth daily before breakfast. 90 tablet 3   diltiazem  (CARDIZEM  CD) 120 MG 24 hr capsule Take 1 capsule (120 mg total) by mouth daily. 90 capsule 3   ELIQUIS  5 MG TABS tablet TAKE 1 TABLET TWICE DAILY 180 tablet 3   famotidine  (PEPCID ) 20 MG tablet TAKE 1 TABLET AT BEDTIME 90 tablet 3   furosemide  (LASIX ) 20 MG tablet Take 1 tablet (20 mg total) by mouth daily.     Magnesium  200 MG TABS Take 1 tablet (200 mg total) by mouth daily. 60 each    montelukast  (SINGULAIR ) 10 MG tablet Take 1 tablet (10 mg total) by mouth at bedtime. 90 tablet 3   Multiple Vitamins-Minerals (MULTIVITAMIN PO) Take 1 tablet by mouth at bedtime.     omeprazole  (PRILOSEC) 20 MG capsule TAKE 1 CAPSULE EVERY DAY 90 capsule 3   potassium chloride  SA (KLOR-CON  M) 20 MEQ tablet TAKE 1 TABLET EVERY DAY 90 tablet 3   rosuvastatin  (CRESTOR ) 20 MG tablet Take 1 tablet (20 mg total) by mouth daily. 90 tablet 3   Tiotropium Bromide-Olodaterol (STIOLTO RESPIMAT )  2.5-2.5 MCG/ACT AERS Inhale 2 puffs into the lungs daily. 12 g 1   traMADol  (ULTRAM ) 50 MG tablet Take 1 tablet (50 mg total) by mouth every 12 (twelve) hours as needed for moderate pain (pain score 4-6). 60 tablet 2   hydroxychloroquine  (PLAQUENIL ) 200 MG tablet Take 200 mg by mouth daily. (Patient not taking: Reported on 09/28/2024)     SPIRIVA  RESPIMAT 1.25 MCG/ACT AERS INHALE 2 PUFFS INTO THE LUNGS DAILY. (Patient not taking: Reported on 09/28/2024) 12 g 3   No facility-administered medications prior to visit.       Objective:   Physical Exam:  General appearance: 83 y.o., female, NAD, conversant  Eyes: anicteric sclerae; PERRL, tracking appropriately HENT: Nose deviated to the left Neck: Trachea midline; no lymphadenopathy, no JVD Lungs: CTAB, no crackles, no wheeze, with normal respiratory effort CV: RRR, no murmur  Abdomen: Soft, non-tender; non-distended, BS present  Extremities: No peripheral edema, warm Skin: Normal turgor and texture; no rash Psych: Appropriate affect Neuro: Alert and oriented to person and place, no focal deficit     Vitals:   09/28/24 1538  BP: 124/75  Pulse: 67  Temp: 98.2 F (36.8 C)  TempSrc: Oral  SpO2: 93%  Weight: 160 lb 6.4 oz (72.8 kg)  Height: 5' 3 (1.6 m)       93% on RA BMI Readings from Last 3 Encounters:  09/28/24 28.41 kg/m  07/06/24 28.63 kg/m  07/01/24 28.70 kg/m   Wt Readings from Last 3 Encounters:  09/28/24 160 lb 6.4 oz (72.8 kg)  07/06/24 161 lb 9.6 oz (73.3 kg)  07/01/24 162 lb (73.5 kg)     CBC    Component Value Date/Time   WBC 10.0 07/01/2024 1502   RBC 4.76 07/01/2024 1502   HGB 13.2 07/01/2024 1502   HGB 14.2 11/23/2023 1557   HCT 40.0 07/01/2024 1502   HCT 44.5  11/23/2023 1557   PLT 265 07/01/2024 1502   PLT 279 11/23/2023 1557   MCV 84.0 07/01/2024 1502   MCV 89 11/23/2023 1557   MCH 27.7 07/01/2024 1502   MCHC 33.0 07/01/2024 1502   RDW 15.2 07/01/2024 1502   RDW 13.8 11/23/2023 1557    LYMPHSABS 2,604 04/30/2023 1433   MONOABS 0.7 05/08/2022 1537   EOSABS 143 04/30/2023 1433   BASOSABS 67 04/30/2023 1433    Eos 240  Chest Imaging: CXR 04/06/20 reviewed by me with prominent R hilum  HRCT Chest 6/14 reviewed by me with subtle subpleural reticular opacities, air trapping, RLL 1.42mm ggo, calcified LNs  HRCT Chest 01/22/23 reviewed by me similar to prior  Pulmonary Functions Testing Results:    Latest Ref Rng & Units 07/10/2022    9:53 AM  PFT Results  FVC-Pre L 1.82   FVC-Predicted Pre % 72   FVC-Post L 1.98   FVC-Predicted Post % 79   Pre FEV1/FVC % % 68   Post FEV1/FCV % % 70   FEV1-Pre L 1.23   FEV1-Predicted Pre % 66   FEV1-Post L 1.40   DLCO uncorrected ml/min/mmHg 13.01   DLCO UNC% % 71   DLCO corrected ml/min/mmHg 13.01   DLCO COR %Predicted % 71   DLVA Predicted % 74   TLC L 5.56   TLC % Predicted % 113   RV % Predicted % 149    Personally viewed and interpreted as normal spirometry, no bronchodilator response, air trapping on lung volumes, mildly reduced diffusing capacity   Echocardiogram:   TTE 04/11/20: 1. Left ventricular ejection fraction, by estimation, is 55 to 60%. The  left ventricle has normal function. The left ventricle has no regional  wall motion abnormalities. Left ventricular diastolic parameters are  consistent with Grade II diastolic  dysfunction (pseudonormalization).   2. Right ventricular systolic function is normal. The right ventricular  size is normal. There is mildly elevated pulmonary artery systolic  pressure.   3. Left atrial size was moderately dilated.   4. The mitral valve is normal in structure. Mild to moderate mitral valve  regurgitation. No evidence of mitral stenosis.   5. The aortic valve is normal in structure. Aortic valve regurgitation is  not visualized. No aortic stenosis is present.   6. The inferior vena cava is normal in size with greater than 50%  respiratory variability, suggesting right  atrial pressure of 3 mmHg.   7. Compared to echocardiogram from 2017 MR from mild now is mild to  moderate, LA size increased to moderate enlargement from mild. Disatolic  dysfunction.   TTE 06/09/23  1. Left ventricular ejection fraction, by estimation, is 50 to 55%. The  left ventricle has low normal function. The left ventricle has no regional  wall motion abnormalities. There is mild left ventricular hypertrophy.  Left ventricular diastolic  parameters are indeterminate.   2. Right ventricular systolic function is mildly reduced. The right  ventricular size is mildly enlarged. There is moderately elevated  pulmonary artery systolic pressure. The estimated right ventricular  systolic pressure is 46.9 mmHg.   3. Left atrial size was moderately dilated.   4. Right atrial size was moderately dilated.   5. The mitral valve is degenerative. Moderate mitral valve regurgitation.  Severe mitral annular calcification.   6. The aortic valve is tricuspid. Aortic valve regurgitation is not  visualized. Aortic valve sclerosis/calcification is present, without any  evidence of aortic stenosis.   7. The inferior vena  cava is dilated in size with >50% respiratory  variability, suggesting right atrial pressure of 8 mmHg.       Assessment & Plan:   Dyspnea on exertion: Suspect multifactorial but largely due to cardiac disease given significant valvular disease, atrial fibrillation, evidence of pulmonary venous hypertension with dilated left atrium.  Also with possible contribution of asthma.  Air trapping on PFTs.   However this is unlikely be significant contributor to atrial fibrillation burden.  That is likely largely driven by her valvular heart disease.  Worsened after ablation with intermittent signs of volume overload on imaging and admission 11/2023 with CHF exacerbation.  Historically have been avoiding LABA given propensity for atrial fibrillation but was changed to Stiolto via PCP 05/2024.   Patient is unsure why and no discussion of note as to the reason for change.  Continue Stiolto for now.  Asthma: Well controlled, cough worse on arnuity, concerning for DPI making symptoms worse.  Continue Stiolto for now.  Chronic Lung nodule: 1 cm GGO.  Stable on follow-up scan 01/2024.  Initially noted 05/2022.  Worsened or enlarged solid component overall size unchanged on CT scan in the ED 06/2024.  PET scan ordered for additional evaluation.   Rebekah JONELLE Beals, MD [facility] 09/28/2024 4:32 PM

## 2024-09-29 ENCOUNTER — Encounter: Payer: Self-pay | Admitting: Family Medicine

## 2024-10-08 NOTE — Progress Notes (Unsigned)
 Cardiology Office Note:    Date:  10/13/2024   ID:  Rebekah Harrell, DOB 11-Jan-1941, MRN [id number]  PCP:  Alvan Dorothyann BIRCH, MD  Cardiologist:  Redell Shallow, MD Electrophysiologist:  Soyla Gladis Norton, MD     Referring MD: Alvan Dorothyann BIRCH, *   Chief Complaint: follow-up of atrial fibrillation, CHF, and hypertension  History of Present Illness:    Rebekah Harrell is a 83 y.o. female with a history of atypical chest pain with negative Myoview in 11/2023, persistent atrial fibrillation s/p ablation in 11/2023 on Eliquis , chronic diastolic CHF, mild to moderate MR, hypertension, hyperlipidemia, CKD stage III, COPD, and GERD who is followed by Dr. Shallow and presents today for routine follow-up of atrial fibrillation, CHF, and hypertension.      Persistent Atrial Fibrillation - Initially diagnosed in 05/2016. - Admitted in 11/2016 with atrial fibrillation with RVR and acute diastolic CHF. Echo showed normal LV function. She was started on Sotalol  and converted to sinus rhythm.  - Subsequently discontinued Eliquis  on her own due to concerns about risk of bleeding and cost.  - Noted to back in atrial fibrillation at office visit in 04/2023. She was restarted on Eliquis  and underwent successful DCCV in 05/2023 with restoration of sinus rhythm. But had quick return of atrial fibrillation within a couple of days. She ultimately underwent atrial fibrillation ablation in 11/2023 with Dr. Norton.  - Sotalol  was stopped during admission at [facility] in 11/2023 due to prolonged QTc and she was started on Metoprolol  instead.  - Metoprolol  stopped at visit in 05/2024 due to reports of significant fatigue and she was started on Diltiazem  instead.  Chronic Diastolic CHF - EF has ranged from 50-65% since 2012.  - Admitted in 11/2016 with acute diastolic CHF in setting of atrial fibrillation with RVR.  - Admitted from 12/10/2023 to 12/24/2023 at [facility] for acute hypoxic respiratory failure  secondary to bilateral pneumonia, COPD exacerbation, and acute diastolic CHF. Echo showed LVEF of 67-70% with normal wall motion and moderate diastolic dysfunction as well as mild AS/ AI and mild MR. Myoview was negative for ischemia.   Valvular Disease - Echo in 03/2020 showed mild to moderate MR.  - Echo in 05/2023 showed moderate MR.  - Echo in 11/2023 at [facility] showed mild MR and mild AS/ AI.     Patient is primarily followed by Cardiology for atrial fibrillation and diastolic CHF as above. She has had problems with recurrent atrial fibrillation and CHF over the last 1.5 years as detailed above in the narrative history. She was last seen by me in 05/2024 at which time her main complaints were fatigue/ decreased energy level and continued dyspnea on exertion. She reported chronic dyspnea on exertion but felt that this had been getting progressively worse over the last decade. She had recently seen Dr. Annella (Pulmonology) in 03/2024 who felt like her dyspnea was multifactorial but he felt this was largely due to cardiac disease given significant valvular disease, atrial fibrillation, evidence of pulmonary hypertension with dilated left atrium and felt like she was likely having intermittent episodes of volume overload. However, patient felt like her dyspnea was largely pulmonary in nature and I agreed. She was euvolemic on exam and her weight was down. Toprol -XL was stopped and she was switched to Diltiazem  instead to see if this would help with her fatigue.  She was added onto Dr. Ranee DOD schedule in 06/2024 after an ED visit for hypertension where her BP was 184/86. Her  BP was better in the office and she was advised to keep a BP log for 30 days. She was then seen in [facility] on 08/24/2024 at which time her BP log showed that her BP had been well controlled.   Patient presents today for follow-up.  Patient has multiple noncardiac complaints today including worsening low back pain and  diverticulitis.  She states her breathing is terrible but she really feels like this is due to her deviated septum since breaking her nose in April.  She has dyspnea on exertion but no dyspnea at rest.  Overall, breathing is stable.  She is following closely with Pulmonology they are managing her inhalers.  She has chronic and stable orthopnea but no PND or edema.  Weight is stable.  She has very rare episodes of chest aching that will last 15 to 30 minutes but she has not had any episodes recently.  She estimates she has only had 2 episodes since her ablation in 11/2023.  No recent issues.  She reports occasional fluttering sensation but no significant palpitations.  No lightheadedness, dizziness, or syncope.  She is overall feeling better after switching from Metoprolol  to Diltiazem  and is stable from a cardiac standpoint.  She states her heart is the least of her problems right now.  EKGs/Labs/Other Studies Reviewed:    The following studies were reviewed:  Echocardiogram 06/09/2023: Impressions: 1. Left ventricular ejection fraction, by estimation, is 50 to 55%. The  left ventricle has low normal function. The left ventricle has no regional  wall motion abnormalities. There is mild left ventricular hypertrophy.  Left ventricular diastolic  parameters are indeterminate.   2. Right ventricular systolic function is mildly reduced. The right  ventricular size is mildly enlarged. There is moderately elevated  pulmonary artery systolic pressure. The estimated right ventricular  systolic pressure is 46.9 mmHg.   3. Left atrial size was moderately dilated.   4. Right atrial size was moderately dilated.   5. The mitral valve is degenerative. Moderate mitral valve regurgitation.  Severe mitral annular calcification.   6. The aortic valve is tricuspid. Aortic valve regurgitation is not  visualized. Aortic valve sclerosis/calcification is present, without any  evidence of aortic stenosis.   7. The  inferior vena cava is dilated in size with >50% respiratory  variab  _______________   Echocardiogram 12/11/2023 ([facility]): Impressions: Left Ventricle: Systolic function is high normal to borderline  hyperdynamic. EF: 67- 70%.    Left Ventricle: Wall motion is normal.    Left Ventricle: Doppler parameters consistent with moderate diastolic  dysfunction and elevated LA pressure.    Mitral Valve: There is severe annular calcification.    Mitral Valve: Mitral valve structure is normal. The leaflets are mildly  thickened and exhibit normal excursion.    Mitral Valve: There is mild regurgitation.    Aortic Valve: The aortic valve is tricuspid. The leaflets are mildly  thickened and exhibit normal excursion. The leaflets are mildly calcified.    Aortic Valve: Mild aortic valve regurgitation.    Aortic Valve: There is mild stenosis.  Peak velocity 2.8 m/s.  Peak  gradient 31 mmHg.  Aortic valve area by planimetry 1.8 cm.  Dimensionless  index 0.38.    Left Atrium: Left atrium is moderately dilated at 4.900 cm. Left atrium  volume index is severely increased (>48 mL/m2).    Tricuspid Valve: The right ventricular systolic pressure is mildly  elevated - 44 mmHg.    Tricuspid Valve: There is  mild regurgitation. TR jet velocity 3.2  m/sec.  _______________   Pablo 12/22/2023 ([facility]): Impression: Ejection fraction calculates to 51%. No ischemia   EKG:  EKG not ordered today.   Recent Labs: 01/21/2024: BNP 191.8 02/02/2024: Magnesium  1.7 06/29/2024: TSH 4.550 07/01/2024: ALT 8; BUN 9; Creatinine, Ser 1.16; Hemoglobin 13.2; Platelets 265; Potassium 3.7; Sodium 142  Recent Lipid Panel    Component Value Date/Time   CHOL 125 06/29/2024 1131   TRIG 124 06/29/2024 1131   HDL 47 06/29/2024 1131   CHOLHDL 2.7 06/29/2024 1131   CHOLHDL 3.3 08/27/2022 1218   VLDL 26 06/19/2016 1136   LDLCALC 56 06/29/2024 1131   LDLCALC 76 08/27/2022 1218    Physical Exam:    Vital Signs: BP (!) 120/52  (BP Location: Left Arm, Patient Position: Sitting, Cuff Size: Normal)   Pulse 71   Ht 5' 3 (1.6 m)   Wt 161 lb (73 kg)   SpO2 97%   BMI 28.52 kg/m     Wt Readings from Last 3 Encounters:  10/13/24 161 lb (73 kg)  09/28/24 160 lb 6.4 oz (72.8 kg)  07/06/24 161 lb 9.6 oz (73.3 kg)     General: 83 y.o. Caucasian female in no acute distress. HEENT: Normocephalic and atraumatic. Sclera clear.  Neck: Supple. No carotid bruits. No JVD. Heart: RRR. I-II/VI systolic murmur.   Lungs: No increased work of breathing. Clear to ausculation bilaterally. No wheezes, rhonchi, or rales.  Extremities: No lower extremity edema.  Skin: Warm and dry. Neuro: No focal deficits. Psych: Normal affect. Responds appropriately.  Assessment:    1. Persistent atrial fibrillation (HCC)   2. Dyspnea on exertion   3. Chronic diastolic CHF (congestive heart failure) (HCC)   4. Mitral valve insufficiency, unspecified etiology   5. Aortic valve stenosis, etiology of cardiac valve disease unspecified   6. Aortic valve insufficiency, etiology of cardiac valve disease unspecified   7. Primary hypertension   8. Hyperlipidemia, unspecified hyperlipidemia type   9. Stage 3a chronic kidney disease (HCC)   10. Diverticulosis   11. Bowel habit changes     Plan:    Persistent Atrial Fibrillation Initially diagnosed in 2017 and has been on Sotalol . She had done very well on the Sotalol  until  04/2023 when she was noted to be back in atrial fibrillation. Ultimately underwent ablation in 11/2023. Sotalol  stopped later that month due to prolonged QTc. - She reports rare fluttering but no significant palpitations.  - Maintaining sinus rhythm on exam.  - Continue Diltiazem  120mg  daily. - Continue anticoagulation with Eliquis  5mg  twice daily.   Dyspnea on Exertion Chronic Diastolic CHF Last Echo in 11/2023 showed LVEF of 67-70% with normal wall motion and moderate diastolic dysfunction as well as mild AS/ AI and mild  MR.  - She continues to have chronic dyspnea on exertion but this is stable. She is euvolemic on exam. Weight stable. - Continue Lasix  20mg  daily.  - Continue Farxiga  10mg  daily.  - Suspect dyspnea is multifactorial with CHF and COPD contributing. She also feels current dyspnea is largely due to her deviated septum which occurred after she broke her nose earlier this year. She is working on scheduling an appointment with a Engineer, petroleum to see if there is anything that can be done for about this. No further cardiac work-up for dyspnea necessary at this time.    Atypical Chest Pain  Patient has a history of atypical chest pain. Myoview during admission at [facility] in 11/2023 was  negative.  - She has rare episodes of atypical chest pain but states this is much improved from before. She has only had 2 episodes of this since her ablation in 11/2023. No recent issues.  - No additional ischemic work-up necessary at this time.     Mild to Moderate Mitral Regurgitation  Mild Aortic Stenosis/ Insufficiency Echo in 05/2023 showed moderate MR; however, most recent Echo in 11/2023 at [facility] showed only mild MR as well as mild AS/ AI. - Will repeat Echo in 11/2024 for routine monitoring.    Hypertension BP well controlled.  - Continue Diltiazem  120mg  daily.    Hyperlipidemia Lipid panel in 06/2024: Total Cholesterol 125, Triglycerides 124, HDL 47, LDL 56.  - Continue Crestor  20mg  daily   CKD Stage III Baseline creatinine around 0.9 to 1.2.   Diverticulosis  Bowel changes Recent PET scan showed left colon diverticulosis which she is concerned about. She also reports some recent bowel changes. She states she used to have constant diarrhea until a couple of months ago when she started having constipation.  - Offered referral to GI which she was in agreement to. Will place referral.   Disposition: Follow up in 4 months.   Signed, Aline FORBES Door, PA-C  10/13/2024 4:01 PM    [facility]

## 2024-10-10 ENCOUNTER — Ambulatory Visit: Admitting: Cardiovascular Disease

## 2024-10-10 ENCOUNTER — Ambulatory Visit (HOSPITAL_COMMUNITY)
Admission: RE | Admit: 2024-10-10 | Discharge: 2024-10-10 | Disposition: A | Discharge status: Home / Self Care | Source: Ambulatory Visit | Attending: Pulmonary Disease | Admitting: Pulmonary Disease

## 2024-10-10 DIAGNOSIS — R911 Solitary pulmonary nodule: Secondary | ICD-10-CM | POA: Insufficient documentation

## 2024-10-10 LAB — GLUCOSE, CAPILLARY: Glucose-Capillary: 101 mg/dL — ABNORMAL HIGH (ref 70–99)

## 2024-10-10 MED ORDER — FLUDEOXYGLUCOSE F - 18 (FDG) INJECTION
8.0000 | Freq: Once | INTRAVENOUS | Status: AC | PRN
  Administered 2024-10-10: 7.91 via INTRAVENOUS

## 2024-10-12 ENCOUNTER — Ambulatory Visit: Payer: Self-pay | Admitting: Pulmonary Disease

## 2024-10-13 ENCOUNTER — Encounter: Payer: Self-pay | Admitting: Student

## 2024-10-13 ENCOUNTER — Ambulatory Visit: Attending: Student | Admitting: Student

## 2024-10-13 VITALS — BP 120/52 | HR 71 | Ht 63.0 in | Wt 161.0 lb

## 2024-10-13 DIAGNOSIS — R194 Change in bowel habit: Secondary | ICD-10-CM

## 2024-10-13 DIAGNOSIS — I5032 Chronic diastolic (congestive) heart failure: Secondary | ICD-10-CM | POA: Diagnosis not present

## 2024-10-13 DIAGNOSIS — K5790 Diverticulosis of intestine, part unspecified, without perforation or abscess without bleeding: Secondary | ICD-10-CM

## 2024-10-13 DIAGNOSIS — I340 Nonrheumatic mitral (valve) insufficiency: Secondary | ICD-10-CM | POA: Diagnosis not present

## 2024-10-13 DIAGNOSIS — N1831 Chronic kidney disease, stage 3a: Secondary | ICD-10-CM | POA: Diagnosis not present

## 2024-10-13 DIAGNOSIS — E785 Hyperlipidemia, unspecified: Secondary | ICD-10-CM | POA: Diagnosis not present

## 2024-10-13 DIAGNOSIS — I10 Essential (primary) hypertension: Secondary | ICD-10-CM

## 2024-10-13 DIAGNOSIS — R0609 Other forms of dyspnea: Secondary | ICD-10-CM | POA: Diagnosis not present

## 2024-10-13 DIAGNOSIS — I350 Nonrheumatic aortic (valve) stenosis: Secondary | ICD-10-CM

## 2024-10-13 DIAGNOSIS — I4819 Other persistent atrial fibrillation: Secondary | ICD-10-CM

## 2024-10-13 DIAGNOSIS — I351 Nonrheumatic aortic (valve) insufficiency: Secondary | ICD-10-CM

## 2024-10-13 NOTE — Patient Instructions (Addendum)
 Thank you for choosing [facility]!     Medication Instructions:  No medication changes were made during today's visit.   A referral has been ordered for Gastro in [place].   *If you need a refill on your cardiac medications before your next appointment, please call your pharmacy*   Lab Work: No labs were ordered during today's visit.  If you have labs (blood work) drawn today and your tests are completely normal, you will receive your results only by: MyChart Message (if you have MyChart) OR A paper copy in the mail If you have any lab test that is abnormal or we need to change your treatment, we will call you to review the results.   Testing/Procedures: Your physician has requested that you have an echocardiogram in December 2025. Echocardiography is a painless test that uses sound waves to create images of your heart. It provides your doctor with information about the size and shape of your heart and how well your heart's chambers and valves are working. This procedure takes approximately one hour. There are no restrictions for this procedure. Please do NOT wear cologne, perfume, aftershave, or lotions (deodorant is allowed). Please arrive 15 minutes prior to your appointment time.  Please note: We ask at that you not bring children with you during ultrasound (echo/ vascular) testing. Due to room size and safety concerns, children are not allowed in the ultrasound rooms during exams. Our front office staff cannot provide observation of children in our lobby area while testing is being conducted. An adult accompanying a patient to their appointment will only be allowed in the ultrasound room at the discretion of the ultrasound technician under special circumstances. We apologize for any inconvenience.   Your next appointment:   4 month(s)   Provider:   Redell Shallow, MD     Follow-Up: At [facility], you and your health needs are our priority.  As part  of our continuing mission to provide you with exceptional heart care, we have created designated Provider Care Teams.  These Care Teams include your primary Cardiologist (physician) and Advanced Practice Providers (APPs -  Physician Assistants and Nurse Practitioners) who all work together to provide you with the care you need, when you need it. We recommend signing up for the patient portal called MyChart.  Sign up information is provided on this After Visit Summary.  MyChart is used to connect with patients for Virtual Visits (Telemedicine).  Patients are able to view lab/test results, encounter notes, upcoming appointments, etc.  Non-urgent messages can be sent to your provider as well.   To learn more about what you can do with MyChart, go to [email or web address].

## 2024-10-23 ENCOUNTER — Other Ambulatory Visit: Payer: Self-pay | Admitting: Family Medicine

## 2024-10-23 DIAGNOSIS — K2100 Gastro-esophageal reflux disease with esophagitis, without bleeding: Secondary | ICD-10-CM

## 2024-11-03 ENCOUNTER — Other Ambulatory Visit: Payer: Self-pay | Admitting: Family Medicine

## 2024-11-03 ENCOUNTER — Other Ambulatory Visit: Payer: Self-pay | Admitting: Cardiology

## 2024-11-03 DIAGNOSIS — E785 Hyperlipidemia, unspecified: Secondary | ICD-10-CM

## 2024-11-03 DIAGNOSIS — J449 Chronic obstructive pulmonary disease, unspecified: Secondary | ICD-10-CM

## 2024-11-03 NOTE — Telephone Encounter (Unsigned)
 Copied from CRM #[id number]. Topic: Clinical - Medication Refill >> Nov 03, 2024 12:08 PM Antwanette L wrote: Medication: Tiotropium Bromide-Olodaterol (STIOLTO RESPIMAT ) 2.5-2.5 MCG/ACT AERS  Has the patient contacted their pharmacy? Yes   This is the patient's preferred pharmacy:   [facility] - [place], [place] - [address] [place] [address] Phone: [phone] Fax: [phone]    Is this the correct pharmacy for this prescription? Yes   Has the prescription been filled recently? Yes.Last refill was on 06/20/24  Is the patient out of the medication? Yes  Has the patient been seen for an appointment in the last year OR does the patient have an upcoming appointment? Yes. Last ov with Dr. Alvan was on 06/20/24 and next appt is 12/20/24  Can we respond through MyChart? No. Contact the patient by phone at [phone]  Agent: Please be advised that Rx refills may take up to 3 business days. We ask that you follow-up with your pharmacy.

## 2024-11-03 NOTE — Telephone Encounter (Unsigned)
 Copied from CRM #[id number]. Topic: Clinical - Medication Refill >> Nov 03, 2024 12:08 PM Antwanette L wrote: Medication: Tiotropium Bromide-Olodaterol (STIOLTO RESPIMAT ) 2.5-2.5 MCG/ACT AERS  Has the patient contacted their pharmacy? Yes   This is the patient's preferred pharmacy:   [facility] - [place], [place] - [address] [place] [address] Phone: [phone] Fax: [phone]    Is this the correct pharmacy for this prescription? Yes   Has the prescription been filled recently? Yes.Last refill was on 06/20/24  Is the patient out of the medication? Yes  Has the patient been seen for an appointment in the last year OR does the patient have an upcoming appointment? Yes. Last ov with Dr. Alvan was on 06/20/24 and next appt is 12/20/24  Can we respond through MyChart? No. Contact the patient by phone at [phone]  Agent: Please be advised that Rx refills may take up to 3 business days. We ask that you follow-up with your pharmacy. >> Nov 03, 2024 12:11 PM Antwanette L wrote: [facility] is requesting a new prescription for Tiotropium Bromide-Olodaterol (Stiolto Respimat ) 2.5-2.5 mcg/act aerosol

## 2024-11-09 ENCOUNTER — Ambulatory Visit: Admitting: Pulmonary Disease

## 2024-11-09 ENCOUNTER — Encounter: Payer: Self-pay | Admitting: Pulmonary Disease

## 2024-11-09 VITALS — BP 132/76 | HR 72 | Temp 98.1°F | Ht 63.0 in | Wt 159.0 lb

## 2024-11-09 DIAGNOSIS — K5900 Constipation, unspecified: Secondary | ICD-10-CM | POA: Diagnosis not present

## 2024-11-09 DIAGNOSIS — J45909 Unspecified asthma, uncomplicated: Secondary | ICD-10-CM | POA: Diagnosis not present

## 2024-11-09 DIAGNOSIS — R911 Solitary pulmonary nodule: Secondary | ICD-10-CM

## 2024-11-09 DIAGNOSIS — R0609 Other forms of dyspnea: Secondary | ICD-10-CM | POA: Diagnosis not present

## 2024-11-09 NOTE — Patient Instructions (Signed)
 Nice to see you again  No changes to inhaler, continue Stiolto 2 puffs once a day  It is possible to increase dose of Tiotropium in Stiolto has caused him constipation  Keep an eye on the palpitations, lets make sure is not any more frequent  I referral to a gastroenterologist, GI doctor, in [place] for evaluation of constipation  Return to clinic in 3 months after CT scan

## 2024-11-09 NOTE — Progress Notes (Signed)
 Synopsis: Referred for COPD by Alvan Dorothyann BIRCH, *  Subjective:   PATIENT ID: Rebekah Harrell GENDER: female DOB: 1941-01-24, MRN: [id number]  Chief Complaint  Patient presents with   Shortness of Breath    No improvement in breathing. PET scan result.   Asthma   COPD   83 y.o. with history of allergy , asthma, COPD, AF, CKD, GERD, hiatal hernia, +ANA with recurrent angioedema of unclear etiology on plaquenil  followed by [facility], referred for COPD and worsening DOE whom we are seeing for follow-up.  Most recent PCP note reviewed.    Returns to clinic.  Lung nodule was present.  Prompted PET scan after last visit.  Reviewed with patient, no uptake good news.  Plan for repeat CT scan 35-month interval, 01/2025.  Her shortness of breath a little better on Stiolto.  No worsening palpitations.  Had a prolonged episode last night but had not had so in some time.  Over the last several months does not seem like any increased frequency of palpitations.  She is more constipated.  Does coincide with Stiolto prescription.  The Tia Tropium dose is higher than it was in the Spiriva .  Anticholinergic effect could be affecting her with constipation.  HPI initial visit: She says she's had breathing problems ever since she was little, attributes it at least in part to secondhand smoke exposure in past.   Now has DOE where she has stop speaking to catch her breath if she's in conversation however she is able to relate a good history today without interruption. DOE to 100 feet. She has no cough. No orthopnea. Weight has gone up recently. She was on course of prednisone  for 10 days over January prescribed by rheumatologist but she noticed no effect on her breathing. Very rare sensation of dysphagia - doesn't frequently get choked on food/liquid. She snores once in a while. No PND. She is sleepy during the day but attributes it to her antihistamine regimen.   She didn't tolerate wixela well due to the  dry powder formulation causing cough.   She sees allergist at [facility] [place] sees Dr. Scannell. On a bunch of antihistamines due to recurrent angioedema of unclear etiology. Has not been on AIT  No family history of lung disease  She worked staging houses in past, oceanographer in past. Lots of secondhand smoke exposure but she never smoked herself, no vaping/MJ. Has a dog and cat at home.     Past Medical History:  Diagnosis Date   Allergy     Aortic atherosclerosis    Arthritis    lower back; knees (11/26/2016)   Asthma    Chronic atrial fibrillation (HCC)    CKD (chronic kidney disease), stage III (HCC)    patient unaware of this on 11/26/2016   Complication of anesthesia    had trouble waking me up in the late 1990's after gallbladder OR   COPD (chronic obstructive pulmonary disease) (HCC)    GERD (gastroesophageal reflux disease)    Heart murmur    dx'd when I was a little kid   History of hiatal hernia    Hyperlipidemia    Hypertension    Hypoglycemia      Family History  Problem Relation Age of Onset   CAD Mother        HEART ATTACK   Colon cancer Father        KIDNEY FAILURE/COLON CANCER   Atrial fibrillation Brother    Healthy Brother  Heart failure Maternal Grandmother    Kidney failure Maternal Grandfather    Emphysema Maternal Grandfather    Heart attack Paternal Grandmother      Past Surgical History:  Procedure Laterality Date   ATRIAL FIBRILLATION ABLATION N/A 12/08/2023   Procedure: ATRIAL FIBRILLATION ABLATION;  Surgeon: Inocencio Soyla Lunger, MD;  Location: [facility] INVASIVE CV LAB;  Service: Cardiovascular;  Laterality: N/A;   CARDIOVERSION N/A 06/10/2023   Procedure: CARDIOVERSION;  Surgeon: Kate Lonni CROME, MD;  Location: [facility] INVASIVE CV LAB;  Service: Cardiovascular;  Laterality: N/A;   CHOLECYSTECTOMY OPEN  1990s   TONSILLECTOMY     TUBAL LIGATION      Social History   Socioeconomic History   Marital  status: Divorced    Spouse name: Not on file   Number of children: 3   Years of education: 14   Highest education level: Some college, no degree  Occupational History   Occupation: stages houses for sale    Comment: self employed   Occupation: Retired.  Tobacco Use   Smoking status: Never    Passive exposure: Past   Smokeless tobacco: Never  Vaping Use   Vaping status: Never Used  Substance and Sexual Activity   Alcohol use: Not Currently   Drug use: No   Sexual activity: Not Currently  Other Topics Concern   Not on file  Social History Narrative   Lives alone. One son lives close by incase she needs anything. She enjoys crafting and decorating.    Social Drivers of Health   Financial Resource Strain: Medium Risk (06/16/2024)   Overall Financial Resource Strain (CARDIA)    Difficulty of Paying Living Expenses: Somewhat hard  Food Insecurity: Patient Declined (06/16/2024)   Hunger Vital Sign    Worried About Running Out of Food in the Last Year: Patient declined    Ran Out of Food in the Last Year: Patient declined  Transportation Needs: No Transportation Needs (06/16/2024)   PRAPARE - Administrator, Civil Service (Medical): No    Lack of Transportation (Non-Medical): No  Physical Activity: Insufficiently Active (06/16/2024)   Exercise Vital Sign    Days of Exercise per Week: 1 day    Minutes of Exercise per Session: 60 min  Stress: Stress Concern Present (06/16/2024)   [employer] of Occupational Health - Occupational Stress Questionnaire    Feeling of Stress: To some extent  Social Connections: Socially Isolated (06/16/2024)   Social Connection and Isolation Panel    Frequency of Communication with Friends and Family: More than three times a week    Frequency of Social Gatherings with Friends and Family: Once a week    Attends Religious Services: Never    Database Administrator or Organizations: No    Attends Engineer, Structural: Not on file     Marital Status: Divorced  Intimate Partner Violence: Not At Risk (01/25/2024)   Humiliation, Afraid, Rape, and Kick questionnaire    Fear of Current or Ex-Partner: No    Emotionally Abused: No    Physically Abused: No    Sexually Abused: No     Allergies  Allergen Reactions   Ace Inhibitors Swelling   Arnuity Ellipta  [Fluticasone  Furoate] Other (See Comments)    Voice hoarseness   Atorvastatin Other (See Comments)    Myalgias   Simvastatin      Myalgia    Yellow Dyes (Non-Tartrazine) Other (See Comments)    Allergy  test   Celecoxib Other (See Comments)  hair loss   Cetirizine Hcl Other (See Comments)    fatigue   Penicillins Other (See Comments)    Unknown childhood reaction    Sulfonamide Derivatives Rash     Outpatient Medications Prior to Visit  Medication Sig Dispense Refill   CALCIUM  PO Take 1 tablet by mouth at bedtime.     Cholecalciferol 25 MCG (1000 UT) tablet Take 1,000 Units by mouth daily.     dapagliflozin  propanediol (FARXIGA ) 10 MG TABS tablet Take 1 tablet (10 mg total) by mouth daily before breakfast. 90 tablet 3   diltiazem  (CARDIZEM  CD) 120 MG 24 hr capsule Take 1 capsule (120 mg total) by mouth daily. 90 capsule 3   ELIQUIS  5 MG TABS tablet TAKE 1 TABLET TWICE DAILY 180 tablet 3   famotidine  (PEPCID ) 20 MG tablet TAKE 1 TABLET AT BEDTIME 90 tablet 3   furosemide  (LASIX ) 20 MG tablet Take 1 tablet (20 mg total) by mouth daily.     Magnesium  200 MG TABS Take 1 tablet (200 mg total) by mouth daily. 60 each    montelukast  (SINGULAIR ) 10 MG tablet Take 1 tablet (10 mg total) by mouth at bedtime. 90 tablet 3   Multiple Vitamins-Minerals (MULTIVITAMIN PO) Take 1 tablet by mouth at bedtime.     omeprazole  (PRILOSEC) 20 MG capsule TAKE 1 CAPSULE EVERY DAY 90 capsule 3   potassium chloride  SA (KLOR-CON  M) 20 MEQ tablet TAKE 1 TABLET EVERY DAY 90 tablet 3   rosuvastatin  (CRESTOR ) 20 MG tablet TAKE 1 TABLET EVERY DAY 90 tablet 3   SPIRIVA  RESPIMAT 1.25 MCG/ACT  AERS INHALE 2 PUFFS INTO THE LUNGS DAILY. 12 g 3   Tiotropium Bromide-Olodaterol (STIOLTO RESPIMAT ) 2.5-2.5 MCG/ACT AERS Inhale 2 puffs into the lungs daily. 12 g 1   traMADol  (ULTRAM ) 50 MG tablet Take 1 tablet (50 mg total) by mouth every 12 (twelve) hours as needed for moderate pain (pain score 4-6). 60 tablet 2   hydroxychloroquine  (PLAQUENIL ) 200 MG tablet Take 200 mg by mouth daily. (Patient not taking: Reported on 11/09/2024)     No facility-administered medications prior to visit.       Objective:   Physical Exam:  General appearance: In no distress, sitting in chair Eyes: No icterus Pulmonary: Clear, no work of breathing Cardiovascular: Regular rate and rhythm, no murmur appreciated Abdomen: Nondistended    Vitals:   11/09/24 1506  BP: 132/76  Pulse: 72  Temp: 98.1 F (36.7 C)  TempSrc: Oral  SpO2: 94%  Weight: 159 lb (72.1 kg)  Height: 5' 3 (1.6 m)       94% on RA BMI Readings from Last 3 Encounters:  11/09/24 28.17 kg/m  10/13/24 28.52 kg/m  09/28/24 28.41 kg/m   Wt Readings from Last 3 Encounters:  11/09/24 159 lb (72.1 kg)  10/13/24 161 lb (73 kg)  09/28/24 160 lb 6.4 oz (72.8 kg)   Labs reviewed  CBC    Component Value Date/Time   WBC 10.0 07/01/2024 1502   RBC 4.76 07/01/2024 1502   HGB 13.2 07/01/2024 1502   HGB 14.2 11/23/2023 1557   HCT 40.0 07/01/2024 1502   HCT 44.5 11/23/2023 1557   PLT 265 07/01/2024 1502   PLT 279 11/23/2023 1557   MCV 84.0 07/01/2024 1502   MCV 89 11/23/2023 1557   MCH 27.7 07/01/2024 1502   MCHC 33.0 07/01/2024 1502   RDW 15.2 07/01/2024 1502   RDW 13.8 11/23/2023 1557   LYMPHSABS 2,604 04/30/2023 1433  MONOABS 0.7 05/08/2022 1537   EOSABS 143 04/30/2023 1433   BASOSABS 67 04/30/2023 1433    Chest Imaging: As per EMR and discussion in this note  Pulmonary Functions Testing Results:    Latest Ref Rng & Units 07/10/2022    9:53 AM  PFT Results  FVC-Pre L 1.82   FVC-Predicted Pre % 72    FVC-Post L 1.98   FVC-Predicted Post % 79   Pre FEV1/FVC % % 68   Post FEV1/FCV % % 70   FEV1-Pre L 1.23   FEV1-Predicted Pre % 66   FEV1-Post L 1.40   DLCO uncorrected ml/min/mmHg 13.01   DLCO UNC% % 71   DLCO corrected ml/min/mmHg 13.01   DLCO COR %Predicted % 71   DLVA Predicted % 74   TLC L 5.56   TLC % Predicted % 113   RV % Predicted % 149    Personally viewed and interpreted as normal spirometry, no bronchodilator response, air trapping on lung volumes, mildly reduced diffusing capacity   Echocardiogram:   TTE 04/11/20: 1. Left ventricular ejection fraction, by estimation, is 55 to 60%. The  left ventricle has normal function. The left ventricle has no regional  wall motion abnormalities. Left ventricular diastolic parameters are  consistent with Grade II diastolic  dysfunction (pseudonormalization).   2. Right ventricular systolic function is normal. The right ventricular  size is normal. There is mildly elevated pulmonary artery systolic  pressure.   3. Left atrial size was moderately dilated.   4. The mitral valve is normal in structure. Mild to moderate mitral valve  regurgitation. No evidence of mitral stenosis.   5. The aortic valve is normal in structure. Aortic valve regurgitation is  not visualized. No aortic stenosis is present.   6. The inferior vena cava is normal in size with greater than 50%  respiratory variability, suggesting right atrial pressure of 3 mmHg.   7. Compared to echocardiogram from 2017 MR from mild now is mild to  moderate, LA size increased to moderate enlargement from mild. Disatolic  dysfunction.   TTE 06/09/23  1. Left ventricular ejection fraction, by estimation, is 50 to 55%. The  left ventricle has low normal function. The left ventricle has no regional  wall motion abnormalities. There is mild left ventricular hypertrophy.  Left ventricular diastolic  parameters are indeterminate.   2. Right ventricular systolic function is  mildly reduced. The right  ventricular size is mildly enlarged. There is moderately elevated  pulmonary artery systolic pressure. The estimated right ventricular  systolic pressure is 46.9 mmHg.   3. Left atrial size was moderately dilated.   4. Right atrial size was moderately dilated.   5. The mitral valve is degenerative. Moderate mitral valve regurgitation.  Severe mitral annular calcification.   6. The aortic valve is tricuspid. Aortic valve regurgitation is not  visualized. Aortic valve sclerosis/calcification is present, without any  evidence of aortic stenosis.   7. The inferior vena cava is dilated in size with >50% respiratory  variability, suggesting right atrial pressure of 8 mmHg.       Assessment & Plan:   Dyspnea on exertion: Suspect multifactorial but largely due to cardiac disease given significant valvular disease, atrial fibrillation, evidence of pulmonary venous hypertension with dilated left atrium.  Also with possible contribution of asthma.  Air trapping noted on PFTs.  Some improvement with escalation from of Spiriva  to Stiolto.  No worsening palpitation burden.  Continue Stiolto for now.  Asthma: Well controlled,  cough worse on arnuity, concerning for DPI making symptoms worse.  Continue Stiolto for now.  Chronic Lung nodule: 1 cm GGO.  Stable on follow-up scan 01/2024.  Initially noted 05/2022.  Worsened or enlarged solid component overall size unchanged on CT scan in the ED 06/2024.  PET scan/2025 not PET avid, 75-month interval scan 01/2025 ordered.  Constipation: Suspect could be related to increased anticholinergic effects with increased dose of Tio Tropium in Stiolto compared to prior Spiriva .  Onset of symptoms coincides with Stiolto prescription.  Previously had diarrhea.  Referral to GI today.  Improved with some stool softeners.   Donnice JONELLE Beals, MD [facility] 11/09/2024 3:12 PM

## 2024-11-20 ENCOUNTER — Emergency Department (HOSPITAL_BASED_OUTPATIENT_CLINIC_OR_DEPARTMENT_OTHER)

## 2024-11-20 ENCOUNTER — Other Ambulatory Visit: Payer: Self-pay

## 2024-11-20 ENCOUNTER — Inpatient Hospital Stay (HOSPITAL_BASED_OUTPATIENT_CLINIC_OR_DEPARTMENT_OTHER)
Admission: EM | Admit: 2024-11-20 | Discharge: 2024-11-23 | DRG: 309 | Disposition: A | Attending: Internal Medicine | Admitting: Internal Medicine

## 2024-11-20 ENCOUNTER — Telehealth: Payer: Self-pay | Admitting: Student

## 2024-11-20 ENCOUNTER — Encounter (HOSPITAL_BASED_OUTPATIENT_CLINIC_OR_DEPARTMENT_OTHER): Payer: Self-pay

## 2024-11-20 DIAGNOSIS — I452 Bifascicular block: Secondary | ICD-10-CM | POA: Diagnosis not present

## 2024-11-20 DIAGNOSIS — I495 Sick sinus syndrome: Secondary | ICD-10-CM | POA: Diagnosis present

## 2024-11-20 DIAGNOSIS — I4819 Other persistent atrial fibrillation: Secondary | ICD-10-CM | POA: Diagnosis not present

## 2024-11-20 DIAGNOSIS — I483 Typical atrial flutter: Secondary | ICD-10-CM

## 2024-11-20 DIAGNOSIS — R001 Bradycardia, unspecified: Secondary | ICD-10-CM | POA: Diagnosis not present

## 2024-11-20 DIAGNOSIS — I4892 Unspecified atrial flutter: Principal | ICD-10-CM | POA: Diagnosis present

## 2024-11-20 DIAGNOSIS — I498 Other specified cardiac arrhythmias: Secondary | ICD-10-CM | POA: Diagnosis not present

## 2024-11-20 DIAGNOSIS — R55 Syncope and collapse: Secondary | ICD-10-CM

## 2024-11-20 LAB — BASIC METABOLIC PANEL WITH GFR
Anion gap: 14 (ref 5–15)
BUN: 18 mg/dL (ref 8–23)
CO2: 24 mmol/L (ref 22–32)
Calcium: 9.8 mg/dL (ref 8.9–10.3)
Chloride: 102 mmol/L (ref 98–111)
Creatinine, Ser: 1.24 mg/dL — ABNORMAL HIGH (ref 0.44–1.00)
GFR, Estimated: 43 mL/min — ABNORMAL LOW (ref >60)
Glucose, Bld: 102 mg/dL — ABNORMAL HIGH (ref 70–99)
Potassium: 4 mmol/L (ref 3.5–5.1)
Sodium: 139 mmol/L (ref 135–145)

## 2024-11-20 LAB — TROPONIN T, HIGH SENSITIVITY: Troponin T High Sensitivity: 16 ng/L (ref 0–19)

## 2024-11-20 LAB — CBC
HCT: 43.2 % (ref 36.0–46.0)
Hemoglobin: 14.1 g/dL (ref 12.0–15.0)
MCH: 27.8 pg (ref 26.0–34.0)
MCHC: 32.6 g/dL (ref 30.0–36.0)
MCV: 85 fL (ref 80.0–100.0)
Platelets: 263 K/uL (ref 150–400)
RBC: 5.08 MIL/uL (ref 3.87–5.11)
RDW: 15.7 % — ABNORMAL HIGH (ref 11.5–15.5)
WBC: 10.1 K/uL (ref 4.0–10.5)
nRBC: 0 % (ref 0.0–0.2)

## 2024-11-20 LAB — TROPONIN I (HIGH SENSITIVITY)
Troponin I (High Sensitivity): 24 ng/L — ABNORMAL HIGH (ref <18)
Troponin I (High Sensitivity): 26 ng/L — ABNORMAL HIGH (ref <18)

## 2024-11-20 LAB — TSH: TSH: 2.431 u[IU]/mL (ref 0.350–4.500)

## 2024-11-20 LAB — MRSA NEXT GEN BY PCR, NASAL: MRSA by PCR Next Gen: NOT DETECTED

## 2024-11-20 LAB — MAGNESIUM: Magnesium: 1.9 mg/dL (ref 1.7–2.4)

## 2024-11-20 MED ORDER — ROSUVASTATIN CALCIUM 20 MG PO TABS
20.0000 mg | ORAL_TABLET | Freq: Every day | ORAL | Status: DC
  Administered 2024-11-20 – 2024-11-23 (×4): 20 mg via ORAL
  Filled 2024-11-20 (×4): qty 1

## 2024-11-20 MED ORDER — NITROGLYCERIN 0.4 MG SL SUBL: 0.4000 mg | SUBLINGUAL_TABLET | SUBLINGUAL | Status: DC | PRN

## 2024-11-20 MED ORDER — METOPROLOL TARTRATE 5 MG/5ML IV SOLN
INTRAVENOUS | Status: AC
  Filled 2024-11-20: qty 5

## 2024-11-20 MED ORDER — POTASSIUM CHLORIDE CRYS ER 20 MEQ PO TBCR
20.0000 meq | EXTENDED_RELEASE_TABLET | Freq: Every day | ORAL | Status: DC
  Administered 2024-11-20 – 2024-11-23 (×4): 20 meq via ORAL
  Filled 2024-11-20 (×4): qty 1

## 2024-11-20 MED ORDER — PANTOPRAZOLE SODIUM 40 MG PO TBEC
40.0000 mg | DELAYED_RELEASE_TABLET | Freq: Every day | ORAL | Status: DC
  Administered 2024-11-20 – 2024-11-23 (×4): 40 mg via ORAL
  Filled 2024-11-20 (×4): qty 1

## 2024-11-20 MED ORDER — TRAMADOL HCL 50 MG PO TABS
50.0000 mg | ORAL_TABLET | Freq: Two times a day (BID) | ORAL | Status: DC | PRN
  Administered 2024-11-20 – 2024-11-22 (×4): 50 mg via ORAL
  Filled 2024-11-20 (×4): qty 1

## 2024-11-20 MED ORDER — ARFORMOTEROL TARTRATE 15 MCG/2ML IN NEBU
15.0000 ug | INHALATION_SOLUTION | Freq: Two times a day (BID) | RESPIRATORY_TRACT | Status: DC
  Administered 2024-11-20 – 2024-11-23 (×5): 15 ug via RESPIRATORY_TRACT
  Filled 2024-11-20 (×5): qty 2

## 2024-11-20 MED ORDER — FAMOTIDINE 20 MG PO TABS
20.0000 mg | ORAL_TABLET | Freq: Every day | ORAL | Status: DC
  Administered 2024-11-20 – 2024-11-22 (×3): 20 mg via ORAL
  Filled 2024-11-20 (×3): qty 1

## 2024-11-20 MED ORDER — ACETAMINOPHEN 325 MG PO TABS
650.0000 mg | ORAL_TABLET | ORAL | Status: DC | PRN
  Administered 2024-11-22 – 2024-11-23 (×3): 650 mg via ORAL
  Filled 2024-11-20 (×3): qty 2

## 2024-11-20 MED ORDER — MONTELUKAST SODIUM 10 MG PO TABS
10.0000 mg | ORAL_TABLET | Freq: Every day | ORAL | Status: DC
  Administered 2024-11-20 – 2024-11-22 (×3): 10 mg via ORAL
  Filled 2024-11-20 (×3): qty 1

## 2024-11-20 MED ORDER — ONDANSETRON HCL 4 MG/2ML IJ SOLN: 4.0000 mg | Freq: Four times a day (QID) | INTRAMUSCULAR | Status: DC | PRN

## 2024-11-20 MED ORDER — FUROSEMIDE 20 MG PO TABS
20.0000 mg | ORAL_TABLET | Freq: Every day | ORAL | Status: DC
  Administered 2024-11-21 – 2024-11-23 (×3): 20 mg via ORAL
  Filled 2024-11-20 (×3): qty 1

## 2024-11-20 MED ORDER — UMECLIDINIUM BROMIDE 62.5 MCG/ACT IN AEPB
1.0000 | INHALATION_SPRAY | Freq: Every day | RESPIRATORY_TRACT | Status: DC
  Administered 2024-11-21 – 2024-11-23 (×3): 1 via RESPIRATORY_TRACT
  Filled 2024-11-20: qty 7

## 2024-11-20 MED ORDER — METOPROLOL TARTRATE 5 MG/5ML IV SOLN
5.0000 mg | Freq: Once | INTRAVENOUS | Status: AC
  Administered 2024-11-20: 5 mg via INTRAVENOUS

## 2024-11-20 NOTE — ED Provider Notes (Addendum)
 [facility] Provider Note   CSN: [id number] Arrival date & time: 11/20/24  1357     Patient presents with: Palpitations   Rebekah Harrell is a 83 y.o. female.   Patient is a 83 year old female with past medical history of hypertension, hyperlipidemia, congestive heart failure, chronic atrial fibrillation with an ablation in 2024 on Cardizem  and Eliquis  presenting for complaints of palpitations.  Patient presented in atrial flutter with a rate of 180 bpm.  She is currently awake and alert with a stable blood pressure of 135/119.  Denies lightheadedness, chest pressure, dizziness, fatigue.  The history is provided by the patient. No language interpreter was used.  Palpitations Associated symptoms: no back pain, no chest pain, no cough, no shortness of breath and no vomiting        Prior to Admission medications   Medication Sig Start Date End Date Taking? Authorizing Provider  CALCIUM  PO Take 1 tablet by mouth at bedtime.    [provider]  Cholecalciferol 25 MCG (1000 UT) tablet Take 1,000 Units by mouth daily.    [provider]  dapagliflozin  propanediol (FARXIGA ) 10 MG TABS tablet Take 1 tablet (10 mg total) by mouth daily before breakfast. 02/11/24   Goodrich, Callie E, PA-C  diltiazem  (CARDIZEM  CD) 120 MG 24 hr capsule Take 1 capsule (120 mg total) by mouth daily. 06/09/24   Goodrich, Callie E, PA-C  ELIQUIS  5 MG TABS tablet TAKE 1 TABLET TWICE DAILY 06/08/24   Pietro Redell RAMAN, MD  famotidine  (PEPCID ) 20 MG tablet TAKE 1 TABLET AT BEDTIME 10/24/24   Alvan Dorothyann BIRCH, MD  furosemide  (LASIX ) 20 MG tablet Take 1 tablet (20 mg total) by mouth daily. 06/09/24   Goodrich, Callie E, PA-C  hydroxychloroquine  (PLAQUENIL ) 200 MG tablet Take 200 mg by mouth daily. Patient not taking: Reported on 11/09/2024 04/01/24   [provider]  Magnesium  200 MG TABS Take 1 tablet (200 mg total) by mouth daily. 12/10/16    Dow Arland BROCKS, NP  montelukast  (SINGULAIR ) 10 MG tablet Take 1 tablet (10 mg total) by mouth at bedtime. 07/13/24   Alvan Dorothyann BIRCH, MD  Multiple Vitamins-Minerals (MULTIVITAMIN PO) Take 1 tablet by mouth at bedtime.    [provider]  omeprazole  (PRILOSEC) 20 MG capsule TAKE 1 CAPSULE EVERY DAY 03/16/24   Alvan Dorothyann BIRCH, MD  potassium chloride  SA (KLOR-CON  M) 20 MEQ tablet TAKE 1 TABLET EVERY DAY 08/11/24   Metheney, [name] D, MD  rosuvastatin  (CRESTOR ) 20 MG tablet TAKE 1 TABLET EVERY DAY 11/03/24   Pietro Redell RAMAN, MD  SPIRIVA  RESPIMAT 1.25 MCG/ACT AERS INHALE 2 PUFFS INTO THE LUNGS DAILY. 01/02/24 06/20/25  Mannam, Praveen, MD  Tiotropium Bromide-Olodaterol (STIOLTO RESPIMAT ) 2.5-2.5 MCG/ACT AERS Inhale 2 puffs into the lungs daily. 06/20/24   Alvan Dorothyann BIRCH, MD  traMADol  (ULTRAM ) 50 MG tablet Take 1 tablet (50 mg total) by mouth every 12 (twelve) hours as needed for moderate pain (pain score 4-6). 08/15/24   Alvan Dorothyann BIRCH, MD    Allergies: Ace inhibitors, Arnuity ellipta  [fluticasone  furoate], Atorvastatin, Simvastatin , Yellow dyes (non-tartrazine), Celecoxib, Cetirizine hcl, Penicillins, and Sulfonamide derivatives    Review of Systems  Constitutional:  Negative for chills and fever.  HENT:  Negative for ear pain and sore throat.   Eyes:  Negative for pain and visual disturbance.  Respiratory:  Negative for cough and shortness of breath.   Cardiovascular:  Positive for palpitations. Negative for chest pain.  Gastrointestinal:  Negative for abdominal pain and vomiting.  Genitourinary:  Negative for dysuria and hematuria.  Musculoskeletal:  Negative for arthralgias and back pain.  Skin:  Negative for color change and rash.  Neurological:  Negative for seizures and syncope.  All other systems reviewed and are negative.   Updated Vital Signs BP 127/71   Pulse 63   Temp 98.1 F (36.7 C) (Oral)   Resp 12   Ht 5' 3 (1.6 m)   Wt 72.1 kg   SpO2  95%   BMI 28.17 kg/m   Physical Exam Vitals and nursing note reviewed.  Constitutional:      General: She is not in acute distress.    Appearance: She is well-developed.  HENT:     Head: Normocephalic and atraumatic.  Eyes:     Conjunctiva/sclera: Conjunctivae normal.  Cardiovascular:     Rate and Rhythm: Regular rhythm. Tachycardia present.     Heart sounds: No murmur heard. Pulmonary:     Effort: Pulmonary effort is normal. No respiratory distress.     Breath sounds: Normal breath sounds.  Abdominal:     Palpations: Abdomen is soft.     Tenderness: There is no abdominal tenderness.  Musculoskeletal:        General: No swelling.     Cervical back: Neck supple.  Skin:    General: Skin is warm and dry.     Capillary Refill: Capillary refill takes less than 2 seconds.  Neurological:     Mental Status: She is alert.  Psychiatric:        Mood and Affect: Mood normal.     (all labs ordered are listed, but only abnormal results are displayed) Labs Reviewed  BASIC METABOLIC PANEL WITH GFR - Abnormal; Notable for the following components:      Result Value   Glucose, Bld 102 (*)    Creatinine, Ser 1.24 (*)    GFR, Estimated 43 (*)    All other components within normal limits  CBC - Abnormal; Notable for the following components:   RDW 15.7 (*)    All other components within normal limits  TROPONIN T, HIGH SENSITIVITY    EKG: None  Radiology: [facility] Chest Port 1 View Result Date: 11/20/2024 CLINICAL DATA:  Heart fluttering EXAM: PORTABLE CHEST 1 VIEW COMPARISON:  07/01/2024 and older studies. FINDINGS: Cardiac silhouette top-normal in size. No mediastinal or hilar masses. Lungs are hyperexpanded. Prominent interstitial markings, stable from prior studies. No evidence of pneumonia or pulmonary edema. No pleural effusion or pneumothorax. Skeletal structures are grossly intact. IMPRESSION: No active disease. Electronically Signed   By: Alm Parkins M.D.   On: 11/20/2024 14:57      .Critical Care  Performed by: Elnor Bernarda SQUIBB, DO Authorized by: Elnor Bernarda SQUIBB, DO   Critical care provider statement:    Critical care time (minutes):  108   Critical care was necessary to treat or prevent imminent or life-threatening deterioration of the following conditions:  Cardiac failure   Critical care was time spent personally by me on the following activities:  Development of treatment plan with patient or surrogate, discussions with consultants, evaluation of patient's response to treatment, examination of patient, ordering and review of laboratory studies, ordering and review of radiographic studies, ordering and performing treatments and interventions, pulse oximetry, re-evaluation of patient's condition and review of old charts   Care discussed with: admitting provider      Medications Ordered in the ED  metoprolol  tartrate (LOPRESSOR ) injection  5 mg (5 mg Intravenous Given 11/20/24 1434)                                    Medical Decision Making Amount and/or Complexity of Data Reviewed Labs: ordered. Radiology: ordered.  Risk Prescription drug management. Decision regarding hospitalization.    83 year old female with past medical history of hypertension, hyperlipidemia, congestive heart failure, chronic atrial fibrillation with an ablation in 2024 on Cardizem  and Eliquis  presenting for complaints of palpitations.  Patient presented in atrial flutter with a rate of 180 bpm.  She is currently awake and alert with a stable blood pressure of 135/119.   Patient appears to be coming in and out of atrial flutter and into sinus rhythm.  EKGs have been recorded from both to be seen in [facility].  Spoke with our on-call cardiology team and they recommend short acting beta-blocker as well as increasing diltiazem  dose.  We have given 5 mg of IV metoprolol  during a A-flutter episode.  Current heart rate is 58 in sinus rhythm.  No electrolyte abnormalities.  3:24 PM Patient  now having episodes of bradycardia with heart rate running in the 30s with associated lightheadedness and dizziness.  She is also having very short episodes of asystole that lasts just seconds before resolving going back into sinus rhythm with a rate of 70 bpm.  The episodes of heart rate in the 30s also just last seconds before resolving.  I reconsulted our cardiology team who recommends admission with them on primary.  No medications at this time.  States patient will likely need a pacemaker.  Patient agreeable to plan.  Admit admit orders placed.  Blood pressure remained stable at this time at 127/71.        Final diagnoses:  Atrial flutter, unspecified type [facility])    ED Discharge Orders     None          Elnor Bernarda SQUIBB, DO 11/20/24 1524    Elnor Bernarda SQUIBB, DO 11/20/24 1524

## 2024-11-20 NOTE — ED Triage Notes (Signed)
 Pt reports heart fluttering back on Nov 18. Pt saw Pulmonologist the next day and reports PCP had recently adjust meds that could cause heart fluttering. Pt reports fluttering returned yesterday.

## 2024-11-20 NOTE — H&P (Signed)
 Cardiology Admission History and Physical   Patient ID: Rebekah Harrell MRN: [id number]; DOB: 18-Jan-1941   Admission date: 11/20/2024  PCP:  Alvan Dorothyann BIRCH, MD   [facility] Providers Cardiologist:  Redell Shallow, MD  Electrophysiologist:  Will Gladis Norton, MD      Chief Complaint:  palpitations and intermittent near syncope  Patient Profile: Rebekah Harrell is a 83 y.o. female with atypical chest pain with negative Myoview in 11/2023, persistent atrial fibrillation s/p ablation in 11/2023 on Eliquis , chronic diastolic CHF, mild to moderate MR, hypertension, hyperlipidemia, CKD stage III, COPD, and GERD  who is being seen 11/20/2024 for the evaluation of tachy-brady syndrome.  History of Present Illness: Rebekah Harrell is a 83 year old female with the above history who is followed by Dr. Shallow and Dr. Norton.  She has a history of persistent atrial fibrillation which was initially diagnosed in 2017.  She was started on sotalol  and converted to sinus rhythm.  She was noted to be back in atrial fibrillation in 04/2023 and underwent DCCV in 05/2023 followed by an atrial fibrillation ablation in 11/2023.  Sotalol  subsequently had to be stopped during an admission at [facility] in 11/2023 due to prolonged QTc.  She called our after-hours line today and reported palpitations that she described as a fluttering sensation for the past 2 days with variable heart rates and BP as well as intermittent episodes of a flushed sensation and near syncope.  She was advised to go to the ED for further evaluation.  Upon arrival to the [facility] ED, she was noted to be in atrial flutter with rates in the 160s to 170s.  He was given a dose of IV Lopressor  and converted to sinus rhythm was rates in the high 50s.  However, she subsequently became bradycardic with rates as low as the 30s.  During th's episode of bradycardia, she became really flushed and hot with near syncope similar to the  symptoms she experienced at home.  Therefore, she was transferred to [facility] for admission or evaluation and management of tachybradycardia syndrome.  Upon arrival to [facility], patient is resting comfortably in no acute distress.  She states she had an episode of fluttering a couple weeks ago that lasted for a couple hours but otherwise has been doing okay until the last 2 days when the symptoms started.  She has chronic dyspnea due to her COPD but this is stable.  No chest pain, new or worsening orthopnea, PND, edema, or overt syncope.  While I was in the room, she was going in and out of a atrial fibrillation with RVR and was mildly bradycardic in the 40s to 50s at times.  However, she was asymptomatic with this.  Initial work-up in the ED: Initial troponin negative. WBC 10.1, Hgb 14.1, Plts 15.7. Na 139, K 4.0, Glucose 102, BUN 18, Cr 1.24. Chest x-ray showed no acute findings.  Past Medical History:  Diagnosis Date   Allergy     Aortic atherosclerosis    Arthritis    lower back; knees (11/26/2016)   Asthma    Chronic atrial fibrillation (HCC)    CKD (chronic kidney disease), stage III (HCC)    patient unaware of this on 11/26/2016   Complication of anesthesia    had trouble waking me up in the late 1990's after gallbladder OR   COPD (chronic obstructive pulmonary disease) (HCC)    GERD (gastroesophageal reflux disease)    Heart murmur    dx'd  when I was a little kid   History of hiatal hernia    Hyperlipidemia    Hypertension    Hypoglycemia    Past Surgical History:  Procedure Laterality Date   ATRIAL FIBRILLATION ABLATION N/A 12/08/2023   Procedure: ATRIAL FIBRILLATION ABLATION;  Surgeon: Inocencio Soyla Lunger, MD;  Location: [facility] INVASIVE CV LAB;  Service: Cardiovascular;  Laterality: N/A;   CARDIOVERSION N/A 06/10/2023   Procedure: CARDIOVERSION;  Surgeon: Kate Lonni CROME, MD;  Location: [facility] INVASIVE CV LAB;  Service: Cardiovascular;  Laterality: N/A;    CHOLECYSTECTOMY OPEN  1990s   TONSILLECTOMY     TUBAL LIGATION       Medications Prior to Admission: Prior to Admission medications   Medication Sig Start Date End Date Taking? Authorizing Provider  CALCIUM  PO Take 1 tablet by mouth at bedtime.    [provider]  Cholecalciferol 25 MCG (1000 UT) tablet Take 1,000 Units by mouth daily.    [provider]  dapagliflozin  propanediol (FARXIGA ) 10 MG TABS tablet Take 1 tablet (10 mg total) by mouth daily before breakfast. 02/11/24   Kateryn Marasigan E, PA-C  diltiazem  (CARDIZEM  CD) 120 MG 24 hr capsule Take 1 capsule (120 mg total) by mouth daily. 06/09/24   Jabari Swoveland E, PA-C  ELIQUIS  5 MG TABS tablet TAKE 1 TABLET TWICE DAILY 06/08/24   Pietro Redell RAMAN, MD  famotidine  (PEPCID ) 20 MG tablet TAKE 1 TABLET AT BEDTIME 10/24/24   Alvan Dorothyann BIRCH, MD  furosemide  (LASIX ) 20 MG tablet Take 1 tablet (20 mg total) by mouth daily. 06/09/24   Eino Whitner E, PA-C  hydroxychloroquine  (PLAQUENIL ) 200 MG tablet Take 200 mg by mouth daily. Patient not taking: Reported on 11/09/2024 04/01/24   [provider]  Magnesium  200 MG TABS Take 1 tablet (200 mg total) by mouth daily. 12/10/16   Dow Arland BROCKS, NP  montelukast  (SINGULAIR ) 10 MG tablet Take 1 tablet (10 mg total) by mouth at bedtime. 07/13/24   Alvan Dorothyann BIRCH, MD  Multiple Vitamins-Minerals (MULTIVITAMIN PO) Take 1 tablet by mouth at bedtime.    [provider]  omeprazole  (PRILOSEC) 20 MG capsule TAKE 1 CAPSULE EVERY DAY 03/16/24   Alvan Dorothyann BIRCH, MD  potassium chloride  SA (KLOR-CON  M) 20 MEQ tablet TAKE 1 TABLET EVERY DAY 08/11/24   Alvan Dorothyann BIRCH, MD  rosuvastatin  (CRESTOR ) 20 MG tablet TAKE 1 TABLET EVERY DAY 11/03/24   Pietro Redell RAMAN, MD  SPIRIVA  RESPIMAT 1.25 MCG/ACT AERS INHALE 2 PUFFS INTO THE LUNGS DAILY. 01/02/24 06/20/25  Mannam, Praveen, MD  Tiotropium Bromide-Olodaterol (STIOLTO RESPIMAT ) 2.5-2.5 MCG/ACT AERS Inhale 2 puffs  into the lungs daily. 06/20/24   Alvan Dorothyann BIRCH, MD  traMADol  (ULTRAM ) 50 MG tablet Take 1 tablet (50 mg total) by mouth every 12 (twelve) hours as needed for moderate pain (pain score 4-6). 08/15/24   Alvan Dorothyann BIRCH, MD     Allergies:    Allergies  Allergen Reactions   Ace Inhibitors Swelling   Arnuity Ellipta  [Fluticasone  Furoate] Other (See Comments)    Voice hoarseness   Atorvastatin Other (See Comments)    Myalgias   Simvastatin      Myalgia    Yellow Dyes (Non-Tartrazine) Other (See Comments)    Allergy  test   Celecoxib Other (See Comments)    hair loss   Cetirizine Hcl Other (See Comments)    fatigue   Penicillins Other (See Comments)    Unknown childhood reaction    Sulfonamide Derivatives Rash  Social History:   Social History   Socioeconomic History   Marital status: Divorced    Spouse name: Not on file   Number of children: 3   Years of education: 14   Highest education level: Some college, no degree  Occupational History   Occupation: stages houses for sale    Comment: self employed   Occupation: Retired.  Tobacco Use   Smoking status: Never    Passive exposure: Past   Smokeless tobacco: Never  Vaping Use   Vaping status: Never Used  Substance and Sexual Activity   Alcohol use: Not Currently   Drug use: No   Sexual activity: Not Currently  Other Topics Concern   Not on file  Social History Narrative   Lives alone. One son lives close by incase she needs anything. She enjoys crafting and decorating.    Social Drivers of Health   Financial Resource Strain: Medium Risk (06/16/2024)   Overall Financial Resource Strain (CARDIA)    Difficulty of Paying Living Expenses: Somewhat hard  Food Insecurity: No Food Insecurity (11/20/2024)   Hunger Vital Sign    Worried About Running Out of Food in the Last Year: Never true    Ran Out of Food in the Last Year: Never true  Transportation Needs: No Transportation Needs (11/20/2024)   PRAPARE -  Administrator, Civil Service (Medical): No    Lack of Transportation (Non-Medical): No  Physical Activity: Insufficiently Active (06/16/2024)   Exercise Vital Sign    Days of Exercise per Week: 1 day    Minutes of Exercise per Session: 60 min  Stress: Stress Concern Present (06/16/2024)   [employer] of Occupational Health - Occupational Stress Questionnaire    Feeling of Stress: To some extent  Social Connections: Moderately Isolated (11/20/2024)   Social Connection and Isolation Panel    Frequency of Communication with Friends and Family: More than three times a week    Frequency of Social Gatherings with Friends and Family: Once a week    Attends Religious Services: Never    Database Administrator or Organizations: No    Attends Engineer, Structural: More than 4 times per year    Marital Status: Divorced  Catering Manager Violence: Not At Risk (11/20/2024)   Humiliation, Afraid, Rape, and Kick questionnaire    Fear of Current or Ex-Partner: No    Emotionally Abused: No    Physically Abused: No    Sexually Abused: No     Family History:   The patient's family history includes Atrial fibrillation in her brother; CAD in her mother; Colon cancer in her father; Emphysema in her maternal grandfather; Healthy in her brother; Heart attack in her paternal grandmother; Heart failure in her maternal grandmother; Kidney failure in her maternal grandfather.    ROS:  Please see the history of present illness.     Physical Exam/Data: Vitals:   11/20/24 1515 11/20/24 1545 11/20/24 1653 11/20/24 1657  BP: 127/71 (!) 144/68  (!) 149/106  Pulse: 63 65  79  Resp: 12 10  17   Temp:    98.2 F (36.8 C)  TempSrc:    Oral  SpO2: 95% 96%  92%  Weight:   71.8 kg   Height:   5' 3 (1.6 m)    No intake or output data in the 24 hours ending 11/20/24 1826    11/20/2024    4:53 PM 11/20/2024    2:03 PM 11/09/2024    3:06  PM  Last 3 Weights  Weight (lbs) 158 lb 4.6 oz  159 lb 159 lb  Weight (kg) 71.8 kg 72.122 kg 72.122 kg     Body mass index is 28.04 kg/m.  General: 83 y.o. Caucasian female resting comfortably in no acute distress. HEENT: Normocephalic and atraumatic. Sclera clear. EOMs intact. Neck: Supple. No carotid bruits. No JVD. Heart: RRR. III/VI systolic murmur.  Lungs: No increased work of breathing. Clear to ausculation bilaterally. No wheezes, rhonchi, or rales.  Extremities: No lower extremity edema.   Skin: Warm and dry. Neuro: Alert and oriented x3. No focal deficits. Psych: Normal affect. Responds appropriately.  EKG:  The ECG that was done  was personally reviewed and demonstrates atrial flutter, rate 169, with RBBB and LPFB as well as some diffuse ST depressions likely rate related.  Relevant CV Studies:  Echocardiogram 12/11/2023 ([facility]): Impressions: Left Ventricle: Systolic function is high normal to borderline  hyperdynamic. EF: 67- 70%.    Left Ventricle: Wall motion is normal.    Left Ventricle: Doppler parameters consistent with moderate diastolic  dysfunction and elevated LA pressure.    Mitral Valve: There is severe annular calcification.    Mitral Valve: Mitral valve structure is normal. The leaflets are mildly  thickened and exhibit normal excursion.    Mitral Valve: There is mild regurgitation.    Aortic Valve: The aortic valve is tricuspid. The leaflets are mildly  thickened and exhibit normal excursion. The leaflets are mildly calcified.    Aortic Valve: Mild aortic valve regurgitation.    Aortic Valve: There is mild stenosis.  Peak velocity 2.8 m/s.  Peak  gradient 31 mmHg.  Aortic valve area by planimetry 1.8 cm.  Dimensionless  index 0.38.    Left Atrium: Left atrium is moderately dilated at 4.900 cm. Left atrium  volume index is severely increased (>48 mL/m2).    Tricuspid Valve: The right ventricular systolic pressure is mildly  elevated - 44 mmHg.    Tricuspid Valve: There is mild regurgitation. TR jet  velocity 3.2  m/sec.  _______________   Pablo 12/22/2023 ([facility]): Impression: Ejection fraction calculates to 51%. No ischemia     Laboratory Data: High Sensitivity Troponin:  No results for input(s): TROPONINIHS in the last 720 hours.    Chemistry Recent Labs  Lab 11/20/24 1413  NA 139  K 4.0  CL 102  CO2 24  GLUCOSE 102*  BUN 18  CREATININE 1.24*  CALCIUM  9.8  GFRNONAA 43*  ANIONGAP 14    No results for input(s): PROT, ALBUMIN, AST, ALT, ALKPHOS, BILITOT in the last 168 hours. Lipids No results for input(s): CHOL, TRIG, HDL, LABVLDL, LDLCALC, CHOLHDL in the last 168 hours. Hematology Recent Labs  Lab 11/20/24 1413  WBC 10.1  RBC 5.08  HGB 14.1  HCT 43.2  MCV 85.0  MCH 27.8  MCHC 32.6  RDW 15.7*  PLT 263   Thyroid  No results for input(s): TSH, FREET4 in the last 168 hours. BNPNo results for input(s): BNP, PROBNP in the last 168 hours.  DDimer No results for input(s): DDIMER in the last 168 hours.  Radiology/Studies:  DG Chest Port 1 View Result Date: 11/20/2024 CLINICAL DATA:  Heart fluttering EXAM: PORTABLE CHEST 1 VIEW COMPARISON:  07/01/2024 and older studies. FINDINGS: Cardiac silhouette top-normal in size. No mediastinal or hilar masses. Lungs are hyperexpanded. Prominent interstitial markings, stable from prior studies. No evidence of pneumonia or pulmonary edema. No pleural effusion or pneumothorax. Skeletal structures are grossly intact. IMPRESSION: No active  disease. Electronically Signed   By: Alm Parkins M.D.   On: 11/20/2024 14:57     Assessment and Plan:  Atrial Flutter with RVR History of Persistent Atrial Fibrillation Bradycardia Tachy-Brady Syndrome Patient has a history of atrial fibrillation which was initially diagnosed in 2017.  She underwent an ablation in 11/2023.  She was previously on sotalol  but this was stopped after her ablation due to prolonged QTc.  She presented with palpitations  associated near syncope and was noted to be in a looks like atrial flutter with rates of almost 170 bpm.  She was given a dose of IV Lopressor  and converted to sinus rhythm with rates in the high 50s with subsequent likely became bradycardic with rates in the 30s.  During episode of bradycardia, she felt really hot and flushed with near syncope similar to the symptoms she was having at home. - Currently going in and out of atrial fibrillation with rates occasionally in the 40s to 50s when in sinus rhythm.  However, she is asymptomatic with this. - Will update Echo. - Potassium 4.0 on admission.  Will check magnesium  and TSH. - Will hold home Diltiazem  (last dose this morning) and will avoid AV nodal agents.  - She is on chronic anticoagulation with Eliquis  at home (dose this morning).  Will hold this for now in case pacemaker is needed.  - Will ask EP to see tomorrow morning for tachybradycardia syndrome and possible maker.  Will make NPO at midnight just in case.   Chronic Diastolic CHF Last Echo in 11/2023 showed LVEF of 67-70% with normal wall motion and moderate diastolic dysfunction as well as mild AS/ AI and mild MR.  - She has chronic dyspnea on exertion but this is stable. Appears euvolemic.  - Continue Lasix  20mg  daily.  - Will hold Farxiga  in case she needs a pacemaker this admission.    Mild to Moderate Mitral Regurgitation  Mild Aortic Stenosis/ Insufficiency Echo in 05/2023 showed moderate MR; however, most recent Echo in 11/2023 at [facility] showed only mild MR as well as mild AS/ AI. - She was due to have a repeat Echo tomorrow as an outpatient for routine monitoring of this. Can do this while inpatient.    Hypertension BP has been somewhat labile since arriving in the ED.  - Will hold home Diltiazem  given bradycardia.  - Will continue to monitor BP and can adjust medications as needed. Hyperlipidemia Lipid panel in 06/2024: Total Cholesterol 125, Triglycerides 124, HDL 47, LDL 56.   - Continue Crestor  20mg  daily    CKD Stage III Creatinine 1.24 on admission. Baseline around 0.9 to 1.2.   COPD Continue home inhalers. She states she is using the Stiolto inhaler not the Spiriva  inhaler.   Risk Assessment/Risk Scores:   New York  Heart Association (NYHA) Functional Class NYHA Class II-III   CHA2DS2-VASc Score = 6  This indicates a 9.7% annual risk of stroke. The patient's score is based upon: CHF History: 1 HTN History: 1 Diabetes History: 0 Stroke History: 0 Vascular Disease History: 1 (calcifications noted on CT) Age Score: 2 Gender Score: 1   Code Status: Full Code  Severity of Illness: The appropriate patient status for this patient is INPATIENT. Inpatient status is judged to be reasonable and necessary in order to provide the required intensity of service to ensure the patient's safety. The patient's presenting symptoms, physical exam findings, and initial radiographic and laboratory data in the context of their chronic comorbidities is felt to place  them at high risk for further clinical deterioration. Furthermore, it is not anticipated that the patient will be medically stable for discharge from the hospital within 2 midnights of admission.   * I certify that at the point of admission it is my clinical judgment that the patient will require inpatient hospital care spanning beyond 2 midnights from the point of admission due to high intensity of service, high risk for further deterioration and high frequency of surveillance required.*   For questions or updates, please contact [facility] Please consult [email or web address] for contact info under    Signed, Francesco Provencal E Lakya Schrupp, PA-C  11/20/2024 6:26 PM

## 2024-11-20 NOTE — Telephone Encounter (Signed)
   Patient called after-hours answering service with concerns about palpitations.  Called and spoke with patient.  In very familiar with this patient have seen her multiple times in the clinic.  She has a history of atrial fibrillation and reports fluttering for the past 2 days.  She states her heart rate and her BP are all over the place with heart rate ranging from the 80s to 170s and systolic BP as low as 102 at times.  She also reports that she feels like she is coming close to passing out at times.  Recommend she go to the ED for further evaluation.  Advised her not to drive herself.  Patient voiced understanding and agreed.  Zeek Rostron E Giamarie Bueche, PA-C 11/20/2024 1:10 PM

## 2024-11-20 NOTE — ED Notes (Signed)
 Pt's monitor was alarming, so I checked on her. She advised she got really flushed and hot and her chest was tight. Pt was noted to be in a significant bradycardic rhythm in the 20s and 30s for 5-6 second straight. Will add strips to chart. Notified provider, concerned for Metoprolol  working too effectively at present.

## 2024-11-20 NOTE — Plan of Care (Signed)

## 2024-11-20 NOTE — Plan of Care (Signed)
   Problem: Health Behavior/Discharge Planning: Goal: Ability to manage health-related needs will improve Outcome: Progressing   Problem: Clinical Measurements: Goal: Ability to maintain clinical measurements within normal limits will improve Outcome: Progressing Goal: Diagnostic test results will improve Outcome: Progressing

## 2024-11-20 NOTE — Progress Notes (Signed)
 RN received patient from [facility]. This RN did not receive report. When changing the patients gown, this RN and tech saw the tourniquet was still tied to patients left arm above IV. This RN removed tourniquet and assessed patient.

## 2024-11-21 ENCOUNTER — Other Ambulatory Visit (HOSPITAL_COMMUNITY): Payer: Self-pay

## 2024-11-21 ENCOUNTER — Inpatient Hospital Stay (HOSPITAL_COMMUNITY)

## 2024-11-21 ENCOUNTER — Telehealth (HOSPITAL_COMMUNITY): Payer: Self-pay

## 2024-11-21 ENCOUNTER — Ambulatory Visit (HOSPITAL_BASED_OUTPATIENT_CLINIC_OR_DEPARTMENT_OTHER)

## 2024-11-21 DIAGNOSIS — I4819 Other persistent atrial fibrillation: Secondary | ICD-10-CM | POA: Diagnosis not present

## 2024-11-21 DIAGNOSIS — R001 Bradycardia, unspecified: Secondary | ICD-10-CM | POA: Diagnosis not present

## 2024-11-21 DIAGNOSIS — I495 Sick sinus syndrome: Secondary | ICD-10-CM | POA: Diagnosis not present

## 2024-11-21 DIAGNOSIS — I452 Bifascicular block: Secondary | ICD-10-CM

## 2024-11-21 DIAGNOSIS — I4892 Unspecified atrial flutter: Secondary | ICD-10-CM | POA: Diagnosis not present

## 2024-11-21 LAB — BASIC METABOLIC PANEL WITH GFR
Anion gap: 9 (ref 5–15)
BUN: 18 mg/dL (ref 8–23)
CO2: 22 mmol/L (ref 22–32)
Calcium: 8.8 mg/dL — ABNORMAL LOW (ref 8.9–10.3)
Chloride: 108 mmol/L (ref 98–111)
Creatinine, Ser: 1.27 mg/dL — ABNORMAL HIGH (ref 0.44–1.00)
GFR, Estimated: 42 mL/min — ABNORMAL LOW (ref >60)
Glucose, Bld: 101 mg/dL — ABNORMAL HIGH (ref 70–99)
Potassium: 4 mmol/L (ref 3.5–5.1)
Sodium: 139 mmol/L (ref 135–145)

## 2024-11-21 LAB — ECHOCARDIOGRAM COMPLETE
Area-P 1/2: 4.04 cm2
Height: 63 in
S' Lateral: 2.8 cm
Weight: 2532.64 [oz_av]

## 2024-11-21 MED ORDER — ONDANSETRON HCL 4 MG/2ML IJ SOLN
4.0000 mg | Freq: Once | INTRAMUSCULAR | Status: DC
  Filled 2024-11-21: qty 2

## 2024-11-21 MED ORDER — AMIODARONE LOAD VIA INFUSION
150.0000 mg | Freq: Once | INTRAVENOUS | Status: AC
  Administered 2024-11-21: 150 mg via INTRAVENOUS
  Filled 2024-11-21: qty 83.34

## 2024-11-21 MED ORDER — APIXABAN 5 MG PO TABS
5.0000 mg | ORAL_TABLET | Freq: Two times a day (BID) | ORAL | Status: DC
  Administered 2024-11-21 – 2024-11-23 (×5): 5 mg via ORAL
  Filled 2024-11-21 (×5): qty 1

## 2024-11-21 MED ORDER — AMIODARONE HCL IN DEXTROSE 360-4.14 MG/200ML-% IV SOLN
60.0000 mg/h | INTRAVENOUS | Status: AC
  Administered 2024-11-21: 60 mg/h via INTRAVENOUS
  Filled 2024-11-21: qty 200

## 2024-11-21 MED ORDER — HYDROCORTISONE 1 % EX CREA
1.0000 | TOPICAL_CREAM | Freq: Three times a day (TID) | CUTANEOUS | Status: DC | PRN
  Filled 2024-11-21: qty 28

## 2024-11-21 MED ORDER — AMIODARONE HCL IN DEXTROSE 360-4.14 MG/200ML-% IV SOLN
60.0000 mg/h | INTRAVENOUS | Status: DC
  Administered 2024-11-21: 30 mg/h via INTRAVENOUS
  Administered 2024-11-22 – 2024-11-23 (×3): 60 mg/h via INTRAVENOUS
  Filled 2024-11-21 (×4): qty 200

## 2024-11-21 NOTE — Consult Note (Signed)
 Cardiology Consultation   Patient ID: Rebekah Harrell MRN: [id number]; DOB: [birth year]  Admit date: 11/20/2024 Date of Consult: 11/21/2024  PCP:  Alvan Dorothyann BIRCH, MD   [facility] Providers Cardiologist:  Redell Shallow, MD  Electrophysiologist:  Will Gladis Norton, MD    Patient Profile: Rebekah Harrell is a 83 y.o. female with a hx of  HTN, HLD, CKD (III), COPD AFib who is being seen 11/21/2024 for the evaluation of tachy-brady at the request of Hilty.  AFib hx Diagnosed 2017 Sotalol  AFib and CTI ablation 12/08/23 Post ablation: she was admitted 12/19 - 12/23/22 to [facility]. She had not been taking her lasix  post ablation and she developed shortness of breath. She was profoundly hypoxic on presentation 69% on RA. She was admitted with bilateral infiltrates, small pleural effusions and was treated as PNA. Her sotalol  was stopped with QTc of 491 ms and she was started on Toprol  XL. She had a negative stress test while inpatient. She was discharged on O2   History of Present Illness: [name] last saw EP 03/16/24, post ablation visit, off sotalol  (noted above) with no symptoms of AFib, maintained on Eliquis  Recovered well from PNA, mentions hiatal hernia > slept upright for years  Seeing cardiology team a few times since then, last 10/23, had some struggles with HTN ooc, reported difficulty w/breathing 2/2 nasal/deviated septum it seems. Some brief flutters, suspected Afib x2 since her ablation, better on dilt )then metoprolol ) Having issues with diverticulitis and back pain.  She called yesterday 11/20/24  with c/o palpitations abd near syncope, with widely variable BPs and HRs > advised ER  She presented to [facility] and was noted to be in atrial flutter with heart rates in the 160s. She was given a single dose of IV Lopressor  and ultimately went back to sinus rhythm with heart rates in the high 50s. She remained stable in sinus rhythm for a  period of time and then developed bradycardia with heart rates in the 30s and had a 3-second asystolic pause. She was symptomatic with this pause.  Noting bot post conversion pauses and sinus > sinus pauses With report of baseline bifascicular block concerns of tachy-brady and advised to [facility] > admission and eventual EP eval and consideration for PPM With baseline conduction system disease, evidence of SN dysfunction   LABS K+ 4.0 BUN/Creat 18/1.24 > 1.27 Mag 1.9 HS trop I: 24, 26 HS Trop T: 16 WBC 10.2 H/H 14/43 Plts 263 TSH 2.431  Home diltiazem  120mg  daily held on admission as well as her Eliquis    She reports post ablation having some brief flutters on occasion, though o late more, faster, pounding making her feel poorly, some lasting a couple hours, some with moments that she felt suddenly lightheaded, fainty She was recently changed from [place] to [place] and the timing of her palpitations seems t coincide with that but does feel like it works better for her COPD  Over the weekend, her HR and BP were all over the place, feeling worse and reached out    Past Medical History:  Diagnosis Date   Allergy     Aortic atherosclerosis    Arthritis    lower back; knees (11/26/2016)   Asthma    Chronic atrial fibrillation (HCC)    CKD (chronic kidney disease), stage III (HCC)    patient unaware of this on 11/26/2016   Complication of anesthesia    had trouble waking me up in the late 1990's after  gallbladder OR   COPD (chronic obstructive pulmonary disease) (HCC)    GERD (gastroesophageal reflux disease)    Heart murmur    dx'd when I was a little kid   History of hiatal hernia    Hyperlipidemia    Hypertension    Hypoglycemia     Past Surgical History:  Procedure Laterality Date   ATRIAL FIBRILLATION ABLATION N/A 12/08/2023   Procedure: ATRIAL FIBRILLATION ABLATION;  Surgeon: Inocencio Soyla Lunger, MD;  Location: [facility] INVASIVE CV LAB;  Service: Cardiovascular;   Laterality: N/A;   CARDIOVERSION N/A 06/10/2023   Procedure: CARDIOVERSION;  Surgeon: Kate Lonni CROME, MD;  Location: [facility] INVASIVE CV LAB;  Service: Cardiovascular;  Laterality: N/A;   CHOLECYSTECTOMY OPEN  1990s   TONSILLECTOMY     TUBAL LIGATION       Home Medications:  Prior to Admission medications   Medication Sig Start Date End Date Taking? Authorizing Provider  CALCIUM  PO Take 1 tablet by mouth at bedtime.    [provider]  Cholecalciferol 25 MCG (1000 UT) tablet Take 1,000 Units by mouth daily.    [provider]  dapagliflozin  propanediol (FARXIGA ) 10 MG TABS tablet Take 1 tablet (10 mg total) by mouth daily before breakfast. 02/11/24   Goodrich, Callie E, PA-C  diltiazem  (CARDIZEM  CD) 120 MG 24 hr capsule Take 1 capsule (120 mg total) by mouth daily. 06/09/24   Goodrich, Callie E, PA-C  ELIQUIS  5 MG TABS tablet TAKE 1 TABLET TWICE DAILY 06/08/24   Pietro Redell RAMAN, MD  famotidine  (PEPCID ) 20 MG tablet TAKE 1 TABLET AT BEDTIME 10/24/24   Alvan Dorothyann BIRCH, MD  furosemide  (LASIX ) 20 MG tablet Take 1 tablet (20 mg total) by mouth daily. 06/09/24   Goodrich, Callie E, PA-C  hydroxychloroquine  (PLAQUENIL ) 200 MG tablet Take 200 mg by mouth daily. Patient not taking: Reported on 11/09/2024 04/01/24   [provider]  Magnesium  200 MG TABS Take 1 tablet (200 mg total) by mouth daily. 12/10/16   Dow Arland BROCKS, NP  montelukast  (SINGULAIR ) 10 MG tablet Take 1 tablet (10 mg total) by mouth at bedtime. 07/13/24   Alvan Dorothyann BIRCH, MD  Multiple Vitamins-Minerals (MULTIVITAMIN PO) Take 1 tablet by mouth at bedtime.    [provider]  omeprazole  (PRILOSEC) 20 MG capsule TAKE 1 CAPSULE EVERY DAY 03/16/24   Alvan Dorothyann BIRCH, MD  potassium chloride  SA (KLOR-CON  M) 20 MEQ tablet TAKE 1 TABLET EVERY DAY 08/11/24   Alvan Dorothyann BIRCH, MD  rosuvastatin  (CRESTOR ) 20 MG tablet TAKE 1 TABLET EVERY DAY 11/03/24   Pietro Redell RAMAN, MD  SPIRIVA   RESPIMAT 1.25 MCG/ACT AERS INHALE 2 PUFFS INTO THE LUNGS DAILY. 01/02/24 06/20/25  Mannam, Praveen, MD  Tiotropium Bromide-Olodaterol (STIOLTO RESPIMAT ) 2.5-2.5 MCG/ACT AERS Inhale 2 puffs into the lungs daily. 06/20/24   Alvan Dorothyann BIRCH, MD  traMADol  (ULTRAM ) 50 MG tablet Take 1 tablet (50 mg total) by mouth every 12 (twelve) hours as needed for moderate pain (pain score 4-6). 08/15/24   Alvan Dorothyann BIRCH, MD    Scheduled Meds:  arformoterol  15 mcg Nebulization BID   And   umeclidinium bromide  1 puff Inhalation Daily   famotidine   20 mg Oral QHS   furosemide   20 mg Oral Daily   montelukast   10 mg Oral QHS   pantoprazole   40 mg Oral Daily   potassium chloride  SA  20 mEq Oral Daily   rosuvastatin   20 mg Oral Daily   Continuous Infusions:  PRN Meds: acetaminophen , hydrocortisone cream, nitroGLYCERIN, ondansetron  (ZOFRAN ) IV, traMADol   Allergies:    Allergies  Allergen Reactions   Ace Inhibitors Swelling   Arnuity Ellipta  [Fluticasone  Furoate] Other (See Comments)    Voice hoarseness   Atorvastatin Other (See Comments)    Myalgias   Simvastatin  Other (See Comments)    Myalgia    Yellow Dyes (Non-Tartrazine) Other (See Comments)    Allergy  test   Celecoxib Other (See Comments)    hair loss   Cetirizine Hcl Other (See Comments)    fatigue   Penicillins Other (See Comments)    Unknown childhood reaction    Sulfonamide Derivatives Rash    Social History:   Social History   Socioeconomic History   Marital status: Divorced    Spouse name: Not on file   Number of children: 3   Years of education: 14   Highest education level: Some college, no degree  Occupational History   Occupation: stages houses for sale    Comment: self employed   Occupation: Retired.  Tobacco Use   Smoking status: Never    Passive exposure: Past   Smokeless tobacco: Never  Vaping Use   Vaping status: Never Used  Substance and Sexual Activity   Alcohol use: Not Currently   Drug use:  No   Sexual activity: Not Currently  Other Topics Concern   Not on file  Social History Narrative   Lives alone. One son lives close by incase she needs anything. She enjoys crafting and decorating.    Social Drivers of Health   Financial Resource Strain: Medium Risk (06/16/2024)   Overall Financial Resource Strain (CARDIA)    Difficulty of Paying Living Expenses: Somewhat hard  Food Insecurity: No Food Insecurity (11/20/2024)   Hunger Vital Sign    Worried About Running Out of Food in the Last Year: Never true    Ran Out of Food in the Last Year: Never true  Transportation Needs: No Transportation Needs (11/20/2024)   PRAPARE - Administrator, Civil Service (Medical): No    Lack of Transportation (Non-Medical): No  Physical Activity: Insufficiently Active (06/16/2024)   Exercise Vital Sign    Days of Exercise per Week: 1 day    Minutes of Exercise per Session: 60 min  Stress: Stress Concern Present (06/16/2024)   [employer] of Occupational Health - Occupational Stress Questionnaire    Feeling of Stress: To some extent  Social Connections: Moderately Isolated (11/20/2024)   Social Connection and Isolation Panel    Frequency of Communication with Friends and Family: More than three times a week    Frequency of Social Gatherings with Friends and Family: Once a week    Attends Religious Services: Never    Database Administrator or Organizations: No    Attends Engineer, Structural: More than 4 times per year    Marital Status: Divorced  Catering Manager Violence: Not At Risk (11/20/2024)   Humiliation, Afraid, Rape, and Kick questionnaire    Fear of Current or Ex-Partner: No    Emotionally Abused: No    Physically Abused: No    Sexually Abused: No    Family History:   Family History  Problem Relation Age of Onset   CAD Mother        HEART ATTACK   Colon cancer Father        KIDNEY FAILURE/COLON CANCER   Atrial fibrillation Brother    Healthy  Brother    Heart failure  Maternal Grandmother    Kidney failure Maternal Grandfather    Emphysema Maternal Grandfather    Heart attack Paternal Grandmother      ROS:  Please see the history of present illness.  All other ROS reviewed and negative.     Physical Exam/Data: Vitals:   11/20/24 2300 11/21/24 0409 11/21/24 0805 11/21/24 0832  BP:  110/64 119/73   Pulse:  65 69   Resp: 13 16 16    Temp:  98.6 F (37 C) 98 F (36.7 C)   TempSrc:  Oral Oral   SpO2:  97% 92% 93%  Weight:      Height:        Intake/Output Summary (Last 24 hours) at 11/21/2024 0853 Last data filed at 11/20/2024 2100 Gross per 24 hour  Intake 240 ml  Output --  Net 240 ml      11/20/2024    4:53 PM 11/20/2024    2:03 PM 11/09/2024    3:06 PM  Last 3 Weights  Weight (lbs) 158 lb 4.6 oz 159 lb 159 lb  Weight (kg) 71.8 kg 72.122 kg 72.122 kg     Body mass index is 28.04 kg/m.  General:  Well nourished, well developed, in no acute distress HEENT: normal Neck: no JVD Vascular: No carotid bruits; Distal pulses 2+ bilaterally Cardiac:  irreg-irreg, tachycardic; no murmurs, gallops or rubs Lungs:  CTA b/l, no wheezing, rhonchi or rales  Abd: soft, nontender Ext: no edema Musculoskeletal:  No deformities Skin: warm and dry  Neuro:  no focal abnormalities noted Psych:  Normal affect   EKG:  The EKG was personally reviewed and demonstrates:    AFlutter 169bpm (likely atypical), RBBB AFib 97bpm, RBBB  OLD: SR 76bpm, PAC, narrow QRS  Telemetry:  Telemetry was personally reviewed and demonstrates:   ER reviewed AFlutter > SB with fairly short post conversion pauses, transient rates to 30's , longest pause I found was 4.2 sends I find on tracing sinus > sinus pause/brady Landing evebtually into SR 60's  Here: SR >> AFib 80'[s-100's   Relevant CV Studies:  ECHO 05/2023 > LVEF 50-55%, LA/RA moderately dilated  CT Cardiac Morphology 11/2023 > normal pulmonary vein drainage into the LA, the  LAA is a large chicken wing type, CCS 408 / 73rd percentile for matched controls EPS 12/08/23 >  AF on presentation, successful electrical isolation and anatomical encircling of all four pulmonary veins with PFA, posterior wall isolation using PFA, CTI ablation ECHO 12/13/23 ([facility]) > LVEF 67-70%, mild MV regurgitation, mildly thickened AV leaflets  CT Chest w/Contrast 12/11/23 > neg for PE / dissection, minimal coronary atherosclerosis, small pericardial effusion, bilateral infiltrates consistent with PNA Lexiscan NM Stress 12/23/23 > no ischemia, low normal LV systolic function w/EF 51%  Laboratory Data: High Sensitivity Troponin:   Recent Labs  Lab 11/20/24 1822 11/20/24 2028  TROPONINIHS 24* 26*     Chemistry Recent Labs  Lab 11/20/24 1413 11/20/24 1822 11/21/24 0155  NA 139  --  139  K 4.0  --  4.0  CL 102  --  108  CO2 24  --  22  GLUCOSE 102*  --  101*  BUN 18  --  18  CREATININE 1.24*  --  1.27*  CALCIUM  9.8  --  8.8*  MG  --  1.9  --   GFRNONAA 43*  --  42*  ANIONGAP 14  --  9    No results for input(s): PROT, ALBUMIN, AST, ALT, ALKPHOS, BILITOT  in the last 168 hours. Lipids No results for input(s): CHOL, TRIG, HDL, LABVLDL, LDLCALC, CHOLHDL in the last 168 hours.  Hematology Recent Labs  Lab 11/20/24 1413  WBC 10.1  RBC 5.08  HGB 14.1  HCT 43.2  MCV 85.0  MCH 27.8  MCHC 32.6  RDW 15.7*  PLT 263   Thyroid   Recent Labs  Lab 11/20/24 1822  TSH 2.431    BNPNo results for input(s): BNP, PROBNP in the last 168 hours.  DDimer No results for input(s): DDIMER in the last 168 hours.  Radiology/Studies:  DG Chest Port 1 View Result Date: 11/20/2024 CLINICAL DATA:  Heart fluttering EXAM: PORTABLE CHEST 1 VIEW COMPARISON:  07/01/2024 and older studies. FINDINGS: Cardiac silhouette top-normal in size. No mediastinal or hilar masses. Lungs are hyperexpanded. Prominent interstitial markings, stable from prior studies. No evidence of  pneumonia or pulmonary edema. No pleural effusion or pneumothorax. Skeletal structures are grossly intact. IMPRESSION: No active disease. Electronically Signed   By: Alm Parkins M.D.   On: 11/20/2024 14:57     Assessment and Plan:  Persistent AFib AFlutter Hx of PVI/CTI ablation Dec 2024 Home Eliquis  held, last dose was 11/20/24 morning  3.   RBBB New  4.   Tachy-brady In my review the vast majority of brady/pauses were post conversion She has had some progression of conduction system disease  May benefit from PPM, discussed rational for PPM in her clinical scenario Echo is bedside > pending repeat echo Rates 100-120s while I am with her She is NPO  EP MD will see her for final recs     For questions or updates, please contact [facility] Please consult [email or web address] for contact info under   Signed, Charlies Macario Arthur, PA-C  11/21/2024 8:53 AM

## 2024-11-21 NOTE — Plan of Care (Signed)
  Problem: Clinical Measurements: Goal: Will remain free from infection Outcome: Progressing   Problem: Activity: Goal: Risk for activity intolerance will decrease Outcome: Progressing   Problem: Nutrition: Goal: Adequate nutrition will be maintained Outcome: Progressing   Problem: Coping: Goal: Level of anxiety will decrease Outcome: Progressing   Problem: Elimination: Goal: Will not experience complications related to bowel motility Outcome: Progressing Goal: Will not experience complications related to urinary retention Outcome: Progressing   Problem: Pain Managment: Goal: General experience of comfort will improve and/or be controlled Outcome: Progressing   Problem: Safety: Goal: Ability to remain free from injury will improve Outcome: Progressing   Problem: Skin Integrity: Goal: Risk for impaired skin integrity will decrease Outcome: Progressing

## 2024-11-21 NOTE — Progress Notes (Signed)
   11/21/24 1104  Spiritual Encounters  Type of Visit Initial  Care provided to: Patient  Referral source Nurse (RN/NT/LPN)  Reason for visit Advance directives  OnCall Visit No  Spiritual Framework  Presenting Themes Impactful experiences and emotions  Community/Connection Family  Patient Stress Factors Health changes  Family Stress Factors None identified  Interventions  Spiritual Care Interventions Made Compassionate presence;Established relationship of care and support  Intervention Outcomes  Outcomes Awareness of support;Awareness of health   Chaplain met with Pt to discuss the Advance Directive. A copy of the document  was provided for her review. Chaplain explain the purpose of the form and informed the Pt that when she is ready to complete it, she may notify the medical team, who will then contact chaplain services.  Chaplain remains available to assist with the completion of the AD and provide ongoing emotional spiritual support.

## 2024-11-21 NOTE — Plan of Care (Signed)

## 2024-11-21 NOTE — Progress Notes (Signed)
  Echocardiogram 2D Echocardiogram has been performed.  Tinnie FORBES Gosling RDCS 11/21/2024, 10:11 AM

## 2024-11-21 NOTE — Telephone Encounter (Signed)
 Pharmacy Patient Advocate Encounter  Insurance verification completed.    The patient is insured through [place]. Patient has Medicare and is not eligible for a copay card, but may be able to apply for patient assistance or Medicare RX Payment Plan (Patient Must reach out to their plan, if eligible for payment plan), if available.    Ran test claim for Eliquis  5mg  and it is refill too soon until 01/10/25.   This test claim was processed through [facility]- copay amounts may vary at other pharmacies due to pharmacy/plan contracts, or as the patient moves through the different stages of their insurance plan.

## 2024-11-22 DIAGNOSIS — I4819 Other persistent atrial fibrillation: Secondary | ICD-10-CM | POA: Diagnosis not present

## 2024-11-22 LAB — BASIC METABOLIC PANEL WITH GFR
Anion gap: 11 (ref 5–15)
BUN: 20 mg/dL (ref 8–23)
CO2: 21 mmol/L — ABNORMAL LOW (ref 22–32)
Calcium: 9 mg/dL (ref 8.9–10.3)
Chloride: 109 mmol/L (ref 98–111)
Creatinine, Ser: 1.34 mg/dL — ABNORMAL HIGH (ref 0.44–1.00)
GFR, Estimated: 39 mL/min — ABNORMAL LOW (ref >60)
Glucose, Bld: 113 mg/dL — ABNORMAL HIGH (ref 70–99)
Potassium: 4.9 mmol/L (ref 3.5–5.1)
Sodium: 141 mmol/L (ref 135–145)

## 2024-11-22 NOTE — Progress Notes (Signed)
 Rounding Note   Patient Name: Rebekah Harrell Date of Encounter: 11/22/2024  [facility] Cardiologist: Redell Shallow, MD   Subjective  Aware of AFib, makes her feel tired, poorly  Scheduled Meds:  apixaban   5 mg Oral BID   arformoterol  15 mcg Nebulization BID   And   umeclidinium bromide  1 puff Inhalation Daily   famotidine   20 mg Oral QHS   furosemide   20 mg Oral Daily   montelukast   10 mg Oral QHS   pantoprazole   40 mg Oral Daily   potassium chloride  SA  20 mEq Oral Daily   rosuvastatin   20 mg Oral Daily   Continuous Infusions:  amiodarone 60 mg/hr (11/22/24 0655)   PRN Meds: acetaminophen , hydrocortisone cream, nitroGLYCERIN, traMADol    Vital Signs  Vitals:   11/21/24 2253 11/22/24 0415 11/22/24 0719 11/22/24 0736  BP: 119/70 119/83  105/71  Pulse: 75 67 94 68  Resp: 18 20 16 16   Temp: 97.8 F (36.6 C) 97.6 F (36.4 C)  98 F (36.7 C)  TempSrc: Oral Oral  Oral  SpO2: 95% 95% 96% 95%  Weight:      Height:        Intake/Output Summary (Last 24 hours) at 11/22/2024 0941 Last data filed at 11/22/2024 0700 Gross per 24 hour  Intake 1550.31 ml  Output --  Net 1550.31 ml      11/20/2024    4:53 PM 11/20/2024    2:03 PM 11/09/2024    3:06 PM  Last 3 Weights  Weight (lbs) 158 lb 4.6 oz 159 lb 159 lb  Weight (kg) 71.8 kg 72.122 kg 72.122 kg      Telemetry  AFib mostly 90's, some towards 110's - Personally Reviewed  ECG   No new EKGs - Personally Reviewed  Physical Exam  Seen and examined this morning by Dr. Almetta, unchanged from yesterday GEN: No acute distress.   Neck: No JVD Cardiac: irreg-irreg, no murmurs, rubs, or gallops.  Respiratory: CTA b/l. GI: Soft, nontender, non-distended  MS: No edema; No deformity. Neuro:  Nonfocal  Psych: Normal affect   Labs High Sensitivity Troponin:   Recent Labs  Lab 11/20/24 1822 11/20/24 2028  TROPONINIHS 24* 26*     Chemistry Recent Labs  Lab 11/20/24 1413 11/20/24 1822  11/21/24 0155 11/22/24 0358  NA 139  --  139 141  K 4.0  --  4.0 4.9  CL 102  --  108 109  CO2 24  --  22 21*  GLUCOSE 102*  --  101* 113*  BUN 18  --  18 20  CREATININE 1.24*  --  1.27* 1.34*  CALCIUM  9.8  --  8.8* 9.0  MG  --  1.9  --   --   GFRNONAA 43*  --  42* 39*  ANIONGAP 14  --  9 11    Lipids No results for input(s): CHOL, TRIG, HDL, LABVLDL, LDLCALC, CHOLHDL in the last 168 hours.  Hematology Recent Labs  Lab 11/20/24 1413  WBC 10.1  RBC 5.08  HGB 14.1  HCT 43.2  MCV 85.0  MCH 27.8  MCHC 32.6  RDW 15.7*  PLT 263   Thyroid   Recent Labs  Lab 11/20/24 1822  TSH 2.431    BNPNo results for input(s): BNP, PROBNP in the last 168 hours.  DDimer No results for input(s): DDIMER in the last 168 hours.   Radiology    Cardiac Studies  11/21/24: TTE 1. Left ventricular ejection  fraction, by estimation, is 60 to 65%. The  left ventricle has normal function. The left ventricle has no regional  wall motion abnormalities. There is mild left ventricular hypertrophy.  Left ventricular diastolic parameters  are consistent with Grade II diastolic dysfunction (pseudonormalization).   2. Right ventricular systolic function is normal. The right ventricular  size is not well visualized.   3. Left atrial size was severely dilated.   4. Right atrial size was moderately dilated.   5. The mitral valve was not well visualized. Mild to moderate mitral  valve regurgitation. Severe mitral annular calcification.   6. The aortic valve was not well visualized. There is moderate  calcification of the aortic valve. Aortic valve regurgitation is not  visualized.   Comparison(s): No significant change from prior study.   ECHO 05/2023 > LVEF 50-55%, LA/RA moderately dilated  CT Cardiac Morphology 11/2023 > normal pulmonary vein drainage into the LA, the LAA is a large chicken wing type, CCS 408 / 73rd percentile for matched controls EPS 12/08/23 >  AF on presentation,  successful electrical isolation and anatomical encircling of all four pulmonary veins with PFA, posterior wall isolation using PFA, CTI ablation ECHO 12/13/23 ([facility]) > LVEF 67-70%, mild MV regurgitation, mildly thickened AV leaflets  CT Chest w/Contrast 12/11/23 > neg for PE / dissection, minimal coronary atherosclerosis, small pericardial effusion, bilateral infiltrates consistent with PNA Lexiscan NM Stress 12/23/23 > no ischemia, low normal LV systolic function w/EF 51%    Patient Profile   83 y.o. female w/PMHx of  HTN, HLD, CKD (III), COPD   Admitted with recent up-tick in AFib, in the last weeks or so, and in the days prior to admission, tachypalpitations with moments of lightheaded spells She was found in AFib w/RVR > IV lopressor  with mostly post conversion pauses and recurrent transient SB, junctional rates to 30's Admitted for further management And then again settled back into AFib fair rate controll with some faster rates intermittently Initially suspected need for PPM  AFib hx Diagnosed 2017 Sotalol  AFib and CTI ablation 12/08/23 Post ablation: she was admitted 12/19 - 12/23/22 to [facility]. She had not been taking her lasix  post ablation and she developed shortness of breath. She was profoundly hypoxic on presentation 69% on RA. She was admitted with bilateral infiltrates, small pleural effusions and was treated as PNA. Her sotalol  was stopped with QTc of 491 ms and she was started on Toprol  XL. She had a negative stress test while inpatient. She was discharged on O2   Assessment & Plan   Persistent AFib AFlutter Hx of PVI/CTI ablation Dec 2024 Home Eliquis  resumed yesterday with documented SR on 11/30-12/1 timeframe   3.   RBBB New   4.   Tachy-brady In my review the vast majority of brady/pauses were post conversion She has had some progression of conduction system disease  Dr. Almetta has been bedside this morning Patient feels poorly in AFib, she prefers  to pursue DCCV in patient rather then amio load for a couple weeks  Plan:  Rhythm control with amiodarone Rates not ideal > amio gtt to run at 60mg /hr >> DCCV ~ Thursday   5. COPD Home regime   For questions or updates, please contact [facility] Please consult [email or web address] for contact info under     Signed, Charlies Macario Arthur, PA-C  11/22/2024, 9:41 AM

## 2024-11-22 NOTE — Progress Notes (Signed)
 Mobility Specialist Progress Note:    11/22/24 1123  Therapy Vitals  Temp 98 F (36.7 C)  Temp Source Oral  Pulse Rate 95  Resp 15  BP 114/72  Patient Position (if appropriate) Lying  Oxygen Therapy  SpO2 94 %  Mobility  Activity Ambulated with assistance  Level of Assistance Standby assist, set-up cues, supervision of patient - no hands on  Assistive Device None  Distance Ambulated (ft) 300 ft  Activity Response Tolerated well  Mobility Referral Yes  Mobility visit 1 Mobility  Mobility Specialist Start Time (ACUTE ONLY) 1031  Mobility Specialist Stop Time (ACUTE ONLY) 1042  Mobility Specialist Time Calculation (min) (ACUTE ONLY) 11 min   Pt received in bed agreeable to mobility. No physical assistance required, supervision for safety. HR ranged from 110-140 throughout session yet pt had no c/o. Took x1 standing rest break to monitor HR. Returned to room w/o fault. Left in bed w/ call bell and personal belongings in reach. All needs met. HR returned to 100-110. RN aware.   Thersia Minder Mobility Specialist  Please contact vis Secure Chat or  Rehab Office [phone]

## 2024-11-22 NOTE — Plan of Care (Signed)
  Problem: Clinical Measurements: Goal: Will remain free from infection Outcome: Progressing   Problem: Activity: Goal: Risk for activity intolerance will decrease Outcome: Progressing   Problem: Nutrition: Goal: Adequate nutrition will be maintained Outcome: Progressing   Problem: Coping: Goal: Level of anxiety will decrease Outcome: Progressing   Problem: Elimination: Goal: Will not experience complications related to bowel motility Outcome: Progressing Goal: Will not experience complications related to urinary retention Outcome: Progressing   Problem: Pain Managment: Goal: General experience of comfort will improve and/or be controlled Outcome: Progressing

## 2024-11-22 NOTE — Plan of Care (Signed)

## 2024-11-22 NOTE — TOC CM/SW Note (Signed)
 Transition of Care [facility]) - Inpatient Brief Assessment   Patient Details  Name: Rebekah Harrell MRN: [id number] Date of Birth: 1941-10-27  [facility]) CM/SW Contact:    Lauraine FORBES Saa, LCSWA Phone Number: 11/22/2024, 9:23 AM   Clinical Narrative:  9:23 AM Per chart review, patient resides at home alone. Patient has a PCP and insurance. Patient does not have SNF history. Patient has HH history with [facility]. Patient has DME (rollator, oxygen) history. Patient's preferred pharmacy's are [facility], [facility] [address] [place] [address] [place] and [facility] Mail Delivery [facility]. No TOC needs identified at this time. TOC will continue to follow.  Transition of Care Asessment: Insurance and Status: Insurance coverage has been reviewed Patient has primary care physician: Yes Home environment has been reviewed: Private Residence Prior level of function:: N/A Prior/Current Home Services: No current home services Social Drivers of Health Review: SDOH reviewed no interventions necessary Readmission risk has been reviewed: Yes (Currently Green 13%) Transition of care needs: no transition of care needs at this time

## 2024-11-22 NOTE — Progress Notes (Signed)
 Patient c/o right ear pain/discomfort.  Warm compress applied.  Patient would like MD to look at it on rounds.

## 2024-11-23 ENCOUNTER — Other Ambulatory Visit: Payer: Self-pay

## 2024-11-23 ENCOUNTER — Other Ambulatory Visit (HOSPITAL_COMMUNITY): Payer: Self-pay

## 2024-11-23 DIAGNOSIS — I4819 Other persistent atrial fibrillation: Secondary | ICD-10-CM | POA: Diagnosis not present

## 2024-11-23 LAB — BASIC METABOLIC PANEL WITH GFR
Anion gap: 17 — ABNORMAL HIGH (ref 5–15)
BUN: 17 mg/dL (ref 8–23)
CO2: 23 mmol/L (ref 22–32)
Calcium: 9.1 mg/dL (ref 8.9–10.3)
Chloride: 102 mmol/L (ref 98–111)
Creatinine, Ser: 1.3 mg/dL — ABNORMAL HIGH (ref 0.44–1.00)
GFR, Estimated: 41 mL/min — ABNORMAL LOW (ref >60)
Glucose, Bld: 96 mg/dL (ref 70–99)
Potassium: 3.6 mmol/L (ref 3.5–5.1)
Sodium: 142 mmol/L (ref 135–145)

## 2024-11-23 MED ORDER — AMIODARONE HCL 200 MG PO TABS
400.0000 mg | ORAL_TABLET | Freq: Two times a day (BID) | ORAL | 0 refills | Status: DC
  Filled 2024-11-23 (×2): qty 56, 14d supply, fill #0
  Filled 2024-11-23: qty 28, 14d supply, fill #0

## 2024-11-23 MED ORDER — AMIODARONE HCL 200 MG PO TABS
400.0000 mg | ORAL_TABLET | Freq: Two times a day (BID) | ORAL | Status: DC
  Administered 2024-11-23: 400 mg via ORAL
  Filled 2024-11-23: qty 2

## 2024-11-23 MED ORDER — AMIODARONE HCL 200 MG PO TABS: 200.0000 mg | ORAL_TABLET | Freq: Every day | ORAL | 5 refills | Status: DC

## 2024-11-23 NOTE — Progress Notes (Signed)
 Mobility Specialist Progress Note:    11/23/24 1100  Mobility  Activity Ambulated with assistance  Level of Assistance Standby assist, set-up cues, supervision of patient - no hands on  Assistive Device Front wheel walker  Distance Ambulated (ft) 300 ft  Activity Response Tolerated well  Mobility Referral Yes  Mobility visit 1 Mobility  Mobility Specialist Start Time (ACUTE ONLY) [id number]  Mobility Specialist Stop Time (ACUTE ONLY) 0935  Mobility Specialist Time Calculation (min) (ACUTE ONLY) 10 min   Received pt in bed having no complaints and agreeable to mobility. Pt was asymptomatic throughout ambulation. HR stayed between 60-70. Returned to room w/o fault. Left in bed w/ call bell in reach and all needs met.   Thersia Minder Mobility Specialist  Please contact vis Secure Chat or  Rehab Office [phone]

## 2024-11-23 NOTE — Progress Notes (Signed)
 This chaplain responded to unit page for notarizing the Pt. Advance Directive, HCPOA and Living Will. The Pt. received AD education and answered the chaplain's clarifying questions before calling the notary.  The Pt. named Rebekah Harrell as her healthcare agent. If this person is unable or unwilling to serve in this role the Pt. next choice is [employer].  The chaplain is present with the Pt., family, notary, and witnesses for the notarizing of the Pt. Advance Directive. The chaplain gave the Pt. the original AD along with two copies. The chaplain scanned the Pt. AD into the Pt. EMR.  This chaplain is available for F/U spiritual care as needed.  Chaplain Leeroy Hummer [phone]

## 2024-11-23 NOTE — TOC Transition Note (Signed)
 Transition of Care [facility]) - Discharge Note   Patient Details  Name: Rebekah Harrell MRN: [id number] Date of Birth: 06/11/1941  Transition [facility] Care [facility]) CM/SW Contact:  Roxie KANDICE Stain, RN Phone Number: 11/23/2024, 11:47 AM   Clinical Narrative:    [name] is stable to discharge home. Follow up apt on AVS. No ICM (Inpatient Care Management) needs at this time.    Final next level of care: Home/Self Care Barriers to Discharge: Barriers Resolved   Patient Goals and CMS Choice Patient states their goals for this hospitalization and ongoing recovery are:: return home          Discharge Placement                 home      Discharge Plan and Services Additional resources added to the After Visit Summary for                                       Social Drivers of Health (SDOH) Interventions SDOH Screenings   Food Insecurity: No Food Insecurity (11/20/2024)  Housing: Low Risk  (11/20/2024)  Transportation Needs: No Transportation Needs (11/20/2024)  Utilities: Not At Risk (11/20/2024)  Alcohol Screen: Low Risk  (04/28/2023)  Depression (PHQ2-9): Low Risk  (06/20/2024)  Financial Resource Strain: Medium Risk (06/16/2024)  Physical Activity: Insufficiently Active (06/16/2024)  Social Connections: Moderately Isolated (11/20/2024)  Stress: Stress Concern Present (06/16/2024)  Tobacco Use: Low Risk  (11/20/2024)  Health Literacy: Adequate Health Literacy (01/25/2024)     Readmission Risk Interventions     No data to display

## 2024-11-23 NOTE — Care Management Important Message (Signed)
 Important Message  Patient Details  Name: Rebekah Harrell MRN: [id number] Date of Birth: 1941/02/21   Important Message Given:  Yes - Medicare IM     Claretta Deed 11/23/2024, 2:42 PM

## 2024-11-23 NOTE — Discharge Summary (Signed)
 DISCHARGE SUMMARY    Patient ID: Rebekah Harrell,  MRN: [id number], DOB/AGE: [birth year] 83 y.o.  Admit date: 11/20/2024 Discharge date: 11/23/2024  Primary Care Physician: Alvan Dorothyann BIRCH, MD Primary Cardiologist: Dr. Towanda Electrophysiologist: Dr. Inocencio  Primary Discharge Diagnosis:  Persistent AFib AFlutter RBBB  Secondary Discharge Diagnosis:  COPD HTN CKD (III)  Allergies  Allergen Reactions   Ace Inhibitors Swelling   Arnuity Ellipta  [Fluticasone  Furoate] Other (See Comments)    Vocal hoarseness   Lipitor [Atorvastatin] Other (See Comments)    Myalgias   Tape Itching, Dermatitis and Other (See Comments)    Localized redness at removal site  Reaction occurred with the removal of paper tape   Yellow Dyes (Non-Tartrazine) Other (See Comments)    Allergy  test   Zocor  [Simvastatin ] Other (See Comments)    Myalgias   Zyrtec [Cetirizine] Other (See Comments)    Fatigue    Celebrex [Celecoxib] Other (See Comments)    Hair loss   Penicillins Other (See Comments)    Unknown childhood reaction    Sulfonamide Derivatives Rash     Procedures This Admission:  none  Brief HPI: Rebekah Harrell is a 83 y.o. female reported recent up-tick in AFib, in the last weeks or so, and in the days prior to admission, tachypalpitations with moments of lightheaded spells.  The day of admission abrupt increase in persistent fast rates, with periods of lightheaded spells, found in rapid AFib  HISTORICALLY AFib hx Diagnosed 2017 Sotalol  >> stopped during hospitalization with PNA shortly after her ablation Dec 2024 AFib and CTI ablation 12/08/23  Hospital Course:  The patient was treated initially with IV lopressor   and developed mostly post conversion pauses and recurrent transient SB, junctional rates to 30's.  She did have some brief sinus-sinus pauses, bradycardia that were very transient.  Admitted for further management, labs largely  unremarkable. Ultimately settled back into AFib fair rate controll with some faster rates intermittently. EP was consulted for evaluation and management recommendations In d/w her attending EP< planned to pursue rhythm control with AAD and started on amiodarone. TTE with preserved LVEF , no WMA, mid-mod MR Amiodarone was loaded, her home Eliquis  resumed. She did convert to accel junctional rhythm 70's > SR 60's-80s and transitioned this morning to oral amiodarone. The patient feels well, denies any CP/SOB, no ongoing ear pain, she mentioned to staff last evening.  Advised if recurrent to f/u with her PMD She was examined by Dr. Almetta and considered stable for discharge to home.   Amiodarone 400mg  BID for 2 weeks > then daily Stop home diltiazem  AFib clinic follow up arranged, plan surveillance labs and confirm down titration of amiodarone at that visit  Advised she discuss which inhaler she prefers with her pulmonologist/PMD.  Physical Exam: Vitals:   11/22/24 2056 11/22/24 2244 11/23/24 0329 11/23/24 0727  BP:  (!) 100/58 (!) 107/55 131/71  Pulse:   60 63  Resp:  20 15 18   Temp:  98.3 F (36.8 C) 97.7 F (36.5 C) 98 F (36.7 C)  TempSrc:  Oral Oral Oral  SpO2: 98% 98% 98% 96%  Weight:      Height:        GEN- The patient is well appearing, alert and oriented x 3 today.   HEENT: normocephalic, atraumatic; sclera clear, conjunctiva pink; hearing intact; oropharynx clear; neck supple, no JVP Lungs-  CTA b/l, normal work of breathing.  No wheezes, rales, rhonchi Heart- RRR, no murmurs, rubs  or gallops, PMI not laterally displaced GI- soft, non-tender, non-distended Extremities- no clubbing, cyanosis, or edema MS- no significant deformity or atrophy Skin- warm and dry, no rash or lesion Psych- euthymic mood, full affect Neuro- no gross deficits   Labs:   Lab Results  Component Value Date   WBC 10.1 11/20/2024   HGB 14.1 11/20/2024   HCT 43.2 11/20/2024   MCV 85.0  11/20/2024   PLT 263 11/20/2024    Recent Labs  Lab 11/23/24 0449  NA 142  K 3.6  CL 102  CO2 23  BUN 17  CREATININE 1.30*  CALCIUM  9.1  GLUCOSE 96    Discharge Medications:  Allergies as of 11/23/2024       Reactions   Ace Inhibitors Swelling   Arnuity Ellipta  [fluticasone  Furoate] Other (See Comments)   Vocal hoarseness   Lipitor [atorvastatin] Other (See Comments)   Myalgias   Tape Itching, Dermatitis, Other (See Comments)   Localized redness at removal site Reaction occurred with the removal of paper tape   Yellow Dyes (non-tartrazine) Other (See Comments)   Allergy  test   Zocor  [simvastatin ] Other (See Comments)   Myalgias   Zyrtec [cetirizine] Other (See Comments)   Fatigue    Celebrex [celecoxib] Other (See Comments)   Hair loss   Penicillins Other (See Comments)   Unknown childhood reaction    Sulfonamide Derivatives Rash        Medication List     STOP taking these medications    diltiazem  120 MG 24 hr capsule Commonly known as: CARDIZEM  CD       TAKE these medications    amiodarone 400 MG tablet Commonly known as: PACERONE Take 1 tablet (400 mg total) by mouth 2 (two) times daily for 14 days.   amiodarone 200 MG tablet Commonly known as: Pacerone Take 1 tablet (200 mg total) by mouth daily. Start taking on: December 08, 2024   CALCIUM  PO Take 1 tablet by mouth daily.   dapagliflozin  propanediol 10 MG Tabs tablet Commonly known as: Farxiga  Take 1 tablet (10 mg total) by mouth daily before breakfast. What changed: when to take this   Eliquis  5 MG Tabs tablet Generic drug: apixaban  TAKE 1 TABLET TWICE DAILY   famotidine  20 MG tablet Commonly known as: PEPCID  TAKE 1 TABLET AT BEDTIME   furosemide  20 MG tablet Commonly known as: LASIX  Take 1 tablet (20 mg total) by mouth daily.   MAGNESIUM  PO Take 1 tablet by mouth daily.   montelukast  10 MG tablet Commonly known as: SINGULAIR  Take 1 tablet (10 mg total) by mouth at  bedtime.   Multivitamin Women 50+ Tabs Take 1 tablet by mouth daily.   omeprazole  20 MG capsule Commonly known as: PRILOSEC TAKE 1 CAPSULE EVERY DAY   potassium chloride  SA 20 MEQ tablet Commonly known as: KLOR-CON  M TAKE 1 TABLET EVERY DAY   rosuvastatin  20 MG tablet Commonly known as: CRESTOR  TAKE 1 TABLET EVERY DAY What changed: when to take this   Spiriva  Respimat 1.25 MCG/ACT Aers Generic drug: Tiotropium Bromide INHALE 2 PUFFS INTO THE LUNGS DAILY. Notes to patient: Discuss with your pulmonary specialist/primary doctors   Stiolto Respimat  2.5-2.5 MCG/ACT Aers Generic drug: Tiotropium Bromide-Olodaterol Inhale 2 puffs into the lungs daily. Notes to patient: Discuss with your pulmonary specialist/primary doctors   traMADol  50 MG tablet Commonly known as: ULTRAM  Take 1 tablet (50 mg total) by mouth every 12 (twelve) hours as needed for moderate pain (pain score 4-6). What changed:  when to take this   VITAMIN D-3 PO Take 1 capsule by mouth daily.   ZINC PO Take 1 tablet by mouth daily.        Disposition: home Discharge Instructions     Diet - low sodium heart healthy   Complete by: As directed    Increase activity slowly   Complete by: As directed         Duration of Discharge Encounter: 15 minutes, APP time  Bonney Charlies Arthur, PA-C 11/23/2024 9:59 AM

## 2024-11-24 ENCOUNTER — Ambulatory Visit (HOSPITAL_COMMUNITY): Admit: 2024-11-24 | Admitting: Cardiology

## 2024-11-24 ENCOUNTER — Telehealth: Payer: Self-pay

## 2024-11-24 ENCOUNTER — Telehealth: Payer: Self-pay | Admitting: Cardiology

## 2024-11-24 ENCOUNTER — Encounter: Payer: Self-pay | Admitting: Cardiology

## 2024-11-24 ENCOUNTER — Other Ambulatory Visit (HOSPITAL_COMMUNITY): Payer: Self-pay

## 2024-11-24 SURGERY — CARDIOVERSION (CATH LAB)
Anesthesia: General

## 2024-11-24 MED ORDER — DILTIAZEM HCL ER COATED BEADS 120 MG PO CP24
120.0000 mg | ORAL_CAPSULE | Freq: Every day | ORAL | 1 refills | Status: DC
  Filled 2024-11-24 – 2024-12-01 (×3): qty 90, 90d supply, fill #0

## 2024-11-24 NOTE — Telephone Encounter (Signed)
 Patient c/o Palpitations: STAT if patient c/o lightheadedness, shortness of breath, or chest pain  How long have you had palpitations/irregular HR/ Afib? Are you having the symptoms now?  Pt stated she was in and out of Afib yesterday but back in Afib now.   Are you currently experiencing lightheadedness, SOB or CP? No  Do you have a history of afib (atrial fibrillation) or irregular heart rhythm? Yes  Have you checked your BP or HR? (document readings if available): 171/78 79 around 8:40am  Are you experiencing any other symptoms? No  Pt c/o medication issue:  1. Name of Medication:   amiodarone (PACERONE) 200 MG tablet    2. How are you currently taking this medication (dosage and times per day)?  Take 2 tablets (400 mg total) by mouth 2 (two) times daily for 14 days.      3. Are you having a reaction (difficulty breathing--STAT)? No  4. What is your medication issue? Pt feels this medication isn't working. Please advise

## 2024-11-24 NOTE — Telephone Encounter (Signed)
 Pt advised to restart Diltiazem  120 mg per Dr. Inocencio. Pt would like delivered, sent Rx to [facility] pharmacy for delivery.

## 2024-11-24 NOTE — Patient Instructions (Signed)
 Visit Information  Thank you for taking time to visit with me today. Please don't hesitate to contact me if I can be of assistance to you before our next scheduled telephone appointment.  Our next appointment is by telephone on Tuesday December 9th at 1:00pm  Following is a copy of your care plan:   Goals Addressed             This Visit's Progress    VBCI Transitions of Care (TOC) Care Plan       Problems:  Recent Hospitalization for treatment of Atrial Fibrillation Hospital or Ed Admit Risk of 93%  Goal:  Over the next 30 days, the patient will not experience hospital readmission  Interventions:   AFIB Interventions:   Counseled on increased risk of stroke due to Afib and benefits of anticoagulation for stroke prevention Reviewed importance of adherence to anticoagulant exactly as prescribed Counseled on bleeding risk associated with Eliquis  and importance of self-monitoring for signs/symptoms of bleeding Counseled on avoidance of NSAIDs due to increased bleeding risk with anticoagulants Counseled on importance of regular laboratory monitoring as prescribed Screening for signs and symptoms of depression related to chronic disease state  Fall Precautions Avoid sharp objects like razors, broken glass. Cautions with use of knives and scissors  Patient Self Care Activities:  Attend all scheduled provider appointments Attend church or other social activities Call provider office for new concerns or questions  Notify RN Care Manager of TOC call rescheduling needs Participate in Transition of Care Program/Attend [facility] scheduled calls Perform all self care activities independently  Take medications as prescribed    Plan:  Telephone follow up appointment with care management team member scheduled for:  Tuesday December 9th at 1:00pm        Patient verbalizes understanding of instructions and care plan provided today and agrees to view in MyChart. Active MyChart status and  patient understanding of how to access instructions and care plan via MyChart confirmed with patient.     The patient has been provided with contact information for the care management team and has been advised to call with any health related questions or concerns.   Please call the care guide team at [phone] if you need to cancel or reschedule your appointment.   Please call the Suicide and Crisis Lifeline: 988 call the [place]  National Suicide Prevention Lifeline: 6285826042 or TTY: (431) 580-0951 TTY (860)340-5475) to talk to a trained counselor if you are experiencing a Mental Health or Behavioral Health Crisis or need someone to talk to.  Medford Balboa, BSN, RN [place]  [facility] - [employer] [facility] RN Care Manager [phone]

## 2024-11-24 NOTE — Telephone Encounter (Signed)
 Pt calling back in to follow up and gave the follow readings.   181/80 74 156/66 74 162/79 73   Please advice.

## 2024-11-24 NOTE — Telephone Encounter (Signed)
 Went back into a.m around 4 pm yesterday. BP this morning 0840 - 171/78, HR 79 Currently BP is 181/80, HR 74 Symptoms:  heart fluttering Denies: sob, dizziness, chest discomfort/pain, near syncope  Recent hospitalization, starting on Amiodarone, stopped Diltiazem  d/t bradycardia. Aware forwarding to MD for review/advisement. Dr. Inocencio restart Diltaizem 120 mg daily?  Metoprolo was d/c earlier this year, after short time taking it, d/t profound fatigue.

## 2024-11-24 NOTE — Transitions of Care (Post Inpatient/ED Visit) (Signed)
 11/24/2024  Name: Rebekah Harrell MRN: [id number] DOB: Sep 05, 1941  Today's TOC FU Call Status: Today's TOC FU Call Status:: Successful TOC FU Call Completed TOC FU Call Complete Date: 11/24/24  Patient's Name and Date of Birth confirmed. Name, DOB  Transition Care Management Follow-up Telephone Call Date of Discharge: 11/23/24 Discharge Facility: Rebekah Harrell [facility]) Type of Discharge: Inpatient Admission Primary Inpatient Discharge Diagnosis:: A-fib; Chemical Conversion How have you been since you were released from the hospital?: Worse Any questions or concerns?: Yes Patient Questions/Concerns:: The patient states she is having symptoms of A-fib and her systolic BP is going up Patient Questions/Concerns Addressed: Notified Provider of Patient Questions/Concerns, Other: (The patient has called her Cardiologist)  Items Reviewed: Did you receive and understand the discharge instructions provided?: Yes Medications obtained,verified, and reconciled?: Yes (Medications Reviewed) Any new allergies since your discharge?: No Dietary orders reviewed?: Yes Type of Diet Ordered:: Low sodium Heart Healthy Do you have support at home?: Yes People in Home [RPT]: alone, child(ren), adult Name of Support/Comfort Primary Source: Rebekah Harrell  Medications Reviewed Today: Medications Reviewed Today     Reviewed by Rebekah Reusing, RN (Case Manager) on 11/24/24 at 1213  Med List Status: <None>   Medication Order Taking? Sig Documenting Provider Last Dose Status Informant  amiodarone (PACERONE) 200 MG tablet [id number]  Take 2 tablets (400 mg total) by mouth 2 (two) times daily for 14 days. Rebekah Charlies Helling, Harrell  Active   amiodarone (PACERONE) 200 MG tablet [id number]  Take 1 tablet (200 mg total) by mouth daily. Rebekah Harrell Rebekah Harrell  Active   CALCIUM  PO [id number] No Take 1 tablet by mouth daily. Provider, Historical, Harrell 11/19/2024 Active Self, Pharmacy Records  Cholecalciferol (VITAMIN D-3  PO) [id number] No Take 1 capsule by mouth daily. Provider, Historical, Harrell 11/19/2024 Active Self, Pharmacy Records  dapagliflozin  propanediol (FARXIGA ) 10 MG TABS tablet [id number] No Take 1 tablet (10 mg total) by mouth daily before breakfast.  Patient taking differently: Take 10 mg by mouth daily.   Rebekah Harrell 11/20/2024 Morning Active Self, Pharmacy Records  ELIQUIS  5 MG TABS tablet [id number] No TAKE 1 TABLET TWICE DAILY Rebekah Redell RAMAN, Harrell 11/20/2024  9:30 AM Active Self, Pharmacy Records  famotidine  (PEPCID ) 20 MG tablet [id number] No TAKE 1 TABLET AT BEDTIME Rebekah Dorothyann BIRCH, Harrell 11/19/2024 Active Self, Pharmacy Records  furosemide  (LASIX ) 20 MG tablet [id number] No Take 1 tablet (20 mg total) by mouth daily. Rebekah Harrell 11/20/2024 Morning Active Self, Pharmacy Records  MAGNESIUM  PO [id number] No Take 1 tablet by mouth daily. Provider, Historical, Harrell 11/19/2024 Active Self, Pharmacy Records  montelukast  (SINGULAIR ) 10 MG tablet [id number] No Take 1 tablet (10 mg total) by mouth at bedtime. Rebekah Dorothyann BIRCH, Harrell 11/19/2024 Active Self, Pharmacy Records  Multiple Vitamins-Minerals (MULTIVITAMIN WOMEN 50+) TABS [id number] No Take 1 tablet by mouth daily. Provider, Historical, Harrell 11/19/2024 Active Self, Pharmacy Records  Multiple Vitamins-Minerals (ZINC PO) [id number] No Take 1 tablet by mouth daily. Provider, Historical, Harrell 11/19/2024 Active Self, Pharmacy Records  omeprazole  [facility]) 20 MG capsule [id number] No TAKE 1 CAPSULE EVERY DAY Rebekah Dorothyann BIRCH, Harrell 11/20/2024 Morning Active Self, Pharmacy Records  potassium chloride  SA (KLOR-CON  M) 20 MEQ tablet [id number] No TAKE 1 TABLET EVERY DAY Rebekah Dorothyann BIRCH, Harrell 11/20/2024 Morning Active Self, Pharmacy Records  rosuvastatin  (CRESTOR ) 20 MG tablet [id number] No TAKE 1 TABLET EVERY DAY  Patient taking differently: Take 20 mg by mouth at bedtime.   Rebekah Redell RAMAN,  Harrell 11/19/2024 Active Self, Pharmacy Records   SPIRIVA  RESPIMAT 1.25 MCG/ACT AERS [id number] No INHALE 2 PUFFS INTO THE LUNGS DAILY.  Patient not taking: Reported on 11/21/2024   Rebekah Harrell Not Taking Active Self, Pharmacy Records  Tiotropium Bromide-Olodaterol (STIOLTO RESPIMAT ) 2.5-2.5 MCG/ACT AERS [id number] No Inhale 2 puffs into the lungs daily. Rebekah Dorothyann BIRCH, Harrell 11/19/2024 Active Self, Pharmacy Records           Med Note (Harrell, Rebekah HERO   Mon Nov 21, 2024  2:58 PM) Patient concerned this new inhaler caused her current heart-rate issues.  traMADol  (ULTRAM ) 50 MG tablet [id number] No Take 1 tablet (50 mg total) by mouth every 12 (twelve) hours as needed for moderate pain (pain score 4-6).  Patient taking differently: Take 50 mg by mouth in the morning and at bedtime.   Rebekah Dorothyann BIRCH, Harrell 11/20/2024 Morning Active Self, Pharmacy Records            Home Care and Equipment/Supplies: Were Home Health Services Ordered?: No Any new equipment or medical supplies ordered?: No  Functional Questionnaire: Do you need assistance with bathing/showering or dressing?: No Do you need assistance with meal preparation?: No Do you need assistance with eating?: No Do you have difficulty maintaining continence: No Do you need assistance with getting out of bed/getting out of a chair/moving?: No Do you have difficulty managing or taking your medications?: No  Follow up appointments reviewed: PCP Follow-up appointment confirmed?: Yes Date of PCP follow-up appointment?: 12/20/24 (The patient did not want to move up the appointment. She is meeting for the A-fib clinic) Follow-up Provider: [facility] Follow-up appointment confirmed?: Yes Date of Specialist follow-up appointment?: 12/07/24 Follow-Up Specialty Provider:: Dr. Nellene Do you need transportation to your follow-up appointment?: No Do you understand care options if your condition(s) worsen?: Yes-patient verbalized understanding  SDOH  Interventions Today    Flowsheet Row Most Recent Value  SDOH Interventions   Food Insecurity Interventions Intervention Not Indicated  Housing Interventions Intervention Not Indicated  Transportation Interventions Intervention Not Indicated  Utilities Interventions Intervention Not Indicated    Goals Addressed             This Visit's Progress    VBCI Transitions of Care (TOC) Care Plan       Problems:  Recent Hospitalization for treatment of Atrial Fibrillation Hospital or Ed Admit Risk of 93%  Goal:  Over the next 30 days, the patient will not experience hospital readmission  Interventions:   AFIB Interventions:   Counseled on increased risk of stroke due to Afib and benefits of anticoagulation for stroke prevention Reviewed importance of adherence to anticoagulant exactly as prescribed Counseled on bleeding risk associated with Eliquis  and importance of self-monitoring for signs/symptoms of bleeding Counseled on avoidance of NSAIDs due to increased bleeding risk with anticoagulants Counseled on importance of regular laboratory monitoring as prescribed Screening for signs and symptoms of depression related to chronic disease state  Fall Precautions Avoid sharp objects like razors, broken glass. Cautions with use of knives and scissors  Patient Self Care Activities:  Attend all scheduled provider appointments Attend church or other social activities Call provider office for new concerns or questions  Notify RN Care Manager of TOC call rescheduling needs Participate in Transition of Care Program/Attend [facility] scheduled calls Perform all self care activities independently  Take medications as prescribed    Plan:  Telephone follow up appointment with care management team member scheduled for:  Tuesday December 9th at  1:00pm        Medford Balboa, BSN, RN [place]  VBCI - [facility] RN Care Manager [phone]

## 2024-11-25 ENCOUNTER — Other Ambulatory Visit (HOSPITAL_COMMUNITY): Payer: Self-pay

## 2024-11-25 ENCOUNTER — Telehealth: Payer: Self-pay | Admitting: *Deleted

## 2024-11-25 NOTE — Telephone Encounter (Signed)
 Patient was started on Spiriva  and then switched by pcp to Stiolto. She went into a-fib last week.  Pt would like to discuss if she should be taking any of the inhalers. Pt currently taking Spiriva . She will continue this plan unless you want to try something else.   Copied from CRM #[id number]. Topic: Clinical - Medical Advice >> Nov 24, 2024 10:12 AM Devaughn RAMAN wrote: Reason for CRM: Pt was on SPIRIVA  RESPIMAT 1.25 MCG/ACT AERS but her PCP put her on Tiotropium Bromide-Olodaterol (STIOLTO RESPIMAT ) 2.5-2.5 MCG/ACT AERS snf and pt went into Afib on Monday. Pt was hospitalized Sunday- Wednesday because her pulse rate was high. Pt would like to discuss if she should be taking any of the medications. Pt would like to know if she should take any of the medications, pt stated she discussed the inhalers with Dr.Hunsucker.

## 2024-11-28 ENCOUNTER — Ambulatory Visit (HOSPITAL_COMMUNITY)

## 2024-11-29 ENCOUNTER — Other Ambulatory Visit: Payer: Self-pay

## 2024-11-29 NOTE — Patient Instructions (Signed)
 Visit Information  Thank you for taking time to visit with me today. Please don't hesitate to contact me if I can be of assistance to you before our next scheduled telephone appointment.  Our next appointment is by telephone on Thursday December 18th at 1:00pm  Following is a copy of your care plan:   Goals Addressed             This Visit's Progress    VBCI Transitions of Care (TOC) Care Plan       Problems: (reviewed 11/29/24) Recent Hospitalization for treatment of Atrial Fibrillation Hospital or Ed Admit Risk of 93%  Goal: (reviewed 11/29/24) Over the next 30 days, the patient will not experience hospital readmission  Interventions: (reviewed 11/29/24)  AFIB Interventions:   Counseled on increased risk of stroke due to Afib and benefits of anticoagulation for stroke prevention Reviewed importance of adherence to anticoagulant exactly as prescribed Counseled on bleeding risk associated with Eliquis  and importance of self-monitoring for signs/symptoms of bleeding Counseled on avoidance of NSAIDs due to increased bleeding risk with anticoagulants Counseled on importance of regular laboratory monitoring as prescribed Screening for signs and symptoms of depression related to chronic disease state  Fall Precautions Avoid sharp objects like razors, broken glass. Cautions with use of knives and scissors 11/29/24 - Restart Diltiazem  120mg  daily Take blood pressure twice daily  Patient Self Care Activities: (reviewed 11/29/24) Attend all scheduled provider appointments Attend church or other social activities Call provider office for new concerns or questions  Notify RN Care Manager of [facility] call rescheduling needs Participate in Transition of Care Program/Attend [facility] scheduled calls Perform all self care activities independently  Take medications as prescribed    Plan:  Telephone follow up appointment with care management team member scheduled for:  Thursday December 18th at 1:00pm         Patient verbalizes understanding of instructions and care plan provided today and agrees to view in MyChart. Active MyChart status and patient understanding of how to access instructions and care plan via MyChart confirmed with patient.     The patient has been provided with contact information for the care management team and has been advised to call with any health related questions or concerns.   Please call the care guide team at [phone] if you need to cancel or reschedule your appointment.   Please call the Suicide and Crisis Lifeline: 988 call the [place]  National Suicide Prevention Lifeline: 763-587-8843 or TTY: 802-532-5427 TTY 754-785-6570) to talk to a trained counselor if you are experiencing a Mental Health or Behavioral Health Crisis or need someone to talk to.  Medford Balboa, BSN, RN [place]  VBCI - [employer] [facility] RN Care Manager [phone]

## 2024-11-29 NOTE — Transitions of Care (Post Inpatient/ED Visit) (Signed)
 Transition of Care week 2  Visit Note  11/29/2024  Name: ARNETTE DRIGGS MRN: [id number]          DOB: 1941/04/02  Situation: Patient enrolled in [facility] 30-day program. Visit completed with Ludivina Bucco by telephone.   Background:   Initial Transition Care Management Follow-up Telephone Call Discharge Date and Diagnosis: 11/23/24, A-fib; Chemical Conversion   Past Medical History:  Diagnosis Date   Allergy     Aortic atherosclerosis    Arthritis    lower back; knees (11/26/2016)   Asthma    Chronic atrial fibrillation (HCC)    CKD (chronic kidney disease), stage III (HCC)    patient unaware of this on 11/26/2016   Complication of anesthesia    had trouble waking me up in the late 1990's after gallbladder OR   COPD (chronic obstructive pulmonary disease) (HCC)    GERD (gastroesophageal reflux disease)    Heart murmur    dx'd when I was a little kid   History of hiatal hernia    Hyperlipidemia    Hypertension    Hypoglycemia     Assessment: Patient Reported Symptoms: Cognitive Cognitive Status: Alert and oriented to person, place, and time, Normal speech and language skills      Neurological Neurological Review of Symptoms: No symptoms reported    HEENT HEENT Symptoms Reported: No symptoms reported      Cardiovascular Cardiovascular Symptoms Reported: Irregular pulse, Palpitations Does patient have uncontrolled Hypertension?: Yes Is patient checking Blood Pressure at home?: Yes Patient's Recent BP reading at home: 162/64, 171/68 Cardiovascular Management Strategies: Medical device, Medication therapy, Routine screening, Activity, Coping strategies Cardiovascular Comment: The patient's systolic numbers remain elevated. She contacted her Cardiologist and was told to restart her Diltiazem  to 120mg  daily. Her BP's are still elevated and she states she is back in A-fib. She does have an appointment with the A-fib clinic on 12/07/24.  Respiratory Respiratory Symptoms  Reported: Shortness of breath Respiratory Management Strategies: Medication therapy, Routine screening, Adequate rest  Endocrine Endocrine Symptoms Reported: No symptoms reported Is patient diabetic?: No    Gastrointestinal Gastrointestinal Symptoms Reported: Change in appetite Other Gastrointestinal Symptoms: Loss of appetite Gastrointestinal Management Strategies: Coping strategies    Genitourinary Genitourinary Symptoms Reported: No symptoms reported    Integumentary Integumentary Symptoms Reported: No symptoms reported    Musculoskeletal Musculoskelatal Symptoms Reviewed: No symptoms reported        Psychosocial Psychosocial Symptoms Reported: No symptoms reported         There were no vitals filed for this visit.    Medications Reviewed Today     Reviewed by Moises Reusing, RN (Case Manager) on 11/29/24 at 1326  Med List Status: <None>   Medication Order Taking? Sig Documenting Provider Last Dose Status Informant  amiodarone  (PACERONE ) 200 MG tablet [id number]  Take 2 tablets (400 mg total) by mouth 2 (two) times daily for 14 days. Leverne Charlies Helling, PA-C  Active   amiodarone  (PACERONE ) 200 MG tablet [id number]  Take 1 tablet (200 mg total) by mouth daily. Leverne Charlies Helling DEVONNA  Active   CALCIUM  PO [id number]  Take 1 tablet by mouth daily. [provider]  Active Self, Pharmacy Records  Cholecalciferol (VITAMIN D-3 PO) [id number]  Take 1 capsule by mouth daily. [provider]  Active Self, Pharmacy Records  dapagliflozin  propanediol (FARXIGA ) 10 MG TABS tablet [id number]  Take 1 tablet (10 mg total) by mouth daily before breakfast.  Patient taking differently: Take 10 mg by mouth daily.  Goodrich, Callie E, PA-C  Active Self, Pharmacy Records  diltiazem  (CARDIZEM  CD) 120 MG 24 hr capsule [id number]  Take 1 capsule (120 mg total) by mouth daily. Inocencio Soyla Lunger, MD  Active   ELIQUIS  5 MG TABS tablet [id number]  TAKE 1 TABLET TWICE DAILY Pietro  Redell RAMAN, MD  Active Self, Pharmacy Records  famotidine  (PEPCID ) 20 MG tablet [id number]  TAKE 1 TABLET AT BEDTIME Alvan Dorothyann BIRCH, MD  Active Self, Pharmacy Records  furosemide  (LASIX ) 20 MG tablet [id number]  Take 1 tablet (20 mg total) by mouth daily. Goodrich, Callie E, PA-C  Active Self, Pharmacy Records  MAGNESIUM  PO [id number]  Take 1 tablet by mouth daily. [provider]  Active Self, Pharmacy Records  montelukast  (SINGULAIR ) 10 MG tablet [id number]  Take 1 tablet (10 mg total) by mouth at bedtime. Alvan Dorothyann BIRCH, MD  Active Self, Pharmacy Records  Multiple Vitamins-Minerals (MULTIVITAMIN WOMEN 50+) TABS [id number]  Take 1 tablet by mouth daily. [provider]  Active Self, Pharmacy Records  Multiple Vitamins-Minerals (ZINC PO) [id number]  Take 1 tablet by mouth daily. [provider]  Active Self, Pharmacy Records  omeprazole  [facility] [place]) 20 MG capsule [id number]  TAKE 1 CAPSULE EVERY DAY Alvan Dorothyann BIRCH, MD  Active Self, Pharmacy Records  potassium chloride  SA (KLOR-CON  M) 20 MEQ tablet [id number]  TAKE 1 TABLET EVERY DAY Alvan Dorothyann BIRCH, MD  Active Self, Pharmacy Records  rosuvastatin  (CRESTOR ) 20 MG tablet [id number]  TAKE 1 TABLET EVERY DAY  Patient taking differently: Take 20 mg by mouth at bedtime.   Pietro Redell RAMAN, MD  Active Self, Pharmacy Records  SPIRIVA  RESPIMAT 1.25 MCG/ACT AERS [id number]  INHALE 2 PUFFS INTO THE LUNGS DAILY.  Patient not taking: Reported on 11/21/2024   Mannam, Praveen, MD  Active Self, Pharmacy Records  Tiotropium Bromide-Olodaterol (STIOLTO RESPIMAT ) 2.5-2.5 MCG/ACT AERS [id number]  Inhale 2 puffs into the lungs daily. Alvan Dorothyann BIRCH, MD  Active Self, Pharmacy Records           Med Note [facility], JON HERO   Mon Nov 21, 2024  2:58 PM) Patient concerned this new inhaler caused her current heart-rate issues.  traMADol  (ULTRAM ) 50 MG tablet [id number]  Take 1 tablet (50 mg total) by mouth every 12 (twelve) hours  as needed for moderate pain (pain score 4-6).  Patient taking differently: Take 50 mg by mouth in the morning and at bedtime.   Alvan Dorothyann BIRCH, MD  Active Self, Pharmacy Records            Recommendation:   Continue Current Plan of Care  Follow Up Plan:   Telephone follow-up in 1 week  Medford Balboa, BSN, RN [place]  VBCI - [facility] RN Care Manager [phone]

## 2024-12-01 ENCOUNTER — Other Ambulatory Visit: Payer: Self-pay | Admitting: Student

## 2024-12-01 ENCOUNTER — Other Ambulatory Visit: Payer: Self-pay

## 2024-12-01 ENCOUNTER — Telehealth: Payer: Self-pay

## 2024-12-01 ENCOUNTER — Other Ambulatory Visit (HOSPITAL_COMMUNITY): Payer: Self-pay

## 2024-12-01 ENCOUNTER — Other Ambulatory Visit (HOSPITAL_BASED_OUTPATIENT_CLINIC_OR_DEPARTMENT_OTHER): Payer: Self-pay

## 2024-12-01 ENCOUNTER — Telehealth: Payer: Self-pay | Admitting: Cardiology

## 2024-12-01 ENCOUNTER — Other Ambulatory Visit: Payer: Self-pay | Admitting: Physician Assistant

## 2024-12-01 MED ORDER — SPIRIVA RESPIMAT 1.25 MCG/ACT IN AERS: 2.0000 | INHALATION_SPRAY | Freq: Every day | RESPIRATORY_TRACT | 0 refills | Status: DC

## 2024-12-01 NOTE — Telephone Encounter (Signed)
 Patient aware,refill sent.NFN

## 2024-12-01 NOTE — Telephone Encounter (Signed)
 Pt c/o medication issue:  1. Name of Medication: diltiazem  (CARDIZEM  CD) 120 MG 24 hr capsule   2. How are you currently taking this medication (dosage and times per day)? As written  3. Are you having a reaction (difficulty breathing--STAT)? No   4. What is your medication issue? Pharmacy called stating pt is allergic to yellow dye. They currently have the pill in blue will this be ok for pt to take please advise

## 2024-12-01 NOTE — Telephone Encounter (Signed)
 Change to spiriva  is ok. Does she need refills?

## 2024-12-01 NOTE — Telephone Encounter (Signed)
 Copied from CRM #[id number]. Topic: Clinical - Prescription Issue >> Dec 01, 2024 10:59 AM Devaughn RAMAN wrote: Reason for CRM: Pt calling in regards to medication switch from Tiotropium Bromide-Olodaterol (STIOLTO RESPIMAT ) 2.5-2.5 MCG/ACT AERS to SPIRIVA  RESPIMAT 1.25 MCG/ACT AERS. Pt stated she went into Afib and she would like to go back on Spiriva  and speak with Dr.Hunsucker regarding this as she has been awaiting a response since 12.4.25.  Dr Annella, please advise if this is okay

## 2024-12-01 NOTE — Telephone Encounter (Signed)
 I called the pharmacy back and let them know that the blue capsule is okay to give to the patient as long as the medication is the same.

## 2024-12-06 ENCOUNTER — Other Ambulatory Visit: Payer: Self-pay | Admitting: Pulmonary Disease

## 2024-12-07 ENCOUNTER — Encounter (HOSPITAL_COMMUNITY): Payer: Self-pay

## 2024-12-07 ENCOUNTER — Emergency Department (HOSPITAL_COMMUNITY)

## 2024-12-07 ENCOUNTER — Ambulatory Visit (HOSPITAL_COMMUNITY): Admitting: Physician Assistant

## 2024-12-07 ENCOUNTER — Emergency Department (HOSPITAL_COMMUNITY)
Admission: EM | Admit: 2024-12-07 | Discharge: 2024-12-07 | Disposition: A | Discharge status: Home / Self Care | Attending: Emergency Medicine | Admitting: Emergency Medicine

## 2024-12-07 ENCOUNTER — Other Ambulatory Visit: Payer: Self-pay | Admitting: Cardiology

## 2024-12-07 ENCOUNTER — Telehealth: Payer: Self-pay | Admitting: Cardiology

## 2024-12-07 ENCOUNTER — Other Ambulatory Visit: Payer: Self-pay

## 2024-12-07 DIAGNOSIS — I5032 Chronic diastolic (congestive) heart failure: Secondary | ICD-10-CM | POA: Insufficient documentation

## 2024-12-07 DIAGNOSIS — I4819 Other persistent atrial fibrillation: Secondary | ICD-10-CM | POA: Insufficient documentation

## 2024-12-07 DIAGNOSIS — R002 Palpitations: Secondary | ICD-10-CM | POA: Diagnosis present

## 2024-12-07 DIAGNOSIS — I495 Sick sinus syndrome: Secondary | ICD-10-CM | POA: Insufficient documentation

## 2024-12-07 DIAGNOSIS — N183 Chronic kidney disease, stage 3 unspecified: Secondary | ICD-10-CM | POA: Insufficient documentation

## 2024-12-07 DIAGNOSIS — I130 Hypertensive heart and chronic kidney disease with heart failure and stage 1 through stage 4 chronic kidney disease, or unspecified chronic kidney disease: Secondary | ICD-10-CM | POA: Diagnosis not present

## 2024-12-07 DIAGNOSIS — I10 Essential (primary) hypertension: Secondary | ICD-10-CM | POA: Diagnosis not present

## 2024-12-07 DIAGNOSIS — Z79899 Other long term (current) drug therapy: Secondary | ICD-10-CM | POA: Diagnosis not present

## 2024-12-07 DIAGNOSIS — R1110 Vomiting, unspecified: Secondary | ICD-10-CM | POA: Diagnosis present

## 2024-12-07 DIAGNOSIS — R0602 Shortness of breath: Secondary | ICD-10-CM | POA: Diagnosis present

## 2024-12-07 DIAGNOSIS — I129 Hypertensive chronic kidney disease with stage 1 through stage 4 chronic kidney disease, or unspecified chronic kidney disease: Secondary | ICD-10-CM | POA: Insufficient documentation

## 2024-12-07 DIAGNOSIS — I4891 Unspecified atrial fibrillation: Secondary | ICD-10-CM | POA: Insufficient documentation

## 2024-12-07 DIAGNOSIS — E785 Hyperlipidemia, unspecified: Secondary | ICD-10-CM | POA: Diagnosis not present

## 2024-12-07 DIAGNOSIS — J449 Chronic obstructive pulmonary disease, unspecified: Secondary | ICD-10-CM | POA: Insufficient documentation

## 2024-12-07 DIAGNOSIS — D6869 Other thrombophilia: Secondary | ICD-10-CM

## 2024-12-07 DIAGNOSIS — I340 Nonrheumatic mitral (valve) insufficiency: Secondary | ICD-10-CM

## 2024-12-07 DIAGNOSIS — Z7901 Long term (current) use of anticoagulants: Secondary | ICD-10-CM | POA: Insufficient documentation

## 2024-12-07 DIAGNOSIS — I159 Secondary hypertension, unspecified: Secondary | ICD-10-CM

## 2024-12-07 DIAGNOSIS — Z7984 Long term (current) use of oral hypoglycemic drugs: Secondary | ICD-10-CM | POA: Insufficient documentation

## 2024-12-07 LAB — CBC WITH DIFFERENTIAL/PLATELET
Abs Immature Granulocytes: 0.04 K/uL (ref 0.00–0.07)
Basophils Absolute: 0.1 K/uL (ref 0.0–0.1)
Basophils Relative: 1 %
Eosinophils Absolute: 0 K/uL (ref 0.0–0.5)
Eosinophils Relative: 0 %
HCT: 39.7 % (ref 36.0–46.0)
Hemoglobin: 13 g/dL (ref 12.0–15.0)
Immature Granulocytes: 0 %
Lymphocytes Relative: 9 %
Lymphs Abs: 0.9 K/uL (ref 0.7–4.0)
MCH: 28.6 pg (ref 26.0–34.0)
MCHC: 32.7 g/dL (ref 30.0–36.0)
MCV: 87.3 fL (ref 80.0–100.0)
Monocytes Absolute: 0.5 K/uL (ref 0.1–1.0)
Monocytes Relative: 5 %
Neutro Abs: 7.9 K/uL — ABNORMAL HIGH (ref 1.7–7.7)
Neutrophils Relative %: 85 %
Platelets: 244 K/uL (ref 150–400)
RBC: 4.55 MIL/uL (ref 3.87–5.11)
RDW: 16.3 % — ABNORMAL HIGH (ref 11.5–15.5)
WBC: 9.4 K/uL (ref 4.0–10.5)
nRBC: 0 % (ref 0.0–0.2)

## 2024-12-07 LAB — BASIC METABOLIC PANEL WITH GFR
Anion gap: 14 (ref 5–15)
BUN: 13 mg/dL (ref 8–23)
CO2: 20 mmol/L — ABNORMAL LOW (ref 22–32)
Calcium: 9.3 mg/dL (ref 8.9–10.3)
Chloride: 104 mmol/L (ref 98–111)
Creatinine, Ser: 1.2 mg/dL — ABNORMAL HIGH (ref 0.44–1.00)
GFR, Estimated: 45 mL/min — ABNORMAL LOW (ref >60)
Glucose, Bld: 100 mg/dL — ABNORMAL HIGH (ref 70–99)
Potassium: 3.8 mmol/L (ref 3.5–5.1)
Sodium: 138 mmol/L (ref 135–145)

## 2024-12-07 LAB — TROPONIN T, HIGH SENSITIVITY: Troponin T High Sensitivity: 16 ng/L (ref 0–19)

## 2024-12-07 LAB — PRO BRAIN NATRIURETIC PEPTIDE: Pro Brain Natriuretic Peptide: 1376 pg/mL — ABNORMAL HIGH (ref <300.0)

## 2024-12-07 LAB — MAGNESIUM: Magnesium: 2.3 mg/dL (ref 1.7–2.4)

## 2024-12-07 MED ORDER — PINDOLOL 5 MG PO TABS: 5.0000 mg | ORAL_TABLET | Freq: Two times a day (BID) | ORAL | 1 refills | Status: DC

## 2024-12-07 MED ORDER — PINDOLOL 5 MG PO TABS
5.0000 mg | ORAL_TABLET | Freq: Two times a day (BID) | ORAL | Status: DC
  Filled 2024-12-07: qty 1

## 2024-12-07 NOTE — Telephone Encounter (Signed)
 Spoke with pt, she reports she went back into a fib as soon as she left the hospital and reports that she is currently in a fib. Her blood pressure is running 166/70/55 last night. This morning 211/92/67 and now it is 188/107/65. She reports her body is shaking, she is clammy and she tried to eat breakfast and vomited. She has an appointment at 11:30 am with the a fib clinic today but feels she needs to go to the ER. Appointment today has been cancelled.

## 2024-12-07 NOTE — Progress Notes (Incomplete)
 Primary Care Physician: Alvan Dorothyann BIRCH, MD Primary Cardiologist: Redell Shallow, MD Electrophysiologist: Will Gladis Norton, MD  Referring Physician: Aline Door PA   Rebekah Harrell is a 83 y.o. female with a history of chronic diastolic CHF, mild to moderate MR, hypertension, hyperlipidemia, CKD stage III, COPD, atrial fibrillation who presents for follow up in the [facility]. Previously seen by Arland Don NP. Patient was initial diagnosed with atrial fibrillation in 05/2016. She was admitted in 11/2016 with atrial fibrillation with RVR and acute diastolic CHF. Seen in follow up 04/2023 and was back in afib, s/p DCCV on 06/10/23. Patient was back in afib by follow up 06/23/23. Previously maintained on sotalol . She was seen by Dr Norton and underwent afib and flutter ablation on 12/08/23. She was hospitalized from 12/10/2023 to 12/24/2023 at [facility] with hypoxic respiratory failure, bilateral infiltrates and pleural effusions. Her sotalol  at that time was stopped and she was requiring supplemental oxygen at discharge.   She presented to the ED 11/20/24 with presyncope and was found to be in afib with RVR. She was given metoprolol  to tartrate IV and had conversion back to NSR with sinus bradycardia in the 50s however then developed progressive sinus bradycardia into the 30s and there is reportedly a 3 second pause for which she was symptomatic. She was seen by EP for tachybradycardia syndrome. PPM deferred at that time and she was started on amiodarone . Patient is on Eliquis  for stroke prevention.   Patient returns for follow up for atrial fibrillation and amiodarone  monitoring. ***   Today, she  denies symptoms of ***palpitations, chest pain, shortness of breath, orthopnea, PND, lower extremity edema, dizziness, presyncope, syncope, snoring, daytime somnolence, bleeding, or neurologic sequela. The patient is tolerating medications without difficulties and is  otherwise without complaint today.    Atrial Fibrillation Risk Factors:  she does not have symptoms or diagnosis of sleep apnea. she does not have a history of rheumatic fever. she does not have a history of alcohol use.   Atrial Fibrillation Management history:  Previous antiarrhythmic drugs: sotalol , amiodarone   Previous cardioversions: 06/10/23 Previous ablations: none Anticoagulation history: Eliquis   ROS- All systems are reviewed and negative except as per the HPI above.   Physical Exam: There were no vitals taken for this visit.  GEN: Well nourished, well developed in no acute distress NECK: No JVD; No carotid bruits*** CARDIAC: {EPRHYTHM:28826}, no murmurs, rubs, gallops RESPIRATORY:  Clear to auscultation without rales, wheezing or rhonchi  ABDOMEN: Soft, non-tender, non-distended EXTREMITIES:  No edema; No deformity    Wt Readings from Last 3 Encounters:  11/24/24 71.7 kg  11/20/24 71.8 kg  11/09/24 72.1 kg       ***  Echo 06/09/23 demonstrated   1. Left ventricular ejection fraction, by estimation, is 50 to 55%. The  left ventricle has low normal function. The left ventricle has no regional  wall motion abnormalities. There is mild left ventricular hypertrophy.  Left ventricular diastolic parameters are indeterminate.   2. Right ventricular systolic function is mildly reduced. The right  ventricular size is mildly enlarged. There is moderately elevated  pulmonary artery systolic pressure. The estimated right ventricular  systolic pressure is 46.9 mmHg.   3. Left atrial size was moderately dilated.   4. Right atrial size was moderately dilated.   5. The mitral valve is degenerative. Moderate mitral valve regurgitation.  Severe mitral annular calcification.   6. The aortic valve is tricuspid. Aortic valve regurgitation is not  visualized. Aortic valve sclerosis/calcification is present, without any  evidence of aortic stenosis.   7. The inferior vena cava  is dilated in size with >50% respiratory  variability, suggesting right atrial pressure of 8 mmHg.    CHA2DS2-VASc Score = 6  The patient's score is based upon: CHF History: 1 HTN History: 1 Diabetes History: 0 Stroke History: 0 Vascular Disease History: 1 (calcifications noted on CT) Age Score: 2 Gender Score: 1   {Confirm score is correct.  If not, click here to update score.  REFRESH note.  :1}    ASSESSMENT AND PLAN: Persistent Atrial Fibrillation/atrial flutter (ICD10:  I48.19) The patient's CHA2DS2-VASc score is 6, indicating a 9.7% annual risk of stroke.   S/p afib and flutter ablation 12/08/23. ***  Secondary Hypercoagulable State (ICD10:  D68.69){Click to add to Prob List or Visit Dx  :[id number]} The patient is at significant risk for stroke/thromboembolism based upon her CHA2DS2-VASc Score of 6.  {Anticoag or No Anticoag  :[id number]}    Signed,  Quita JONELLE Kicks, PA    12/07/2024 10:34 AM          ASSESSMENT AND PLAN: Persistent Atrial Fibrillation (ICD10:  I48.19) The patient's CHA2DS2-VASc score is 6, indicating a 9.7% annual risk of stroke.   S/p DCCV 06/10/23 with quick return of afib We discussed rhythm control options today including retrying DCCV now that she is off Symbicort , changing AAD, and ablation. Long term, she is agreeable to consultation with EP to discuss ablation. In the meantime, will arrange for repeat DCCV. Other AAD options include dofetilide and amiodarone  although with her COPD it may be best to avoid amiodarone .  Continue Eliquis  5 mg BID Continue sotalol  80 mg BID for now.   Secondary Hypercoagulable State (ICD10:  D68.69) The patient is at significant risk for stroke/thromboembolism based upon her CHA2DS2-VASc Score of 6.  Continue Apixaban  (Eliquis ).   Chronic diastolic CHF Fluid status appears stable today  Her BNP was 331.7 on 06/05/23 NYHA class II symptoms We discussed Alleviate HF, she is not ready to commit to the study but  would like more information, will send to research team.    Valvular heart disease Moderate MR Followed by routine echos  HTN Stable on current regimen ***   Follow up ***   Daril Kicks PA-C Afib [facility] [address] [place], [place] [address] [phone]

## 2024-12-07 NOTE — ED Provider Triage Note (Signed)
 Emergency Medicine Provider Triage Evaluation Note  AMANEE IACOVELLI , a 83 y.o. female  was evaluated in triage.  Pt complains of A-fib.  Reports that she was referred here from cardiology.  States that she has been shaking.  Did throw up once.  Denies any chest pain but does endorse some shortness of breath.  Review of Systems  Positive:  Negative:   Physical Exam  BP (!) 163/63 (BP Location: Right Arm)   Pulse 60   Temp 97.6 F (36.4 C)   Resp 18   SpO2 91%  Gen:   Awake, no distress   Resp:  Normal effort  MSK:   Moves extremities without difficulty  Other:    Medical Decision Making  Medically screening exam initiated at 12:26 PM.  Appropriate orders placed.  VERLENE GLANTZ was informed that the remainder of the evaluation will be completed by another provider, this initial triage assessment does not replace that evaluation, and the importance of remaining in the ED until their evaluation is complete.     Donnajean Lynwood DEL, PA-C 12/07/24 1226

## 2024-12-07 NOTE — ED Provider Notes (Signed)
 [place] EMERGENCY DEPARTMENT AT [facility] Provider Note   CSN: [id number] Arrival date & time: 12/07/24  1122     Patient presents with: Atrial Fibrillation, Emesis, Nausea, and Palpitations   Rebekah Harrell is a 83 y.o. female.  With a history of atrial fibrillation/a flutter on Eliquis , hypertension, hyperlipidemia COPD CKD who presents to the ED for hypertension.  Patient recently diagnosed with A-fib started on amiodarone  and diltiazem  and has been taking Eliquis .  Reports worsening A-fib symptoms at home nausea and vomiting shakiness this morning.  Repeatedly hypertensive with at home measurements.  Heart rate has been in the 50s to 60s at home.  Called A-fib clinic today and was supposed to be seen in the A-fib clinic but they directed her here for further evaluation.  At this time she denies chest pain shortness of breath    Atrial Fibrillation  Emesis Palpitations Associated symptoms: vomiting        Prior to Admission medications  Medication Sig Start Date End Date Taking? Authorizing Provider  amiodarone  (PACERONE ) 200 MG tablet Take 1 tablet (200 mg total) by mouth daily. 12/08/24   Leverne Charlies Helling, PA-C  amiodarone  (PACERONE ) 200 MG tablet Take 2 tablets (400 mg total) by mouth 2 (two) times daily for 14 days. 11/23/24 12/07/24  Leverne Charlies Helling, PA-C  CALCIUM  PO Take 1 tablet by mouth daily.    [provider]  Cholecalciferol (VITAMIN D-3 PO) Take 1 capsule by mouth daily.    [provider]  dapagliflozin  propanediol (FARXIGA ) 10 MG TABS tablet TAKE 1 TABLET EVERY DAY BEFORE BREAKFAST 12/01/24   Goodrich, Callie E, PA-C  diltiazem  (CARDIZEM  CD) 120 MG 24 hr capsule Take 1 capsule (120 mg total) by mouth daily. 11/24/24 03/01/25  Camnitz, Soyla Lunger, MD  ELIQUIS  5 MG TABS tablet TAKE 1 TABLET TWICE DAILY 06/08/24   Pietro Redell RAMAN, MD  famotidine  (PEPCID ) 20 MG tablet TAKE 1 TABLET AT BEDTIME 10/24/24   Alvan Dorothyann BIRCH, MD   furosemide  (LASIX ) 20 MG tablet Take 1 tablet (20 mg total) by mouth daily. 06/09/24   Goodrich, Callie E, PA-C  MAGNESIUM  PO Take 1 tablet by mouth daily.    [provider]  montelukast  (SINGULAIR ) 10 MG tablet Take 1 tablet (10 mg total) by mouth at bedtime. 07/13/24   Alvan Dorothyann BIRCH, MD  Multiple Vitamins-Minerals (MULTIVITAMIN WOMEN 50+) TABS Take 1 tablet by mouth daily.    [provider]  Multiple Vitamins-Minerals (ZINC PO) Take 1 tablet by mouth daily.    [provider]  omeprazole  (PRILOSEC) 20 MG capsule TAKE 1 CAPSULE EVERY DAY 03/16/24   Alvan Dorothyann BIRCH, MD  pindolol  (VISKEN ) 5 MG tablet Take 1 tablet (5 mg total) by mouth 2 (two) times daily. 12/07/24   Johnson, Kathleen R, PA-C  potassium chloride  SA (KLOR-CON  M) 20 MEQ tablet TAKE 1 TABLET EVERY DAY 08/11/24   Alvan Dorothyann BIRCH, MD  rosuvastatin  (CRESTOR ) 20 MG tablet TAKE 1 TABLET EVERY DAY Patient taking differently: Take 20 mg by mouth at bedtime. 11/03/24   Pietro Redell RAMAN, MD  Tiotropium Bromide (SPIRIVA  RESPIMAT) 1.25 MCG/ACT AERS INHALE 2 PUFFS EVERY DAY 12/07/24   Hunsucker, Donnice SAUNDERS, MD  Tiotropium Bromide-Olodaterol (STIOLTO RESPIMAT ) 2.5-2.5 MCG/ACT AERS Inhale 2 puffs into the lungs daily. Patient not taking: Reported on 11/29/2024 06/20/24   Alvan Dorothyann BIRCH, MD  traMADol  (ULTRAM ) 50 MG tablet Take 1 tablet (50 mg total) by mouth every 12 (twelve) hours as  needed for moderate pain (pain score 4-6). Patient taking differently: Take 50 mg by mouth in the morning and at bedtime. 08/15/24   Alvan Dorothyann BIRCH, MD    Allergies: Ace inhibitors, Arnuity ellipta  [fluticasone  furoate], Lipitor [atorvastatin], Tape, Yellow dyes (non-tartrazine), Zocor  [simvastatin ], Zyrtec [cetirizine], Celebrex [celecoxib], Penicillins, and Sulfonamide derivatives    Review of Systems  Cardiovascular:  Positive for palpitations.  Gastrointestinal:  Positive for vomiting.    Updated Vital  Signs BP (!) 181/76   Pulse 63   Temp 98 F (36.7 C) (Oral)   Resp 14   Ht 5' 3 (1.6 m)   Wt 71.2 kg   SpO2 96%   BMI 27.81 kg/m   Physical Exam Vitals and nursing note reviewed.  HENT:     Head: Normocephalic and atraumatic.  Eyes:     Pupils: Pupils are equal, round, and reactive to light.  Cardiovascular:     Rate and Rhythm: Normal rate and regular rhythm.  Pulmonary:     Effort: Pulmonary effort is normal.     Breath sounds: Normal breath sounds.  Abdominal:     Palpations: Abdomen is soft.     Tenderness: There is no abdominal tenderness.  Skin:    General: Skin is warm and dry.  Neurological:     Mental Status: She is alert.  Psychiatric:        Mood and Affect: Mood normal.     (all labs ordered are listed, but only abnormal results are displayed) Labs Reviewed  BASIC METABOLIC PANEL WITH GFR - Abnormal; Notable for the following components:      Result Value   CO2 20 (*)    Glucose, Bld 100 (*)    Creatinine, Ser 1.20 (*)    GFR, Estimated 45 (*)    All other components within normal limits  CBC WITH DIFFERENTIAL/PLATELET - Abnormal; Notable for the following components:   RDW 16.3 (*)    Neutro Abs 7.9 (*)    All other components within normal limits  PRO BRAIN NATRIURETIC PEPTIDE - Abnormal; Notable for the following components:   Pro Brain Natriuretic Peptide 1,376.0 (*)    All other components within normal limits  MAGNESIUM   CBC  TROPONIN T, HIGH SENSITIVITY    EKG: None  Radiology: DG Chest 2 View Result Date: 12/07/2024 CLINICAL DATA:  Palpitations. EXAM: CHEST - 2 VIEW COMPARISON:  11/20/2024 FINDINGS: Lungs are adequately inflated without focal airspace consolidation. Mild blunting of the left costophrenic angle likely small amount left pleural fluid. Minimal prominence of the central pulmonary vasculature unchanged and possibly due to mild vascular congestion. Cardiomediastinal silhouette and remainder of the exam is unchanged.  IMPRESSION: Suggestion of mild vascular congestion with small amount left pleural fluid. Electronically Signed   By: Toribio Agreste M.D.   On: 12/07/2024 13:35     Procedures   Medications Ordered in the ED  pindolol  (VISKEN ) tablet 5 mg (has no administration in time range)    Clinical Course as of 12/07/24 2329  Wed Dec 07, 2024  1653 Discussed with cardiology NP April who will evaluate patient in the ED [MP]  1955 Cardiology team including Dr.Ganji  Have completed their evaluation.  Patient will continue on current home meds and they will start pindolol  5 mg twice daily.  Patient mains stable normal sinus rhythm understands need for follow-up with her PCP, cardiology team, A-fib clinic and will pick up her medications from the pharmacy.  Appropriate discharge at this time [MP]  Clinical Course User Index [MP] Pamella Ozell LABOR, DO                                 Medical Decision Making 83 year old female with history as above presents given concern for uncontrolled A-fib at home.  Appears to be normal sinus rhythm in the 50s and 60s here.  Based on her at home log it looks like she has been in the 50s and 60s at home.  Has been repeatedly hypertensive at home and systolic blood pressure in the 170s here.  Stable on room air.  Feels as though she is going in and out of A-fib at home.  Will obtain cardiac workup including labs high-sensitivity troponin BNP chest x-ray EKG and discussed with cardiology team  Amount and/or Complexity of Data Reviewed Labs: ordered. Radiology: ordered.        Final diagnoses:  Atrial fibrillation, unspecified type [facility])  Secondary hypertension    ED Discharge Orders          Ordered    pindolol  (VISKEN ) 5 MG tablet  2 times daily        12/07/24 1844               Pamella Ozell LABOR, DO 12/07/24 2329

## 2024-12-07 NOTE — Telephone Encounter (Signed)
 Dr. Annella, please advise. Rx for Spiriva  filled 12/01/2024, however last note states Stiolto only. According to note from pharmacy PCP d/c Spriva in Nov.   Which inhaler should patient be taking?

## 2024-12-07 NOTE — ED Triage Notes (Signed)
 Patient states that her a-fib has been getting worse  and her Cardiologist sent her. She reports she has been shaking and has been throwing up. Denies chest pain but reports she has back pain.

## 2024-12-07 NOTE — Discharge Instructions (Addendum)
 You were seen in the Emergency Department for high blood pressure and atrial fibrillation Your results today looked okay but it is important that you follow-up with the A-fib clinic, your cardiology team and your primary doctor as discussed please - Continue amiodarone  200 mg daily  - Continue diltiazem  120 mg daily  - Start pindolol  5 mg twice daily  - continue eliquis  5 mg twice daily  - Ordered 14 daily zio to assess for afib recurrence  - Rescheduled afib clinic appointment for 12/30   Return to the emergency room for chest pain trouble breathing or any concerns

## 2024-12-07 NOTE — Telephone Encounter (Signed)
 Patient c/o Palpitations:  STAT if patient reporting lightheadedness, shortness of breath, or chest pain  How long have you had palpitations/irregular HR/ Afib? Are you having the symptoms now?   Yes    Are you currently experiencing lightheadedness, SOB or CP?   SOB  Do you have a history of afib (atrial fibrillation) or irregular heart rhythm?   Yes  Have you checked your BP or HR? (document readings if available):   BP 211/92 HR 67 this morning at 9:00 am 188/107  HR 65 at 9:45 am 166/70  HR 55 last night  Are you experiencing any other symptoms?   Nausea, patient stated her body is vibrating   Patient stated has been in Afib since 12/3 and has been in hospital but is concerned her BP has been trending high.  Patient noted she had Chinese meal last night.

## 2024-12-07 NOTE — Telephone Encounter (Signed)
 Spiriva  is the correct inhaler. We stopped Stiolto via Mychart encounter after development of atrial fibrillation.

## 2024-12-07 NOTE — Consult Note (Signed)
 Cardiology Consultation   Patient ID: Rebekah Harrell MRN: [id number]; DOB: [birth year]  Admit date: 12/07/2024 Date of Consult: 12/07/2024  PCP:  Alvan Dorothyann BIRCH, MD   [facility] Providers Cardiologist:  Redell Shallow, MD  Electrophysiologist:  Will Gladis Norton, MD     Patient Profile: Rebekah Harrell is a 83 y.o. female with a hx of atypical chest pain with negative myoview in 11/2023, persistent atrial fibrillation s/p ablation in 11/2023 on eliquis , chronic diastolic heart failure, mild-moderate MR, HTN, HLD, CKD stage III, COPD, GERD who is being seen 12/07/2024 for the evaluation of elevated BP, palpitations at the request of Dr. Pamella.  History of Present Illness: Rebekah Harrell is an 83 year old female with above medical history who is followed by Dr. Shallow and Dr. Norton.  She has a history of persistent atrial fibrillation which was initially diagnosed in 2017.  She was started on sotalol  and converted to sinus rhythm.  She was noted to be back in atrial fibrillation in 04/2023 and underwent DCCV in 05/2023 followed by an atrial fibrillation ablation in 11/2023.  Sotalol  subsequently had to be stopped during an admission at [facility] in 11/2023 due to prolonged QTc.   She was recently admitted from 11/30-12/3. She had called our office with palpitations/fluttering sensation. Also had intermittent flushed sensation and near syncope. Found to be in atrial flutter with HR in the 160s-170s. She was given IV metoprolol  and converted to NSR with HR in the 50s. However, she then became bradycardic with HR to the 30s. She was seen by EP for tachy-brady syndrome. Started on amiodarone .   Patient tells me that as soon as she was discharged from the hospital, she felt like she was back in afib with a fluttering feeling in her chest. Her HR remained in the 50s-60s. She was started back on diltiazem  per EP. Since then, she has had recurrent episodes of palpitations. She also  has some nausea, though she tells me that this has been ongoing for a year. She was scheduled to see the afib clinic today, but she decided to come to the ED instead. Since coming to the ED, she has been in normal sinus rhythm with HR in the 50s-60s. Her BP has been elevated. I reviewed her home BP and HR log. HR has been well controlled in the 50s-60s. BP has been elevated. She notes that her BP gets elevated if she eats salty foods (particularly chinese food), or caffeine. She currently feels well    Past Medical History:  Diagnosis Date   Allergy     Aortic atherosclerosis    Arthritis    lower back; knees (11/26/2016)   Asthma    Chronic atrial fibrillation (HCC)    CKD (chronic kidney disease), stage III (HCC)    patient unaware of this on 11/26/2016   Complication of anesthesia    had trouble waking me up in the late 1990's after gallbladder OR   COPD (chronic obstructive pulmonary disease) (HCC)    GERD (gastroesophageal reflux disease)    Heart murmur    dx'd when I was a little kid   History of hiatal hernia    Hyperlipidemia    Hypertension    Hypoglycemia     Past Surgical History:  Procedure Laterality Date   ATRIAL FIBRILLATION ABLATION N/A 12/08/2023   Procedure: ATRIAL FIBRILLATION ABLATION;  Surgeon: Norton Soyla Gladis, MD;  Location: [facility] INVASIVE CV LAB;  Service: Cardiovascular;  Laterality: N/A;   CARDIOVERSION N/A  06/10/2023   Procedure: CARDIOVERSION;  Surgeon: Kate Lonni CROME, MD;  Location: [facility] INVASIVE CV LAB;  Service: Cardiovascular;  Laterality: N/A;   CHOLECYSTECTOMY OPEN  1990s   TONSILLECTOMY     TUBAL LIGATION       Scheduled Meds:  Continuous Infusions:  PRN Meds:   Allergies:   Allergies[1]  Social History:   Social History   Socioeconomic History   Marital status: Divorced    Spouse name: Not on file   Number of children: 3   Years of education: 14   Highest education level: Some college, no degree  Occupational History    Occupation: stages houses for sale    Comment: self employed   Occupation: Retired.  Tobacco Use   Smoking status: Never    Passive exposure: Past   Smokeless tobacco: Never  Vaping Use   Vaping status: Never Used  Substance and Sexual Activity   Alcohol use: Not Currently   Drug use: No   Sexual activity: Not Currently  Other Topics Concern   Not on file  Social History Narrative   Lives alone. One son lives close by incase she needs anything. She enjoys crafting and decorating.    Social Drivers of Health   Tobacco Use: Low Risk (12/07/2024)   Patient History    Smoking Tobacco Use: Never    Smokeless Tobacco Use: Never    Passive Exposure: Past  Financial Resource Strain: Medium Risk (06/16/2024)   Overall Financial Resource Strain (CARDIA)    Difficulty of Paying Living Expenses: Somewhat hard  Food Insecurity: No Food Insecurity (11/24/2024)   Epic    Worried About Programme Researcher, Broadcasting/film/video in the Last Year: Never true    Ran Out of Food in the Last Year: Never true  Transportation Needs: No Transportation Needs (11/24/2024)   Epic    Lack of Transportation (Medical): No    Lack of Transportation (Non-Medical): No  Physical Activity: Insufficiently Active (06/16/2024)   Exercise Vital Sign    Days of Exercise per Week: 1 day    Minutes of Exercise per Session: 60 min  Stress: Stress Concern Present (06/16/2024)   [employer] of Occupational Health - Occupational Stress Questionnaire    Feeling of Stress: To some extent  Social Connections: Moderately Isolated (11/20/2024)   Social Connection and Isolation Panel    Frequency of Communication with Friends and Family: More than three times a week    Frequency of Social Gatherings with Friends and Family: Once a week    Attends Religious Services: Never    Database Administrator or Organizations: No    Attends Engineer, Structural: More than 4 times per year    Marital Status: Divorced  Intimate Partner  Violence: Not At Risk (11/24/2024)   Epic    Fear of Current or Ex-Partner: No    Emotionally Abused: No    Physically Abused: No    Sexually Abused: No  Depression (PHQ2-9): Low Risk (06/20/2024)   Depression (PHQ2-9)    PHQ-2 Score: 0  Alcohol Screen: Low Risk (04/28/2023)   Alcohol Screen    Last Alcohol Screening Score (AUDIT): 1  Housing: Unknown (11/24/2024)   Epic    Unable to Pay for Housing in the Last Year: No    Number of Times Moved in the Last Year: Not on file    Homeless in the Last Year: No  Utilities: Not At Risk (11/24/2024)   Epic    Threatened with loss  of utilities: No  Health Literacy: Adequate Health Literacy (01/25/2024)   B1300 Health Literacy    Frequency of need for help with medical instructions: Never    Family History:    Family History  Problem Relation Age of Onset   CAD Mother        HEART ATTACK   Colon cancer Father        KIDNEY FAILURE/COLON CANCER   Atrial fibrillation Brother    Healthy Brother    Heart failure Maternal Grandmother    Kidney failure Maternal Grandfather    Emphysema Maternal Grandfather    Heart attack Paternal Grandmother      ROS:  Please see the history of present illness.  All other ROS reviewed and negative.     Physical Exam/Data: Vitals:   12/07/24 1551 12/07/24 1600 12/07/24 1608 12/07/24 1615  BP: (!) 162/62 (!) 121/100  (!) 176/65  Pulse: (!) 57 (!) 56  60  Resp: 17 (!) 21  12  Temp: 97.8 F (36.6 C)     TempSrc: Oral     SpO2: 99% 97%  94%  Weight:   71.2 kg   Height:   5' 3 (1.6 m)    No intake or output data in the 24 hours ending 12/07/24 1753    12/07/2024    4:08 PM 11/24/2024   12:45 PM 11/20/2024    4:53 PM  Last 3 Weights  Weight (lbs) 157 lb 158 lb 158 lb 4.6 oz  Weight (kg) 71.215 kg 71.668 kg 71.8 kg     Body mass index is 27.81 kg/m.  General:  Well nourished, well developed, in no acute distress. Sitting comfortably in the bed  HEENT: normal Neck: no JVD Vascular: Radial  pulses 2+ bilaterally Cardiac:  normal S1, S2; RRR; Grade 2/6 systolic murmur  Lungs:  clear to auscultation bilaterally, no wheezing, rhonchi or rales. Normal WOB on room air  Abd: soft, nontender  Ext: no edema in BLE  Musculoskeletal:  No deformities  Skin: warm and dry  Neuro:  CNs 2-12 intact, no focal abnormalities noted Psych:  Normal affect   EKG:  The EKG was personally reviewed and demonstrates:  NSR, HR 58 BPM  Telemetry:  Telemetry was personally reviewed and demonstrates:  NSR   Relevant CV Studies: Cardiac Studies & Procedures   ______________________________________________________________________________________________     ECHOCARDIOGRAM  ECHOCARDIOGRAM COMPLETE 11/21/2024  Narrative ECHOCARDIOGRAM REPORT    Patient Name:   Rebekah Harrell Date of Exam: 11/21/2024 Medical Rec #:  [id number]        Height:       63.0 in Accession #:    [id number]       Weight:       158.3 lb Date of Birth:  19-Jun-1941       BSA:          1.751 m Patient Age:    83 years         BP:           119/73 mmHg Patient Gender: F                HR:           112 bpm. Exam Location:  Inpatient  Procedure: 2D Echo, Color Doppler and Cardiac Doppler (Both Spectral and Color Flow Doppler were utilized during procedure).  Indications:    Atrial FLutter I48.92  History:        Patient has prior history of Echocardiogram  examinations, most recent 06/09/2023. COPD, Arrythmias:Atrial Fibrillation; Risk Factors:Hypertension and Dyslipidemia.  Sonographer:    Tinnie Gosling RDCS Referring Phys: CALLIE E GOODRICH  IMPRESSIONS   1. Left ventricular ejection fraction, by estimation, is 60 to 65%. The left ventricle has normal function. The left ventricle has no regional wall motion abnormalities. There is mild left ventricular hypertrophy. Left ventricular diastolic parameters are consistent with Grade II diastolic dysfunction (pseudonormalization). 2. Right ventricular systolic function is  normal. The right ventricular size is not well visualized. 3. Left atrial size was severely dilated. 4. Right atrial size was moderately dilated. 5. The mitral valve was not well visualized. Mild to moderate mitral valve regurgitation. Severe mitral annular calcification. 6. The aortic valve was not well visualized. There is moderate calcification of the aortic valve. Aortic valve regurgitation is not visualized.  Comparison(s): No significant change from prior study.  FINDINGS Left Ventricle: Left ventricular ejection fraction, by estimation, is 60 to 65%. The left ventricle has normal function. The left ventricle has no regional wall motion abnormalities. The left ventricular internal cavity size was normal in size. There is mild left ventricular hypertrophy. Left ventricular diastolic parameters are consistent with Grade II diastolic dysfunction (pseudonormalization).  Right Ventricle: The right ventricular size is not well visualized. Right vetricular wall thickness was not well visualized. Right ventricular systolic function is normal.  Left Atrium: Left atrial size was severely dilated.  Right Atrium: Right atrial size was moderately dilated.  Pericardium: There is no evidence of pericardial effusion.  Mitral Valve: The mitral valve was not well visualized. There is mild thickening of the mitral valve leaflet(s). There is mild calcification of the mitral valve leaflet(s). Severe mitral annular calcification. Mild to moderate mitral valve regurgitation. The mean mitral valve gradient is 4.0 mmHg with average heart rate of 106 bpm.  Tricuspid Valve: The tricuspid valve is normal in structure. Tricuspid valve regurgitation is trivial. No evidence of tricuspid stenosis.  Aortic Valve: Unable to assess aortic stenosis as signal is contaminated by mitral regurgitation. The aortic valve was not well visualized. There is moderate calcification of the aortic valve. Aortic valve regurgitation is  not visualized.  Pulmonic Valve: The pulmonic valve was not well visualized. Pulmonic valve regurgitation is trivial. No evidence of pulmonic stenosis.  Aorta: The aortic root and ascending aorta are structurally normal, with no evidence of dilitation.  IAS/Shunts: The atrial septum is grossly normal.   LEFT VENTRICLE PLAX 2D LVIDd:         5.30 cm   Diastology LVIDs:         2.80 cm   LV e' medial:    7.83 cm/s LV PW:         1.20 cm   LV E/e' medial:  18.9 LV IVS:        1.20 cm   LV e' lateral:   10.90 cm/s LVOT diam:     1.90 cm   LV E/e' lateral: 13.6 LV SV:         41 LV SV Index:   24 LVOT Area:     2.84 cm LV IVRT:       103 msec   RIGHT VENTRICLE             IVC RV S prime:     15.40 cm/s  IVC diam: 1.80 cm TAPSE (M-mode): 1.5 cm  LEFT ATRIUM              Index  RIGHT ATRIUM           Index LA diam:        4.80 cm  2.74 cm/m   RA Area:     19.70 cm LA Vol (A2C):   118.0 ml 67.40 ml/m  RA Volume:   67.70 ml  38.67 ml/m LA Vol (A4C):   95.6 ml  54.61 ml/m LA Biplane Vol: 109.0 ml 62.26 ml/m AORTIC VALVE LVOT Vmax:   85.20 cm/s LVOT Vmean:  55.800 cm/s LVOT VTI:    0.146 m  AORTA Ao Root diam: 2.60 cm Ao Asc diam:  3.10 cm  MITRAL VALVE MV Area (PHT): 4.04 cm     SHUNTS MV Mean grad:  4.0 mmHg     Systemic VTI:  0.15 m MV Decel Time: 188 msec     Systemic Diam: 1.90 cm MV E velocity: 148.00 cm/s MV A velocity: 38.60 cm/s MV E/A ratio:  3.83  Shelda Bruckner MD Electronically signed by Shelda Bruckner MD Signature Date/Time: 11/21/2024/2:41:58 PM    Final          ______________________________________________________________________________________________       Laboratory Data: High Sensitivity Troponin:   Recent Labs  Lab 11/20/24 1822 11/20/24 2028  TROPONINIHS 24* 26*    Recent Labs  Lab 11/20/24 1413 12/07/24 1238  TRNPT 16 16      Chemistry Recent Labs  Lab 12/07/24 1238 12/07/24 1607  NA 138  --    K 3.8  --   CL 104  --   CO2 20*  --   GLUCOSE 100*  --   BUN 13  --   CREATININE 1.20*  --   CALCIUM  9.3  --   MG  --  2.3  GFRNONAA 45*  --   ANIONGAP 14  --     No results for input(s): PROT, ALBUMIN, AST, ALT, ALKPHOS, BILITOT in the last 168 hours. Lipids No results for input(s): CHOL, TRIG, HDL, LABVLDL, LDLCALC, CHOLHDL in the last 168 hours.  Hematology Recent Labs  Lab 12/07/24 1238  WBC 9.4  RBC 4.55  HGB 13.0  HCT 39.7  MCV 87.3  MCH 28.6  MCHC 32.7  RDW 16.3*  PLT 244   Thyroid  No results for input(s): TSH, FREET4 in the last 168 hours.  BNP Recent Labs  Lab 12/07/24 1238  PROBNP 1,376.0*    DDimer No results for input(s): DDIMER in the last 168 hours.  Radiology/Studies:  DG Chest 2 View Result Date: 12/07/2024 CLINICAL DATA:  Palpitations. EXAM: CHEST - 2 VIEW COMPARISON:  11/20/2024 FINDINGS: Lungs are adequately inflated without focal airspace consolidation. Mild blunting of the left costophrenic angle likely small amount left pleural fluid. Minimal prominence of the central pulmonary vasculature unchanged and possibly due to mild vascular congestion. Cardiomediastinal silhouette and remainder of the exam is unchanged. IMPRESSION: Suggestion of mild vascular congestion with small amount left pleural fluid. Electronically Signed   By: Toribio Agreste M.D.   On: 12/07/2024 13:35     Assessment and Plan:  Persistent Atrial Fibrillation  Palpitations  Tachy-brady  - Patient previously had afib ablation in 11/2023. Had recurrent afib in late November. Had evidence of tachy-brady. Started on amiodarone  and converted to NSR at that time - Now, patient presents with episodes of palpitations and elevated BP. Her HR has been well controlled and stable in the 50s-60s. EKG and tele here show NSR  - Continue amiodarone  200 mg daily  - Continue diltiazem  120 mg daily  -  Start pindolol  5 mg BID  - continue eliquis  5 mg BID  -  Ordered 14 daily zio to assess for afib recurrence  - Rescheduled afib clinic appointment for 12/30   HTN  - BP elevated here in the ED and has been elevated at home. She notes that her BP gets highest if she eats salty foods, sweets/chocolate, or has caffeine. Discussed dietary changes  - Start pindolol  5 mg BID  - Of note- patient has an allergy  to ACEi (swelling), yellow dye   Chronic Diastolic heart failure  - Euvolemic on exam. Pro BNP 1376, which is below her age cut off  - Continue farxiga  10 mg daily     Risk Assessment/Risk Scores:   CHA2DS2-VASc Score = 6   This indicates a 9.7% annual risk of stroke. The patient's score is based upon: CHF History: 1 HTN History: 1 Diabetes History: 0 Stroke History: 0 Vascular Disease History: 1 (calcifications noted on CT) Age Score: 2 Gender Score: 1     For questions or updates, please contact [facility] Please consult [email or web address] for contact info under    Signed, Rollo FABIENE Louder, PA-C  12/07/2024 5:53 PM     [1]  Allergies Allergen Reactions   Ace Inhibitors Swelling   Arnuity Ellipta  [Fluticasone  Furoate] Other (See Comments)    Vocal hoarseness   Lipitor [Atorvastatin] Other (See Comments)    Myalgias   Tape Itching, Dermatitis and Other (See Comments)    Localized redness at removal site  Reaction occurred with the removal of paper tape   Yellow Dyes (Non-Tartrazine) Other (See Comments)    Allergy  test   Zocor  [Simvastatin ] Other (See Comments)    Myalgias   Zyrtec [Cetirizine] Other (See Comments)    Fatigue    Celebrex [Celecoxib] Other (See Comments)    Hair loss   Penicillins Other (See Comments)    Unknown childhood reaction    Sulfonamide Derivatives Rash

## 2024-12-07 NOTE — Telephone Encounter (Signed)
 noted

## 2024-12-08 ENCOUNTER — Telehealth: Payer: Self-pay | Admitting: Cardiology

## 2024-12-08 ENCOUNTER — Other Ambulatory Visit: Payer: Self-pay

## 2024-12-08 ENCOUNTER — Ambulatory Visit: Attending: Cardiology

## 2024-12-08 DIAGNOSIS — I4819 Other persistent atrial fibrillation: Secondary | ICD-10-CM

## 2024-12-08 NOTE — Telephone Encounter (Signed)
 Pt c/o medication issue:  1. Name of Medication:   pindolol  (VISKEN ) 5 MG tablet   2. How are you currently taking this medication (dosage and times per day)?   3. Are you having a reaction (difficulty breathing--STAT)?   4. What is your medication issue?   Patient stated she was prescribed this medication but her insurance does not cover it.  Patient wants to get alternate medication.

## 2024-12-08 NOTE — Progress Notes (Unsigned)
 Enrolled for [employer] to mail a ZIO XT long term holter monitor to the patients address on file.   Dr. Jens Som to read.

## 2024-12-08 NOTE — Patient Instructions (Signed)
 Visit Information  Thank you for taking time to visit with me today. Please don't hesitate to contact me if I can be of assistance to you before our next scheduled telephone appointment.  Our next appointment is by telephone on Monday December 29th at 1:00pm  Following is a copy of your care plan:   Goals Addressed             This Visit's Progress    VBCI Transitions of Care (TOC) Care Plan       Problems: (reviewed 12/08/24) Recent Hospitalization for treatment of Atrial Fibrillation Hospital or Ed Admit Risk of 93%  Goal: (reviewed 12/08/24) Over the next 30 days, the patient will not experience hospital readmission  Interventions: (reviewed 12/08/24)  AFIB Interventions:   Counseled on increased risk of stroke due to Afib and benefits of anticoagulation for stroke prevention Reviewed importance of adherence to anticoagulant exactly as prescribed Counseled on bleeding risk associated with Eliquis  and importance of self-monitoring for signs/symptoms of bleeding Counseled on avoidance of NSAIDs due to increased bleeding risk with anticoagulants Counseled on importance of regular laboratory monitoring as prescribed Screening for signs and symptoms of depression related to chronic disease state  Fall Precautions Avoid sharp objects like razors, broken glass. Cautions with use of knives and scissors 11/29/24 - Restart Diltiazem  120mg  daily Take blood pressure twice daily 12/18 - The patient went to the ED on 12/07/24 for symptoms related to high BP and A-fib. The patient was started on Pindolol  5mg  daily but her insurance doesn't pay for it and she is looking for an alternative Amiodarone  200mg  daily   Patient Self Care Activities: (reviewed 12/08/24) Attend all scheduled provider appointments Attend church or other social activities Call provider office for new concerns or questions  Notify RN Care Manager of TOC call rescheduling needs Participate in Transition of Care  Program/Attend [facility] scheduled calls Perform all self care activities independently  Take medications as prescribed    Plan:  Telephone follow up appointment with care management team member scheduled for:   December 29th at 1:00pm        Patient verbalizes understanding of instructions and care plan provided today and agrees to view in MyChart. Active MyChart status and patient understanding of how to access instructions and care plan via MyChart confirmed with patient.     The patient has been provided with contact information for the care management team and has been advised to call with any health related questions or concerns.   Please call the care guide team at [phone] if you need to cancel or reschedule your appointment.   Please call the Suicide and Crisis Lifeline: 988 call the [place]  National Suicide Prevention Lifeline: (928)065-3568 or TTY: 843 245 7603 TTY 409-561-8009) to talk to a trained counselor if you are experiencing a Mental Health or Behavioral Health Crisis or need someone to talk to.  Medford Balboa, BSN, RN [place]  VBCI - [employer] [facility] RN Care Manager [phone]

## 2024-12-08 NOTE — Telephone Encounter (Signed)
 Spoke with patient in regards to Pindolol . Pt stating her insurance doesn't cover this and would like to switch to an alternate. Will send to pharmacy, Dr. Pietro and Callie, PA for further advisement. Pt verbalizes understanding of plan.

## 2024-12-08 NOTE — Transitions of Care (Post Inpatient/ED Visit) (Signed)
 Transition of Care week 3  Visit Note  12/08/2024  Name: Rebekah Harrell MRN: [id number]          DOB: Dec 24, 1940  Situation: Patient enrolled in [facility] 30-day program. Visit completed with Ludivina Bucco by telephone.   Background:   Initial Transition Care Management Follow-up Telephone Call Discharge Date and Diagnosis: 11/23/24, A-fib; Chemical Conversion   Past Medical History:  Diagnosis Date   Allergy     Aortic atherosclerosis    Arthritis    lower back; knees (11/26/2016)   Asthma    Chronic atrial fibrillation (HCC)    CKD (chronic kidney disease), stage III (HCC)    patient unaware of this on 11/26/2016   Complication of anesthesia    had trouble waking me up in the late 1990's after gallbladder OR   COPD (chronic obstructive pulmonary disease) (HCC)    GERD (gastroesophageal reflux disease)    Heart murmur    dx'd when I was a little kid   History of hiatal hernia    Hyperlipidemia    Hypertension    Hypoglycemia     Assessment: Patient Reported Symptoms: Cognitive Cognitive Status: Alert and oriented to person, place, and time, Normal speech and language skills      Neurological Neurological Review of Symptoms: No symptoms reported    HEENT HEENT Symptoms Reported: No symptoms reported      Cardiovascular Cardiovascular Symptoms Reported: Irregular pulse, Palpitations, Lightheadness Does patient have uncontrolled Hypertension?: Yes Is patient checking Blood Pressure at home?: Yes Patient's Recent BP reading at home: 181/76. The patient went to the ED on the advice from the cardiology clinic regarding palpatations and high blood pressure. The provider started her on pindolol  5mg  but her insurance doesn't cover it and she is waiting to see if the provider will call in something else Cardiovascular Management Strategies: Medical device, Routine screening, Medication therapy, Coping strategies Cardiovascular Comment: The patient went to the ED on the  advice from the cardiology clinic regarding palpatations and high blood pressure. The provider started her on pindolol  5mg  but her insurance doesn't cover it and she is waiting to see if the provider will call in something else  Respiratory Respiratory Symptoms Reported: Shortness of breath Additional Respiratory Details: The patient reports SOB with exertion. She states she cannot walk far without losing her breath. This prevents her from performing exercise Respiratory Management Strategies: Routine screening, Medication therapy, Adequate rest  Endocrine Endocrine Symptoms Reported: No symptoms reported Is patient diabetic?: No    Gastrointestinal Gastrointestinal Symptoms Reported: Change in appetite Additional Gastrointestinal Details: The patient states she does not have an appetite and she has not been interested in food for almost two years. She states she is not losing weight though Gastrointestinal Management Strategies: Coping strategies Gastrointestinal Comment: Recommended protein drinks and bars    Genitourinary Genitourinary Symptoms Reported: No symptoms reported    Integumentary Integumentary Symptoms Reported: No symptoms reported    Musculoskeletal Musculoskelatal Symptoms Reviewed: Limited mobility Musculoskeletal Management Strategies: Routine screening, Adequate rest Musculoskeletal Comment: The patient does not have stamina to walk far   Patient at Risk for Falls Due to: History of fall(s)  Psychosocial Psychosocial Symptoms Reported: No symptoms reported         There were no vitals filed for this visit.    Medications Reviewed Today     Reviewed by Moises Reusing, RN (Case Manager) on 12/08/24 at 1307  Med List Status: <None>   Medication Order Taking? Sig Documenting Provider Last  Dose Status Informant  amiodarone  (PACERONE ) 200 MG tablet [id number]  Take 2 tablets (400 mg total) by mouth 2 (two) times daily for 14 days. Leverne Charlies Helling, PA-C  Expired  12/07/24 2359   amiodarone  (PACERONE ) 200 MG tablet [id number]  Take 1 tablet (200 mg total) by mouth daily. Ursuy, Renee Lynn, PA-C  Active   CALCIUM  PO [id number]  Take 1 tablet by mouth daily. [provider]  Active Self, Pharmacy Records  Cholecalciferol (VITAMIN D-3 PO) [id number]  Take 1 capsule by mouth daily. [provider]  Active Self, Pharmacy Records  dapagliflozin  propanediol (FARXIGA ) 10 MG TABS tablet [id number]  TAKE 1 TABLET EVERY DAY BEFORE BREAKFAST Goodrich, Callie E, PA-C  Active   diltiazem  (CARDIZEM  CD) 120 MG 24 hr capsule [id number]  Take 1 capsule (120 mg total) by mouth daily. Inocencio Soyla Lunger, MD  Active   ELIQUIS  5 MG TABS tablet [id number]  TAKE 1 TABLET TWICE DAILY Pietro Redell RAMAN, MD  Active Self, Pharmacy Records  famotidine  (PEPCID ) 20 MG tablet [id number]  TAKE 1 TABLET AT BEDTIME Alvan Dorothyann BIRCH, MD  Active Self, Pharmacy Records  furosemide  (LASIX ) 20 MG tablet [id number]  Take 1 tablet (20 mg total) by mouth daily. Goodrich, Callie E, PA-C  Active Self, Pharmacy Records  MAGNESIUM  PO [id number]  Take 1 tablet by mouth daily. [provider]  Active Self, Pharmacy Records  montelukast  (SINGULAIR ) 10 MG tablet [id number]  Take 1 tablet (10 mg total) by mouth at bedtime. Alvan Dorothyann BIRCH, MD  Active Self, Pharmacy Records  Multiple Vitamins-Minerals (MULTIVITAMIN WOMEN 50+) TABS [id number]  Take 1 tablet by mouth daily. [provider]  Active Self, Pharmacy Records  Multiple Vitamins-Minerals (ZINC PO) [id number]  Take 1 tablet by mouth daily. [provider]  Active Self, Pharmacy Records  omeprazole  [facility]) 20 MG capsule [id number]  TAKE 1 CAPSULE EVERY DAY Alvan Dorothyann BIRCH, MD  Active Self, Pharmacy Records  pindolol  (VISKEN ) 5 MG tablet [id number]  Take 1 tablet (5 mg total) by mouth 2 (two) times daily.  Patient not taking: Reported on 12/08/2024   Johnson, Kathleen R, PA-C  Active   potassium chloride   SA (KLOR-CON  M) 20 MEQ tablet [id number]  TAKE 1 TABLET EVERY DAY Alvan Dorothyann BIRCH, MD  Active Self, Pharmacy Records  rosuvastatin  (CRESTOR ) 20 MG tablet [id number]  TAKE 1 TABLET EVERY DAY  Patient taking differently: Take 20 mg by mouth at bedtime.   Pietro Redell RAMAN, MD  Active Self, Pharmacy Records  Tiotropium Bromide (SPIRIVA  RESPIMAT) 1.25 MCG/ACT AERS [id number]  INHALE 2 PUFFS EVERY DAY Hunsucker, Donnice SAUNDERS, MD  Active   Tiotropium Bromide-Olodaterol (STIOLTO RESPIMAT ) 2.5-2.5 MCG/ACT AERS [id number]  Inhale 2 puffs into the lungs daily.  Patient not taking: Reported on 11/29/2024   Alvan Dorothyann BIRCH, MD  Active Self, Pharmacy Records           Med Note [facility], JON HERO   Mon Nov 21, 2024  2:58 PM) Patient concerned this new inhaler caused her current heart-rate issues.  traMADol  (ULTRAM ) 50 MG tablet [id number]  Take 1 tablet (50 mg total) by mouth every 12 (twelve) hours as needed for moderate pain (pain score 4-6).  Patient taking differently: Take 50 mg by mouth in the morning and at bedtime.   Alvan Dorothyann BIRCH, MD  Active Self, Pharmacy Records            Recommendation:   Continue Current Plan of Care  Follow Up Plan:  Telephone follow-up in 1 week  Medford Balboa, BSN, RN [place]  VBCI - [facility] RN Care Manager [phone]

## 2024-12-09 ENCOUNTER — Telehealth: Payer: Self-pay | Admitting: Cardiology

## 2024-12-09 MED ORDER — ATENOLOL 25 MG PO TABS: 25.0000 mg | ORAL_TABLET | Freq: Every day | ORAL | 3 refills | Status: DC

## 2024-12-09 NOTE — Telephone Encounter (Signed)
 Pt c/o medication issue:  1. Name of Medication: amiodarone  (PACERONE ) 200 MG tablet   2. How are you currently taking this medication (dosage and times per day)?    3. Are you having a reaction (difficulty breathing--STAT)? no  4. What is your medication issue? Patient calling with question in regards to the dosage of this medication. Please advise

## 2024-12-09 NOTE — Telephone Encounter (Signed)
 Spoke with pt who is asking if she is now to be on Amiodarone  200mg  daily.  Advised of recommendation per Dr Almetta as below regarding Amiodarone .  Pt verbalizes understanding an thanked CHARITY FUNDRAISER for the call.  My Assessment and Plan: 83 y.o. female with persistent AF s/p PFA PVI with PWI and RF CTI (12/08/23), HTN, HLD, CKD 3, and COPD who was evaluated for SSS and management of AF/RVR.   SSS AF/RVR COPD Bifascicular block  Now maintaining sinus rhythm on Amio load.  Chemically cardioverted on 11/22/2024.  Will continue outpatient amiodarone  load with 400 mg twice daily x 2 weeks followed by 200 mg daily thereafter.  Plan for decreasing amiodarone  if no recurrent arrhythmias follow-up with Dr. Inocencio.  She has done well in the past on sotalol  which was stopped following her ablation in 11/2023 when she was hospitalized for PNA. Qtc and CKD not ideal for Tikosyn.  Despite her age she is very functional and could be considered for redo ablation.  For now we will continue amiodarone  and see what her arrhythmia burden is as an outpatient.   I spent 25 minutes seeing this patient. During that time I reviewed their history, evaluated their symptoms, reviewed available labs, EKGs, studies, performed an exam and formulated an assessment and plan   Signed,  Donnice DELENA Almetta, MD  11/23/2024 10:44 AM

## 2024-12-09 NOTE — Addendum Note (Signed)
 Addended by: King Pinzon W on: 12/09/2024 09:41 AM   Modules accepted: Orders

## 2024-12-09 NOTE — Telephone Encounter (Signed)
Spoke with pt, Aware of dr crenshaw's recommendations. New script sent to the pharmacy  

## 2024-12-13 ENCOUNTER — Other Ambulatory Visit: Payer: Self-pay | Admitting: Cardiology

## 2024-12-13 DIAGNOSIS — I340 Nonrheumatic mitral (valve) insufficiency: Secondary | ICD-10-CM

## 2024-12-16 ENCOUNTER — Other Ambulatory Visit (HOSPITAL_COMMUNITY): Payer: Self-pay

## 2024-12-16 MED ORDER — HYDRALAZINE HCL 25 MG PO TABS
25.0000 mg | ORAL_TABLET | Freq: Three times a day (TID) | ORAL | 1 refills | Status: DC
  Filled 2024-12-16 – 2024-12-20 (×2): qty 90, 30d supply, fill #0

## 2024-12-16 NOTE — Telephone Encounter (Signed)
 Called patient about her last message.  She stated she is filling better once she stopped the atenolol . She has an allergy  to yellow dye, so I told her once she gets the prescription to look at the ingredients and make sure there is no yellow dye. She said that she cannot do yellow capsules, but the hydralazine  is a tablet. She said she has taken yellow pills before, and has been fine. Will send to Dr. Barbaraann and her general cardiologist for further advisement.

## 2024-12-19 ENCOUNTER — Encounter (HOSPITAL_COMMUNITY): Payer: Self-pay

## 2024-12-19 ENCOUNTER — Other Ambulatory Visit (HOSPITAL_COMMUNITY): Payer: Self-pay

## 2024-12-19 ENCOUNTER — Other Ambulatory Visit: Payer: Self-pay

## 2024-12-19 NOTE — Patient Instructions (Signed)
 Visit Information  Thank you for taking time to visit with me today. Please don't hesitate to contact me if I can be of assistance to you before our next scheduled telephone appointment.  Our next appointment is by telephone on Thursday January 8th at 1:00pm  Following is a copy of your care plan:   Goals Addressed             This Visit's Progress    VBCI Transitions of Care (TOC) Care Plan       Problems: (reviewed 12/19/24) Recent Hospitalization for treatment of Atrial Fibrillation Hospital or Ed Admit Risk of 93%  Goal: (reviewed 12/19/24) Over the next 30 days, the patient will not experience hospital readmission  Interventions: (reviewed 12/19/24)  AFIB Interventions:   Counseled on increased risk of stroke due to Afib and benefits of anticoagulation for stroke prevention Reviewed importance of adherence to anticoagulant exactly as prescribed Counseled on bleeding risk associated with Eliquis  and importance of self-monitoring for signs/symptoms of bleeding Counseled on avoidance of NSAIDs due to increased bleeding risk with anticoagulants Counseled on importance of regular laboratory monitoring as prescribed Screening for signs and symptoms of depression related to chronic disease state  Fall Precautions Avoid sharp objects like razors, broken glass. Cautions with use of knives and scissors 11/29/24 - Restart Diltiazem  120mg  daily Take blood pressure twice daily 12/18 - The patient went to the ED on 12/07/24 for symptoms related to high BP and A-fib. The patient was started on Pindolol  5mg  daily but her insurance doesn't pay for it and she is looking for an alternative Amiodarone  200mg  daily  12/29 - The patient was started on Atenolol  but she had side effects and it was stopped and she has been started on Hydralazine  25 mg three times a day but hasn't started it yet. She sees the PCP on 12/30  Patient Self Care Activities: (reviewed 12/19/24) Attend all scheduled  provider appointments Attend church or other social activities Call provider office for new concerns or questions  Notify RN Care Manager of TOC call rescheduling needs Participate in Transition of Care Program/Attend [facility] scheduled calls Perform all self care activities independently  Take medications as prescribed    Plan:  Telephone follow up appointment with care management team member scheduled for:   Thursday  January 8th at 1:00pm        Patient verbalizes understanding of instructions and care plan provided today and agrees to view in MyChart. Active MyChart status and patient understanding of how to access instructions and care plan via MyChart confirmed with patient.     The patient has been provided with contact information for the care management team and has been advised to call with any health related questions or concerns.   Please call the care guide team at [phone] if you need to cancel or reschedule your appointment.   Please call the Suicide and Crisis Lifeline: 988 call the [place]  National Suicide Prevention Lifeline: (914)693-4459 or TTY: 765-007-0319 TTY 539-116-6090) to talk to a trained counselor if you are experiencing a Mental Health or Behavioral Health Crisis or need someone to talk to.  Medford Balboa, BSN, RN [place]  VBCI - [facility] RN Care Manager [phone]

## 2024-12-19 NOTE — Transitions of Care (Post Inpatient/ED Visit) (Signed)
 " Transition of Care week 4  Visit Note  12/19/2024  Name: Rebekah Harrell MRN: [id number]          DOB: 1941/08/18  Situation: Patient enrolled in [facility] 30-day program. Visit completed with Ludivina Bucco by telephone.   Background:   Initial Transition Care Management Follow-up Telephone Call Discharge Date and Diagnosis: 11/23/24, A-fib; Chemical Conversion   Past Medical History:  Diagnosis Date   Allergy     Aortic atherosclerosis    Arthritis    lower back; knees (11/26/2016)   Asthma    Chronic atrial fibrillation (HCC)    CKD (chronic kidney disease), stage III (HCC)    patient unaware of this on 11/26/2016   Complication of anesthesia    had trouble waking me up in the late 1990's after gallbladder OR   COPD (chronic obstructive pulmonary disease) (HCC)    GERD (gastroesophageal reflux disease)    Heart murmur    dx'd when I was a little kid   History of hiatal hernia    Hyperlipidemia    Hypertension    Hypoglycemia     Assessment: Patient Reported Symptoms: Cognitive Cognitive Status: Alert and oriented to person, place, and time, Normal speech and language skills      Neurological Neurological Review of Symptoms: Not assessed    HEENT HEENT Symptoms Reported: Not assessed      Cardiovascular Cardiovascular Symptoms Reported: Irregular pulse, Lightheadness, Dizziness, Fatigue Does patient have uncontrolled Hypertension?: Yes Is patient checking Blood Pressure at home?: Yes Patient's Recent BP reading at home: Most recent readings include 171/67, 166/66, 175/68, 141/61 Cardiovascular Management Strategies: Medication therapy, Routine screening, Medical device, Adequate rest Cardiovascular Comment: The patient was started on Atenolol  which she took for two days and it made her very weak and dizzy. She the cardiologist who started her on Hydralazine  25mg  three times a day. She has not picked it up from the pharmacy. She meets with her PCP 12/20/24 and her  Cardiologist 12/28/2024  Respiratory Respiratory Symptoms Reported: Shortness of breath Additional Respiratory Details: SOB with exertion Respiratory Management Strategies: Routine screening, Medication therapy, Adequate rest  Endocrine Endocrine Symptoms Reported: No symptoms reported Is patient diabetic?: No    Gastrointestinal Gastrointestinal Symptoms Reported: Not assessed      Genitourinary      Integumentary Integumentary Symptoms Reported: Not assessed    Musculoskeletal Musculoskelatal Symptoms Reviewed: Limited mobility, Weakness Musculoskeletal Management Strategies: Routine screening, Adequate rest      Psychosocial Psychosocial Symptoms Reported: No symptoms reported         There were no vitals filed for this visit.    Medications Reviewed Today     Reviewed by Moises Reusing, RN (Case Manager) on 12/19/24 at 1337  Med List Status: <None>   Medication Order Taking? Sig Documenting Provider Last Dose Status Informant  amiodarone  (PACERONE ) 200 MG tablet [id number]  Take 2 tablets (400 mg total) by mouth 2 (two) times daily for 14 days. Leverne Charlies Helling, PA-C  Expired 12/07/24 2359   amiodarone  (PACERONE ) 200 MG tablet [id number]  Take 1 tablet (200 mg total) by mouth daily. Leverne Charlies Helling, PA-C  Active   atenolol  (TENORMIN ) 25 MG tablet [id number]  Take 1 tablet (25 mg total) by mouth at bedtime. Pietro Redell RAMAN, MD  Active   CALCIUM  PO [id number] No Take 1 tablet by mouth daily. [provider] 11/19/2024 Active Self, Pharmacy Records  Cholecalciferol (VITAMIN D-3 PO) [id number] No Take 1 capsule by mouth daily. [provider] 11/19/2024 Active Self, Pharmacy Records  dapagliflozin  propanediol (FARXIGA ) 10 MG TABS tablet [id number]  TAKE 1 TABLET EVERY DAY BEFORE BREAKFAST Goodrich, Callie E, PA-C  Active   diltiazem  (CARDIZEM  CD) 120 MG 24 hr capsule [id number]  Take 1 capsule (120 mg total) by mouth daily. Inocencio Soyla Lunger, MD  Active    ELIQUIS  5 MG TABS tablet [id number] No TAKE 1 TABLET TWICE DAILY Pietro Redell RAMAN, MD 11/20/2024  9:30 AM Active Self, Pharmacy Records  famotidine  (PEPCID ) 20 MG tablet [id number] No TAKE 1 TABLET AT BEDTIME Alvan Dorothyann BIRCH, MD 11/19/2024 Active Self, Pharmacy Records  furosemide  (LASIX ) 20 MG tablet [id number] No Take 1 tablet (20 mg total) by mouth daily. Goodrich, Callie E, PA-C 11/20/2024 Morning Active Self, Pharmacy Records  hydrALAZINE  (APRESOLINE ) 25 MG tablet [id number]  Take 1 tablet (25 mg total) by mouth 3 (three) times daily. Barbaraann Darryle Ned, MD  Active   MAGNESIUM  PO [id number] No Take 1 tablet by mouth daily. [provider] 11/19/2024 Active Self, Pharmacy Records  montelukast  (SINGULAIR ) 10 MG tablet [id number] No Take 1 tablet (10 mg total) by mouth at bedtime. Alvan Dorothyann BIRCH, MD 11/19/2024 Active Self, Pharmacy Records  Multiple Vitamins-Minerals (MULTIVITAMIN WOMEN 50+) TABS [id number] No Take 1 tablet by mouth daily. [provider] 11/19/2024 Active Self, Pharmacy Records  Multiple Vitamins-Minerals (ZINC PO) [id number] No Take 1 tablet by mouth daily. [provider] 11/19/2024 Active Self, Pharmacy Records  omeprazole  [facility]) 20 MG capsule [id number] No TAKE 1 CAPSULE EVERY DAY Alvan Dorothyann BIRCH, MD 11/20/2024 Morning Active Self, Pharmacy Records  potassium chloride  SA (KLOR-CON  M) 20 MEQ tablet [id number] No TAKE 1 TABLET EVERY DAY Alvan Dorothyann BIRCH, MD 11/20/2024 Morning Active Self, Pharmacy Records  rosuvastatin  (CRESTOR ) 20 MG tablet [id number] No TAKE 1 TABLET EVERY DAY  Patient taking differently: Take 20 mg by mouth at bedtime.   Pietro Redell RAMAN, MD 11/19/2024 Active Self, Pharmacy Records  Tiotropium Bromide (SPIRIVA  RESPIMAT) 1.25 MCG/ACT AERS [id number]  INHALE 2 PUFFS EVERY DAY Hunsucker, Donnice SAUNDERS, MD  Active   Tiotropium Bromide-Olodaterol (STIOLTO RESPIMAT ) 2.5-2.5 MCG/ACT AERS [id number]  Inhale 2 puffs into the  lungs daily.  Patient not taking: Reported on 11/29/2024   Alvan Dorothyann BIRCH, MD  Active Self, Pharmacy Records           Med Note [facility], JON HERO   Mon Nov 21, 2024  2:58 PM) Patient concerned this new inhaler caused her current heart-rate issues.  traMADol  (ULTRAM ) 50 MG tablet [id number] No Take 1 tablet (50 mg total) by mouth every 12 (twelve) hours as needed for moderate pain (pain score 4-6).  Patient taking differently: Take 50 mg by mouth in the morning and at bedtime.   Alvan Dorothyann BIRCH, MD 11/20/2024 Morning Active Self, Pharmacy Records            Recommendation:   Continue Current Plan of Care  Follow Up Plan:   Telephone follow-up in 1 week  Medford Balboa, BSN, RN [place]  VBCI - [facility] RN Care Manager [phone]     "

## 2024-12-20 ENCOUNTER — Other Ambulatory Visit (HOSPITAL_COMMUNITY): Payer: Self-pay

## 2024-12-20 ENCOUNTER — Encounter: Payer: Self-pay | Admitting: Family Medicine

## 2024-12-20 ENCOUNTER — Other Ambulatory Visit: Payer: Self-pay

## 2024-12-20 ENCOUNTER — Ambulatory Visit: Admitting: Nurse Practitioner

## 2024-12-20 ENCOUNTER — Ambulatory Visit (INDEPENDENT_AMBULATORY_CARE_PROVIDER_SITE_OTHER): Admitting: Family Medicine

## 2024-12-20 ENCOUNTER — Ambulatory Visit (HOSPITAL_COMMUNITY): Admitting: Physician Assistant

## 2024-12-20 VITALS — BP 144/53 | HR 53 | Ht 63.0 in | Wt 159.0 lb

## 2024-12-20 DIAGNOSIS — I4891 Unspecified atrial fibrillation: Secondary | ICD-10-CM

## 2024-12-20 DIAGNOSIS — I10 Essential (primary) hypertension: Secondary | ICD-10-CM

## 2024-12-20 DIAGNOSIS — J449 Chronic obstructive pulmonary disease, unspecified: Secondary | ICD-10-CM | POA: Diagnosis not present

## 2024-12-20 DIAGNOSIS — N183 Chronic kidney disease, stage 3 unspecified: Secondary | ICD-10-CM | POA: Diagnosis not present

## 2024-12-20 DIAGNOSIS — I4892 Unspecified atrial flutter: Secondary | ICD-10-CM

## 2024-12-20 NOTE — Progress Notes (Signed)
 "  Established Patient Office Visit  Patient ID: Rebekah Harrell, female    DOB: October 22, 1941  Age: 83 y.o. MRN: [id number] PCP: Alvan Dorothyann BIRCH, MD  No chief complaint on file.   Subjective:     HPI  Discussed the use of AI scribe software for clinical note transcription with the patient, who gave verbal consent to proceed.  History of Present Illness Rebekah Harrell is an 83 year old female with hypertension and atrial fibrillation who presents for follow-up of her conditions.  Cardiac arrhythmia and rate control - History of atrial fibrillation with recent emergency room visit on December 17th for atrial fibrillation with flutter. - Previously started on amiodarone  and diltiazem ; already taking Eliquis . - During emergency room visit, in normal sinus rhythm with heart rate in the fifties and sixties. - Switched to pindolol , but unable to obtain due to insurance coverage; cardiologist recommended atenolol  25 mg. - Experienced increased shortness of breath on atenolol  and discontinued medication. - Previously on metoprolol , discontinued due to poor tolerance. - Currently on day seven of a fourteen-day heart monitor.  Hypertension - Blood pressure fluctuating between the one forties and close to two hundred. - Blood pressure was in the two hundreds in the middle of the month, which is unusual as baseline is typically around 132. - Caffeine intake, such as diet Pepsi or mocha frappe, causes significant blood pressure elevation, which was not the case three months ago. - Prescribed hydralazine , but experienced a delay in receiving it due to delivery issues. Hasn't started it yet.   Pulmonary symptoms - Experiencing increased shortness of breath and difficulty breathing. - No cough, no increased mucus production, no heartburn or reflux. - Pulmonary symptoms similar to those experienced a year ago when feeling unwell. - Currently using Spiriva  after trial of another  inhaler that was ineffective.  Nasal symptoms - Chronic nasal drip since nasal fracture in April, perceived to affect breathing.  Renal function - Recent serum creatinine approximately 1.2, close to baseline. - Glomerular filtration rate (GFR) of 45.  Imaging and historical findings - Chest x-ray on December 17th and PET scan in November showed no significant changes in lungs or heart. - PET scan revealed diverticulitis in the left lower intestine. - History of a stable spot on the lung since childhood, with no significant change noted.  No recent LE swelling. She only takes her furosemide  once a day, not BID bc controlled swelling and dry mouth.   EXAM: CHEST - 2 VIEW   COMPARISON:  11/20/2024   FINDINGS: Lungs are adequately inflated without focal airspace consolidation. Mild blunting of the left costophrenic angle likely small amount left pleural fluid. Minimal prominence of the central pulmonary vasculature unchanged and possibly due to mild vascular congestion. Cardiomediastinal silhouette and remainder of the exam is unchanged.   IMPRESSION: Suggestion of mild vascular congestion with small amount left pleural fluid.     ROS    Objective:     BP (!) 144/53   Pulse (!) 53   Ht 5' 3 (1.6 m)   Wt 159 lb (72.1 kg)   SpO2 96%   BMI 28.17 kg/m     Physical Exam Vitals and nursing note reviewed.  Constitutional:      Appearance: Normal appearance.  HENT:     Head: Normocephalic and atraumatic.  Eyes:     Conjunctiva/sclera: Conjunctivae normal.  Cardiovascular:     Rate and Rhythm: Normal rate and regular rhythm.  Pulmonary:  Effort: Pulmonary effort is normal.     Breath sounds: Normal breath sounds.  Skin:    General: Skin is warm and dry.  Neurological:     Mental Status: She is alert.  Psychiatric:        Mood and Affect: Mood normal.      No results found for any visits on 12/20/24.     The ASCVD Risk score (Arnett DK, et al., 2019)  failed to calculate for the following reasons:   The 2019 ASCVD risk score is only valid for ages 65 to 77   * - Cholesterol units were assumed    Assessment & Plan:   Problem List Items Addressed This Visit       Cardiovascular and Mediastinum   HYPERTENSION, BENIGN ESSENTIAL - Primary     Respiratory   COPD (chronic obstructive pulmonary disease) (HCC)     Genitourinary   CKD (chronic kidney disease) stage 3, GFR 30-59 ml/min (HCC)   Relevant Orders   Urine Microalbumin w/creat. ratio    Assessment and Plan Assessment & Plan Essential hypertension Blood pressure fluctuates between 140s and 200s, recently in 170s. Atenolol  discontinued due to severe side effects. Hydralazine  considered as alternative. Caffeine exacerbates hypertension. - Titrate hydralazine  to once daily, then twice daily, and finally three times daily as tolerated. - Avoid caffeine intake. Increasing her diuretic for 2 might help as well.   Atrial fibrillation and atrial flutter Recent atrial fibrillation with flutter, currently in normal sinus rhythm. Heart rate low, 40s to mid-50s. Atenolol  discontinued due to side effects. Hydralazine  not expected to lower heart rate significantly. - Monitor heart rate and blood pressure closely. - Report significant changes in heart rate or symptoms.  Chronic obstructive pulmonary disease Increased dyspnea possibly due to COPD flare. Recent inhaler change from Stiolto to Spiriva  may contribute. No significant cough or increased mucus production. Nasal injury may affect breathing. - Continue Spiriva  as prescribed. - Consider pulmonary rehabilitation if symptoms persist. - Monitor for signs of infection or exacerbation.  Stage 3 chronic kidney disease Serum creatinine 1.2, close to baseline. GFR 45.  Pulmonary vascular congestion Chest x-ray showed vascular congestion. Fluid on left lobe possibly contributing to dyspnea. - Increase Lasix  to twice daily for two days  to assess improvement in dyspnea. - Follow up with chest scan in February.    Return in about 2 weeks (around 01/03/2025) for nurse visit for bp check.    Dorothyann Byars, MD [facility]   "

## 2024-12-20 NOTE — Patient Instructions (Signed)
 Increase lasix  to twice a day for 2 days.

## 2024-12-28 ENCOUNTER — Ambulatory Visit (HOSPITAL_COMMUNITY)
Admission: RE | Admit: 2024-12-28 | Discharge: 2024-12-28 | Disposition: A | Discharge status: Home / Self Care | Source: Ambulatory Visit | Attending: Physician Assistant | Admitting: Physician Assistant

## 2024-12-28 VITALS — BP 156/64 | HR 60 | Ht 63.0 in | Wt 158.4 lb

## 2024-12-28 DIAGNOSIS — D6869 Other thrombophilia: Secondary | ICD-10-CM | POA: Diagnosis not present

## 2024-12-28 DIAGNOSIS — Z79899 Other long term (current) drug therapy: Secondary | ICD-10-CM | POA: Diagnosis not present

## 2024-12-28 DIAGNOSIS — I10 Essential (primary) hypertension: Secondary | ICD-10-CM

## 2024-12-28 DIAGNOSIS — Z5181 Encounter for therapeutic drug level monitoring: Secondary | ICD-10-CM | POA: Diagnosis not present

## 2024-12-28 DIAGNOSIS — I4891 Unspecified atrial fibrillation: Secondary | ICD-10-CM

## 2024-12-28 DIAGNOSIS — I4819 Other persistent atrial fibrillation: Secondary | ICD-10-CM | POA: Diagnosis not present

## 2024-12-28 NOTE — Progress Notes (Signed)
 "   Primary Care Physician: Alvan Dorothyann BIRCH, MD Primary Cardiologist: Redell Shallow, MD Electrophysiologist: Will Gladis Norton, MD  Referring Physician: Aline Door PA   Rebekah Harrell is a 84 y.o. female with a history of chronic diastolic CHF, mild to moderate MR, hypertension, hyperlipidemia, CKD stage III, COPD, atrial fibrillation who presents for follow up in the [facility]. Previously seen by Arland Don NP. Patient was initial diagnosed with atrial fibrillation in 05/2016. She was admitted in 11/2016 with atrial fibrillation with RVR and acute diastolic CHF. Seen in follow up 04/2023 and was back in afib, s/p DCCV on 06/10/23. Patient was back in afib by follow up 06/23/23. Previously maintained on sotalol . She was seen by Dr Norton and underwent afib and flutter ablation on 12/08/23. She was hospitalized from 12/10/2023 to 12/24/2023 at [facility] with hypoxic respiratory failure, bilateral infiltrates and pleural effusions. Her sotalol  at that time was stopped and she was requiring supplemental oxygen at discharge.   She presented to the ED 11/20/24 with presyncope and was found to be in afib with RVR. She was given metoprolol  to tartrate IV and had conversion back to NSR with sinus bradycardia in the 50s however then developed progressive sinus bradycardia into the 30s and there is reportedly a 3 second pause for which she was symptomatic. She was seen by EP for tachybradycardia syndrome. PPM deferred at that time and she was started on amiodarone .   She was seen at the ED again 12/07/24 with tachypalpitations. ECG showed SR at that time. A monitor was ordered which she is still wearing currently. Patient is on Eliquis  for stroke prevention.   Patient returns for follow up for atrial fibrillation and amiodarone  monitoring. She is in SR today. However, she does still report brief episodes of tachypalpitations. She also reports labile BP readings, mostly  elevated lately. No bleeding issues on anticoagulation.   Today, she  denies symptoms of chest pain, shortness of breath, orthopnea, PND, lower extremity edema, dizziness, presyncope, syncope, snoring, daytime somnolence, bleeding, or neurologic sequela. The patient is tolerating medications without difficulties and is otherwise without complaint today.    Atrial Fibrillation Risk Factors:  she does not have symptoms or diagnosis of sleep apnea. she does not have a history of rheumatic fever. she does not have a history of alcohol use.   Atrial Fibrillation Management history:  Previous antiarrhythmic drugs: sotalol , amiodarone   Previous cardioversions: 06/10/23 Previous ablations: none Anticoagulation history: Eliquis   ROS- All systems are reviewed and negative except as per the HPI above.   Physical Exam: BP (!) 156/64   Pulse 60   Ht 5' 3 (1.6 m)   Wt 71.8 kg   BMI 28.06 kg/m   GEN: Well nourished, well developed in no acute distress CARDIAC: Regular rate and rhythm, no murmurs, rubs, gallops RESPIRATORY:  Clear to auscultation without rales, wheezing or rhonchi  ABDOMEN: Soft, non-tender, non-distended EXTREMITIES:  No edema; No deformity    Wt Readings from Last 3 Encounters:  12/28/24 71.8 kg  12/20/24 72.1 kg  12/07/24 71.2 kg     EKG Interpretation Date/Time:  Wednesday December 28 2024 11:18:40 EST Ventricular Rate:  60 PR Interval:  102 QRS Duration:  128 QT Interval:  474 QTC Calculation: 474 R Axis:   93  Text Interpretation: Sinus rhythm with short PR Right bundle branch block Abnormal ECG When compared with ECG of 07-Dec-2024 12:13, No significant change was found Confirmed by Ximena Todaro ([id number]) on 12/28/2024  1:17:25 PM   Echo 06/09/23 demonstrated   1. Left ventricular ejection fraction, by estimation, is 50 to 55%. The  left ventricle has low normal function. The left ventricle has no regional  wall motion abnormalities. There is mild left  ventricular hypertrophy.  Left ventricular diastolic parameters are indeterminate.   2. Right ventricular systolic function is mildly reduced. The right  ventricular size is mildly enlarged. There is moderately elevated  pulmonary artery systolic pressure. The estimated right ventricular  systolic pressure is 46.9 mmHg.   3. Left atrial size was moderately dilated.   4. Right atrial size was moderately dilated.   5. The mitral valve is degenerative. Moderate mitral valve regurgitation.  Severe mitral annular calcification.   6. The aortic valve is tricuspid. Aortic valve regurgitation is not  visualized. Aortic valve sclerosis/calcification is present, without any  evidence of aortic stenosis.   7. The inferior vena cava is dilated in size with >50% respiratory  variability, suggesting right atrial pressure of 8 mmHg.    CHA2DS2-VASc Score = 6  The patient's score is based upon: CHF History: 1 HTN History: 1 Diabetes History: 0 Stroke History: 0 Vascular Disease History: 1 (calcifications noted on CT) Age Score: 2 Gender Score: 1       ASSESSMENT AND PLAN: Persistent Atrial Fibrillation/atrial flutter (ICD10:  I48.19) The patient's CHA2DS2-VASc score is 6, indicating a 9.7% annual risk of stroke.   S/p afib and flutter ablation 12/08/23. Now loaded on amiodarone  but still having tachypalpitations intermittently. We discussed rhythm control options and she is interested in repeat ablation. Will refer her back to Dr Inocencio to discuss.  Continue amiodarone  200 mg daily  Continue diltiazem  120 mg daily Continue Eliquis  5 mg BID  Secondary Hypercoagulable State (ICD10:  D68.69) The patient is at significant risk for stroke/thromboembolism based upon her CHA2DS2-VASc Score of 6.  Continue Apixaban  (Eliquis ). No bleeding issues.   High Risk Medication Monitoring (ICD 10: J342684) Patient requires ongoing monitoring for anti-arrhythmic medication which has the potential to cause  life threatening arrhythmias. Intervals on ECG acceptable for amiodarone  monitoring.      Valvular heart disease Moderate MR Repeat echo scheduled for 01/04/25  HTN Elevated today, labile at home but with frequent high readings.  She has been seen in the HTN clinic previously, will have her follow up there for medication adjustment.    Follow up with Dr Inocencio to discuss repeat ablation.    Daril Kicks PA-C [facility] [address] [place], [place] [address] [phone] "

## 2024-12-29 ENCOUNTER — Other Ambulatory Visit: Payer: Self-pay

## 2024-12-29 NOTE — Transitions of Care (Post Inpatient/ED Visit) (Signed)
 " Transition of Care week 5  Visit Note  12/29/2024  Name: Rebekah Harrell MRN: [id number]          DOB: July 11, 1941  Situation: Patient enrolled in [facility] 30-day program. Visit completed with Ludivina Bucco by telephone.   Background:   Initial Transition Care Management Follow-up Telephone Call Discharge Date and Diagnosis: No data recorded   Past Medical History:  Diagnosis Date   Allergy     Aortic atherosclerosis    Arthritis    lower back; knees (11/26/2016)   Asthma    Chronic atrial fibrillation (HCC)    CKD (chronic kidney disease), stage III (HCC)    patient unaware of this on 11/26/2016   Complication of anesthesia    had trouble waking me up in the late 1990's after gallbladder OR   COPD (chronic obstructive pulmonary disease) (HCC)    GERD (gastroesophageal reflux disease)    Heart murmur    dx'd when I was a little kid   History of hiatal hernia    Hyperlipidemia    Hypertension    Hypoglycemia     Assessment: Patient Reported Symptoms: Cognitive Cognitive Status: Alert and oriented to person, place, and time, Normal speech and language skills      Neurological Neurological Review of Symptoms: No symptoms reported    HEENT HEENT Symptoms Reported: No symptoms reported      Cardiovascular Cardiovascular Symptoms Reported: Palpitations, Irregular pulse, Fatigue Does patient have uncontrolled Hypertension?: Yes Is patient checking Blood Pressure at home?: Yes Patient's Recent BP reading at home: 156/64 Cardiovascular Management Strategies: Medication therapy, Routine screening, Coping strategies, Diet modification, Adequate rest Weight: 158 lb (71.7 kg) Cardiovascular Comment: The patient met with her PCP and Cardiologist. Her Hydralazine  is now three times a day. She is considering another Ablation for her irregular heart rate.  Respiratory Respiratory Symptoms Reported: Shortness of breath Additional Respiratory Details: SOB with exertion. The patient  states it takes her until about 1pm to feel better without so much SOB. She took an additional Furosemide  for 2 days to see if it would pull off more fluid and change her breathing but the patient reports no change Respiratory Management Strategies: Routine screening, Medication therapy, Adequate rest  Endocrine Endocrine Symptoms Reported: No symptoms reported Is patient diabetic?: No    Gastrointestinal Gastrointestinal Symptoms Reported: No symptoms reported      Genitourinary Genitourinary Symptoms Reported: No symptoms reported    Integumentary Integumentary Symptoms Reported: No symptoms reported    Musculoskeletal Musculoskelatal Symptoms Reviewed: Weakness Musculoskeletal Management Strategies: Routine screening, Adequate rest      Psychosocial Psychosocial Symptoms Reported: No symptoms reported         Today's Vitals   12/29/24 1350  Weight: 158 lb (71.7 kg)      Medications Reviewed Today     Reviewed by Moises Reusing, RN (Case Manager) on 12/29/24 at 1318  Med List Status: <None>   Medication Order Taking? Sig Documenting Provider Last Dose Status Informant  amiodarone  (PACERONE ) 200 MG tablet [id number]  Take 1 tablet (200 mg total) by mouth daily. Ursuy, Renee Lynn, PA-C  Active   Ascorbic Acid (VITAMIN C PO) [id number]  Take 600 mg by mouth every morning. [provider]  Active   CALCIUM  PO [id number]  Take 1 tablet by mouth daily. [provider]  Active Self, Pharmacy Records  Cholecalciferol (VITAMIN D-3 PO) [id number]  Take 1 capsule by mouth daily. [provider]  Active Self, Pharmacy Records  dapagliflozin  propanediol (  FARXIGA ) 10 MG TABS tablet [id number]  TAKE 1 TABLET EVERY DAY BEFORE BREAKFAST Goodrich, Callie E, PA-C  Active   diltiazem  (CARDIZEM  CD) 120 MG 24 hr capsule [id number]  Take 1 capsule (120 mg total) by mouth daily. Inocencio Soyla Lunger, MD  Active   ELIQUIS  5 MG TABS tablet [id number]  TAKE 1 TABLET TWICE DAILY  Pietro Redell RAMAN, MD  Active Self, Pharmacy Records  famotidine  (PEPCID ) 20 MG tablet [id number]  TAKE 1 TABLET AT BEDTIME Alvan Dorothyann BIRCH, MD  Active Self, Pharmacy Records  furosemide  (LASIX ) 20 MG tablet [id number]  Take 1 tablet (20 mg total) by mouth daily.  Patient taking differently: Take 40 mg by mouth daily.   Goodrich, Callie E, PA-C  Active Self, Pharmacy Records  hydrALAZINE  (APRESOLINE ) 25 MG tablet [id number]  Take 1 tablet (25 mg total) by mouth 3 (three) times daily. Barbaraann Darryle Ned, MD  Active   MAGNESIUM  PO [id number]  Take 1 tablet by mouth daily. [provider]  Active Self, Pharmacy Records  montelukast  (SINGULAIR ) 10 MG tablet [id number]  Take 1 tablet (10 mg total) by mouth at bedtime. Alvan Dorothyann BIRCH, MD  Active Self, Pharmacy Records  Multiple Vitamins-Minerals (MULTIVITAMIN WOMEN 50+) TABS [id number]  Take 1 tablet by mouth daily. [provider]  Active Self, Pharmacy Records  Multiple Vitamins-Minerals (ZINC PO) [id number]  Take 1 tablet by mouth daily. [provider]  Active Self, Pharmacy Records  omeprazole  (PRILOSEC) 20 MG capsule [id number]  TAKE 1 CAPSULE EVERY DAY Alvan Dorothyann BIRCH, MD  Active Self, Pharmacy Records  potassium chloride  SA (KLOR-CON  M) 20 MEQ tablet [id number]  TAKE 1 TABLET EVERY DAY Alvan Dorothyann BIRCH, MD  Active Self, Pharmacy Records  rosuvastatin  (CRESTOR ) 20 MG tablet [id number]  TAKE 1 TABLET EVERY DAY Pietro Redell RAMAN, MD  Active Self, Pharmacy Records  Tiotropium Bromide (SPIRIVA  RESPIMAT) 1.25 MCG/ACT AERS [id number]  INHALE 2 PUFFS EVERY DAY Hunsucker, Donnice SAUNDERS, MD  Active   traMADol  (ULTRAM ) 50 MG tablet [id number]  Take 1 tablet (50 mg total) by mouth every 12 (twelve) hours as needed for moderate pain (pain score 4-6). Alvan Dorothyann BIRCH, MD  Active Self, Pharmacy Records            Recommendation:   Continue Current Plan of Care  Follow Up Plan:   Telephone follow-up in 1  week  Medford Balboa, BSN, RN [place]  VBCI - [facility] RN Care Manager [phone]     "

## 2024-12-29 NOTE — Patient Instructions (Signed)
 Visit Information  Thank you for taking time to visit with me today. Please don't hesitate to contact me if I can be of assistance to you before our next scheduled telephone appointment.  Our next appointment is by telephone on January 15th at 1:00pm   Following is a copy of your care plan:   Goals Addressed             This Visit's Progress    VBCI Transitions of Care (TOC) Care Plan       Problems: (reviewed 12/29/24) Recent Hospitalization for treatment of Atrial Fibrillation Hospital or Ed Admit Risk of 93%  Goal: (reviewed 12/29/24) Over the next 30 days, the patient will not experience hospital readmission (reviewed 12/29/24) Interventions:   AFIB Interventions:   Counseled on increased risk of stroke due to Afib and benefits of anticoagulation for stroke prevention Reviewed importance of adherence to anticoagulant exactly as prescribed Counseled on bleeding risk associated with Eliquis  and importance of self-monitoring for signs/symptoms of bleeding Counseled on avoidance of NSAIDs due to increased bleeding risk with anticoagulants Counseled on importance of regular laboratory monitoring as prescribed Screening for signs and symptoms of depression related to chronic disease state  Fall Precautions Avoid sharp objects like razors, broken glass. Cautions with use of knives and scissors 11/29/24 - Restart Diltiazem  120mg  daily Take blood pressure twice daily 12/18 - The patient went to the ED on 12/07/24 for symptoms related to high BP and A-fib. The patient was started on Pindolol  5mg  daily but her insurance doesn't pay for it and she is looking for an alternative Amiodarone  200mg  daily  12/29 - The patient was started on Atenolol  but she had side effects and it was stopped and she has been started on Hydralazine  25 mg three times a day but hasn't started it yet. She sees the PCP on 12/30 12/29/24 - The patient has been started Hydralazine  three times a day. She also took an extra  Furosemide  for 2 days to see if her BP would come down and lose some fluid but it has not.   Patient Self Care Activities: (reviewed 12/19/24) Attend all scheduled provider appointments Attend church or other social activities Call provider office for new concerns or questions  Notify RN Care Manager of [facility] call rescheduling needs Participate in Transition of Care Program/Attend [facility] scheduled calls Perform all self care activities independently  Take medications as prescribed    Plan:  Telephone follow up appointment with care management team member scheduled for:   Thursday January 15th at 1:00pm        Patient verbalizes understanding of instructions and care plan provided today and agrees to view in MyChart. Active MyChart status and patient understanding of how to access instructions and care plan via MyChart confirmed with patient.     The patient has been provided with contact information for the care management team and has been advised to call with any health related questions or concerns.   Please call the care guide team at [phone] if you need to cancel or reschedule your appointment.   Please call the Suicide and Crisis Lifeline: 988 call the [place]  National Suicide Prevention Lifeline: (413)523-5660 or TTY: (952) 501-3105 TTY (250)187-9731) to talk to a trained counselor if you are experiencing a Mental Health or Behavioral Health Crisis or need someone to talk to.  Medford Balboa, BSN, RN [place]  VBCI - [employer] [facility] RN Care Manager [phone]

## 2024-12-30 ENCOUNTER — Ambulatory Visit: Admitting: Pharmacist Clinician (PhC)/ Clinical Pharmacy Specialist

## 2024-12-30 NOTE — Progress Notes (Unsigned)
 "  Office Visit    Patient Name: Rebekah Harrell Date of Encounter: 12/30/2024  Primary Care Provider:  Alvan Dorothyann BIRCH, MD Primary Cardiologist:  Redell Shallow, MD  Chief Complaint    Hypertension  Significant Past Medical History   AF Persistent, s/p ablation 12/24, CHADS2-VASc = 5, on diltiazem  120 daily  CHF Chronic diastolic (grade 2) dysfunction  HLD 7/25 LDL 56 on rosuvastatin  20  CKD Stage 3, GFR 47 (7/25)    Allergies  Allergen Reactions   Ace Inhibitors Swelling   Arnuity Ellipta  [Fluticasone  Furoate] Other (See Comments)    Vocal hoarseness   Atenolol  Other (See Comments)    SOB, dizziness   Lipitor [Atorvastatin] Other (See Comments)    Myalgias   Tape Itching, Dermatitis and Other (See Comments)    Localized redness at removal site  Reaction occurred with the removal of paper tape   Yellow Dyes 6, 10, And 11 Other (See Comments)    Allergy  test   Zocor  Andre.ball ] Other (See Comments)    Myalgias   Zyrtec [Cetirizine] Other (See Comments)    Fatigue    Celebrex [Celecoxib] Other (See Comments)    Hair loss   Penicillins Other (See Comments)    Unknown childhood reaction    Sulfonamide Derivatives Rash    History of Present Illness    Rebekah Harrell is a 84 y.o. female patient of Dr Shallow, in the office today for hypertension evaluation.   Rebekah Harrell was seen in June by Callie Goodrich, with a BP of 132/78.  Because of fatigue related to metoprolol , she was switched to diltiazem  (for AF).  In July she presented to the ED with a BP of 184/86.  No medications were added, and she was seen in follow up by Dr. Court (DOD) within a week.  Pressure still elevated at 158/72.  I saw her in September and home readings had normalized.  She was most recently seen on Wednesday this week by the AF clinic, with a BP of 156/64   Blood Pressure Goal:  130/80  Current Medications:  diltiazem  120 mg daily, hydralazine  25 mg tid (furosemide  40 mg  daily)  Previously tried:  metoprolol  - fatigue   ACEI - swelling   Atenolol  - SOB, dizziness  PATIENT HAS YELLOW DYE ALLERGY   Family Hx:  mother with MI, brother with AF  Social Hx:      Tobacco: no  Alcohol: no   Diet:   feta cheese omelet or yogurt for breakfast; lots of lunch meat sandwiches , grilled cheese, pb&j; rotissire chicken [employer] choice dinners; protein shake (w/ milk and ice cream)  Exercise: none  Home BP readings:   has 28 readings over past 3 weeks, only 6 have systolic > 130, highest 141.  Diastolic all WNL.  (Range 103-141/52-67)   Accessory Clinical Findings    Lab Results  Component Value Date   CREATININE 1.20 (H) 12/07/2024   BUN 13 12/07/2024   NA 138 12/07/2024   K 3.8 12/07/2024   CL 104 12/07/2024   CO2 20 (L) 12/07/2024   Lab Results  Component Value Date   ALT 8 07/01/2024   AST 21 07/01/2024   ALKPHOS 99 07/01/2024   BILITOT 0.6 07/01/2024   Lab Results  Component Value Date   HGBA1C 5.8 (H) 06/19/2016    Home Medications    Current Outpatient Medications  Medication Sig Dispense Refill   amiodarone  (PACERONE ) 200 MG tablet Take 1 tablet (200 mg  total) by mouth daily. 30 tablet 5   Ascorbic Acid (VITAMIN C PO) Take 600 mg by mouth every morning.     CALCIUM  PO Take 1 tablet by mouth daily.     Cholecalciferol (VITAMIN D-3 PO) Take 1 capsule by mouth daily.     dapagliflozin  propanediol (FARXIGA ) 10 MG TABS tablet TAKE 1 TABLET EVERY DAY BEFORE BREAKFAST 90 tablet 0   diltiazem  (CARDIZEM  CD) 120 MG 24 hr capsule Take 1 capsule (120 mg total) by mouth daily. 90 capsule 1   ELIQUIS  5 MG TABS tablet TAKE 1 TABLET TWICE DAILY 180 tablet 3   famotidine  (PEPCID ) 20 MG tablet TAKE 1 TABLET AT BEDTIME 90 tablet 3   furosemide  (LASIX ) 20 MG tablet Take 1 tablet (20 mg total) by mouth daily. (Patient taking differently: Take 40 mg by mouth daily.)     hydrALAZINE  (APRESOLINE ) 25 MG tablet Take 1 tablet (25 mg total) by mouth 3 (three) times  daily. 90 tablet 1   MAGNESIUM  PO Take 1 tablet by mouth daily.     montelukast  (SINGULAIR ) 10 MG tablet Take 1 tablet (10 mg total) by mouth at bedtime. 90 tablet 3   Multiple Vitamins-Minerals (MULTIVITAMIN WOMEN 50+) TABS Take 1 tablet by mouth daily.     Multiple Vitamins-Minerals (ZINC PO) Take 1 tablet by mouth daily.     omeprazole  (PRILOSEC) 20 MG capsule TAKE 1 CAPSULE EVERY DAY 90 capsule 3   potassium chloride  SA (KLOR-CON  M) 20 MEQ tablet TAKE 1 TABLET EVERY DAY 90 tablet 3   rosuvastatin  (CRESTOR ) 20 MG tablet TAKE 1 TABLET EVERY DAY 90 tablet 3   Tiotropium Bromide (SPIRIVA  RESPIMAT) 1.25 MCG/ACT AERS INHALE 2 PUFFS EVERY DAY 12 g 4   traMADol  (ULTRAM ) 50 MG tablet Take 1 tablet (50 mg total) by mouth every 12 (twelve) hours as needed for moderate pain (pain score 4-6). 60 tablet 2   No current facility-administered medications for this visit.    No BP recorded.  {Refresh Note OR Click here to enter BP  :1}***  Assessment & Plan    No problem-specific Assessment & Plan notes found for this encounter.   Allean Mink PharmD CPP [facility]  [address] [place], [place] [address] [phone]   "

## 2025-01-03 ENCOUNTER — Ambulatory Visit

## 2025-01-03 VITALS — BP 126/52 | HR 50 | Resp 18 | Ht 63.0 in | Wt 159.0 lb

## 2025-01-03 DIAGNOSIS — I10 Essential (primary) hypertension: Secondary | ICD-10-CM | POA: Diagnosis not present

## 2025-01-03 DIAGNOSIS — Z23 Encounter for immunization: Secondary | ICD-10-CM | POA: Diagnosis not present

## 2025-01-03 NOTE — Progress Notes (Signed)
" ° °  Subjective:    Patient ID: Rebekah Harrell, female    DOB: 12-19-1941, 84 y.o.   MRN: [id number]  HPI  Patient is here for BP recheck. Patiently currently takes pacerone  200 mg, dapagliflozin  10 mg, hydrazine 25 mg. Denies dizziness, SOB, CP, heart palpitations, or vision changes. Patient also inquiring if Dr. Alvan recommend she have the Prevnar 20 vaccine due to bilateral pneumonia last yr. Dr. Alvan does recommend it.  Review of Systems     Objective:   Physical Exam        Assessment & Plan:   Patients first BP reading is 142/51. Her second one is 126/52 reported to Dr. Alvan who would like to review patients chart and wait for her ECHO results in which she has scheduled for tomorrow before any changes if any are made. Patient given her Prevnar 20 in her RD. Tolerated well no redness or swelling noted at the injection site. Patient is aware "

## 2025-01-04 ENCOUNTER — Ambulatory Visit (HOSPITAL_BASED_OUTPATIENT_CLINIC_OR_DEPARTMENT_OTHER)
Admission: RE | Admit: 2025-01-04 | Discharge: 2025-01-04 | Disposition: A | Discharge status: Home / Self Care | Source: Ambulatory Visit | Attending: Cardiology | Admitting: Cardiology

## 2025-01-04 DIAGNOSIS — I340 Nonrheumatic mitral (valve) insufficiency: Secondary | ICD-10-CM | POA: Insufficient documentation

## 2025-01-04 LAB — ECHOCARDIOGRAM COMPLETE
AR max vel: 1.69 cm2
AV Area VTI: 1.91 cm2
AV Area mean vel: 1.75 cm2
AV Mean grad: 7 mmHg
AV Peak grad: 13.1 mmHg
Ao pk vel: 1.81 m/s
Area-P 1/2: 3.5 cm2
Calc EF: 76.9 %
MV M vel: 4.67 m/s
MV Peak grad: 87.2 mmHg
Radius: 0.5 cm
S' Lateral: 2 cm
Single Plane A2C EF: 75.6 %
Single Plane A4C EF: 77.6 %

## 2025-01-04 LAB — MICROALBUMIN / CREATININE URINE RATIO
Creatinine, Urine: 114.1 mg/dL
Microalb/Creat Ratio: 9 mg/g{creat} (ref 0–29)
Microalbumin, Urine: 9.7 ug/mL

## 2025-01-05 ENCOUNTER — Other Ambulatory Visit: Payer: Self-pay | Admitting: Family Medicine

## 2025-01-05 ENCOUNTER — Other Ambulatory Visit: Payer: Self-pay

## 2025-01-05 ENCOUNTER — Other Ambulatory Visit: Payer: Self-pay | Admitting: Student

## 2025-01-05 ENCOUNTER — Ambulatory Visit: Payer: Self-pay | Admitting: Family Medicine

## 2025-01-05 DIAGNOSIS — K2100 Gastro-esophageal reflux disease with esophagitis, without bleeding: Secondary | ICD-10-CM

## 2025-01-05 DIAGNOSIS — R0609 Other forms of dyspnea: Secondary | ICD-10-CM

## 2025-01-05 NOTE — Progress Notes (Signed)
 Your lab work is within acceptable range and there are no concerning findings.   ?

## 2025-01-05 NOTE — Patient Instructions (Signed)
 Visit Information  Thank you for taking time to visit with me today. Please don't hesitate to contact me if I can be of assistance to you before our next scheduled telephone appointment.    Following is a copy of your care plan:   Goals Addressed             This Visit's Progress    COMPLETED: VBCI Transitions of Care (TOC) Care Plan       Problems: (reviewed 01/05/25) Recent Hospitalization for treatment of Atrial Fibrillation Hospital or Ed Admit Risk of 93%  Goal: (reviewed 01/05/25) Over the next 30 days, the patient will not experience hospital readmission  Interventions: (reviewed 01/05/25)  AFIB Interventions:   Counseled on increased risk of stroke due to Afib and benefits of anticoagulation for stroke prevention Reviewed importance of adherence to anticoagulant exactly as prescribed Counseled on bleeding risk associated with Eliquis  and importance of self-monitoring for signs/symptoms of bleeding Counseled on avoidance of NSAIDs due to increased bleeding risk with anticoagulants Counseled on importance of regular laboratory monitoring as prescribed Screening for signs and symptoms of depression related to chronic disease state  Fall Precautions Avoid sharp objects like razors, broken glass. Cautions with use of knives and scissors 11/29/24 - Restart Diltiazem  120mg  daily Take blood pressure twice daily 12/18 - The patient went to the ED on 12/07/24 for symptoms related to high BP and A-fib. The patient was started on Pindolol  5mg  daily but her insurance doesn't pay for it and she is looking for an alternative Amiodarone  200mg  daily  12/29 - The patient was started on Atenolol  but she had side effects and it was stopped and she has been started on Hydralazine  25 mg three times a day but hasn't started it yet. She sees the PCP on 12/30 12/29/24 - The patient has been started Hydralazine  three times a day. She also took an extra Furosemide  for 2 days to see if her BP would come  down and lose some fluid but it has not.   Patient Self Care Activities: (reviewed 12/29/24) Attend all scheduled provider appointments Attend church or other social activities Call provider office for new concerns or questions  Notify RN Care Manager of TOC call rescheduling needs Participate in Transition of Care Program/Attend TOC scheduled calls Perform all self care activities independently  Take medications as prescribed    Plan:  Telephone follow up appointment with care management team member scheduled for:  The patient has completed her goals. She has been discharged from the [facility]        Patient verbalizes understanding of instructions and care plan provided today and agrees to view in MyChart. Active MyChart status and patient understanding of how to access instructions and care plan via MyChart confirmed with patient.     The patient has been provided with contact information for the care management team and has been advised to call with any health related questions or concerns.   Please call the care guide team at [phone] if you need to cancel or reschedule your appointment.   Please call the Suicide and Crisis Lifeline: 988 call the [place]  National Suicide Prevention Lifeline: 424 007 4444 or TTY: (867)078-9005 TTY 4504168490) to talk to a trained counselor if you are experiencing a Mental Health or Behavioral Health Crisis or need someone to talk to.  Medford Balboa, BSN, RN [place]  VBCI - [employer] [facility] RN Care Manager [phone]

## 2025-01-05 NOTE — Transitions of Care (Post Inpatient/ED Visit) (Signed)
 " Transition of Care Week 6  Visit Note  01/05/2025  Name: Rebekah Harrell MRN: [id number]          DOB: [birth year]  Situation: Patient enrolled in [facility] 30-day program. Visit completed with Ludivina Bucco by telephone.   Background:   Initial Transition Care Management Follow-up Telephone Call Discharge Date and Diagnosis: No data recorded   Past Medical History:  Diagnosis Date   Allergy     Aortic atherosclerosis    Arthritis    lower back; knees (11/26/2016)   Asthma    Chronic atrial fibrillation (HCC)    CKD (chronic kidney disease), stage III (HCC)    patient unaware of this on 11/26/2016   Complication of anesthesia    had trouble waking me up in the late 1990's after gallbladder OR   COPD (chronic obstructive pulmonary disease) (HCC)    GERD (gastroesophageal reflux disease)    Heart murmur    dx'd when I was a little kid   History of hiatal hernia    Hyperlipidemia    Hypertension    Hypoglycemia     Assessment: Patient Reported Symptoms: Cognitive Cognitive Status: Alert and oriented to person, place, and time, Normal speech and language skills      Neurological Neurological Review of Symptoms: No symptoms reported    HEENT HEENT Symptoms Reported: No symptoms reported      Cardiovascular Cardiovascular Symptoms Reported: Palpitations, Lightheadness, Dizziness Other Cardiovascular Symptoms: Sits on the EOB before statnding Does patient have uncontrolled Hypertension?: Yes Is patient checking Blood Pressure at home?: Yes Patient's Recent BP reading at home: 142/51 and 126/52 Cardiovascular Management Strategies: Medication therapy, Routine screening, Coping strategies, Adequate rest, Activity Weight: 159 lb (72.1 kg) Cardiovascular Comment: The patient has had an ECHO done and is waiting for the results.No Cahnges in medications  Respiratory Additional Respiratory Details: SOB with exertion. Respiratory Management Strategies: Routine screening,  Medication therapy, Adequate rest  Endocrine Endocrine Symptoms Reported: No symptoms reported Is patient diabetic?: No    Gastrointestinal Gastrointestinal Symptoms Reported: No symptoms reported      Genitourinary Genitourinary Symptoms Reported: No symptoms reported    Integumentary Integumentary Symptoms Reported: No symptoms reported    Musculoskeletal Musculoskelatal Symptoms Reviewed: No symptoms reported        Psychosocial Psychosocial Symptoms Reported: No symptoms reported         Today's Vitals   01/05/25 1320  Weight: 159 lb (72.1 kg)      Medications Reviewed Today     Reviewed by Moises Reusing, RN (Case Manager) on 01/05/25 at 1305  Med List Status: <None>   Medication Order Taking? Sig Documenting Provider Last Dose Status Informant  amiodarone  (PACERONE ) 200 MG tablet [id number]  Take 1 tablet (200 mg total) by mouth daily. Ursuy, Renee Lynn, PA-C  Active   Ascorbic Acid (VITAMIN C PO) [id number]  Take 600 mg by mouth every morning. [provider]  Active   CALCIUM  PO [id number]  Take 1 tablet by mouth daily. [provider]  Active Self, Pharmacy Records  Cholecalciferol (VITAMIN D-3 PO) [id number]  Take 1 capsule by mouth daily. [provider]  Active Self, Pharmacy Records  dapagliflozin  propanediol (FARXIGA ) 10 MG TABS tablet [id number]  TAKE 1 TABLET EVERY DAY BEFORE BREAKFAST Jadine, Callie E, PA-C  Active   diltiazem  (CARDIZEM  CD) 120 MG 24 hr capsule [id number]  Take 1 capsule (120 mg total) by mouth daily. Inocencio Soyla Lunger, MD  Active   ELIQUIS  5 MG TABS  tablet [id number]  TAKE 1 TABLET TWICE DAILY Pietro Redell RAMAN, MD  Active Self, Pharmacy Records  famotidine  (PEPCID ) 20 MG tablet [id number]  TAKE 1 TABLET AT BEDTIME Alvan Dorothyann BIRCH, MD  Active Self, Pharmacy Records  furosemide  (LASIX ) 20 MG tablet [id number]  Take 1 tablet (20 mg total) by mouth daily.  Patient taking differently: Take 40 mg by mouth daily.    Goodrich, Callie E, PA-C  Active Self, Pharmacy Records  hydrALAZINE  (APRESOLINE ) 25 MG tablet [id number]  Take 1 tablet (25 mg total) by mouth 3 (three) times daily. Barbaraann Darryle Ned, MD  Active   MAGNESIUM  PO [id number]  Take 1 tablet by mouth daily. [provider]  Active Self, Pharmacy Records  montelukast  (SINGULAIR ) 10 MG tablet [id number]  Take 1 tablet (10 mg total) by mouth at bedtime. Alvan Dorothyann BIRCH, MD  Active Self, Pharmacy Records  Multiple Vitamins-Minerals (MULTIVITAMIN WOMEN 50+) TABS [id number]  Take 1 tablet by mouth daily. [provider]  Active Self, Pharmacy Records  Multiple Vitamins-Minerals (ZINC PO) [id number]  Take 1 tablet by mouth daily. [provider]  Active Self, Pharmacy Records  omeprazole  [facility]) 20 MG capsule [id number]  TAKE 1 CAPSULE EVERY DAY Alvan Dorothyann BIRCH, MD  Active Self, Pharmacy Records  potassium chloride  SA (KLOR-CON  M) 20 MEQ tablet [id number]  TAKE 1 TABLET EVERY DAY Alvan Dorothyann BIRCH, MD  Active Self, Pharmacy Records  rosuvastatin  (CRESTOR ) 20 MG tablet [id number]  TAKE 1 TABLET EVERY DAY Pietro Redell RAMAN, MD  Active Self, Pharmacy Records  Tiotropium Bromide (SPIRIVA  RESPIMAT) 1.25 MCG/ACT AERS [id number]  INHALE 2 PUFFS EVERY DAY Hunsucker, Donnice SAUNDERS, MD  Active   traMADol  (ULTRAM ) 50 MG tablet [id number]  Take 1 tablet (50 mg total) by mouth every 12 (twelve) hours as needed for moderate pain (pain score 4-6). Alvan Dorothyann BIRCH, MD  Active Self, Pharmacy Records            Recommendation:   Goals have been met. The patient is discharged from the [facility] Program  Follow Up Plan:   Closing From:  Transitions of Care Program  [facility], BSN, RN [place]  VBCI - [facility] RN Care Manager [phone]     "

## 2025-01-06 ENCOUNTER — Ambulatory Visit: Payer: Self-pay | Admitting: Cardiology

## 2025-01-06 NOTE — Telephone Encounter (Signed)
 Last Creatine Lab outside of Normal  In accordance with refill protocols, please review and address the following requirements before this medication refill can be authorized:  Labs

## 2025-01-07 NOTE — Progress Notes (Unsigned)
 "  Cardiology Office Note:    Date:  01/07/2025   ID:  MONTE BRONDER, DOB [birth year], MRN [id number]  PCP:  Alvan Dorothyann BIRCH, MD  Cardiologist:  Redell Shallow, MD Electrophysiologist:  Soyla Gladis Norton, MD { Click to update primary MD,subspecialty MD or APP then REFRESH:1}    Referring MD: Alvan Dorothyann BIRCH, MD   Chief Complaint: follow-up of atrial fibrillation  History of Present Illness:    Rebekah Harrell is a 84 y.o. female with a history of atypical chest pain with negative Myoview in 11/2023, persistent atrial fibrillation s/p ablation in 11/2023 on Eliquis , bifascicular block with sick sinus syndrome, chronic diastolic CHF, mild to moderate MR, hypertension, hyperlipidemia, CKD stage III, COPD, and GERD who is followed by Dr. Shallow and presents today for follow-up of atrial fibrillation.     Persistent Atrial Fibrillation - Initially diagnosed in 05/2016. - Admitted in 11/2016 with atrial fibrillation with RVR and acute diastolic CHF. Echo showed normal LV function. She was started on Sotalol  and converted to sinus rhythm.  - Subsequently discontinued Eliquis  on her own due to concerns about risk of bleeding and cost.  - Noted to back in atrial fibrillation at office visit in 04/2023. She was restarted on Eliquis  and underwent successful DCCV in 05/2023 with restoration of sinus rhythm. But had quick return of atrial fibrillation within a couple of days. She ultimately underwent atrial fibrillation ablation in 11/2023 with Dr. Norton.  - Sotalol  was stopped during admission at [facility] in 11/2023 due to prolonged QTc and she was started on Metoprolol  instead.  - Metoprolol  stopped at visit in 05/2024 due to reports of significant fatigue and she was started on Diltiazem  instead.   Chronic Diastolic CHF - EF has ranged from 50-65% since 2012.  - Admitted in 11/2016 with acute diastolic CHF in setting of atrial fibrillation with RVR.  - Admitted from 12/10/2023 to  12/24/2023 at [facility] for acute hypoxic respiratory failure secondary to bilateral pneumonia, COPD exacerbation, and acute diastolic CHF. Echo showed LVEF of 67-70% with normal wall motion and moderate diastolic dysfunction as well as mild AS/ AI and mild MR. Myoview was negative for ischemia.    Valvular Disease - Echo in 03/2020 showed mild to moderate MR.  - Echo in 05/2023 showed moderate MR.  - Echo in 11/2023 at [facility] showed mild MR and mild AS/ AI.     Patient is primarily followed by Cardiology for atrial fibrillation and diastolic CHF. She has had problems with recurrent atrial fibrillation and CHF over the last 1.5 years as detailed above in the narrative history. She was recently admitted from 11/20/2024 to 11/23/2024 for recurrent atrial fibrillation with RVR complicated by post-conversion pauses as well as recurrent transient sinus bradycardia with rates as low as the 30s. She had some sinus pauses as well. Repeat Echo showed LVEF of 60-65% with mild LVH, grade 2 diastolic dysfunction, and mild to moderate MR. She was seen by EP and loaded with Amiodarone  and plan was to consider re-do ablation as an outpatient. Diltiazem  was stopped but was subsequently restarted after discharge due to recurrent atrial fibrillation.   She was seen in the ED again on 12/07/2024 for intermittent palpitations. However, she was in sinus rhythm while in the ED with rates in the 50s to 60s. She was seen by Cardiology in the ED and started on Pindolol . Her insurance did not cover this so she was switched to Atenolol  instead but she was unable to tolerate  this. Repeat outpatient monitor was order as well as a repeat Echo for further evaluation of prominent murmur. Echo showed  LVEF  of 60-65% with mild LVH, normal RV function, severe left atrial enlargement, moderate to severe mitral regurgitation (likely underestimated with splay artifact), and moderately elevated PASP of 59.9 mmHg.   She was last seen in the A.Fib  Clinic on 12/28/2024 at which time she reported brief episodes of tachy-palpitations but was in sinus rhythm at time of visit. She also reported labile BP readings. She was referred back to Dr. Inocencio for consideratio of redo ablation.  Patient presents today for follow-up. ***  Persistent Atrial Fibrillation Sick Sinus Syndrome Patient was initially diagnosed with atrial fibrillation in 2017 and had done well on Sotalol  for a while until 04/2023 when she developed recurrent atrial fibrillation. She ultimately underwent an ablation in 11/2023 and Sotalol  was subsequently stopped due to prolonged QTc. She was recently admitted in 11/2024 for recurrent atrial fibrillation complicated by post-conversion pauses as well as recurrent transient sinus bradycardia with rates as low as the 30s. She had some sinus pauses as well. She was started on Amiodarone . Monitor in *** - Maintaining sinus rhythm on exam. *** - Continue Diltiazem  120mg  daily.  - Continue Amiodarone  200mg  daily.  - Continue chronic anticoagulation with Eliquis  5mg  twice daily.  - Patient is followed in the A.Fib Clinic and has been referred back to Dr. Pietro for consideration of redo ablation.  Chronic HFpEF Echo on 01/04/2025 showed LVEF of 60-65% with mild LVH as well as normal RV function with moderately elevated PASP of 59.9 mmHg.  - Euvolemic on exam. *** - Continue Lasix  40mg  daily. *** - Continue Farxiga  10mg  daily.   Moderate to Severe Mitral Regurgitation Most recent Echo in 01/04/2025 showed moderate to severe mitral regurgitation (likely underestimated with splay artifact). - ***  Hypertension BP *** - Current medications: Diltiazem  120mg  daily and Hydralazine  25mg  three times daily.   Hyperlipidemia Lipid panel in 06/2024: Total Cholesterol 125, Triglycerides 124, HDL 47, LDL 56.  - Continue Crestor  20mg  daily   CKD Stage III Baseline creatinine around 1.1 to 1.3.    EKGs/Labs/Other Studies Reviewed:    The  following studies were reviewed:  Myoview 12/22/2023 ([facility]): Impression: Ejection fraction calculates to 51%. No ischemia  _______________  Carotid Ultrasound 07/11/2024: Summary:  - Right Carotid: Velocities in the right ICA are consistent with a 1-39%  stenosis. The extracranial vessels were near-normal with only minimal  wall thickening or plaque. Very mild mixed plaque noted in the  prox ICA.  - Left Carotid: Velocities in the left ICA are consistent with a 1-39%  stenosis. Very mild mixed plaque noted int he prox ICA.  - Vertebrals:  Bilateral vertebral arteries demonstrate antegrade flow.  - Subclavians: Normal flow hemodynamics were seen in bilateral subclavian  arteries.  _______________  Echocardiogram 01/04/2025: Impressions: 1. The mitral valve is degenerative with moderate annular and posterior  leaflet calcification. Moderate to severe mitral valve regurgitation,  likely underestimated with 'splay' artifact. No evidence of mitral  stenosis.   2. Left ventricular ejection fraction, by estimation, is 60 to 65%. The  left ventricle has normal function. The left ventricle has no regional  wall motion abnormalities. There is mild left ventricular hypertrophy.  Left ventricular diastolic parameters  are indeterminate. The average left ventricular global longitudinal strain  is -22.3 %.   3. Right ventricular systolic function is normal. The right ventricular  size is normal. There is moderately  elevated pulmonary artery systolic  pressure. The estimated right ventricular systolic pressure is 59.9 mmHg.   4. Left atrial size was severely dilated.   5. Right atrial size was mildly dilated.   6. The aortic valve is tricuspid. There is mild calcification of the  aortic valve. There is moderate thickening of the aortic valve. Aortic  valve regurgitation is not visualized. No aortic stenosis is present.   7. The inferior vena cava is normal in size with greater than 50%   respiratory variability, suggesting right atrial pressure of 3 mmHg.   8. Rhythm strip during this exam demonstrates sinus bradycardia.  _______________  Monitor ***   EKG:  EKG not ordered today.  Recent Labs: 01/21/2024: BNP 191.8 07/01/2024: ALT 8 11/20/2024: TSH 2.431 12/07/2024: BUN 13; Creatinine, Ser 1.20; Hemoglobin 13.0; Magnesium  2.3; Platelets 244; Potassium 3.8; Pro Brain Natriuretic Peptide 1,376.0; Sodium 138  Recent Lipid Panel    Component Value Date/Time   CHOL 125 06/29/2024 1131   TRIG 124 06/29/2024 1131   HDL 47 06/29/2024 1131   CHOLHDL 2.7 06/29/2024 1131   CHOLHDL 3.3 08/27/2022 1218   VLDL 26 06/19/2016 1136   LDLCALC 56 06/29/2024 1131   LDLCALC 76 08/27/2022 1218    Physical Exam:    Vital Signs: There were no vitals taken for this visit.    Wt Readings from Last 3 Encounters:  01/05/25 159 lb (72.1 kg)  01/03/25 159 lb (72.1 kg)  12/29/24 158 lb (71.7 kg)     General: 84 y.o. female in no acute distress. HEENT: Normocephalic and atraumatic. Sclera clear.  Neck: Supple. No carotid bruits. No JVD. Heart: *** RRR. Distinct S1 and S2. No murmurs, gallops, or rubs.  Lungs: No increased work of breathing. Clear to ausculation bilaterally. No wheezes, rhonchi, or rales.  Abdomen: Soft, non-distended, and non-tender to palpation.  Extremities: No lower extremity edema.  Radial and distal pedal pulses 2+ and equal bilaterally. Skin: Warm and dry. Neuro: No focal deficits. Psych: Normal affect. Responds appropriately.   Assessment:    No diagnosis found.  Plan:     Disposition: Follow up in ***   Signed, Aline FORBES Door, PA-C  01/07/2025 10:58 AM    [facility] "

## 2025-01-13 DIAGNOSIS — I4819 Other persistent atrial fibrillation: Secondary | ICD-10-CM | POA: Diagnosis not present

## 2025-01-16 ENCOUNTER — Telehealth: Payer: Self-pay | Admitting: Cardiology

## 2025-01-16 ENCOUNTER — Ambulatory Visit: Admitting: Student

## 2025-01-16 ENCOUNTER — Ambulatory Visit: Admitting: Cardiology

## 2025-01-16 NOTE — Telephone Encounter (Signed)
 Pt requesting a c/b for sooner appt than rescheduled for if possible. Please advise

## 2025-01-17 ENCOUNTER — Ambulatory Visit: Payer: Self-pay | Admitting: Cardiology

## 2025-01-20 ENCOUNTER — Telehealth: Payer: Self-pay | Admitting: Cardiology

## 2025-01-20 NOTE — Telephone Encounter (Signed)
" °*  STAT* If patient is at the pharmacy, call can be transferred to refill team.   1. Which medications need to be refilled? (please list name of each medication and dose if known) amiodarone  (PACERONE ) 200 MG tablet  diltiazem  (CARDIZEM  CD) 120 MG 24 hr capsule  hydrALAZINE  (APRESOLINE ) 25 MG tablet    2. Would you like to learn more about the convenience, safety, & potential cost savings by using the [facility]?     3. Are you open to using the [facility] (Type [facility].  ).   4. Which pharmacy/location (including street and city if local pharmacy) is medication to be sent to?  [facility] - [place], [place] - [address]     5. Do they need a 30 day or 90 day supply? 90 day  "

## 2025-01-23 ENCOUNTER — Ambulatory Visit

## 2025-01-23 MED ORDER — DILTIAZEM HCL ER COATED BEADS 120 MG PO CP24: 120.0000 mg | ORAL_CAPSULE | Freq: Every day | ORAL | 1 refills | Status: AC

## 2025-01-23 MED ORDER — HYDRALAZINE HCL 25 MG PO TABS: 25.0000 mg | ORAL_TABLET | Freq: Three times a day (TID) | ORAL | 1 refills | Status: AC

## 2025-01-23 MED ORDER — AMIODARONE HCL 200 MG PO TABS: 200.0000 mg | ORAL_TABLET | Freq: Every day | ORAL | 2 refills | Status: AC

## 2025-01-23 NOTE — Telephone Encounter (Signed)
 Hydralazine  refill has been sent.  Will forward to the A-fib clinic for the Diltiazem  and Amiodarone .

## 2025-01-23 NOTE — Telephone Encounter (Signed)
 Refill sent

## 2025-01-24 ENCOUNTER — Other Ambulatory Visit

## 2025-01-31 ENCOUNTER — Ambulatory Visit

## 2025-02-02 ENCOUNTER — Other Ambulatory Visit

## 2025-02-07 ENCOUNTER — Ambulatory Visit: Admitting: General Practice

## 2025-02-09 ENCOUNTER — Ambulatory Visit: Admitting: Pulmonary Disease

## 2025-02-28 ENCOUNTER — Ambulatory Visit: Admitting: Cardiology

## 2025-03-03 ENCOUNTER — Ambulatory Visit: Admitting: Cardiology

## 2025-03-06 ENCOUNTER — Ambulatory Visit: Admitting: Pharmacist Clinician (PhC)/ Clinical Pharmacy Specialist
